# Patient Record
Sex: Female | Born: 1980 | Hispanic: Yes | Marital: Married | Smoking: Never smoker
Health system: Southern US, Community
[De-identification: ages and names within clinical notes are randomized; demographics above are authoritative.]

## PROBLEM LIST (undated history)

## (undated) ENCOUNTER — Inpatient Hospital Stay (HOSPITAL_COMMUNITY): Payer: Self-pay

## (undated) DIAGNOSIS — N926 Irregular menstruation, unspecified: Secondary | ICD-10-CM

## (undated) DIAGNOSIS — D649 Anemia, unspecified: Secondary | ICD-10-CM

## (undated) DIAGNOSIS — G510 Bell's palsy: Secondary | ICD-10-CM

## (undated) HISTORY — DX: Bell's palsy: G51.0

## (undated) HISTORY — DX: Irregular menstruation, unspecified: N92.6

## (undated) HISTORY — DX: Anemia, unspecified: D64.9

## (undated) HISTORY — PX: TOOTH EXTRACTION: SUR596

---

## 2004-08-29 ENCOUNTER — Inpatient Hospital Stay (HOSPITAL_COMMUNITY): Admission: AD | Admit: 2004-08-29 | Discharge: 2004-08-29 | Payer: Self-pay | Admitting: *Deleted

## 2004-09-17 ENCOUNTER — Ambulatory Visit: Payer: Self-pay | Admitting: Obstetrics & Gynecology

## 2005-04-21 ENCOUNTER — Ambulatory Visit: Payer: Self-pay | Admitting: Nurse Practitioner

## 2005-04-22 ENCOUNTER — Ambulatory Visit: Payer: Self-pay | Admitting: Nurse Practitioner

## 2005-04-23 ENCOUNTER — Encounter (INDEPENDENT_AMBULATORY_CARE_PROVIDER_SITE_OTHER): Payer: Self-pay | Admitting: Nurse Practitioner

## 2005-04-23 LAB — CONVERTED CEMR LAB
TSH: NORMAL microintl units/mL
WBC, blood: NORMAL 10*3/uL

## 2005-04-28 ENCOUNTER — Ambulatory Visit: Payer: Self-pay | Admitting: Nurse Practitioner

## 2005-05-01 ENCOUNTER — Encounter: Admission: RE | Admit: 2005-05-01 | Discharge: 2005-05-01 | Payer: Self-pay | Admitting: Family Medicine

## 2005-06-10 ENCOUNTER — Ambulatory Visit: Payer: Self-pay | Admitting: Nurse Practitioner

## 2005-06-18 ENCOUNTER — Ambulatory Visit: Payer: Self-pay | Admitting: *Deleted

## 2005-06-24 ENCOUNTER — Ambulatory Visit: Payer: Self-pay | Admitting: Nurse Practitioner

## 2005-07-14 ENCOUNTER — Ambulatory Visit: Payer: Self-pay | Admitting: Nurse Practitioner

## 2005-09-12 ENCOUNTER — Ambulatory Visit: Payer: Self-pay | Admitting: Nurse Practitioner

## 2005-09-16 ENCOUNTER — Ambulatory Visit (HOSPITAL_COMMUNITY): Admission: RE | Admit: 2005-09-16 | Discharge: 2005-09-16 | Payer: Self-pay | Admitting: Internal Medicine

## 2005-09-25 ENCOUNTER — Ambulatory Visit: Payer: Self-pay | Admitting: Nurse Practitioner

## 2005-11-04 ENCOUNTER — Ambulatory Visit: Payer: Self-pay | Admitting: Internal Medicine

## 2005-11-27 ENCOUNTER — Ambulatory Visit: Payer: Self-pay | Admitting: Nurse Practitioner

## 2005-12-31 ENCOUNTER — Ambulatory Visit: Payer: Self-pay | Admitting: Nurse Practitioner

## 2006-02-24 ENCOUNTER — Ambulatory Visit: Payer: Self-pay | Admitting: Family Medicine

## 2006-03-10 ENCOUNTER — Ambulatory Visit: Payer: Self-pay | Admitting: Family Medicine

## 2006-03-20 ENCOUNTER — Inpatient Hospital Stay (HOSPITAL_COMMUNITY): Admission: AD | Admit: 2006-03-20 | Discharge: 2006-03-20 | Payer: Self-pay | Admitting: Gynecology

## 2006-03-26 ENCOUNTER — Ambulatory Visit: Payer: Self-pay | Admitting: Nurse Practitioner

## 2006-04-26 ENCOUNTER — Inpatient Hospital Stay (HOSPITAL_COMMUNITY): Admission: AD | Admit: 2006-04-26 | Discharge: 2006-04-26 | Payer: Self-pay | Admitting: Obstetrics

## 2006-05-21 ENCOUNTER — Ambulatory Visit: Payer: Self-pay | Admitting: Nurse Practitioner

## 2006-09-01 ENCOUNTER — Ambulatory Visit: Payer: Self-pay | Admitting: Nurse Practitioner

## 2006-09-01 LAB — CONVERTED CEMR LAB: Urinalysis: NORMAL

## 2006-10-19 ENCOUNTER — Ambulatory Visit: Payer: Self-pay | Admitting: Internal Medicine

## 2006-12-15 ENCOUNTER — Encounter (INDEPENDENT_AMBULATORY_CARE_PROVIDER_SITE_OTHER): Payer: Self-pay | Admitting: Nurse Practitioner

## 2006-12-15 DIAGNOSIS — E781 Pure hyperglyceridemia: Secondary | ICD-10-CM

## 2007-01-27 ENCOUNTER — Encounter (INDEPENDENT_AMBULATORY_CARE_PROVIDER_SITE_OTHER): Payer: Self-pay | Admitting: *Deleted

## 2007-06-25 ENCOUNTER — Inpatient Hospital Stay (HOSPITAL_COMMUNITY): Admission: AD | Admit: 2007-06-25 | Discharge: 2007-06-25 | Payer: Self-pay | Admitting: Obstetrics

## 2007-06-26 ENCOUNTER — Inpatient Hospital Stay (HOSPITAL_COMMUNITY): Admission: AD | Admit: 2007-06-26 | Discharge: 2007-06-29 | Payer: Self-pay | Admitting: Obstetrics

## 2007-10-21 ENCOUNTER — Ambulatory Visit: Payer: Self-pay | Admitting: Internal Medicine

## 2007-12-13 ENCOUNTER — Ambulatory Visit: Payer: Self-pay | Admitting: Internal Medicine

## 2008-02-03 ENCOUNTER — Encounter: Payer: Self-pay | Admitting: Family Medicine

## 2008-02-03 ENCOUNTER — Ambulatory Visit: Payer: Self-pay | Admitting: Family Medicine

## 2008-02-03 LAB — CONVERTED CEMR LAB: Chlamydia, DNA Probe: NEGATIVE

## 2008-06-28 ENCOUNTER — Ambulatory Visit: Payer: Self-pay | Admitting: Internal Medicine

## 2008-08-02 ENCOUNTER — Encounter: Admission: RE | Admit: 2008-08-02 | Discharge: 2008-08-21 | Payer: Self-pay | Admitting: Family Medicine

## 2008-09-08 ENCOUNTER — Ambulatory Visit: Payer: Self-pay | Admitting: Internal Medicine

## 2008-11-10 ENCOUNTER — Ambulatory Visit: Payer: Self-pay | Admitting: Internal Medicine

## 2009-01-01 ENCOUNTER — Ambulatory Visit: Payer: Self-pay | Admitting: Internal Medicine

## 2009-08-10 ENCOUNTER — Ambulatory Visit: Payer: Self-pay | Admitting: Family

## 2009-08-10 ENCOUNTER — Inpatient Hospital Stay (HOSPITAL_COMMUNITY): Admission: AD | Admit: 2009-08-10 | Discharge: 2009-08-13 | Payer: Self-pay | Admitting: Obstetrics

## 2009-08-22 ENCOUNTER — Ambulatory Visit: Admission: RE | Admit: 2009-08-22 | Discharge: 2009-08-22 | Payer: Self-pay | Admitting: Obstetrics

## 2009-10-05 ENCOUNTER — Emergency Department (HOSPITAL_COMMUNITY): Admission: EM | Admit: 2009-10-05 | Discharge: 2009-10-05 | Payer: Self-pay | Admitting: Emergency Medicine

## 2010-07-14 ENCOUNTER — Emergency Department (HOSPITAL_COMMUNITY)
Admission: EM | Admit: 2010-07-14 | Discharge: 2010-07-14 | Disposition: A | Discharge status: Home / Self Care | Payer: Self-pay | Attending: Emergency Medicine | Admitting: Emergency Medicine

## 2010-07-14 DIAGNOSIS — R3915 Urgency of urination: Secondary | ICD-10-CM | POA: Insufficient documentation

## 2010-07-14 DIAGNOSIS — J40 Bronchitis, not specified as acute or chronic: Secondary | ICD-10-CM | POA: Insufficient documentation

## 2010-07-14 DIAGNOSIS — R059 Cough, unspecified: Secondary | ICD-10-CM | POA: Insufficient documentation

## 2010-07-14 DIAGNOSIS — R0982 Postnasal drip: Secondary | ICD-10-CM | POA: Insufficient documentation

## 2010-07-14 DIAGNOSIS — R000 Tachycardia, unspecified: Secondary | ICD-10-CM | POA: Insufficient documentation

## 2010-07-14 DIAGNOSIS — R509 Fever, unspecified: Secondary | ICD-10-CM | POA: Insufficient documentation

## 2010-07-14 DIAGNOSIS — R51 Headache: Secondary | ICD-10-CM | POA: Insufficient documentation

## 2010-07-14 DIAGNOSIS — R5383 Other fatigue: Secondary | ICD-10-CM | POA: Insufficient documentation

## 2010-07-14 DIAGNOSIS — J029 Acute pharyngitis, unspecified: Secondary | ICD-10-CM | POA: Insufficient documentation

## 2010-07-14 DIAGNOSIS — R109 Unspecified abdominal pain: Secondary | ICD-10-CM | POA: Insufficient documentation

## 2010-07-14 DIAGNOSIS — R05 Cough: Secondary | ICD-10-CM | POA: Insufficient documentation

## 2010-07-14 DIAGNOSIS — J3489 Other specified disorders of nose and nasal sinuses: Secondary | ICD-10-CM | POA: Insufficient documentation

## 2010-07-14 DIAGNOSIS — R5381 Other malaise: Secondary | ICD-10-CM | POA: Insufficient documentation

## 2010-07-14 DIAGNOSIS — R300 Dysuria: Secondary | ICD-10-CM | POA: Insufficient documentation

## 2010-07-14 DIAGNOSIS — R197 Diarrhea, unspecified: Secondary | ICD-10-CM | POA: Insufficient documentation

## 2010-07-14 LAB — URINALYSIS, ROUTINE W REFLEX MICROSCOPIC
Bilirubin Urine: NEGATIVE
Hgb urine dipstick: NEGATIVE
Protein, ur: NEGATIVE mg/dL
Specific Gravity, Urine: 1.014 (ref 1.005–1.030)
Urobilinogen, UA: 0.2 mg/dL (ref 0.0–1.0)

## 2010-07-14 LAB — URINE MICROSCOPIC-ADD ON

## 2010-07-29 LAB — POCT I-STAT, CHEM 8
HCT: 35 % — ABNORMAL LOW (ref 36.0–46.0)
Hemoglobin: 11.9 g/dL — ABNORMAL LOW (ref 12.0–15.0)
Potassium: 3.4 mEq/L — ABNORMAL LOW (ref 3.5–5.1)
Sodium: 141 mEq/L (ref 135–145)

## 2010-07-29 LAB — DIFFERENTIAL
Basophils Absolute: 0 10*3/uL (ref 0.0–0.1)
Basophils Relative: 0 % (ref 0–1)
Monocytes Relative: 7 % (ref 3–12)
Neutro Abs: 5.6 10*3/uL (ref 1.7–7.7)
Neutrophils Relative %: 62 % (ref 43–77)

## 2010-07-29 LAB — URINALYSIS, ROUTINE W REFLEX MICROSCOPIC
Bilirubin Urine: NEGATIVE
Nitrite: NEGATIVE
Specific Gravity, Urine: 1.012 (ref 1.005–1.030)
pH: 6 (ref 5.0–8.0)

## 2010-07-29 LAB — CBC
MCHC: 34.3 g/dL (ref 30.0–36.0)
Platelets: 281 10*3/uL (ref 150–400)
RBC: 4.15 MIL/uL (ref 3.87–5.11)
RDW: 16.5 % — ABNORMAL HIGH (ref 11.5–15.5)

## 2010-07-29 LAB — URINE MICROSCOPIC-ADD ON

## 2010-07-29 LAB — POCT PREGNANCY, URINE: Preg Test, Ur: NEGATIVE

## 2010-07-31 LAB — CBC
HCT: 28.3 % — ABNORMAL LOW (ref 36.0–46.0)
Hemoglobin: 11 g/dL — ABNORMAL LOW (ref 12.0–15.0)
MCV: 82.9 fL (ref 78.0–100.0)
RBC: 3.42 MIL/uL — ABNORMAL LOW (ref 3.87–5.11)
RBC: 4.02 MIL/uL (ref 3.87–5.11)
WBC: 12.1 10*3/uL — ABNORMAL HIGH (ref 4.0–10.5)
WBC: 12.5 10*3/uL — ABNORMAL HIGH (ref 4.0–10.5)

## 2010-07-31 LAB — RPR: RPR Ser Ql: NONREACTIVE

## 2010-08-05 ENCOUNTER — Encounter (INDEPENDENT_AMBULATORY_CARE_PROVIDER_SITE_OTHER): Payer: Self-pay | Admitting: *Deleted

## 2010-08-05 LAB — CONVERTED CEMR LAB
AST: 59 units/L — ABNORMAL HIGH (ref 0–37)
BUN: 9 mg/dL (ref 6–23)
Calcium: 8.6 mg/dL (ref 8.4–10.5)
Chloride: 109 meq/L (ref 96–112)
Creatinine, Ser: 0.46 mg/dL (ref 0.40–1.20)
Total Bilirubin: 0.4 mg/dL (ref 0.3–1.2)

## 2010-08-08 ENCOUNTER — Encounter: Payer: Self-pay | Admitting: Internal Medicine

## 2010-08-10 ENCOUNTER — Emergency Department (HOSPITAL_COMMUNITY)
Admission: EM | Admit: 2010-08-10 | Discharge: 2010-08-11 | Disposition: A | Discharge status: Home / Self Care | Payer: Self-pay | Attending: Emergency Medicine | Admitting: Emergency Medicine

## 2010-08-10 DIAGNOSIS — R109 Unspecified abdominal pain: Secondary | ICD-10-CM | POA: Insufficient documentation

## 2010-08-10 DIAGNOSIS — R110 Nausea: Secondary | ICD-10-CM | POA: Insufficient documentation

## 2010-08-10 DIAGNOSIS — E669 Obesity, unspecified: Secondary | ICD-10-CM | POA: Insufficient documentation

## 2010-08-10 DIAGNOSIS — R197 Diarrhea, unspecified: Secondary | ICD-10-CM | POA: Insufficient documentation

## 2010-08-10 LAB — URINALYSIS, ROUTINE W REFLEX MICROSCOPIC
Bilirubin Urine: NEGATIVE
Hgb urine dipstick: NEGATIVE
Nitrite: NEGATIVE
Specific Gravity, Urine: 1.025 (ref 1.005–1.030)
pH: 6.5 (ref 5.0–8.0)

## 2010-08-10 LAB — URINE MICROSCOPIC-ADD ON

## 2010-08-10 LAB — DIFFERENTIAL
Eosinophils Absolute: 0.4 10*3/uL (ref 0.0–0.7)
Eosinophils Relative: 4 % (ref 0–5)
Lymphocytes Relative: 30 % (ref 12–46)
Lymphs Abs: 3.4 10*3/uL (ref 0.7–4.0)
Monocytes Absolute: 0.7 10*3/uL (ref 0.1–1.0)
Monocytes Relative: 6 % (ref 3–12)

## 2010-08-11 LAB — CBC
HCT: 36.8 % (ref 36.0–46.0)
MCH: 27.8 pg (ref 26.0–34.0)
MCHC: 33.4 g/dL (ref 30.0–36.0)
MCV: 83.3 fL (ref 78.0–100.0)
Platelets: 264 10*3/uL (ref 150–400)
RDW: 13.8 % (ref 11.5–15.5)
WBC: 11.3 10*3/uL — ABNORMAL HIGH (ref 4.0–10.5)

## 2010-08-11 LAB — POCT I-STAT, CHEM 8
Calcium, Ion: 1.09 mmol/L — ABNORMAL LOW (ref 1.12–1.32)
Chloride: 106 mEq/L (ref 96–112)
Creatinine, Ser: 0.5 mg/dL (ref 0.4–1.2)
Glucose, Bld: 94 mg/dL (ref 70–99)
Potassium: 3.5 mEq/L (ref 3.5–5.1)

## 2010-09-27 NOTE — Group Therapy Note (Signed)
NAME:  Kari Ortega, Kari Ortega NO.:  [id number]   MEDICAL RECORD NO.:  [id number]          PATIENT TYPE:  WOC   LOCATION:  [facility]                   FACILITY:  [facility]   PHYSICIAN:  Elsie Lincoln, MD      DATE OF BIRTH:  21-Oct-1980   DATE OF SERVICE:  09/17/2004                                    CLINIC NOTE   REASON FOR VISIT:  The patient is a 30 year old G0 with LMP April 04, 2004 who presents for follow-up of MAU visit. The patient was there mostly  for a viral syndrome and complaining of breast discharge. They happened to  diagnosis that she had not had a period since April 04, 2004 although she  had received Depo on April 05, 2004. They did do a transvaginal  ultrasound due to some abdominal pain which showed evidence of PCO. The  endometrial stripe was thin, all labs were normal, she was not pregnant.  Today the patient complains of daily nipple discharge that is white, looking  like breast milk. She also complains of various and sundry other complaints  including headache, dizziness, a cough last week, and sharp back pain last  night that radiated to her legs. We will workup for a prolactinoma but all  these other general medical complaints she was instructed to find a general  medicine physician to handle these. The patient states that her menses have  always been irregular, every 1 to 2-and-a-half months since menarche at age  78. However, she has been amenorrheic as stated above since the Depo-Provera  April 05, 2004. She is sexually active with her husband and does not use  contraception and desires pregnancy.   PAST MEDICAL HISTORY:  Denies.   PAST SURGICAL HISTORY:  Age 28 had a mini-laparotomy due to a blood clot  being in her belly. This happened in [place] so we are unsure exactly what  happened.   PAST GYNECOLOGICAL HISTORY:  Has never had a Pap smear and denies any other  problems except as above.   FAMILY HISTORY:  No cancers.   PHYSICAL EXAMINATION:  GENERAL:  Well-nourished, well-developed, no apparent  distress.  VITAL SIGNS:  Temperature 97.9, pulse 70, blood pressure 114/78, weight  128.5.  BREASTS:  Positive discharge on deep palpation, no masses.  ABDOMEN:  Soft, nontender, slightly obese.  HEENT:  Positive hirsutism above upper lip.  PELVIC:  Genitalia:  Tanner V. Vagina:  Pink, no lesions. Cervix:  Nulliparous, closed, nontender. Uterus:  Retroverted, nontender. Adnexa:  Ovaries palpated, nontender.   ASSESSMENT AND PLAN:  A 30 year old female with amenorrhea and  oligomenorrhea.   1.  UPT today.  2.  Fasting 17-hydroxyprogesterone and glucose.  3.  Prolactin and TSH levels.  4.  Pap smear and cultures today.  5.  The patient encouraged to take a multivitamin with 400 mcg of folic acid      daily.  6.  Return to clinic in a month for results.      KL/MEDQ  D:  09/17/2004  T:  09/17/2004  Job:  [id number]

## 2010-10-25 ENCOUNTER — Other Ambulatory Visit: Payer: Self-pay | Admitting: Family Medicine

## 2011-01-31 LAB — CBC
HCT: 24.1 — ABNORMAL LOW
HCT: 32.9 — ABNORMAL LOW
Hemoglobin: 11.4 — ABNORMAL LOW
MCHC: 34.4
MCHC: 34.6
MCV: 83.6
Platelets: 303
RBC: 2.89 — ABNORMAL LOW
RBC: 3.98
WBC: 15.3 — ABNORMAL HIGH

## 2016-07-01 ENCOUNTER — Inpatient Hospital Stay (HOSPITAL_COMMUNITY)
Admission: AD | Admit: 2016-07-01 | Discharge: 2016-07-01 | Disposition: A | Discharge status: Home / Self Care | Payer: Self-pay | Source: Ambulatory Visit | Attending: Family Medicine | Admitting: Family Medicine

## 2016-07-01 ENCOUNTER — Encounter (HOSPITAL_COMMUNITY): Payer: Self-pay

## 2016-07-01 ENCOUNTER — Inpatient Hospital Stay (HOSPITAL_COMMUNITY): Payer: Self-pay

## 2016-07-01 DIAGNOSIS — O26851 Spotting complicating pregnancy, first trimester: Secondary | ICD-10-CM

## 2016-07-01 DIAGNOSIS — O209 Hemorrhage in early pregnancy, unspecified: Secondary | ICD-10-CM | POA: Insufficient documentation

## 2016-07-01 DIAGNOSIS — R1032 Left lower quadrant pain: Secondary | ICD-10-CM

## 2016-07-01 DIAGNOSIS — O3680X0 Pregnancy with inconclusive fetal viability, not applicable or unspecified: Secondary | ICD-10-CM

## 2016-07-01 DIAGNOSIS — Z3A10 10 weeks gestation of pregnancy: Secondary | ICD-10-CM | POA: Insufficient documentation

## 2016-07-01 LAB — CBC WITH DIFFERENTIAL/PLATELET
Basophils Absolute: 0 10*3/uL (ref 0.0–0.1)
Basophils Relative: 0 %
EOS PCT: 2 %
Eosinophils Absolute: 0.2 10*3/uL (ref 0.0–0.7)
HCT: 35.6 % — ABNORMAL LOW (ref 36.0–46.0)
Hemoglobin: 12.1 g/dL (ref 12.0–15.0)
LYMPHS ABS: 2.7 10*3/uL (ref 0.7–4.0)
LYMPHS PCT: 25 %
MCH: 27.9 pg (ref 26.0–34.0)
MCHC: 34 g/dL (ref 30.0–36.0)
MCV: 82.2 fL (ref 78.0–100.0)
MONO ABS: 0.5 10*3/uL (ref 0.1–1.0)
MONOS PCT: 4 %
Neutro Abs: 7.6 10*3/uL (ref 1.7–7.7)
Neutrophils Relative %: 69 %
PLATELETS: 290 10*3/uL (ref 150–400)
RBC: 4.33 MIL/uL (ref 3.87–5.11)
RDW: 14.5 % (ref 11.5–15.5)
WBC: 11 10*3/uL — ABNORMAL HIGH (ref 4.0–10.5)

## 2016-07-01 LAB — WET PREP, GENITAL
Clue Cells Wet Prep HPF POC: NONE SEEN
SPERM: NONE SEEN
TRICH WET PREP: NONE SEEN
YEAST WET PREP: NONE SEEN

## 2016-07-01 LAB — ABO/RH: ABO/RH(D): O POS

## 2016-07-01 LAB — HCG, QUANTITATIVE, PREGNANCY: hCG, Beta Chain, Quant, S: 15479 m[IU]/mL — ABNORMAL HIGH (ref <5)

## 2016-07-01 LAB — POCT PREGNANCY, URINE: PREG TEST UR: POSITIVE — AB

## 2016-07-01 NOTE — MAU Note (Signed)
Pt presents to MAU with vaginal bleeding, lower abdominal and lower back pain that started Sunday. PT notices bleeding with wiping and "very small clots." Denies any urinary symptoms.

## 2016-07-01 NOTE — MAU Provider Note (Signed)
History     CSN: [id number]  Arrival date and time: 07/01/16 [id number]   First Provider Initiated Contact with Patient 07/01/16 [phone]      Chief Complaint  Patient presents with  . Vaginal Bleeding   HPI Ms. Kari Ortega is a 36 y.o. G4P0 at [redacted]w[redacted]d who presents to MAU today with complaint of vaginal bleeding since Sunday. The patient states only noted with wiping, she has not needed to wear a pad. She also has LLQ abdominal pain and low back pain rated at 6/10 now. She has tried Tylenol for pain. She denies vaginal discharge, N/V/D, constipation or UTI symptoms.   OB History    Gravida Para Term Preterm AB Living   4         2   SAB TAB Ectopic Multiple Live Births                  Past Medical History:  Diagnosis Date  . Medical history non-contributory     Past Surgical History:  Procedure Laterality Date  . NO PAST SURGERIES      No family history on file.  Social History  Substance Use Topics  . Smoking status: Never Smoker  . Smokeless tobacco: Never Used  . Alcohol use No    Allergies: Allergies not on file  No prescriptions prior to admission.    Review of Systems  Constitutional: Negative for fever.  Gastrointestinal: Positive for abdominal pain. Negative for constipation, diarrhea, nausea and vomiting.  Genitourinary: Positive for vaginal bleeding. Negative for dysuria, frequency, urgency and vaginal discharge.   Physical Exam   Blood pressure 103/60, pulse 71, temperature 98.7 F (37.1 C), resp. rate 18, last menstrual period 04/22/2016.  Physical Exam  Nursing note and vitals reviewed. Constitutional: She is oriented to person, place, and time. She appears well-developed and well-nourished. No distress.  HENT:  Head: Normocephalic and atraumatic.  Cardiovascular: Normal rate.   Respiratory: Effort normal.  GI: Soft. She exhibits no distension and no mass. There is tenderness (mild tenderness to palpation of the LLQ). There is no  rebound and no guarding.  Genitourinary: Uterus is enlarged (slightly). Uterus is not tender. Cervix exhibits no motion tenderness, no discharge and no friability. Right adnexum displays no mass and no tenderness. Left adnexum displays no mass and no tenderness. There is bleeding (scant) in the vagina. No vaginal discharge found.  Neurological: She is alert and oriented to person, place, and time.  Skin: Skin is warm and dry. No erythema.  Psychiatric: She has a normal mood and affect.    Results for orders placed or performed during the hospital encounter of 07/01/16 (from the past 24 hour(s))  Pregnancy, urine POC     Status: Abnormal   Collection Time: 07/01/16  9:13 AM  Result Value Ref Range   Preg Test, Ur POSITIVE (A) NEGATIVE  Wet prep, genital     Status: Abnormal   Collection Time: 07/01/16  9:40 AM  Result Value Ref Range   Yeast Wet Prep HPF POC NONE SEEN NONE SEEN   Trich, Wet Prep NONE SEEN NONE SEEN   Clue Cells Wet Prep HPF POC NONE SEEN NONE SEEN   WBC, Wet Prep HPF POC FEW (A) NONE SEEN   Sperm NONE SEEN   CBC with Differential/Platelet     Status: Abnormal   Collection Time: 07/01/16  9:48 AM  Result Value Ref Range   WBC 11.0 (H) 4.0 - 10.5 K/uL   RBC 4.33  3.87 - 5.11 MIL/uL   Hemoglobin 12.1 12.0 - 15.0 g/dL   HCT 35.6 (L) 36.0 - 46.0 %   MCV 82.2 78.0 - 100.0 fL   MCH 27.9 26.0 - 34.0 pg   MCHC 34.0 30.0 - 36.0 g/dL   RDW 14.5 11.5 - 15.5 %   Platelets 290 150 - 400 K/uL   Neutrophils Relative % 69 %   Neutro Abs 7.6 1.7 - 7.7 K/uL   Lymphocytes Relative 25 %   Lymphs Abs 2.7 0.7 - 4.0 K/uL   Monocytes Relative 4 %   Monocytes Absolute 0.5 0.1 - 1.0 K/uL   Eosinophils Relative 2 %   Eosinophils Absolute 0.2 0.0 - 0.7 K/uL   Basophils Relative 0 %   Basophils Absolute 0.0 0.0 - 0.1 K/uL  ABO/Rh     Status: None   Collection Time: 07/01/16  9:48 AM  Result Value Ref Range   ABO/RH(D) O POS   hCG, quantitative, pregnancy     Status: Abnormal    Collection Time: 07/01/16  9:48 AM  Result Value Ref Range   hCG, Beta Chain, Quant, S 15,479 (H) <5 mIU/mL   US Ob Comp Less 14 Wks  Result Date: 07/01/2016 CLINICAL DATA:  Vaginal bleeding.  First trimester pregnancy. EXAM: OBSTETRIC <14 WK [place] AND TRANSVAGINAL OB US TECHNIQUE: Both transabdominal and transvaginal ultrasound examinations were performed for complete evaluation of the gestation as well as the maternal uterus, adnexal regions, and pelvic cul-de-sac. Transvaginal technique was performed to assess early pregnancy. COMPARISON:  12/16/7 FINDINGS: Intrauterine gestational sac: Single Yolk sac:  Not Visualized. Embryo:  Not Visualized. Cardiac Activity: Not Visualized. MSD: 17.9  mm   6 w   4  d Subchorionic hemorrhage:  Small measuring 2.2 x 0.8 x 2.2 cm Maternal uterus/adnexae: Right ovary: Normal Left ovary: Normal Other :None Free fluid:  None IMPRESSION: 1. Probable early intrauterine gestational sac, but no yolk sac, fetal pole, or cardiac activity yet visualized. Recommend follow-up quantitative B-HCG levels and follow-up US in 14 days to assess viability. This recommendation follows SRU consensus guidelines: Diagnostic Criteria for Nonviable Pregnancy Early in the First Trimester. [place] Med [id number]. 2. Small subchorionic hemorrhage. Electronically Signed   By: Kerby Moors M.D.   On: 07/01/2016 11:16   US Ob Transvaginal  Result Date: 07/01/2016 CLINICAL DATA:  Vaginal bleeding.  First trimester pregnancy. EXAM: OBSTETRIC <14 WK [place] AND TRANSVAGINAL OB US TECHNIQUE: Both transabdominal and transvaginal ultrasound examinations were performed for complete evaluation of the gestation as well as the maternal uterus, adnexal regions, and pelvic cul-de-sac. Transvaginal technique was performed to assess early pregnancy. COMPARISON:  12/16/7 FINDINGS: Intrauterine gestational sac: Single Yolk sac:  Not Visualized. Embryo:  Not Visualized. Cardiac Activity: Not Visualized. MSD: 17.9   mm   6 w   4  d Subchorionic hemorrhage:  Small measuring 2.2 x 0.8 x 2.2 cm Maternal uterus/adnexae: Right ovary: Normal Left ovary: Normal Other :None Free fluid:  None IMPRESSION: 1. Probable early intrauterine gestational sac, but no yolk sac, fetal pole, or cardiac activity yet visualized. Recommend follow-up quantitative B-HCG levels and follow-up US in 14 days to assess viability. This recommendation follows SRU consensus guidelines: Diagnostic Criteria for Nonviable Pregnancy Early in the First Trimester. [place] Med [id number]. 2. Small subchorionic hemorrhage. Electronically Signed   By: Kerby Moors M.D.   On: 07/01/2016 11:16    MAU Course  Procedures None  MDM Unable to obtain  FHTs with doppler +UPT UA, wet prep, GC/chlamydia, CBC, ABO/Rh, quant hCG, HIV and [place] today to rule out ectopic pregnancy Based on [place] and lab results, this is likely a blighted ovum. Patient given strict precautions, but will follow-up Thursday for 48 repeat hCG.  Assessment and Plan  A: Pregnancy of unknown location Vaginal bleeding in pregnancy  P: Discharge home Bleeding/ectopic precautions discussed Patient advised to follow-up with [facility] at 11:00 am on Thursday for repeat labs Patient may return to MAU as needed or if her condition were to change or worsen  Luvenia Redden, PA-C  07/01/2016, 11:39 AM

## 2016-07-01 NOTE — Discharge Instructions (Signed)
Hemorragia vaginal durante el embarazo (primer trimestre) (Vaginal Bleeding During Pregnancy, First Trimester) Durante los primeros meses de Jansen, es comn tener una pequea hemorragia vaginal (manchas). A veces, la hemorragia es normal y no representa un problema, pero en algunas ocasiones es un sntoma de algo grave. Asegrese de decirle a su mdico de inmediato si tiene algn tipo de hemorragia vaginal. CUIDADOS EN EL HOGAR  Controle su afeccin para ver si hay cambios.  Siga las indicaciones de su mdico con respecto al [place] de actividad que [place].  Si debe hacer reposo en cama:  Es posible que deba quedarse en cama y levantarse nicamente para ir al bao.  Quizs le permitan hacer [employer].  Si es necesario, planifique que alguien la ayude.  Alla [place]:  La cantidad de toallas higinicas que [place] cada da.  La frecuencia con la que se cambia las toallas higinicas.  Indique que tan empapados (saturados) estn.  No use tampones.  No se haga duchas vaginales.  No tenga relaciones sexuales ni orgasmos hasta que el mdico la autorice.  Si elimina tejido por la vagina, gurdelo para mostrrselo al [employer].  Tome los medicamentos solamente como se lo haya indicado el mdico.  No tome aspirina, ya que puede causar hemorragias.  Concurra a todas las visitas de control como se lo haya indicado el mdico. SOLICITE AYUDA SI:  Tiene una hemorragia vaginal.  Tiene clicos.  Tiene dolores de [place].  Tiene fiebre que no desaparece despus de Geophysical data processor. SOLICITE AYUDA DE INMEDIATO SI:  Siente clicos muy intensos en la espalda o en el vientre (abdomen).  Elimina cogulos grandes o tejido por la vagina.  Tiene ms hemorragia.  Se siente dbil o que va a desvanecerse.  Pierde el conocimiento (se desmaya).  Tiene escalofros.  Tiene una prdida importante o sale lquido a borbotones por la vagina.  Se desmaya mientras defeca. ASEGRESE DE  QUE:  Comprende estas instrucciones.  Controlar su afeccin.  Recibir ayuda de inmediato si no mejora o si empeora. Esta informacin no tiene Marine scientist el consejo del mdico. Asegrese de hacerle al mdico cualquier pregunta que tenga. Document Released: 09/12/2013 Document Revised: 09/12/2013 Document Reviewed: 01/03/2013 Elsevier Interactive Patient Education  2017 [employer].

## 2016-07-01 NOTE — MAU Note (Signed)
Urine sent to lab 

## 2016-07-02 ENCOUNTER — Encounter (HOSPITAL_COMMUNITY): Payer: Self-pay | Admitting: *Deleted

## 2016-07-02 ENCOUNTER — Inpatient Hospital Stay (HOSPITAL_COMMUNITY)
Admission: AD | Admit: 2016-07-02 | Discharge: 2016-07-02 | Disposition: A | Discharge status: Home / Self Care | Payer: Self-pay | Source: Ambulatory Visit | Attending: Family Medicine | Admitting: Family Medicine

## 2016-07-02 DIAGNOSIS — Z3A10 10 weeks gestation of pregnancy: Secondary | ICD-10-CM | POA: Insufficient documentation

## 2016-07-02 DIAGNOSIS — O200 Threatened abortion: Secondary | ICD-10-CM | POA: Insufficient documentation

## 2016-07-02 LAB — URINALYSIS, ROUTINE W REFLEX MICROSCOPIC
Bacteria, UA: NONE SEEN
Bilirubin Urine: NEGATIVE
Glucose, UA: NEGATIVE mg/dL
KETONES UR: NEGATIVE mg/dL
NITRITE: NEGATIVE
PROTEIN: 100 mg/dL — AB
Specific Gravity, Urine: 1.024 (ref 1.005–1.030)
pH: 5 (ref 5.0–8.0)

## 2016-07-02 LAB — CBC
HCT: 35.4 % — ABNORMAL LOW (ref 36.0–46.0)
HEMOGLOBIN: 12 g/dL (ref 12.0–15.0)
MCH: 27.8 pg (ref 26.0–34.0)
MCHC: 33.9 g/dL (ref 30.0–36.0)
MCV: 82.1 fL (ref 78.0–100.0)
Platelets: 278 10*3/uL (ref 150–400)
RBC: 4.31 MIL/uL (ref 3.87–5.11)
RDW: 14.4 % (ref 11.5–15.5)
WBC: 12.7 10*3/uL — AB (ref 4.0–10.5)

## 2016-07-02 LAB — GC/CHLAMYDIA PROBE AMP (~~LOC~~) NOT AT ARMC
Chlamydia: NEGATIVE
Neisseria Gonorrhea: NEGATIVE

## 2016-07-02 LAB — HCG, QUANTITATIVE, PREGNANCY: hCG, Beta Chain, Quant, S: 13878 m[IU]/mL — ABNORMAL HIGH (ref <5)

## 2016-07-02 LAB — HIV ANTIBODY (ROUTINE TESTING W REFLEX): HIV SCREEN 4TH GENERATION: NONREACTIVE

## 2016-07-02 MED ORDER — OXYCODONE-ACETAMINOPHEN 5-325 MG PO TABS: 1.0000 | ORAL_TABLET | Freq: Four times a day (QID) | ORAL | 0 refills | Status: DC | PRN

## 2016-07-02 MED ORDER — OXYCODONE-ACETAMINOPHEN 5-325 MG PO TABS
2.0000 | ORAL_TABLET | Freq: Once | ORAL | Status: AC
  Administered 2016-07-02: 2 via ORAL
  Filled 2016-07-02: qty 2

## 2016-07-02 NOTE — MAU Provider Note (Signed)
History     CSN: [id number]  Arrival date and time: 07/02/16 0130   First Provider Initiated Contact with Patient 07/02/16 [phone]        Chief Complaint  Patient presents with  . Abdominal Pain  . Vaginal Bleeding   HPI Kari Ortega is a 36 y.o. [id number] at [redacted]w[redacted]d who presents with vaginal bleeding & abdominal pain. Was seen in MAU yesterday for vaginal bleeding. Ultrasound shows enlarge gestational sac without yolk sac; was told to f/u at [facility] Thursday morning for repeat BHCG.  Abdominal pain started at 9 pm. Describes lower abdominal cramping that she rates 9/10. Has taken tylenol without relief. Vaginal bleeding is dark red & notices when she wipes. Has not saturated pads. Passed a few small clots.    Spanish phone interpreter used.    OB History    Gravida Para Term Preterm AB Living   4 2 2   1 2    SAB TAB Ectopic Multiple Live Births   1       2      Past Medical History:  Diagnosis Date  . Medical history non-contributory     Past Surgical History:  Procedure Laterality Date  . NO PAST SURGERIES      History reviewed. No pertinent family history.  Social History  Substance Use Topics  . Smoking status: Never Smoker  . Smokeless tobacco: Never Used  . Alcohol use No    Allergies: No Known Allergies  No prescriptions prior to admission.    Review of Systems  Constitutional: Negative.   Gastrointestinal: Positive for abdominal pain. Negative for constipation, diarrhea, nausea and vomiting.  Genitourinary: Positive for vaginal bleeding.   Physical Exam   Blood pressure 106/61, pulse 79, temperature 98.7 F (37.1 C), temperature source Oral, resp. rate 18, height 4\' 10"  (1.473 m), weight 151 lb (68.5 kg), last menstrual period 04/22/2016.  Physical Exam  Nursing note and vitals reviewed. Constitutional: She is oriented to person, place, and time. She appears well-developed and well-nourished. No distress.  HENT:  Head: Normocephalic  and atraumatic.  Eyes: Conjunctivae are normal. Right eye exhibits no discharge. Left eye exhibits no discharge. No scleral icterus.  Neck: Normal range of motion.  Cardiovascular: Normal rate, regular rhythm and normal heart sounds.   No murmur heard. Respiratory: Effort normal and breath sounds normal. No respiratory distress. She has no wheezes.  GI: Soft. Bowel sounds are normal. She exhibits no distension. There is no tenderness. There is no rebound and no guarding.  Genitourinary: There is bleeding (small amount of dark red blood & 1 small clot; Cervic dilated 1 cm) in the vagina.  Neurological: She is alert and oriented to person, place, and time.  Skin: Skin is warm and dry. She is not diaphoretic.  Psychiatric: She has a normal mood and affect. Her behavior is normal. Judgment and thought content normal.    MAU Course  Procedures Results for orders placed or performed during the hospital encounter of 07/02/16 (from the past 24 hour(s))  Urinalysis, Routine w reflex microscopic     Status: Abnormal   Collection Time: 07/02/16  1:47 AM  Result Value Ref Range   Color, Urine AMBER (A) YELLOW   APPearance CLOUDY (A) CLEAR   Specific Gravity, Urine 1.024 1.005 - 1.030   pH 5.0 5.0 - 8.0   Glucose, UA NEGATIVE NEGATIVE mg/dL   Hgb urine dipstick LARGE (A) NEGATIVE   Bilirubin Urine NEGATIVE NEGATIVE   Ketones, ur  NEGATIVE NEGATIVE mg/dL   Protein, ur 100 (A) NEGATIVE mg/dL   Nitrite NEGATIVE NEGATIVE   Leukocytes, UA TRACE (A) NEGATIVE   RBC / HPF TOO NUMEROUS TO COUNT 0 - 5 RBC/hpf   WBC, UA TOO NUMEROUS TO COUNT 0 - 5 WBC/hpf   Bacteria, UA NONE SEEN NONE SEEN   Squamous Epithelial / LPF 0-5 (A) NONE SEEN   Non Squamous Epithelial 0-5 (A) NONE SEEN  CBC     Status: Abnormal   Collection Time: 07/02/16  2:14 AM  Result Value Ref Range   WBC 12.7 (H) 4.0 - 10.5 K/uL   RBC 4.31 3.87 - 5.11 MIL/uL   Hemoglobin 12.0 12.0 - 15.0 g/dL   HCT 35.4 (L) 36.0 - 46.0 %   MCV 82.1  78.0 - 100.0 fL   MCH 27.8 26.0 - 34.0 pg   MCHC 33.9 30.0 - 36.0 g/dL   RDW 14.4 11.5 - 15.5 %   Platelets 278 150 - 400 K/uL  hCG, quantitative, pregnancy     Status: Abnormal   Collection Time: 07/02/16  2:14 AM  Result Value Ref Range   hCG, Beta Chain, Quant, S 13,878 (H) <5 mIU/mL    MDM O positive CBC -- hemoglobin stable BHCG dropped from yesterday [phone] Discussed results with patient via Spanish video interpreter. This is likely a miscarriage d/t dropping BHCG & dilated cervix. Patient appropriately upset. Declines chaplain services at this time Pain improved after percocet 2 tabs  Assessment and Plan  A: 1. Threatened miscarriage    P: Discharge home Pelvic rest Rx percocet Discussed reasons to return to MAU Go to [facility] Friday morning for [place] 07/02/2016, 2:07 AM

## 2016-07-02 NOTE — MAU Note (Addendum)
PT  SAYS SHE   HAS  ABD  PAIN THAT  STARTED  9PM-    WITHH  VAG  BLEEDING   WHEN  SHE  WIPES  - STARTED ON Sunday  .      WAS HERE YESTERDAY  FOR  ABD   PAIN.         TOOK  1 TAB REG   TYLENOL  AT 3 PM

## 2016-07-02 NOTE — Discharge Instructions (Signed)
Amenaza de aborto °(Threatened Miscarriage) °La amenaza de aborto se produce cuando hay hemorragia vaginal durante las primeras 20 semanas de embarazo, pero el embarazo no se interrumpe. Si durante este período usted tiene hemorragia vaginal, el médico le hará pruebas para asegurarse de que el embarazo continúe. Si las pruebas muestran que usted continúa embarazada y que el "bebé" en desarrollo (feto) dentro del útero sigue creciendo, se considera que tuvo una amenaza de aborto. °La amenaza de aborto no implica que el embarazo vaya a terminar, pero sí aumenta el riesgo de perder el embarazo (aborto completo). °CAUSAS °Por lo general, no se conoce la causa de la amenaza de aborto. Si el resultado final es el aborto completo, la causa más frecuente es la cantidad anormal de cromosomas del feto. Los cromosomas son las estructuras internas de las células que contienen todo el material genético. °Algunas de las causas de hemorragia vaginal que no ocasionan un aborto incluyen: °· Las relaciones sexuales. °· Las infecciones. °· Los cambios hormonales normales durante el embarazo. °· La hemorragia que se produce cuando el óvulo se implanta en el útero. °FACTORES DE RIESGO °Los factores de riesgo de hemorragia al principio del embarazo incluyen: °· Obesidad. °· Fumar. °· El consumo de cantidades excesivas de alcohol o cafeína. °· El consumo de drogas. °SIGNOS Y SÍNTOMAS °· Hemorragia vaginal leve. °· Dolor o cólicos abdominales leves. °DIAGNÓSTICO °Si tiene hemorragia con o sin dolor abdominal antes de las 20 semanas de embarazo, el médico le hará pruebas para determinar si el embarazo continúa. Una prueba importante incluye el uso de ondas sonoras y de una computadora (ecografía) para crear imágenes del interior del útero. Otras pruebas incluyen el examen interno de la vagina y el útero (examen pélvico), y el control de la frecuencia cardíaca del feto. °Es posible que le diagnostiquen una amenaza de aborto en los siguientes  casos: °· La ecografía muestra que el embarazo continúa. °· La frecuencia cardíaca del feto es alta. °· El examen pélvico muestra que la apertura entre el útero y la vagina (cuello del útero) está cerrada. °· Su frecuencia cardíaca y su presión arterial están estables. °· Los análisis de sangre confirman que el embarazo continúa. °TRATAMIENTO °No se ha demostrado que ningún tratamiento evite que una amenaza de aborto se convierta en un aborto completo. Sin embargo, los cuidados adecuados en el hogar son importantes. °INSTRUCCIONES PARA EL CUIDADO EN EL HOGAR °· Asegúrese de asistir a todas las citas de cuidados prenatales. Esto es muy importante. °· Descanse lo suficiente. °· No tenga relaciones sexuales ni use tampones si tiene hemorragia vaginal. °· No se haga duchas vaginales. °· No fume ni consuma drogas. °· No beba alcohol. °· Evite la cafeína. °SOLICITE ATENCIÓN MÉDICA SI: °· Tiene una ligera hemorragia o manchado vaginal durante el embarazo. °· Tiene dolor o cólicos en el abdomen. °· Tiene fiebre. °SOLICITE ATENCIÓN MÉDICA DE INMEDIATO SI: °· Tiene una hemorragia vaginal abundante. °· Elimina coágulos de sangre por la vagina. °· Siente dolor en la parte baja de la espalda o cólicos abdominales intensos. °· Tiene fiebre, escalofríos y dolor abdominal intenso. °ASEGÚRESE DE QUE: °· Comprende estas instrucciones. °· Controlará su afección. °· Recibirá ayuda de inmediato si no mejora o si empeora. °Esta información no tiene como fin reemplazar el consejo del médico. Asegúrese de hacerle al médico cualquier pregunta que tenga. °Document Released: 02/05/2005 Document Revised: 05/03/2013 Document Reviewed: 02/22/2013 °Elsevier Interactive Patient Education © 2017 Elsevier Inc. ° °

## 2016-07-04 ENCOUNTER — Ambulatory Visit: Payer: Self-pay

## 2016-07-04 ENCOUNTER — Telehealth: Payer: Self-pay

## 2016-07-04 DIAGNOSIS — O3680X0 Pregnancy with inconclusive fetal viability, not applicable or unspecified: Secondary | ICD-10-CM

## 2016-07-04 LAB — HCG, QUANTITATIVE, PREGNANCY: hCG, Beta Chain, Quant, S: 2686 m[IU]/mL — ABNORMAL HIGH (ref <5)

## 2016-07-04 NOTE — Telephone Encounter (Signed)
Per Dr.Arnold patient needs a follow up appointment for Monday 07/07/2016 to discuss results. I have called patient and made her aware that our front office will be calling her regarding scheduling an appointment.

## 2016-07-04 NOTE — Progress Notes (Signed)
Patient presented to office today for a STAT BHCG. Patient reports some bleeding at this time. Patient was advised to wait for her results so that we can determine her next plan of care. Per Dr.Arnold patient needs a follow up in our office next week to discuss the next step. I have advised patient we will be calling her back for appointment on 07/07/2016. Patient voice understanding.

## 2016-07-07 ENCOUNTER — Telehealth: Payer: Self-pay

## 2016-07-07 ENCOUNTER — Ambulatory Visit (INDEPENDENT_AMBULATORY_CARE_PROVIDER_SITE_OTHER): Payer: Self-pay | Admitting: Family Medicine

## 2016-07-07 ENCOUNTER — Encounter: Payer: Self-pay | Admitting: Family Medicine

## 2016-07-07 VITALS — BP 124/80 | HR 66 | Ht <= 58 in | Wt 151.5 lb

## 2016-07-07 DIAGNOSIS — O039 Complete or unspecified spontaneous abortion without complication: Secondary | ICD-10-CM

## 2016-07-07 NOTE — Progress Notes (Signed)
   Subjective:    Patient ID: Kari Ortega, female    DOB: 1980-06-05, 36 y.o.   MRN: [id number]  HPI Patient seen for follow up of HCG. Her quants have decreased from 15000 to 2600. She reports passing clots and tissue a few days ago and yesterday. Spotting now. Still has a little bit of cramping. No fevers, chills, nausea, vomiting.   Review of Systems     Objective:   Physical Exam  Constitutional: She is oriented to person, place, and time. She appears well-developed and well-nourished.  Cardiovascular: Normal rate and regular rhythm.   Pulmonary/Chest: Effort normal and breath sounds normal. No respiratory distress. She has no wheezes. She has no rales.  Abdominal: Soft. There is tenderness (lower left quantrant tenderness). There is no rebound and no guarding.  Neurological: She is oriented to person, place, and time.  Psychiatric: She has a normal mood and affect. Her behavior is normal. Judgment and thought content normal.      Assessment & Plan:  1. Spontaneous miscarriage Will repeat quant. If sufficiently decreased, can discontinue.  - B-HCG Quant

## 2016-07-07 NOTE — Addendum Note (Signed)
Addended by: Christiana Pellant A on: 07/07/2016 02:38 PM   Modules accepted: Orders

## 2016-07-08 LAB — BETA HCG QUANT (REF LAB): HCG QUANT: 331 m[IU]/mL

## 2016-07-08 NOTE — Telephone Encounter (Signed)
Spoke with patient using spanish interpreter. She has ben notified of test results.

## 2016-07-08 NOTE — Telephone Encounter (Signed)
-----   Message from Truett Mainland, DO sent at 07/08/2016  9:31 AM EST ----- Sufficient drop in HCG. Follow up needed in 2 months if she doesn't get period.

## 2016-07-15 ENCOUNTER — Inpatient Hospital Stay (HOSPITAL_COMMUNITY)
Admission: AD | Admit: 2016-07-15 | Discharge: 2016-07-15 | Disposition: A | Discharge status: Home / Self Care | Payer: Self-pay | Source: Ambulatory Visit | Attending: Obstetrics & Gynecology | Admitting: Obstetrics & Gynecology

## 2016-07-15 ENCOUNTER — Encounter (HOSPITAL_COMMUNITY): Payer: Self-pay | Admitting: Advanced Practice Midwife

## 2016-07-15 ENCOUNTER — Inpatient Hospital Stay (HOSPITAL_COMMUNITY): Payer: Self-pay

## 2016-07-15 DIAGNOSIS — R109 Unspecified abdominal pain: Secondary | ICD-10-CM

## 2016-07-15 DIAGNOSIS — O039 Complete or unspecified spontaneous abortion without complication: Secondary | ICD-10-CM | POA: Insufficient documentation

## 2016-07-15 DIAGNOSIS — G44209 Tension-type headache, unspecified, not intractable: Secondary | ICD-10-CM | POA: Insufficient documentation

## 2016-07-15 LAB — URINALYSIS, ROUTINE W REFLEX MICROSCOPIC
Bilirubin Urine: NEGATIVE
Glucose, UA: NEGATIVE mg/dL
Hgb urine dipstick: NEGATIVE
Ketones, ur: NEGATIVE mg/dL
LEUKOCYTES UA: NEGATIVE
NITRITE: NEGATIVE
PH: 6 (ref 5.0–8.0)
PROTEIN: NEGATIVE mg/dL
Specific Gravity, Urine: 1.001 — ABNORMAL LOW (ref 1.005–1.030)

## 2016-07-15 LAB — CBC
HCT: 34.3 % — ABNORMAL LOW (ref 36.0–46.0)
HEMOGLOBIN: 11.4 g/dL — AB (ref 12.0–15.0)
MCH: 28 pg (ref 26.0–34.0)
MCHC: 33.2 g/dL (ref 30.0–36.0)
MCV: 84.3 fL (ref 78.0–100.0)
Platelets: 248 10*3/uL (ref 150–400)
RBC: 4.07 MIL/uL (ref 3.87–5.11)
RDW: 14 % (ref 11.5–15.5)
WBC: 11.9 10*3/uL — AB (ref 4.0–10.5)

## 2016-07-15 LAB — HCG, QUANTITATIVE, PREGNANCY: HCG, BETA CHAIN, QUANT, S: 12 m[IU]/mL — AB (ref <5)

## 2016-07-15 LAB — WET PREP, GENITAL
Clue Cells Wet Prep HPF POC: NONE SEEN
SPERM: NONE SEEN
TRICH WET PREP: NONE SEEN
Yeast Wet Prep HPF POC: NONE SEEN

## 2016-07-15 MED ORDER — IBUPROFEN 600 MG PO TABS: 600.0000 mg | ORAL_TABLET | Freq: Four times a day (QID) | ORAL | 1 refills | Status: DC | PRN

## 2016-07-15 MED ORDER — IBUPROFEN 600 MG PO TABS
600.0000 mg | ORAL_TABLET | Freq: Once | ORAL | Status: AC
  Administered 2016-07-15: 600 mg via ORAL
  Filled 2016-07-15: qty 1

## 2016-07-15 NOTE — MAU Note (Signed)
Pt presents complaining of lower abdominal pain and headaches for the last 2 weeks since her miscarriage. States she did her follow up like she was instructed but is still feeling bad. Took tylenol and it helps a little. Denies vaginal bleeding or discharge.

## 2016-07-15 NOTE — Discharge Instructions (Signed)
Cefalea tensional  (Tension Headache)  Una cefalea tensional es una sensación de dolor o presión que suele manifestarse en la frente y los lados de la cabeza. Este es el tipo más común de dolor de cabeza. El dolor puede ser sordo o puede sentirse que comprime (constrictivo). Generalmente, no se asocia con náuseas o vómitos y no empeora con la actividad física. Las cefaleas tensionales pueden durar de 30 minutos a varios días.  CAUSAS  Se desconoce la causa exacta de esta afección. Suelen comenzar después de una situación de estrés, ansiedad o por depresión. Otros factores desencadenantes pueden ser los siguientes:  · Alcohol.  · Demasiada cafeína o abstinencia de cafeína.  · Infecciones respiratorias, como resfriados, gripes o sinusitis.  · Problemas dentales o apretar los dientes.  · Fatiga.  · Mantener la cabeza y el cuello en la misma posición durante un período prolongado, por ejemplo, al usar la computadora.  · Fumar.  SÍNTOMAS  Los síntomas de esta afección incluyen lo siguiente:  · Sensación de presión alrededor de la cabeza.  · Dolor "sordo" en la cabeza.  · Dolor que siente sobre la frente y los lados de la cabeza.  · Dolor a la palpación en los músculos de la cabeza, del cuello y de los hombros.  DIAGNÓSTICO  Esta afección se puede diagnosticar en función de los síntomas y de un examen físico. Pueden hacerle estudios, como una tomografía computarizada o una resonancia magnética de la cabeza. Estos estudios se indican si los síntomas son graves o fuera de lo común.  TRATAMIENTO  Esta afección puede tratarse con cambios en el estilo de vida y medicamentos que lo ayuden a aliviar los síntomas.  INSTRUCCIONES PARA EL CUIDADO EN EL HOGAR  Control del dolor  · Tome los medicamentos de venta libre y los recetados solamente como se lo haya indicado el médico.  · Cuando sienta dolor de cabeza acuéstese en un cuarto oscuro y tranquilo.  · Si se lo indican, aplique hielo sobre la cabeza y la zona del cuello:   ? Ponga el hielo en una bolsa plástica.  ? Coloque una toalla entre la piel y la bolsa de hielo.  ? Coloque el hielo durante 20 minutos, 2 a 3 veces por día.  · Utilice una almohadilla térmica o tome una ducha con agua caliente para aplicar calor en la cabeza y la zona del cuello como se lo haya indicado el médico.  Comida y bebida  · Mantenga un horario para las comidas.  · Limite el consumo de bebidas alcohólicas.  · Disminuya el consumo de cafeína o deje de consumir cafeína.  Instrucciones generales  · Concurra a todas las visitas de control como se lo haya indicado el médico. Esto es importante.  · Lleve un diario de los dolores de cabeza para averiguar qué factores pueden desencadenarlos. Por ejemplo, escriba los siguientes datos:  ? Lo que usted come y bebe.  ? Cuánto tiempo duerme.  ? Algún cambio en su dieta o en los medicamentos.  · Pruebe algunas técnicas de relajación, como los masajes.  · Limite el estrés.  · Siéntese derecho y evite tensionar los músculos.  · No consuma productos que contengan tabaco, incluidos cigarrillos, tabaco de mascar o cigarrillos electrónicos. Si necesita ayuda para dejar de fumar, consulte al médico.  · Haga actividad física habitualmente como se lo haya indicado el médico.  · Duerma entre 7 y 9 horas o la cantidad de horas que le haya recomendado el médico.  SOLICITE ATENCIÓN   MÉDICA SI:  · Los medicamentos no logran aliviar los síntomas.  · Tiene un dolor de cabeza que es diferente del dolor de cabeza habitual.  · Tiene náuseas o vómitos.  · Tiene fiebre.  SOLICITE ATENCIÓN MÉDICA DE INMEDIATO SI:  · El dolor se hace cada vez más intenso.  · Ha vomitado repetidas veces.  · Presenta rigidez en el cuello.  · Sufre pérdida de la visión.  · Tiene problemas para hablar.  · Siente dolor en el ojo o en el oído.  · Presenta debilidad muscular o pérdida del control muscular.  · Pierde el equilibrio o tiene problemas para caminar.  · Sufre mareos o se desmaya.  · Se siente confundido.   Esta información no tiene como fin reemplazar el consejo del médico. Asegúrese de hacerle al médico cualquier pregunta que tenga.  Document Released: 02/05/2005 Document Revised: 01/17/2015  Elsevier Interactive Patient Education © 2017 [employer] Inc[employer]

## 2016-07-16 LAB — GC/CHLAMYDIA PROBE AMP (~~LOC~~) NOT AT ARMC
CHLAMYDIA, DNA PROBE: NEGATIVE
Neisseria Gonorrhea: NEGATIVE

## 2016-07-16 NOTE — MAU Provider Note (Signed)
Chief Complaint: No chief complaint on file.   First Provider Initiated Contact with Patient 07/15/16 2018     SUBJECTIVE HPI: Kari Ortega is  36 y.o. [id number] female Dx'd w/ SAB 2 weeks ago who is here for continued low-mid abd pain since SAB and HA x 5 days. Reports significant emotional distress and grief since the diagnosis of SAB. She is particularly upset about having to explain to her two young children that she experienced a miscarriage and she and her children cannot understand why God did this to her.  Results for Kari, BRAATEN Ortega (MRN [id number]) as of 07/16/2016 01:05  Ref. Range 07/01/2016 09:48 07/02/2016 02:14 07/04/2016 09:50  HCG, Beta Chain, Quant, S Latest Ref Range: <5 mIU/mL 15,479 (H) 13,878 (H) 2,686 (H)    Abd pain Location: Low-mid abd Quality: Pressure Severity: Moderate Duration: 2+weeks Context: Post-SAB Timing: intermittent Modifying factors: No improvement w/ Tylenol. No relationship to movement, eating, voiding or defecation. Associated signs and symptoms: Negative for fever, chills, vaginal bleeding, vaginal discharge, nausea, vomiting, diarrhea, constipation or urinary complaints.  Headache Location: Bilat  Quality: Pressure Severity: 8/10 on pain scale Duration: 5 days Context: Post-SAB. Timing: Constant Modifying factors: No improvement with Tylenol 500 mg every 8 hours. Associated signs and symptoms: Negative for fever, chills, vision changes, difficulties with speech or gait, sinus congestion.   Past Medical History:  Diagnosis Date  . Medical history non-contributory    OB History  Gravida Para Term Preterm AB Living  4 2 2   1 2   SAB TAB Ectopic Multiple Live Births  1       2    # Outcome Date GA Lbr Len/2nd Weight Sex Delivery Anes PTL Lv  4 Gravida           3 Term 2009    M Vag-Spont   LIV  2 SAB 2008          1 Term      Vag-Spont        Past Surgical History:  Procedure Laterality Date  . NO PAST  SURGERIES     Social History   Social History  . Marital status: Married    Spouse name: N/A  . Number of children: N/A  . Years of education: N/A   Occupational History  . Not on file.   Social History Main Topics  . Smoking status: Never Smoker  . Smokeless tobacco: Never Used  . Alcohol use No  . Drug use: No  . Sexual activity: Not on file   Other Topics Concern  . Not on file   Social History Narrative  . No narrative on file   History reviewed. No pertinent family history. No current facility-administered medications on file prior to encounter.    Current Outpatient Prescriptions on File Prior to Encounter  Medication Sig Dispense Refill  . oxyCODONE-acetaminophen (PERCOCET/ROXICET) 5-325 MG tablet Take 1-2 tablets by mouth every 6 (six) hours as needed. 15 tablet 0   No Known Allergies  I have reviewed patient's Past Medical Hx, Surgical Hx, Family Hx, Social Hx, medications and allergies.   Review of Systems  Constitutional: Positive for fatigue. Negative for appetite change, chills and fever.  HENT: Negative for congestion, sinus pain and sinus pressure.   Eyes: Negative for visual disturbance.  Gastrointestinal: Positive for abdominal distention and abdominal pain. Negative for blood in stool, constipation, diarrhea, nausea and vomiting.  Genitourinary: Negative for dysuria, flank pain, frequency, hematuria, pelvic pain, urgency, vaginal  bleeding, vaginal discharge and vaginal pain.  Musculoskeletal: Negative for back pain, gait problem, neck pain and neck stiffness.  Neurological: Positive for headaches. Negative for dizziness, syncope, speech difficulty and weakness.  Psychiatric/Behavioral: Positive for dysphoric mood. Negative for confusion.    OBJECTIVE Patient Vitals for the past 24 hrs:  BP Temp Pulse Resp Height Weight  07/15/16 2212 106/70 - 70 18 - -  07/15/16 1946 122/68 98 F (36.7 C) 90 18 4\' 8"  (1.422 m) 152 lb (68.9 kg)   Constitutional:  Well-developed, well-nourished female in no acute distress.  Cardiovascular: normal rate Respiratory: normal rate and effort.  Head: Atraumatic, sinuses nontender, no congestion, grossly normal vision. GI: Abd soft, Mild tenderness across entire low abdomen from suprapubic area up to umbilicus. Negative for guarding, rebound tenderness or palpable mass. Pos BS x 4 Neurologic: Alert and oriented x 4. Normal speech and gait. GU: Neg CVAT.  SPECULUM EXAM: NEFG, physiologic discharge, no blood noted, cervix with normal ectropion.  BIMANUAL: cervix closed; uterus normal size, mild left adnexal tenderness. No mass. No right adnexal tenderness or mass. No CMT.  LAB RESULTS Results for orders placed or performed during the hospital encounter of 07/15/16 (from the past 24 hour(s))  Urinalysis, Routine w reflex microscopic     Status: Abnormal   Collection Time: 07/15/16  7:43 PM  Result Value Ref Range   Color, Urine COLORLESS (A) YELLOW   APPearance CLEAR CLEAR   Specific Gravity, Urine 1.001 (L) 1.005 - 1.030   pH 6.0 5.0 - 8.0   Glucose, UA NEGATIVE NEGATIVE mg/dL   Hgb urine dipstick NEGATIVE NEGATIVE   Bilirubin Urine NEGATIVE NEGATIVE   Ketones, ur NEGATIVE NEGATIVE mg/dL   Protein, ur NEGATIVE NEGATIVE mg/dL   Nitrite NEGATIVE NEGATIVE   Leukocytes, UA NEGATIVE NEGATIVE  CBC     Status: Abnormal   Collection Time: 07/15/16  7:56 PM  Result Value Ref Range   WBC 11.9 (H) 4.0 - 10.5 K/uL   RBC 4.07 3.87 - 5.11 MIL/uL   Hemoglobin 11.4 (L) 12.0 - 15.0 g/dL   HCT 34.3 (L) 36.0 - 46.0 %   MCV 84.3 78.0 - 100.0 fL   MCH 28.0 26.0 - 34.0 pg   MCHC 33.2 30.0 - 36.0 g/dL   RDW 14.0 11.5 - 15.5 %   Platelets 248 150 - 400 K/uL  hCG, quantitative, pregnancy     Status: Abnormal   Collection Time: 07/15/16  7:56 PM  Result Value Ref Range   hCG, Beta Chain, Quant, S 12 (H) <5 mIU/mL  Wet prep, genital     Status: Abnormal   Collection Time: 07/15/16  8:25 PM  Result Value Ref Range    Yeast Wet Prep HPF POC NONE SEEN NONE SEEN   Trich, Wet Prep NONE SEEN NONE SEEN   Clue Cells Wet Prep HPF POC NONE SEEN NONE SEEN   WBC, Wet Prep HPF POC MANY (A) NONE SEEN   Sperm NONE SEEN     IMAGING US Ob Comp Less 14 Wks  Result Date: 07/01/2016 CLINICAL DATA:  Vaginal bleeding.  First trimester pregnancy. EXAM: OBSTETRIC <14 WK [place] AND TRANSVAGINAL OB US TECHNIQUE: Both transabdominal and transvaginal ultrasound examinations were performed for complete evaluation of the gestation as well as the maternal uterus, adnexal regions, and pelvic cul-de-sac. Transvaginal technique was performed to assess early pregnancy. COMPARISON:  12/16/7 FINDINGS: Intrauterine gestational sac: Single Yolk sac:  Not Visualized. Embryo:  Not Visualized. Cardiac Activity: Not Visualized. MSD:  17.9  mm   6 w   4  d Subchorionic hemorrhage:  Small measuring 2.2 x 0.8 x 2.2 cm Maternal uterus/adnexae: Right ovary: Normal Left ovary: Normal Other :None Free fluid:  None IMPRESSION: 1. Probable early intrauterine gestational sac, but no yolk sac, fetal pole, or cardiac activity yet visualized. Recommend follow-up quantitative B-HCG levels and follow-up US in 14 days to assess viability. This recommendation follows SRU consensus guidelines: Diagnostic Criteria for Nonviable Pregnancy Early in the First Trimester. [place] Med 2013; 409:8119-14. 2. Small subchorionic hemorrhage. Electronically Signed   By: Kerby Moors M.D.   On: 07/01/2016 11:16   US Ob Transvaginal  Result Date: 07/15/2016 CLINICAL DATA:  Initial evaluation for acute pelvic pain, recent miscarriage. Beta HCG = 12 EXAM: OBSTETRIC <14 WK ULTRASOUND TECHNIQUE: Transvaginal ultrasound was performed for evaluation of the gestation as well as the maternal uterus and adnexal regions. COMPARISON:  Comparison made with previous ultrasound from 07/01/2016. FINDINGS: Intrauterine gestational sac:  None visualized. Yolk sac:  N/A Embryo:  N/A Cardiac Activity: N/A  Heart Rate: N/A bpm MSD:   mm    w     d CRL:     mm    w  d                  [place] EDC: Subchorionic hemorrhage:  None visualized. Maternal uterus/adnexae: Normal sonographic appearance of the ovaries. No free fluid identified. No acute abnormality identified within the pelvis or either adnexa IMPRESSION: 1. No intrauterine gestational sac identified. No other complication identified from recent miscarriage/failed pregnancy. 2. Normal sonographic appearance of the ovaries. No other acute abnormality within the pelvis. Electronically Signed   By: Jeannine Boga M.D.   On: 07/15/2016 21:53   Discussed history, exam, labs with Dr. Harolyn Rutherford. Will order ultrasound to evaluate for possible avascular retained products of conception or adnexal pathology.  MAU COURSE Orders Placed This Encounter  Procedures  . Wet prep, genital  . US OB Transvaginal  . Urinalysis, Routine w reflex microscopic  . CBC  . hCG, quantitative, pregnancy  . Discharge patient   Meds ordered this encounter  Medications  . ibuprofen (ADVIL,MOTRIN) tablet 600 mg  . ibuprofen (ADVIL,MOTRIN) 600 MG tablet    Sig: Take 1 tablet (600 mg total) by mouth every 6 (six) hours as needed for headache or moderate pain.    Dispense:  30 tablet    Refill:  1    Order Specific Question:   Supervising Provider    Answer:   Verita Schneiders A [[id number]]   Headache and abdominal pain have decreased to 3/10 on pain scale.  MDM - Headache consistent with tension headache. Patient reports significant distress and crying since SAB. Significant improvement with ibuprofen. No evidence of emergent headache. Also recommend warm compresses or warm baths and neck stretches. Follow with primary care when necessary. - Generalized low abdominal pain without evidence of retained products of conception or septic abortion. Patient was very reassured to find that there is nothing left in her uterus and that her hCG level is almost back to normal. Suspect  somatization from the grief of miscarriage.  ASSESSMENT 1. Abdominal pain in female   2. SAB (spontaneous abortion)   3. Spontaneous abortion without complication   4. Acute non intractable tension-type headache     PLAN Discharge home in stable condition. Headache red flags reviewed. Reassured patient at miscarriage is complete and without complication. Follow-up Information    primary  care provider Follow up.   Why:  as needed for headaches       Ob/Gyn Follow up.   Why:  for routine care       [facility] Follow up.   Specialty:  Emergency Medicine Why:  for emergencies Contact information: [address] [id number] [place] [phone]         Allergies as of 07/15/2016   No Known Allergies     Medication List    TAKE these medications   ibuprofen 600 MG tablet Commonly known as:  ADVIL,MOTRIN Take 1 tablet (600 mg total) by mouth every 6 (six) hours as needed for headache or moderate pain.   multivitamin-prenatal 27-0.8 MG Tabs tablet Take 1 tablet by mouth daily at 12 noon.   oxyCODONE-acetaminophen 5-325 MG tablet Commonly known as:  PERCOCET/ROXICET Take 1-2 tablets by mouth every 6 (six) hours as needed.        [place], CNM 07/16/2016  12:42 AM

## 2016-09-02 ENCOUNTER — Ambulatory Visit (INDEPENDENT_AMBULATORY_CARE_PROVIDER_SITE_OTHER): Payer: Self-pay | Admitting: Internal Medicine

## 2016-09-02 ENCOUNTER — Encounter: Payer: Self-pay | Admitting: Internal Medicine

## 2016-09-02 VITALS — BP 122/80 | HR 78 | Resp 12 | Ht <= 58 in | Wt 155.0 lb

## 2016-09-02 DIAGNOSIS — Z3200 Encounter for pregnancy test, result unknown: Secondary | ICD-10-CM

## 2016-09-02 DIAGNOSIS — N926 Irregular menstruation, unspecified: Secondary | ICD-10-CM

## 2016-09-02 DIAGNOSIS — D649 Anemia, unspecified: Secondary | ICD-10-CM | POA: Insufficient documentation

## 2016-09-02 DIAGNOSIS — J301 Allergic rhinitis due to pollen: Secondary | ICD-10-CM

## 2016-09-02 MED ORDER — MOMETASONE FUROATE 50 MCG/ACT NA SUSP: NASAL | 12 refills | Status: DC

## 2016-09-02 MED ORDER — OLOPATADINE HCL 0.2 % OP SOLN
OPHTHALMIC | 11 refills | Status: DC
Start: 2016-09-02 — End: 2017-08-24

## 2016-09-02 MED ORDER — FEXOFENADINE HCL 180 MG PO TABS: 180.0000 mg | ORAL_TABLET | Freq: Every day | ORAL | Status: DC

## 2016-09-02 NOTE — Progress Notes (Signed)
   Subjective:    Patient ID: Kari Ortega, female    DOB: May 14, 1980, 36 y.o.   MRN: [id number]    HPI   Here to establish [facility] interpreting.  1.  Has bumps on back of tongue and throat hurts.  Has itchy nose and left eye is irritated.  Has had symptoms for 5 days.  Has never had these symptoms before. Sneezing, eyes itch and red.   Tried Zyrtec, but gives her a "sour stomach"  Though helped a bit with symptoms.  Only took 3 days.   Has not tried any other medications. Moved to [facility] 04/2016. Lived in [place] from 2005-2012.  2.  Thinks she might be pregnant.  Husband sometimes uses condoms and sometimes coitus interruptus, but have not done this at times recently with relations.   Urine HCG today is negative.   Patient is a difficult historian, but sounds like she would like to have one more child in the future, just not now. Gives a history she miscarried early in a pregnancy in January 2018. States she wants a medicine to prepare her for a pregnancy.  Reports in the past, she went to Dr. Felicita Gage, OB/GYN who gave her a medicine to prepare her for pregnancy.   Describes getting hormonal treatment to get her periods on track.  Denies taking Metformin or being told she had PCOS.    Current Meds  Medication Sig  . ibuprofen (ADVIL,MOTRIN) 600 MG tablet Take 1 tablet (600 mg total) by mouth every 6 (six) hours as needed for headache or moderate pain.  . Multiple Vitamin (MULTIVITAMIN) tablet Take 1 tablet by mouth daily.    No Known Allergies   Past Medical History:  Diagnosis Date  . Anemia   . Irregular periods     History reviewed. No pertinent surgical history.   Family History  Problem Relation Age of Onset  . Diabetes Mother     Social History   Social History  . Marital status: Married    Spouse name: N/A  . Number of children: N/A  . Years of education: N/A   Occupational History  . Not on file.   Social History Main Topics    . Smoking status: Never Smoker  . Smokeless tobacco: Never Used  . Alcohol use No  . Drug use: No  . Sexual activity: Yes    Birth control/ protection: Coitus interruptus, Condom   Other Topics Concern  . Not on file   Social History Narrative  . No narrative on file       Review of Systems     Objective:   Physical Exam NAD HEENT: PERRL, EOMI, conjunctivae injected mildly bilaterally.  Posterior pharynx with cobbling.  TMs pearly gray.  Nasal mucosa boggy with clear discharge. Neck:  Supple, No adenopathy Chest:  CTA CV:  RRR with normal S1 and S2, No S3, S4 or murmur.  Radial pulses normal and equal Abd:  S, NT, No HSM or mass, + BS        Assessment & Plan:  1.  Seasonal Allergies:  Fexofenadine 180 mg daily.  To obtain Nasonex and Pataday through [facility].    2.  Irregular periods and concern for pregnancy:  Urine HCG negative today.  Encouraged to go to [facility] if looking for fertility treatment--not clear what she is wanting.  Send for Dr. Ulice Brilliant records to get an idea what her treatment was.

## 2016-09-23 ENCOUNTER — Encounter (INDEPENDENT_AMBULATORY_CARE_PROVIDER_SITE_OTHER): Payer: Self-pay | Admitting: Physician Assistant

## 2016-09-23 ENCOUNTER — Ambulatory Visit (INDEPENDENT_AMBULATORY_CARE_PROVIDER_SITE_OTHER): Payer: Self-pay | Admitting: Physician Assistant

## 2016-09-23 VITALS — BP 108/74 | HR 76 | Temp 98.2°F | Ht <= 58 in | Wt 153.8 lb

## 2016-09-23 DIAGNOSIS — G44209 Tension-type headache, unspecified, not intractable: Secondary | ICD-10-CM

## 2016-09-23 DIAGNOSIS — J301 Allergic rhinitis due to pollen: Secondary | ICD-10-CM

## 2016-09-23 DIAGNOSIS — N979 Female infertility, unspecified: Secondary | ICD-10-CM

## 2016-09-23 MED ORDER — CYCLOBENZAPRINE HCL 10 MG PO TABS: 10.0000 mg | ORAL_TABLET | Freq: Three times a day (TID) | ORAL | 0 refills | Status: DC | PRN

## 2016-09-23 MED ORDER — NAPROXEN 500 MG PO TABS: 500.0000 mg | ORAL_TABLET | Freq: Two times a day (BID) | ORAL | 0 refills | Status: DC

## 2016-09-23 MED ORDER — CETIRIZINE HCL 10 MG PO TABS: 10.0000 mg | ORAL_TABLET | Freq: Every day | ORAL | 2 refills | Status: DC

## 2016-09-23 NOTE — Patient Instructions (Addendum)
Please bring your medical records from your previous infertility doctor.   Infertilidad (Infertility) La infertilidad es no poder Retail buyer (concebir) despus de un ao de Theatre manager relaciones sexuales regularmente sin usar ningn mtodo de control de la natalidad. La infertilidad tambin puede significar que una mujer no puede llevar el embarazo a trmino. Tanto hombres como mujeres pueden tener problemas de fertilidad. CULES SON LAS CAUSAS DE LA INFERTILIDAD? Cules son las causas de la infertilidad en las mujeres? Hay muchas causas posibles de infertilidad en las mujeres. En algunas mujeres, no hay ninguna causa aparente de infertilidad (infertilidad idioptica). La infertilidad tambin puede estar vinculada a ms de [place]. Los problemas de infertilidad en las mujeres pueden deberse a problemas en el ciclo menstrual o los rganos reproductivos, ciertas afecciones mdicas y factores relacionados con la edad y el estilo de vida.  Los problemas en el ciclo menstrual pueden interferir con los ovarios que producen los vulos (ovulacin). Esto puede causar dificultad para quedar embarazada e incluye tener un ciclo menstrual muy largo, muy corto o irregular.  Los problemas relacionados con los rganos reproductivos incluyen lo siguiente:  Un cuello de tero anormalmente angosto o que no permanece cerrado durante el [place].  Una obstruccin en las trompas de [place].  Un tero de forma anormal.  Fibromas uterinos. Estos son masas de tejido (tumores) que pueden Therapist, art.  Las afecciones mdicas que pueden afectar la fertilidad en las mujeres incluyen lo siguiente:  Sndrome de ovario poliqustico (SOP). Este es un trastorno hormonal que produce pequeos quistes en los ovarios y es la causa ms frecuente de infertilidad en las mujeres.  Endometriosis. Es una enfermedad en la que el tejido que recubre el tero (endometrio) crece fuera de su ubicacin  normal.  Insuficiencia ovrica primaria. Esto es cuando los ovarios dejan de producir vulos y hormonas antes de los 19aos de [place].  Enfermedades de transmisin sexual (ETS), como la clamidia o la gonorrea. Estas infecciones pueden provocar fibrosis en las trompas de [place], lo que disminuye la probabilidad de que los vulos lleguen al tero.  Trastornos autoinmunitarios. Estas son afecciones en las que el sistema inmunitario ataca las clulas normales y saludables.  Desequilibrios hormonales.  Otros factores incluyen los siguientes:  La edad. La fertilidad en las mujeres declina con la edad, especialmente despus de los 35aos.  Tener bajo peso o sobrepeso.  Beber alcohol en exceso.  Consumir drogas.  Hacer ejercicio en exceso.  La exposicin a toxinas ambientales, como la radiacin, los plaguicidas y ciertos qumicos. Cules son las causas de la infertilidad en los hombres? Hay muchas causas de infertilidad en los hombres. La infertilidad puede estar vinculada a ms de [employer]. Los problemas de infertilidad en los hombres pueden deberse a problemas con los espermatozoides o los rganos reproductivos, ciertas afecciones mdicas y factores relacionados con la edad y el estilo de vida. Algunos hombres tienen infertilidad idioptica.  Problemas con los espermatozoides. La infertilidad puede ser consecuencia de un problema para producir lo siguiente:  Suficientes espermatozoides (bajo recuento de espermatozoides).  Suficientes espermatozoides con forma normal (morfologa de los espermatozoides).  Espermatozoides que puedan llegar al vulo (motilidad deficiente).  Las causas de la infertilidad tambin incluyen las siguientes:  Un problema con las hormonas.  Venas agrandadas (varicocele), quistes (espermatocele) o tumores en los testculos.  Disfuncin sexual.  Ardelia Mems lesin en los testculos.  Un defecto de nacimiento, como no tener los conductos que transportan los  espermatozoides (conductos deferentes).  Algunas de  las afecciones mdicas que pueden afectar la fertilidad en los hombres son las siguientes:  Diabetes.  Tratamiento Social worker, como la radioterapia o la quimioterapia.  El sndrome de Klinefelter. Este es un trastorno gentico hereditario.  Problemas de tiroides, como una tiroides hipoactiva o hiperactiva.  Fibrosis qustica.  Enfermedades de transmisin sexual.  Otros factores incluyen los siguientes:  La edad. La fertilidad en los hombres declina con la edad.  Beber alcohol en exceso.  Consumir drogas.  La exposicin a toxinas ambientales, como la radiacin, los plaguicidas y el plomo. CULES SON LOS SNTOMAS DE LA INFERTILIDAD? El nico signo de infertilidad es no poder Retail buyer despus de un ao de Theatre manager relaciones sexuales regularmente sin usar ningn mtodo de control de la natalidad. CMO SE DIAGNOSTICA LA INFERTILIDAD? Para recibir un diagnstico de infertilidad, ambos integrantes de la pareja se harn a un examen fsico. Tambin se analizar en detalle la historia clnica y sexual de ambos. Si no hay una razn evidente de infertilidad, se harn estudios adicionales. Qu estudios se harn las mujeres? Las mujeres primero pueden hacerse estudios para controlar si ovulan todos los meses. Estos estudios pueden incluir lo siguiente:  Anlisis de sangre para [employer] niveles hormonales.  Una ecografa de los ovarios. Este estudio detecta posibles problemas en los ovarios.  Toma de [place] de tejido que recubre el tero para examinarla en el microscopio (biopsia de endometrio). Las mujeres que ovulan pueden realizarse estudios adicionales. Estos pueden incluir los siguientes:  Histerosalpingografa.  Es una radiografa de las trompas de [place] y el tero despus de Tour manager un tipo especfico de sustancia de [place].  Esta prueba puede mostrar la forma del tero y si las trompas  de [place] estn abiertas.  Laparoscopia.  En [employer], se [place] un tubo que emite luz (laparoscopio) para Education officer, environmental en las trompas de [place] y otros rganos femeninos.  Ecografa transvaginal.  Este es un estudio de diagnstico por imgenes que determina si hay anomalas en el tero y los ovarios.  El mdico puede utilizar este estudio para contar la cantidad de [employer] ovarios.  Histeroscopa.  En [employer], se [place] un tubo que emite luz para examinar el cuello y el interior del tero.  Se realiza para Airline pilot en el interior del tero. Qu estudios se harn los hombres? Los estudios para Office manager infertilidad en los hombres incluyen los siguientes:  Anlisis de semen para controlar el recuento, la morfologa y la motilidad de los espermatozoides.  Anlisis de sangre para [employer].  Toma de [place] de tejido del interior de un testculo (biopsia). La muestra se examina en el microscopio.  Anlisis de sangre para Airline pilot genticas (pruebas genticas). CUL ES EL TRATAMIENTO PARA LA INFERTILIDAD EN LAS MUJERES? El tratamiento depende de la causa de la infertilidad. En la [employer], la infertilidad en las mujeres se trata con medicamentos o [place].  Las mujeres pueden tomar medicamentos para:  Biomedical scientist de ovulacin.  Tratar otras enfermedades, como el [place].  Se puede realizar Clementeen Hoof para lo siguiente:  Reparar daos en los ovarios, las trompas de [place], el cuello del tero o Nurse, learning disability.  Extraer tumores del tero.  Extraer tejido cicatricial del tero, la pelvis u otros rganos femeninos. CUL ES EL TRATAMIENTO PARA LA INFERTILIDAD EN LOS [place]? El tratamiento depende de la causa de la infertilidad. En la [employer], la infertilidad [employer]  hombres se trata con medicamentos o [place].  Los hombres pueden tomar medicamentos para:  Diplomatic Services operational officer.  Tratar otras enfermedades.  Tratar la disfuncin sexual.  Se puede realizar Clementeen Hoof para lo siguiente:  Eliminar obstrucciones en el tracto reproductivo.  Corregir otros problemas estructurales del tracto reproductivo. QU SON LAS TCNICAS DE REPRODUCCIN ASISTIDA? Las tcnicas de reproduccin asistida (TRA) se refieren a todos los tratamientos y procedimientos que unen vulos y espermatozoides fuera del cuerpo para ayudar a una pareja a Counselling psychologist. Las TRA se suelen Oceanographer con medicamentos para la fertilidad que estimulan la ovulacin. En algunos casos, las TRA se llevan a cabo con vulos extrados del cuerpo de otra mujer (vulos de donante) o con vulos previamente fertilizados y congelados (embriones). Hay diferentes tipos de TRA. Estos incluyen los siguientes:  Inseminacin intrauterina (IIU).  En este procedimiento, los espermatozoides se colocan directamente en el tero de la mujer con un tubo largo y delgado.  Esta tcnica puede ser la ms efectiva para la infertilidad causada por problemas con los espermatozoides, incluidos el bajo recuento y la baja motilidad.  Se puede utilizar en combinacin con medicamentos para la fertilidad.  Fertilizacin in vitro (FIV).  Por lo general, se [place] esta tcnica cuando las trompas de [place] de la mujer estn obstruidas o si el hombre tiene un bajo recuento de espermatozoides.  Los medicamentos para la infertilidad estimulan los ovarios para que produzcan varios vulos. Cuando estn maduros, estos vulos se extraen del cuerpo y se unen a los espermatozoides para ser fertilizados.  Luego los vulos fertilizados se colocan en el tero de la [place]. Esta informacin no tiene Marine scientist el consejo del mdico. Asegrese de hacerle al mdico cualquier pregunta que tenga. Document Released: 05/18/2007 Document Revised: 05/19/2014 Document Reviewed: 01/11/2014 Elsevier Interactive Patient Education  2017  [employer].

## 2016-09-23 NOTE — Progress Notes (Signed)
Subjective:  Patient ID: Kari Ortega, female    DOB: Sep 16, 1980  Age: 36 y.o. MRN: [id number]  CC: headache  HPI Kari Ortega is a 36 y.o. female with a PMH of infertility presents with complaint of headache. Onset x1 week but is currently resolved. Usually has a frontal headache with a frequency of approximately three per year. There are no other associated symptoms. Headaches attributed to the birth of her son 7 years ago.     Also wants to discuss infertility. She was under the care of an infertility doctor and given treatment which resulted in her pregnancy. She would like to be prescribed the same medication. Does not know her diagnosis or name of medication. Does not bring medical records for review. Has a history of irregular menses and ectopic pregnancy. Still has irregularity and unable to conceive another child. Denies any other symptoms.      Outpatient Medications Prior to Visit  Medication Sig Dispense Refill  . fexofenadine (ALLEGRA) 180 MG tablet Take 1 tablet (180 mg total) by mouth daily.    Marland Kitchen ibuprofen (ADVIL,MOTRIN) 600 MG tablet Take 1 tablet (600 mg total) by mouth every 6 (six) hours as needed for headache or moderate pain. 30 tablet 1  . mometasone (NASONEX) 50 MCG/ACT nasal spray 2 sprays each nostril once daily 17 g 12  . Multiple Vitamin (MULTIVITAMIN) tablet Take 1 tablet by mouth daily.    . Olopatadine HCl (PATADAY) 0.2 % SOLN 1 drop both eyes once daily 2.5 mL 11   No facility-administered medications prior to visit.      ROS Review of Systems  Constitutional: Negative for chills, fever and malaise/fatigue.  Eyes: Negative for blurred vision.  Respiratory: Negative for shortness of breath.   Cardiovascular: Negative for chest pain and palpitations.  Gastrointestinal: Negative for abdominal pain and nausea.  Genitourinary: Negative for dysuria and hematuria.  Musculoskeletal: Negative for joint pain and myalgias.  Skin:  Negative for rash.  Neurological: Negative for tingling and headaches.       Left sided facial droop, chronic  Endo/Heme/Allergies:       Infertility  Psychiatric/Behavioral: Negative for depression. The patient is not nervous/anxious.     Objective:  BP 108/74 (BP Location: Left Arm, Patient Position: Sitting, Cuff Size: Normal)   Pulse 76   Temp 98.2 F (36.8 C) (Oral)   Ht 4\' 9"  (1.448 m)   Wt 153 lb 12.8 oz (69.8 kg)   LMP 09/06/2016 (Exact Date)   SpO2 96%   Breastfeeding? No   BMI 33.28 kg/m   BP/Weight 09/23/2016 2/35/3614 08/13/1538  Systolic BP 086 761 950  Diastolic BP 74 80 70  Wt. (Lbs) 153.8 155 152  BMI 33.28 33.25 34.08      Physical Exam  Constitutional: She is oriented to person, place, and time.  Well developed, obese, NAD, polite  HENT:  Head: Normocephalic and atraumatic.  No sinus tenderness to palpation. Turbinates mildly hypertrophic. Mild postnasal drip.  Eyes: No scleral icterus.  Cardiovascular: Normal rate, regular rhythm and normal heart sounds.   Pulmonary/Chest: Effort normal and breath sounds normal.  Abdominal: Soft. Bowel sounds are normal.  Musculoskeletal: She exhibits no edema.  Lymphadenopathy:    She has no cervical adenopathy.  Neurological: She is alert and oriented to person, place, and time.  Left sided facial drooping.  Skin: Skin is warm and dry. No rash noted. No erythema. No pallor.  Psychiatric: She has a normal mood and affect. Her behavior  is normal. Thought content normal.  Vitals reviewed.    Assessment & Plan:   1. Tension-type headache, not intractable, unspecified chronicity pattern - Begin cyclobenzaprine (FLEXERIL) 10 MG tablet; Take 1 tablet (10 mg total) by mouth 3 (three) times daily as needed for muscle spasms.  Dispense: 30 tablet; Refill: 0. Use qhs prn. Advised up to three per day if severe muscular tension.  - Begin naproxen (NAPROSYN) 500 MG tablet; Take 1 tablet (500 mg total) by mouth 2 (two) times  daily with a meal.  Dispense: 30 tablet; Refill: 0 - CBC with Differential - Comprehensive metabolic panel  2. Infertility, female - Asked patient to bring her medical records. Alternatively, I offered to start lab testing and imaging for her. She will try to bring records.  3. Seasonal allergic rhinitis due to pollen - Begin cetirizine (ZYRTEC) 10 MG tablet; Take 1 tablet (10 mg total) by mouth daily.  Dispense: 30 tablet; Refill: 2   Meds ordered this encounter  Medications  . cyclobenzaprine (FLEXERIL) 10 MG tablet    Sig: Take 1 tablet (10 mg total) by mouth 3 (three) times daily as needed for muscle spasms.    Dispense:  30 tablet    Refill:  0    Order Specific Question:   Supervising Provider    Answer:   Tresa Garter [id number]  . naproxen (NAPROSYN) 500 MG tablet    Sig: Take 1 tablet (500 mg total) by mouth 2 (two) times daily with a meal.    Dispense:  30 tablet    Refill:  0    Order Specific Question:   Supervising Provider    Answer:   Tresa Garter [id number]  . cetirizine (ZYRTEC) 10 MG tablet    Sig: Take 1 tablet (10 mg total) by mouth daily.    Dispense:  30 tablet    Refill:  2    Order Specific Question:   Supervising Provider    Answer:   Tresa Garter [id number]    Follow-up: Return in about 4 weeks (around 10/21/2016).   Clent Demark PA

## 2016-09-24 LAB — CBC WITH DIFFERENTIAL/PLATELET
BASOS: 0 %
Basophils Absolute: 0 10*3/uL (ref 0.0–0.2)
EOS (ABSOLUTE): 0.7 10*3/uL — ABNORMAL HIGH (ref 0.0–0.4)
EOS: 7 %
HEMATOCRIT: 36.1 % (ref 34.0–46.6)
Hemoglobin: 11.9 g/dL (ref 11.1–15.9)
Immature Grans (Abs): 0 10*3/uL (ref 0.0–0.1)
Immature Granulocytes: 0 %
LYMPHS ABS: 2.7 10*3/uL (ref 0.7–3.1)
Lymphs: 30 %
MCH: 27.6 pg (ref 26.6–33.0)
MCHC: 33 g/dL (ref 31.5–35.7)
MCV: 84 fL (ref 79–97)
MONOS ABS: 0.6 10*3/uL (ref 0.1–0.9)
Monocytes: 7 %
Neutrophils Absolute: 4.9 10*3/uL (ref 1.4–7.0)
Neutrophils: 56 %
Platelets: 305 10*3/uL (ref 150–379)
RBC: 4.31 x10E6/uL (ref 3.77–5.28)
RDW: 14.4 % (ref 12.3–15.4)
WBC: 8.9 10*3/uL (ref 3.4–10.8)

## 2016-09-24 LAB — COMPREHENSIVE METABOLIC PANEL
A/G RATIO: 1.2 (ref 1.2–2.2)
ALK PHOS: 92 IU/L (ref 39–117)
ALT: 31 IU/L (ref 0–32)
AST: 29 IU/L (ref 0–40)
Albumin: 4.1 g/dL (ref 3.5–5.5)
BUN / CREAT RATIO: 16 (ref 9–23)
BUN: 9 mg/dL (ref 6–20)
Bilirubin Total: 0.3 mg/dL (ref 0.0–1.2)
CO2: 22 mmol/L (ref 18–29)
Calcium: 9.2 mg/dL (ref 8.7–10.2)
Chloride: 99 mmol/L (ref 96–106)
Creatinine, Ser: 0.56 mg/dL — ABNORMAL LOW (ref 0.57–1.00)
GFR calc Af Amer: 140 mL/min/{1.73_m2} (ref >59)
GFR, EST NON AFRICAN AMERICAN: 121 mL/min/{1.73_m2} (ref >59)
GLOBULIN, TOTAL: 3.5 g/dL (ref 1.5–4.5)
Glucose: 92 mg/dL (ref 65–99)
POTASSIUM: 4.1 mmol/L (ref 3.5–5.2)
SODIUM: 138 mmol/L (ref 134–144)
Total Protein: 7.6 g/dL (ref 6.0–8.5)

## 2016-09-29 ENCOUNTER — Encounter (INDEPENDENT_AMBULATORY_CARE_PROVIDER_SITE_OTHER): Payer: Self-pay | Admitting: Physician Assistant

## 2016-09-29 ENCOUNTER — Other Ambulatory Visit (HOSPITAL_COMMUNITY)
Admission: RE | Admit: 2016-09-29 | Discharge: 2016-09-29 | Disposition: A | Discharge status: Home / Self Care | Payer: Self-pay | Source: Ambulatory Visit | Attending: Physician Assistant | Admitting: Physician Assistant

## 2016-09-29 ENCOUNTER — Ambulatory Visit (INDEPENDENT_AMBULATORY_CARE_PROVIDER_SITE_OTHER): Payer: Self-pay | Admitting: Physician Assistant

## 2016-09-29 VITALS — BP 112/79 | HR 66 | Temp 98.1°F | Resp 16 | Ht 58.66 in | Wt 156.0 lb

## 2016-09-29 DIAGNOSIS — R102 Pelvic and perineal pain: Secondary | ICD-10-CM

## 2016-09-29 DIAGNOSIS — Z124 Encounter for screening for malignant neoplasm of cervix: Secondary | ICD-10-CM

## 2016-09-29 DIAGNOSIS — Z23 Encounter for immunization: Secondary | ICD-10-CM

## 2016-09-29 LAB — POCT URINE PREGNANCY: Preg Test, Ur: NEGATIVE

## 2016-09-29 LAB — POCT URINALYSIS DIPSTICK
BILIRUBIN UA: NEGATIVE
Blood, UA: NEGATIVE
GLUCOSE UA: NEGATIVE
Ketones, UA: NEGATIVE
Nitrite, UA: NEGATIVE
Protein, UA: NEGATIVE
Spec Grav, UA: 1.015 (ref 1.010–1.025)
Urobilinogen, UA: 0.2 E.U./dL
pH, UA: 5.5 (ref 5.0–8.0)

## 2016-09-29 NOTE — Patient Instructions (Signed)
Fibromas uterinos  (Uterine Fibroids)  Los fibromas uterinos son masas (tumores) de tejido que pueden desarrollarse en el vientre (útero). También se los conoce como liomiomas. Este tipo de tumor no es canceroso (benigno) y no se disemina a otras partes del cuerpo fuera de la zona pélvica, la cual se encuentra entre los huesos de la cadera. En ocasiones, los fibromas pueden crecer en las trompas de [place], en el cuello del útero o en las estructuras de soporte (ligamentos) que rodean el útero.  Una mujer puede tener uno o más fibromas. Los fibromas pueden tener diferente tamaño y peso, y crecer en distintas partes del útero. Algunos pueden crecer hasta volverse bastante grandes. La mayoría no requiere tratamiento médico.  CAUSAS  Un fibroma puede desarrollarse cuando una única célula uterina continúa creciendo (se multiplica). La mayoría de las células del cuerpo humano tienen un mecanismo de control que impide que se multipliquen sin control.  SIGNOS Y SÍNTOMAS  Entre los síntomas se pueden incluir los siguientes:  · Hemorragias intensas durante la menstruación.  · Pérdidas de sangre o hemorragias entre los períodos.  · Dolor y opresión en la pelvis.  · Problemas de la vejiga, como necesidad de orinar con más frecuencia (polaquiuria) o necesidad imperiosa de orinar.  · Incapacidad para reproducir (infertilidad).  · Abortos espontáneos.  DIAGNÓSTICO  Los fibromas uterinos se diagnostican con un examen físico. El médico puede palpar los tumores grumosos durante un examen pélvico. Pueden realizarse ecografías y una resonancia magnética para determinar el tamaño y la ubicación de los fibromas, así como la cantidad.  TRATAMIENTO  El tratamiento puede incluir lo siguiente:  · Observación cautelosa. Esto requiere que el médico controle el fibroma para saber si crece o se achica. Siga las recomendaciones del médico respecto de la frecuencia con la que debe realizarse los controles.  · Medicamentos hormonales. Pueden  tomarse por vía oral o administrarse a través de un dispositivo intrauterino (DIU).  · Cirugía.  ? Extirpación de los fibromas (miomectomía) o del útero (histerectomía).  ? Suprimir la irrigación sanguínea a los fibromas (embolización de la arteria uterina).  Si los fibromas le traen problemas de fertilidad y tiene deseos de quedar embarazada, el médico puede recomendar su extirpación.  INSTRUCCIONES PARA EL CUIDADO EN EL HOGAR  · Concurra a todas las visitas de control como se lo haya indicado el médico. Esto es importante.  · Tome los medicamentos de venta libre y los recetados solamente como se lo haya indicado el médico.  ? Si le recetaron un tratamiento hormonal, tome los medicamentos hormonales exactamente como se lo indicaron.  · Consulte al médico sobre tomar comprimidos de hierro y aumentar la cantidad de verduras de hoja color verde oscuro en la dieta. Estas medidas pueden ayudar a incrementar los niveles de hierro en la sangre, que pueden verse afectados por las hemorragias menstruales intensas.  · Preste mucha atención a la menstruación e informe al médico si hay algún cambio, por ejemplo:  ? Aumento del flujo de sangre que le exige el uso de más compresas o tampones que los que utiliza normalmente cada mes.  ? Un cambio en la cantidad de días que le dura la menstruación cada mes.  ? Un cambio en los síntomas asociados con la menstruación, como cólicos abdominales o dolor de espalda.    SOLICITE ATENCIÓN MÉDICA SI:  · Tiene dolor pélvico, dolor de espalda o cólicos abdominales que los medicamentos no pueden controlar.  · Observa un aumento del sangrado entre y   durante las menstruaciones.  · Empapa los tampones o las compresas en el término de media hora o menos tiempo.  · Se siente mareada, muy cansada o débil.    SOLICITE ATENCIÓN MÉDICA DE INMEDIATO SI:  · Se desmaya.  · El dolor pélvico aumenta repentinamente.    Esta información no tiene como fin reemplazar el consejo del médico. Asegúrese de hacerle  al médico cualquier pregunta que tenga.  Document Released: 04/28/2005 Document Revised: 08/20/2015 Document Reviewed: 10/25/2013  Elsevier Interactive Patient Education © 2017 Elsevier Inc.

## 2016-09-29 NOTE — Progress Notes (Signed)
Subjective:  Patient ID: Kari Ortega, female    DOB: 1981-01-11  Age: 36 y.o. MRN: [id number]  CC: No chief complaint on file.   HPI Kari Ortega is a 36 y.o. female with a PMH of anemia and metrorrhagia presents with left sided adnexal and abdominal pain. Waxing and waning LLQ abdominal pain since approximately 10 days ago.  Pain currently 3/10 and greater in the night. Worse when wearing tight clothes and when laying supine. Notes unassociated urinary frequency. Denies nausea, vomiting, fever, chills, constipation, diarrhea, BRBPR, dysuria, urinary urgency, rash, CP, SOB, HA.  Has noted irregular menstrual spotting.    Outpatient Medications Prior to Visit  Medication Sig Dispense Refill  . cetirizine (ZYRTEC) 10 MG tablet Take 1 tablet (10 mg total) by mouth daily. 30 tablet 2  . cyclobenzaprine (FLEXERIL) 10 MG tablet Take 1 tablet (10 mg total) by mouth 3 (three) times daily as needed for muscle spasms. 30 tablet 0  . fexofenadine (ALLEGRA) 180 MG tablet Take 1 tablet (180 mg total) by mouth daily.    Marland Kitchen ibuprofen (ADVIL,MOTRIN) 600 MG tablet Take 1 tablet (600 mg total) by mouth every 6 (six) hours as needed for headache or moderate pain. 30 tablet 1  . mometasone (NASONEX) 50 MCG/ACT nasal spray 2 sprays each nostril once daily 17 g 12  . Multiple Vitamin (MULTIVITAMIN) tablet Take 1 tablet by mouth daily.    . naproxen (NAPROSYN) 500 MG tablet Take 1 tablet (500 mg total) by mouth 2 (two) times daily with a meal. 30 tablet 0  . Olopatadine HCl (PATADAY) 0.2 % SOLN 1 drop both eyes once daily 2.5 mL 11   No facility-administered medications prior to visit.      ROS Review of Systems  Constitutional: Negative for chills, fever and malaise/fatigue.  Eyes: Negative for blurred vision.  Respiratory: Negative for shortness of breath.   Cardiovascular: Negative for chest pain and palpitations.  Gastrointestinal: Positive for abdominal pain (LLQ).  Negative for nausea.  Genitourinary: Positive for frequency. Negative for dysuria and hematuria.  Musculoskeletal: Negative for joint pain and myalgias.  Skin: Negative for rash.  Neurological: Negative for tingling and headaches.  Psychiatric/Behavioral: Negative for depression. The patient is not nervous/anxious.     Objective:  BP 112/79 (BP Location: Left Arm, Patient Position: Sitting, Cuff Size: Normal)   Pulse 66   Temp 98.1 F (36.7 C) (Oral)   Resp 16   Ht 4' 10.66" (1.49 m)   Wt 156 lb (70.8 kg)   LMP 09/06/2016 (Exact Date)   SpO2 99%   BMI 31.87 kg/m   BP/Weight 09/29/2016 09/23/2016 2/37/6283  Systolic BP 151 761 607  Diastolic BP 79 74 80  Wt. (Lbs) 156 153.8 155  BMI 31.87 33.28 33.25      Physical Exam  Constitutional: She is oriented to person, place, and time.  Well developed, obese, NAD, polite  HENT:  Head: Normocephalic and atraumatic.  Eyes: No scleral icterus.  Neck: Normal range of motion. Neck supple. No thyromegaly present.  Cardiovascular: Normal rate, regular rhythm and normal heart sounds.   Pulmonary/Chest: Effort normal and breath sounds normal.  Abdominal: Soft. Bowel sounds are normal. There is tenderness (mild LLQ TTP).  Genitourinary:  Genitourinary Comments: Vagina normal, cervix normal, no cervical motion tenderness, mild left sided adnexal TTP, normal right sided adnexa. Left sided uterine TTP with mild enlargement to left side.   Musculoskeletal: She exhibits no edema.  Neurological: She is alert and oriented to  person, place, and time. Coordination normal.  Left sided facial paralysis  Skin: Skin is warm and dry. No rash noted. No erythema. No pallor.  Psychiatric: She has a normal mood and affect. Her behavior is normal. Thought content normal.  Vitals reviewed.    Assessment & Plan:   1. Suprapubic pain - Urinalysis Dipstick negative in clinic today - POCT urine pregnancy negative in clinic today - US Transvaginal Non-OB;  Future - US Pelvis Complete; Future  2. Screening for cervical cancer - Cytology - PAP [place]  3. Need for Tdap vaccination - Tdap vaccine greater than or equal to 7yo IM     Follow-up: Return in about 2 weeks (around 10/13/2016) for f/u suprapubic pain.   Clent Demark PA

## 2016-09-30 LAB — CYTOLOGY - PAP
Bacterial vaginitis: NEGATIVE
CHLAMYDIA, DNA PROBE: NEGATIVE
Candida vaginitis: NEGATIVE
DIAGNOSIS: NEGATIVE
Neisseria Gonorrhea: NEGATIVE
TRICH (WINDOWPATH): NEGATIVE

## 2016-10-03 LAB — CERVICOVAGINAL ANCILLARY ONLY: Herpes: NEGATIVE

## 2016-10-08 ENCOUNTER — Ambulatory Visit (INDEPENDENT_AMBULATORY_CARE_PROVIDER_SITE_OTHER): Payer: Self-pay | Admitting: Physician Assistant

## 2016-10-09 ENCOUNTER — Ambulatory Visit (HOSPITAL_COMMUNITY)
Admission: RE | Admit: 2016-10-09 | Discharge: 2016-10-09 | Disposition: A | Discharge status: Home / Self Care | Payer: Self-pay | Source: Ambulatory Visit | Attending: Physician Assistant | Admitting: Physician Assistant

## 2016-10-09 DIAGNOSIS — R102 Pelvic and perineal pain: Secondary | ICD-10-CM | POA: Insufficient documentation

## 2016-10-09 DIAGNOSIS — N83291 Other ovarian cyst, right side: Secondary | ICD-10-CM | POA: Insufficient documentation

## 2016-10-14 ENCOUNTER — Encounter (INDEPENDENT_AMBULATORY_CARE_PROVIDER_SITE_OTHER): Payer: Self-pay | Admitting: Physician Assistant

## 2016-10-14 ENCOUNTER — Ambulatory Visit (INDEPENDENT_AMBULATORY_CARE_PROVIDER_SITE_OTHER): Payer: Self-pay | Admitting: Physician Assistant

## 2016-10-14 VITALS — BP 115/79 | HR 75 | Temp 98.2°F | Wt 159.8 lb

## 2016-10-14 DIAGNOSIS — N644 Mastodynia: Secondary | ICD-10-CM

## 2016-10-14 DIAGNOSIS — R140 Abdominal distension (gaseous): Secondary | ICD-10-CM

## 2016-10-14 LAB — POCT URINE PREGNANCY: Preg Test, Ur: NEGATIVE

## 2016-10-14 MED ORDER — MELOXICAM 15 MG PO TABS: 15.0000 mg | ORAL_TABLET | Freq: Every day | ORAL | 0 refills | Status: DC

## 2016-10-14 NOTE — Progress Notes (Signed)
Subjective:  Patient ID: Kari Ortega, female    DOB: 1980-08-28  Age: 36 y.o. MRN: [id number]  CC: bilateral breast pain,   HPI Spenser Madahi Limon is a 36 y.o. female with a PMH of irregular periods and anemia presents with bilateral breast pain, mild pelvic pain, and one episode of nausea 4 days ago. She recently had an [place] which showed a normal uterus, normal left ovary, and a right ovarian simple cyst. Has no right sided pelvic pain.  Pt recently went to a masseuse and was told that her left sided pain was attributed to a "fallen uterus". The fallen uterus was put back in place and patient states her pain resolved. No other symptoms endorsed.       Outpatient Medications Prior to Visit  Medication Sig Dispense Refill  . cetirizine (ZYRTEC) 10 MG tablet Take 1 tablet (10 mg total) by mouth daily. 30 tablet 2  . cyclobenzaprine (FLEXERIL) 10 MG tablet Take 1 tablet (10 mg total) by mouth 3 (three) times daily as needed for muscle spasms. 30 tablet 0  . fexofenadine (ALLEGRA) 180 MG tablet Take 1 tablet (180 mg total) by mouth daily.    Marland Kitchen ibuprofen (ADVIL,MOTRIN) 600 MG tablet Take 1 tablet (600 mg total) by mouth every 6 (six) hours as needed for headache or moderate pain. 30 tablet 1  . mometasone (NASONEX) 50 MCG/ACT nasal spray 2 sprays each nostril once daily 17 g 12  . Multiple Vitamin (MULTIVITAMIN) tablet Take 1 tablet by mouth daily.    . Olopatadine HCl (PATADAY) 0.2 % SOLN 1 drop both eyes once daily 2.5 mL 11  . naproxen (NAPROSYN) 500 MG tablet Take 1 tablet (500 mg total) by mouth 2 (two) times daily with a meal. 30 tablet 0   No facility-administered medications prior to visit.      ROS Review of Systems  Constitutional: Negative for chills, fever and malaise/fatigue.  Eyes: Negative for blurred vision.  Respiratory: Negative for shortness of breath.   Cardiovascular: Negative for chest pain and palpitations.  Gastrointestinal: Negative for  abdominal pain and nausea.  Genitourinary: Negative for dysuria and hematuria.  Musculoskeletal: Negative for joint pain and myalgias.  Skin: Negative for rash.  Neurological: Negative for tingling and headaches.  Psychiatric/Behavioral: Negative for depression. The patient is not nervous/anxious.     Objective:  BP 115/79 (BP Location: Left Arm, Patient Position: Sitting, Cuff Size: Normal)   Pulse 75   Temp 98.2 F (36.8 C) (Oral)   Wt 159 lb 12.8 oz (72.5 kg)   LMP 10/02/2016 (Exact Date)   SpO2 98%   BMI 32.65 kg/m   BP/Weight 10/14/2016 09/29/2016 2/29/7989  Systolic BP 211 941 740  Diastolic BP 79 79 74  Wt. (Lbs) 159.8 156 153.8  BMI 32.65 31.87 33.28      Physical Exam  Constitutional: She is oriented to person, place, and time.  Well developed, obese, NAD, polite  HENT:  Head: Normocephalic and atraumatic.  Eyes: No scleral icterus.  Neck: Normal range of motion. Neck supple. No thyromegaly present.  Cardiovascular: Normal rate, regular rhythm and normal heart sounds.   Pulmonary/Chest: Effort normal and breath sounds normal.  Abdominal: Soft. Bowel sounds are normal. There is no tenderness.  Musculoskeletal: She exhibits no edema.  Neurological: She is alert and oriented to person, place, and time.  Skin: Skin is warm and dry. No rash noted. No erythema. No pallor.  Breasts without erythema, edema, discharge, or skin texture changes.  Psychiatric: She has a normal mood and affect. Her behavior is normal. Thought content normal.  Vitals reviewed.    Assessment & Plan:   1. Breast tenderness in female - POCT urine pregnancy negative. - Likely premenstrual - meloxicam (MOBIC) 15 MG tablet; Take 1 tablet (15 mg total) by mouth daily.  Dispense: 30 tablet; Refill: 0  2. Abdominal bloating - Advised to observe bowel habits and diet to look for lactose intolerance, gluten sensitivity, constipation, diarrhea, blood in stool, fecal urgency, fever, or any other  symptoms. Advised to reduce carbonated beverages and to limit talking while eating.    Meds ordered this encounter  Medications  . meloxicam (MOBIC) 15 MG tablet    Sig: Take 1 tablet (15 mg total) by mouth daily.    Dispense:  30 tablet    Refill:  0    Order Specific Question:   Supervising Provider    Answer:   Tresa Garter [id number]    Follow-up: Return if symptoms worsen or fail to improve.   Clent Demark PA

## 2016-10-14 NOTE — Patient Instructions (Signed)
Sndrome premenstrual (Premenstrual Syndrome) El sndrome premenstrual es un conjunto de sntomas fsicos, emocionales y del comportamiento que afectan a las mujeres en edad frtil. Comienza 1 o 2semanas antes del inicio de un perodo menstrual y [place] despus de que este [place]. Suele reaparecer con un patrn predecible. Puede ser leve o grave. Cuando es grave, se lo conoce como trastorno disfrico premenstrual. El sndrome premenstrual puede interferir de Education officer, environmental en las actividades diarias normales. CAUSAS La causa exacta del sndrome premenstrual es desconocida, pero parece estar relacionado con cambios hormonales que ocurren antes de Hydrographic surveyor. SNTOMAS Los sntomas de este trastorno suelen [employer]. Desaparecen por completo despus del inicio del ciclo menstrual. Los sntomas fsicos incluyen los siguientes:  Meteorismo.  Dolor en las [place].  Dolores de [place].  Fatiga extrema.  Dolor de espalda.  [place] y los pies.  Aumento de [place].  Acaloramiento. Los sntomas emocionales y del comportamiento incluyen los siguientes:  Cambios en el estado de nimo.  Depresin.  Ataques de ira.  Irritabilidad.  Ansiedad.  Ataques de llanto.  Antojos de comida o cambios del apetito.  Cambios en el deseo sexual.  Confusin.  Agresin.  Aislamiento social.  Falta de concentracin. DIAGNSTICO Este trastorno se diagnostica si los sntomas del sndrome premenstrual:  Estn presentes los 5das previos al inicio de la [place].  Finalizar 4 das despus de que comienza el perodo.  Ocurren por lo menos 31meses seguidos.  Interferir con algunas de sus actividades normales. Se deben descartar otras afecciones que pueden causar algunos de estos sntomas, antes de diagnosticar el sndrome premenstrual. TRATAMIENTO El tratamiento de este trastorno puede incluir lo siguiente:  Catering manager un estilo de vida  saludable. Esto incluye comer una dieta equilibrada y hacer actividad fsica habitualmente.  Tomar medicamentos. Los medicamentos pueden ayudar a [employer], [employer] clicos, las [place], los dolores, los dolores de [place] y la sensibilidad en las [place]. En funcin de la gravedad del trastorno, el mdico puede recomendarle lo siguiente: ? Analgsicos de [employer]. ? Medicamentos recetados para el trastorno disfrico premenstrual. INSTRUCCIONES PARA EL CUIDADO EN EL HOGAR Comida y bebida  Mantenga una dieta bien balanceada.  Evite la cafena y el alcohol.  Limite la cantidad de sal y de alimentos salados que consume. Esto ayudar a Audiological scientist.  Beba suficiente lquido para Consulting civil engineer orina clara o de color amarillo plido.  Tome una multivitamina, si se lo indic el mdico. Estilo de vida  No consuma ningn producto que contenga tabaco, lo que incluye cigarrillos, tabaco de Higher education careers adviser y Psychologist, sport and exercise. Si necesita ayuda para dejar de fumar, consulte al mdico.  Haga actividad fsica habitualmente como se lo haya sugerido el mdico.  Duerma lo suficiente.  Utilice tcnicas de relajacin.  Limite el estrs. Otras indicaciones  Durante 2 o 3 meses escriba sus sntomas, su gravedad y cunto duran. Esto ayudar al mdico a elegir el tratamiento ms adecuado para usted.  Tome los medicamentos de venta libre y los recetados solamente como se lo haya indicado el mdico.  Si toma anticonceptivos, tmelos como se lo haya indicado el mdico. Esta informacin no tiene Marine scientist el consejo del mdico. Asegrese de hacerle al mdico cualquier pregunta que tenga. Document Released: 02/05/2005 Document Revised: 01/21/2012 Document Reviewed: 01/26/2015 Elsevier Interactive Patient Education  [employer].

## 2016-10-20 ENCOUNTER — Encounter (INDEPENDENT_AMBULATORY_CARE_PROVIDER_SITE_OTHER): Payer: Self-pay | Admitting: Physician Assistant

## 2016-10-20 ENCOUNTER — Ambulatory Visit (INDEPENDENT_AMBULATORY_CARE_PROVIDER_SITE_OTHER): Payer: Self-pay | Admitting: Physician Assistant

## 2016-10-20 VITALS — BP 131/92 | HR 78 | Temp 98.6°F | Wt 159.6 lb

## 2016-10-20 DIAGNOSIS — N912 Amenorrhea, unspecified: Secondary | ICD-10-CM

## 2016-10-20 DIAGNOSIS — N979 Female infertility, unspecified: Secondary | ICD-10-CM

## 2016-10-20 LAB — POCT URINE PREGNANCY: PREG TEST UR: NEGATIVE

## 2016-10-20 MED ORDER — CLOMIPHENE CITRATE 50 MG PO TABS: 50.0000 mg | ORAL_TABLET | Freq: Every day | ORAL | 0 refills | Status: DC

## 2016-10-20 NOTE — Progress Notes (Signed)
  Subjective:  Patient ID: Kari Ortega, female    DOB: January 03, 1981  Age: 36 y.o. MRN: [id number]  CC: infertility  HPI Kari Ortega is a 36 y.o. female with a PMH of infertility presents to start Clomiphene. Has two children and would like another. She had taken Clomiphene twice before and resulted in two pregnancies. Has always had trouble with irregular menstrual cycles since menarche. Her LMP was September 02, 2016. Had an episode of mild left sided pelvic pain when lifting a heavy object a few days ago. Does not endorse an other symptoms or complaints.    Outpatient Medications Prior to Visit  Medication Sig Dispense Refill  . cetirizine (ZYRTEC) 10 MG tablet Take 1 tablet (10 mg total) by mouth daily. 30 tablet 2  . cyclobenzaprine (FLEXERIL) 10 MG tablet Take 1 tablet (10 mg total) by mouth 3 (three) times daily as needed for muscle spasms. 30 tablet 0  . fexofenadine (ALLEGRA) 180 MG tablet Take 1 tablet (180 mg total) by mouth daily.    Marland Kitchen ibuprofen (ADVIL,MOTRIN) 600 MG tablet Take 1 tablet (600 mg total) by mouth every 6 (six) hours as needed for headache or moderate pain. 30 tablet 1  . meloxicam (MOBIC) 15 MG tablet Take 1 tablet (15 mg total) by mouth daily. 30 tablet 0  . mometasone (NASONEX) 50 MCG/ACT nasal spray 2 sprays each nostril once daily 17 g 12  . Multiple Vitamin (MULTIVITAMIN) tablet Take 1 tablet by mouth daily.    . Olopatadine HCl (PATADAY) 0.2 % SOLN 1 drop both eyes once daily 2.5 mL 11   No facility-administered medications prior to visit.      ROS Review of Systems  Constitutional: Negative for chills, fever and malaise/fatigue.  Eyes: Negative for blurred vision.  Respiratory: Negative for shortness of breath.   Cardiovascular: Negative for chest pain and palpitations.  Gastrointestinal: Negative for abdominal pain and nausea.  Genitourinary: Negative for dysuria and hematuria.  Musculoskeletal: Negative for joint pain and  myalgias.  Skin: Negative for rash.  Neurological: Negative for tingling and headaches.  Psychiatric/Behavioral: Negative for depression. The patient is not nervous/anxious.     Objective:  BP (!) 131/92 (BP Location: Right Arm, Patient Position: Sitting, Cuff Size: Normal)   Pulse 78   Temp 98.6 F (37 C) (Oral)   Wt 159 lb 9.6 oz (72.4 kg)   LMP 10/02/2016 (Exact Date)   SpO2 98%   BMI 32.61 kg/m   BP/Weight 10/20/2016 10/14/2016 10/16/3014  Systolic BP 010 932 355  Diastolic BP 92 79 79  Wt. (Lbs) 159.6 159.8 156  BMI 32.61 32.65 31.87      Physical Exam  Constitutional: She is oriented to person, place, and time.  Well developed, obese, NAD, polite  HENT:  Head: Normocephalic and atraumatic.  Eyes: No scleral icterus.  Cardiovascular: Normal rate, regular rhythm and normal heart sounds.   Pulmonary/Chest: Effort normal and breath sounds normal.  Neurological: She is alert and oriented to person, place, and time.  Psychiatric: She has a normal mood and affect. Her behavior is normal. Thought content normal.  Vitals reviewed.    Assessment & Plan:   1. Amenorrhea - POCT urine pregnancy negative in clinic today  2. Infertility, female - Begin clomiphene 50mg  x5 days. Begin 5 days after menstrual period. Copulate 5-10 days after the fifth tablet of Clomiphene.    Follow-up: 30 days  Clent Demark PA

## 2016-10-20 NOTE — Patient Instructions (Signed)
Clomiphene tablets Qu es este medicamento? El CLOMIFENO es un medicamento para la fertilidad que se [place] para aumentar la posibilidad de [place]. [place] para estimular Nurse, children's (producir un huevo maduro) adecuada durante el ciclo de la [place]. Este medicamento puede ser utilizado para otros usos; si tiene alguna pregunta consulte con su proveedor de atencin mdica o con su farmacutico. MARCAS COMUNES: Clomid, Serophene Qu le debo informar a mi profesional de la salud antes de tomar este medicamento? Necesita saber si usted presenta alguno de los siguientes problemas o situaciones: -enfermedad de la glndula suprarrenal -enfermedad vascular, trastorno de coagulacin sangunea -quiste en los ovarios -endometriosis -enfermedad heptica -carcinoma de ovario -enfermedad de la glndula pituitaria -sangrado vaginal que no ha sido evaluado -una reaccin alrgica o inusual al clomifeno, a otros medicamentos, alimentos, colorantes o conservantes -si est embarazada (no debe usar si est embarazada) -si est amamantando a un beb Cmo debo utilizar este medicamento? Tome este medicamento por va oral con un vaso de agua. Siga las instrucciones de la etiqueta del [place]. Tomar exactamente segn se indica y durante el nmero exacto de das para los que fue recetado. Tome sus dosis a intervalos regulares. La [employer] de las mujeres toman este medicamento durante un perodo de 5 [place], Armed forces training and education officer la duracin del tratamiento puede ajustarse en algunos casos. Su mdico le Conservation officer, nature en que debe empezar a tomar este medicamento y Ship broker las indicaciones para Tour manager. No tome su medicamento con una frecuencia mayor que la indicada. Hable con su pediatra para informarse acerca del uso de este medicamento en nios. Puede requerir atencin especial. Sobredosis: Pngase en contacto inmediatamente con un centro toxicolgico o una sala de urgencia si usted cree que haya tomado  demasiado medicamento. ATENCIN: [employer] es solo para usted. No comparta este medicamento con nadie. Qu sucede si me olvido de una dosis? Si olvida una dosis, tmela lo antes posible. Si es casi la hora de la prxima dosis, tome slo esa dosis. No tome dosis adicionales o dobles. Qu puede interactuar con este medicamento? -suplementos a base de hierbas o dietticos como cohosh azul, cohosh negro, vitex o DHEA -prasterona Puede ser que esta lista no menciona todas las posibles interacciones. Informe a su profesional de [employer]	[place] de [employer] productos a base de hierbas, medicamentos de [place] o suplementos nutritivos que est tomando. Si usted fuma, consume bebidas alcohlicas o si utiliza drogas ilegales, indqueselo tambin a su profesional de [employer]	[place]. Algunas sustancias pueden interactuar con su medicamento. A qu debo estar atento al usar Coca-Cola? Asegrese que usted sepa cmo y cundo usar Coca-Cola. Debe saber cundo est ovulando y cundo debe Boeing sexuales a fin de incrementar las posibilidades de quedar [place]. Visite a su mdico o a su profesional de la salud para chequear su evolucin peridicamente. Es posible que deba controlar sus niveles de hormonas en la sangre o que le indique alguna prueba de orina domiciliaria para Aeronautical engineer ovulacin. Trate de no faltar a las citas. En comparacin con otros tratamientos para la fertilidad, este medicamento no aumenta mucho la posibilidad de Best boy un Geneticist, molecular. Aproximadamente 5 de cada 100 mujeres que toman este medicamento tienen la posibilidad de quedar embarazadas con [employer]. Si piensa que est embarazada deje de tomar este medicamento de inmediato y comunquese con su mdico o con su profesional de Technical sales engineer. Este medicamento no es para tratamientos a Barrister's clerk. La mayora de las mujeres que se benefician  del uso de este medicamento obtienen resultados dentro de los tres primeros  ciclos (meses). Su mdico o su profesional de Theatre manager a Multimedia programmer. Este medicamento generalmente se utiliza durante no ms de 6 ciclos de [place]. Puede experimentar mareos o somnolencia. No conduzca ni utilice maquinaria ni haga nada que Associate Professor en estado de alerta hasta que sepa cmo le afecta este medicamento. No se siente ni se ponga de pie con rapidez. Esto reduce el riesgo de mareos o [employer]. El consumir bebidas alcohlicas o el fumar tabaco puede disminuir las posibilidades de [place]. Limitar o dejar de consumir alcohol o el uso de tabaco durante su tratamiento para la fertilidad. Qu efectos secundarios puedo tener al [employer] este medicamento? Efectos secundarios que debe informar a su mdico o a Barrister's clerk de la salud tan pronto como sea posible: -Chief of Staff como erupcin cutnea, picazn o urticarias, hinchazn de la cara, labios o lengua -problemas respiratorios -cambios en la visin -retencin de lquidos -nuseas, vmito -hinchazn o dolor plvico -dolor abdominal severo -aumento de peso repentino Efectos secundarios que, por lo general, no requieren atencin mdica (debe informarlos a su mdico o a su profesional de la salud si persisten o si son molestos): -molestia en las mamas -sofocos -molestia plvica leve -nuseas leves Puede ser que esta lista no menciona todos los posibles efectos secundarios. Comunquese a su mdico por asesoramiento mdico [employer] Inc. Usted puede informar los efectos secundarios a la FDA por telfono al 1-800-FDA-1088. Dnde debo guardar mi medicina? Mantngala fuera del alcance de los nios. Gurdela a [employer], entre 15 y 60 grados C (39 y 27 grados F). Protjala del calor, la luz y la humedad. Deseche todo el medicamento que no haya utilizado, despus de la fecha de vencimiento. ATENCIN: Este folleto es un resumen. Puede ser que no cubra toda la posible  informacin. Si usted tiene preguntas acerca de esta medicina, consulte con su mdico, su farmacutico o su profesional de Technical sales engineer.  2018 Elsevier/Gold Standard (2014-06-20 00:00:00)

## 2016-10-21 ENCOUNTER — Ambulatory Visit (INDEPENDENT_AMBULATORY_CARE_PROVIDER_SITE_OTHER): Payer: Self-pay | Admitting: Physician Assistant

## 2016-10-27 ENCOUNTER — Telehealth (INDEPENDENT_AMBULATORY_CARE_PROVIDER_SITE_OTHER): Payer: Self-pay | Admitting: Physician Assistant

## 2016-10-27 NOTE — Telephone Encounter (Signed)
PCP will discuss at scheduled appointment tomorrow. Nat Christen, CMA

## 2016-10-27 NOTE — Telephone Encounter (Addendum)
Patient has concerns with medication schedule appt for 10/28/2016 stated starting taking medication  and her period started and would like for Domenica Fail PA to call her back.  Please follow up with patient.

## 2016-10-28 ENCOUNTER — Ambulatory Visit (INDEPENDENT_AMBULATORY_CARE_PROVIDER_SITE_OTHER): Payer: Self-pay | Admitting: Physician Assistant

## 2016-10-28 ENCOUNTER — Encounter (INDEPENDENT_AMBULATORY_CARE_PROVIDER_SITE_OTHER): Payer: Self-pay | Admitting: Physician Assistant

## 2016-10-28 DIAGNOSIS — N979 Female infertility, unspecified: Secondary | ICD-10-CM

## 2016-10-28 MED ORDER — CLOMIPHENE CITRATE 50 MG PO TABS: 100.0000 mg | ORAL_TABLET | Freq: Every day | ORAL | 0 refills | Status: DC

## 2016-10-28 NOTE — Progress Notes (Signed)
Subjective:  Patient ID: Kari Ortega, female    DOB: [birth year]  Age: 36 y.o. MRN: [id number]  CC: concern with clomiphene  HPI Kari Ortega is a 36 y.o. female with a PMH of metrorrhagia presents with concern about clomiphene use. Says that she took clomiphene for 4 days and her menstrual period began. She is concerned of any possible long term effect. Does not feel any different. Currently menstruating. Does not endorse any other symptoms.        Outpatient Medications Prior to Visit  Medication Sig Dispense Refill  . cetirizine (ZYRTEC) 10 MG tablet Take 1 tablet (10 mg total) by mouth daily. 30 tablet 2  . cyclobenzaprine (FLEXERIL) 10 MG tablet Take 1 tablet (10 mg total) by mouth 3 (three) times daily as needed for muscle spasms. 30 tablet 0  . fexofenadine (ALLEGRA) 180 MG tablet Take 1 tablet (180 mg total) by mouth daily.    Marland Kitchen ibuprofen (ADVIL,MOTRIN) 600 MG tablet Take 1 tablet (600 mg total) by mouth every 6 (six) hours as needed for headache or moderate pain. 30 tablet 1  . meloxicam (MOBIC) 15 MG tablet Take 1 tablet (15 mg total) by mouth daily. 30 tablet 0  . mometasone (NASONEX) 50 MCG/ACT nasal spray 2 sprays each nostril once daily 17 g 12  . Multiple Vitamin (MULTIVITAMIN) tablet Take 1 tablet by mouth daily.    . Olopatadine HCl (PATADAY) 0.2 % SOLN 1 drop both eyes once daily 2.5 mL 11  . clomiPHENE (CLOMID) 50 MG tablet Take 1 tablet (50 mg total) by mouth daily. 5 tablet 0   No facility-administered medications prior to visit.      ROS Review of Systems  Constitutional: Negative for chills, fever and malaise/fatigue.  Eyes: Negative for blurred vision.  Respiratory: Negative for shortness of breath.   Cardiovascular: Negative for chest pain and palpitations.  Gastrointestinal: Negative for abdominal pain and nausea.  Genitourinary: Negative for dysuria and hematuria.  Musculoskeletal: Negative for joint pain and myalgias.   Skin: Negative for rash.  Neurological: Negative for tingling and headaches.  Psychiatric/Behavioral: Negative for depression. The patient is not nervous/anxious.     Objective:  BP 105/72 (BP Location: Left Arm, Patient Position: Sitting, Cuff Size: Normal)   Pulse 75   Temp 98.4 F (36.9 C) (Oral)   Wt 159 lb (72.1 kg)   LMP 10/02/2016 (Exact Date)   SpO2 99%   BMI 32.49 kg/m   BP/Weight 10/28/2016 8/67/6720 01/14/7095  Systolic BP 283 662 947  Diastolic BP 72 92 79  Wt. (Lbs) 159 159.6 159.8  BMI 32.49 32.61 32.65      Physical Exam  Constitutional: She is oriented to person, place, and time.  Well developed, overweight, NAD, polite  HENT:  Head: Normocephalic and atraumatic.  Eyes: No scleral icterus.  Musculoskeletal: She exhibits no edema.  Neurological: She is alert and oriented to person, place, and time.  Skin: Skin is warm and dry. No rash noted. No erythema. No pallor.  Psychiatric: She has a normal mood and affect. Her behavior is normal. Thought content normal.  Vitals reviewed.    Assessment & Plan:   1. Infertility, female - Begin clomiPHENE (CLOMID) 50 MG tablet; Take 2 tablets (100 mg total) by mouth daily.  Dispense: 10 tablet; Refill: 0. Advised patient to begin next month if needed. She is to wait 5 days after her last day of menstrual bleeding before she takes clomiphene. She is to take clomiphene  5 consecutive days. Then she waits 5-10 days to have sex.  - I have advised that she begin taking her prenatal vitamins. Patient states she has a bottle of prenatal vitamins at home and will begin taking them.  Meds ordered this encounter  Medications  . DISCONTD: clomiPHENE (CLOMID) 50 MG tablet    Sig: Take 2 tablets (100 mg total) by mouth daily.    Dispense:  10 tablet    Refill:  0    Order Specific Question:   Supervising Provider    Answer:   Tresa Garter [id number]  . clomiPHENE (CLOMID) 50 MG tablet    Sig: Take 2 tablets (100 mg  total) by mouth daily.    Dispense:  10 tablet    Refill:  0    Order Specific Question:   Supervising Provider    Answer:   Tresa Garter [[id number]]    Follow-up: as needed  Clent Demark PA

## 2016-12-22 ENCOUNTER — Ambulatory Visit (INDEPENDENT_AMBULATORY_CARE_PROVIDER_SITE_OTHER): Payer: Self-pay | Admitting: Physician Assistant

## 2016-12-22 ENCOUNTER — Encounter (INDEPENDENT_AMBULATORY_CARE_PROVIDER_SITE_OTHER): Payer: Self-pay | Admitting: Physician Assistant

## 2016-12-22 VITALS — BP 121/84 | HR 79 | Temp 98.2°F | Wt 162.6 lb

## 2016-12-22 DIAGNOSIS — G44209 Tension-type headache, unspecified, not intractable: Secondary | ICD-10-CM

## 2016-12-22 DIAGNOSIS — N979 Female infertility, unspecified: Secondary | ICD-10-CM

## 2016-12-22 LAB — POCT URINE PREGNANCY: Preg Test, Ur: NEGATIVE

## 2016-12-22 MED ORDER — CLOMIPHENE CITRATE 50 MG PO TABS: 100.0000 mg | ORAL_TABLET | Freq: Every day | ORAL | 0 refills | Status: DC

## 2016-12-22 MED ORDER — MELOXICAM 15 MG PO TABS: 15.0000 mg | ORAL_TABLET | Freq: Every day | ORAL | 0 refills | Status: DC

## 2016-12-22 MED ORDER — CYCLOBENZAPRINE HCL 10 MG PO TABS: 10.0000 mg | ORAL_TABLET | Freq: Every day | ORAL | 0 refills | Status: DC

## 2016-12-22 NOTE — Progress Notes (Signed)
Subjective:  Patient ID: Kari Ortega, female    DOB: 1981-04-18  Age: 36 y.o. MRN: [id number]  CC: headache  HPI Kari Ortega is a 36 y.o. female with a PMH of anemia, irregular periods, and headache presents with complaint of headache. She was seen here on 09/23/16 and diagnosed with tension type headache. Prescribed cyclobenzaprine and naproxen. CBC at the time revealed mildly elevated eos. CMP normal. Patient states cyclobenzaprine and naproxen were effective at relieving the pain but has run out. He headache is frontal exclusively. Did have an episode of associated right upper trapezius pain. Happens mostly when she wakes up and occasionally throughout the day. No headache currently.  Has been taking OTC Tylenol with relief of the headache. Denies any other associated symptoms. Thinks headaches may be attributed to pregnancy as she is trying to conceive. Has been prescribed Clomiphene to help with ovulation. Does not endorse any other complaints or symptoms.   Outpatient Medications Prior to Visit  Medication Sig Dispense Refill  . cetirizine (ZYRTEC) 10 MG tablet Take 1 tablet (10 mg total) by mouth daily. 30 tablet 2  . clomiPHENE (CLOMID) 50 MG tablet Take 2 tablets (100 mg total) by mouth daily. 10 tablet 0  . cyclobenzaprine (FLEXERIL) 10 MG tablet Take 1 tablet (10 mg total) by mouth 3 (three) times daily as needed for muscle spasms. 30 tablet 0  . fexofenadine (ALLEGRA) 180 MG tablet Take 1 tablet (180 mg total) by mouth daily.    Marland Kitchen ibuprofen (ADVIL,MOTRIN) 600 MG tablet Take 1 tablet (600 mg total) by mouth every 6 (six) hours as needed for headache or moderate pain. 30 tablet 1  . meloxicam (MOBIC) 15 MG tablet Take 1 tablet (15 mg total) by mouth daily. 30 tablet 0  . mometasone (NASONEX) 50 MCG/ACT nasal spray 2 sprays each nostril once daily 17 g 12  . Multiple Vitamin (MULTIVITAMIN) tablet Take 1 tablet by mouth daily.    . Olopatadine HCl  (PATADAY) 0.2 % SOLN 1 drop both eyes once daily 2.5 mL 11   No facility-administered medications prior to visit.      ROS Review of Systems  Constitutional: Negative for chills, fever and malaise/fatigue.  Eyes: Negative for blurred vision.  Respiratory: Negative for shortness of breath.   Cardiovascular: Negative for chest pain and palpitations.  Gastrointestinal: Negative for abdominal pain and nausea.  Genitourinary: Negative for dysuria and hematuria.  Musculoskeletal: Negative for joint pain and myalgias.  Skin: Negative for rash.  Neurological: Positive for headaches. Negative for tingling.  Psychiatric/Behavioral: Negative for depression. The patient is not nervous/anxious.     Objective:  BP 121/84 (BP Location: Left Arm, Patient Position: Sitting, Cuff Size: Normal)   Pulse 79   Temp 98.2 F (36.8 C) (Oral)   Wt 162 lb 9.6 oz (73.8 kg)   LMP 11/26/2016 (Exact Date)   SpO2 97%   BMI 33.22 kg/m   BP/Weight 12/22/2016 10/28/2016 08/23/2438  Systolic BP 102 725 366  Diastolic BP 84 72 92  Wt. (Lbs) 162.6 159 159.6  BMI 33.22 32.49 32.61      Physical Exam  Constitutional: She is oriented to person, place, and time.  Well developed, obese, NAD, polite  HENT:  Head: Normocephalic and atraumatic.  Eyes: No scleral icterus.  Neck: Normal range of motion. Neck supple. No thyromegaly present.  Cardiovascular: Normal rate, regular rhythm and normal heart sounds.   Pulmonary/Chest: Effort normal and breath sounds normal.  Musculoskeletal: She exhibits no  edema.  Neurological: She is alert and oriented to person, place, and time.  Skin: Skin is warm and dry. No rash noted. No erythema. No pallor.  Psychiatric: She has a normal mood and affect. Her behavior is normal. Thought content normal.  Vitals reviewed.    Assessment & Plan:    1. Tension-type headache, not intractable, unspecified chronicity pattern - meloxicam (MOBIC) 15 MG tablet; Take 1 tablet (15 mg  total) by mouth daily.  Dispense: 30 tablet; Refill: 0 - cyclobenzaprine (FLEXERIL) 10 MG tablet; Take 1 tablet (10 mg total) by mouth at bedtime.  Dispense: 10 tablet; Refill: 0  2. Infertility, female - Ambulatory referral to Infertility - POCT urine pregnancy - clomiPHENE (CLOMID) 50 MG tablet; Take 2 tablets (100 mg total) by mouth daily.  Dispense: 10 tablet; Refill: 0. Third and last prescription. Pt believes her menstrual cycle may be becoming more regular but is unsure. Regardless, pt will be sent to infertility for evaluation.    Meds ordered this encounter  Medications  . DISCONTD: cyclobenzaprine (FLEXERIL) 10 MG tablet    Sig: Take 1 tablet (10 mg total) by mouth at bedtime.    Dispense:  10 tablet    Refill:  0    Order Specific Question:   Supervising Provider    Answer:   Tresa Garter [id number]  . DISCONTD: meloxicam (MOBIC) 15 MG tablet    Sig: Take 1 tablet (15 mg total) by mouth daily.    Dispense:  30 tablet    Refill:  0    Order Specific Question:   Supervising Provider    Answer:   Tresa Garter [id number]  . DISCONTD: clomiPHENE (CLOMID) 50 MG tablet    Sig: Take 2 tablets (100 mg total) by mouth daily.    Dispense:  10 tablet    Refill:  0    Order Specific Question:   Supervising Provider    Answer:   Tresa Garter [id number]  . meloxicam (MOBIC) 15 MG tablet    Sig: Take 1 tablet (15 mg total) by mouth daily.    Dispense:  30 tablet    Refill:  0    Order Specific Question:   Supervising Provider    Answer:   Tresa Garter [id number]  . clomiPHENE (CLOMID) 50 MG tablet    Sig: Take 2 tablets (100 mg total) by mouth daily.    Dispense:  10 tablet    Refill:  0    Order Specific Question:   Supervising Provider    Answer:   Tresa Garter [id number]  . cyclobenzaprine (FLEXERIL) 10 MG tablet    Sig: Take 1 tablet (10 mg total) by mouth at bedtime.    Dispense:  10 tablet    Refill:  0    Order Specific Question:    Supervising Provider    Answer:   Tresa Garter [id number]    Follow-up: Return if symptoms worsen or fail to improve.   Clent Demark PA

## 2016-12-22 NOTE — Patient Instructions (Signed)
Clomiphene tablets Qu es este medicamento? El CLOMIFENO es un medicamento para la fertilidad que se [place] para aumentar la posibilidad de [place]. [place] para estimular Nurse, children's (producir un huevo maduro) adecuada durante el ciclo de la [place]. Este medicamento puede ser utilizado para otros usos; si tiene alguna pregunta consulte con su proveedor de atencin mdica o con su farmacutico. MARCAS COMUNES: Clomid, Serophene Qu le debo informar a mi profesional de la salud antes de tomar este medicamento? Necesita saber si usted presenta alguno de los siguientes problemas o situaciones: -enfermedad de la glndula suprarrenal -enfermedad vascular, trastorno de coagulacin sangunea -quiste en los ovarios -endometriosis -enfermedad heptica -carcinoma de ovario -enfermedad de la glndula pituitaria -sangrado vaginal que no ha sido evaluado -una reaccin alrgica o inusual al clomifeno, a otros medicamentos, alimentos, colorantes o conservantes -si est embarazada (no debe usar si est embarazada) -si est amamantando a un beb Cmo debo utilizar este medicamento? Tome este medicamento por va oral con un vaso de agua. Siga las instrucciones de la etiqueta del Farmer. Tomar exactamente segn se indica y durante el nmero exacto de das para los que fue recetado. Tome sus dosis a intervalos regulares. La [employer] de las mujeres toman este medicamento durante un perodo de 5 [place], Armed forces training and education officer la duracin del tratamiento puede ajustarse en algunos casos. Su mdico le Conservation officer, nature en que debe empezar a tomar este medicamento y Ship broker las indicaciones para Tour manager. No tome su medicamento con una frecuencia mayor que la indicada. Hable con su pediatra para informarse acerca del uso de este medicamento en nios. Puede requerir atencin especial. Sobredosis: Pngase en contacto inmediatamente con un centro toxicolgico o una sala de urgencia si usted cree que haya tomado  demasiado medicamento. ATENCIN: [employer] es solo para usted. No comparta este medicamento con nadie. Qu sucede si me olvido de una dosis? Si olvida una dosis, tmela lo antes posible. Si es casi la hora de la prxima dosis, tome slo esa dosis. No tome dosis adicionales o dobles. Qu puede interactuar con este medicamento? -suplementos a base de hierbas o dietticos como cohosh azul, cohosh negro, vitex o DHEA -prasterona Puede ser que esta lista no menciona todas las posibles interacciones. Informe a su profesional de [employer]	[place] de [employer] productos a base de hierbas, medicamentos de [place] o suplementos nutritivos que est tomando. Si usted fuma, consume bebidas alcohlicas o si utiliza drogas ilegales, indqueselo tambin a su profesional de KB Home	[place]. Algunas sustancias pueden interactuar con su medicamento. A qu debo estar atento al usar Coca-Cola? Asegrese que usted sepa cmo y cundo usar Coca-Cola. Debe saber cundo est ovulando y cundo debe Boeing sexuales a fin de incrementar las posibilidades de quedar [place]. Visite a su mdico o a su profesional de la salud para chequear su evolucin peridicamente. Es posible que deba controlar sus niveles de hormonas en la sangre o que le indique alguna prueba de orina domiciliaria para Aeronautical engineer ovulacin. Trate de no faltar a las citas. En comparacin con otros tratamientos para la fertilidad, este medicamento no aumenta mucho la posibilidad de Best boy un Geneticist, molecular. Aproximadamente 5 de cada 100 mujeres que toman este medicamento tienen la posibilidad de quedar embarazadas con [employer]. Si piensa que est embarazada deje de tomar este medicamento de inmediato y comunquese con su mdico o con su profesional de Technical sales engineer. Este medicamento no es para tratamientos a Barrister's clerk. La mayora de las mujeres que se benefician  del uso de este medicamento obtienen resultados dentro de los tres primeros  ciclos (meses). Su mdico o su profesional de Theatre manager a Multimedia programmer. Este medicamento generalmente se utiliza durante no ms de 6 ciclos de [place]. Puede experimentar mareos o somnolencia. No conduzca ni utilice maquinaria ni haga nada que Associate Professor en estado de alerta hasta que sepa cmo le afecta este medicamento. No se siente ni se ponga de pie con rapidez. Esto reduce el riesgo de mareos o [employer]. El consumir bebidas alcohlicas o el fumar tabaco puede disminuir las posibilidades de [place]. Limitar o dejar de consumir alcohol o el uso de tabaco durante su tratamiento para la fertilidad. Qu efectos secundarios puedo tener al [employer] Corporation este medicamento? Efectos secundarios que debe informar a su mdico o a Barrister's clerk de la salud tan pronto como sea posible: -Chief of Staff como erupcin cutnea, picazn o urticarias, hinchazn de la cara, labios o lengua -problemas respiratorios -cambios en la visin -retencin de lquidos -nuseas, vmito -hinchazn o dolor plvico -dolor abdominal severo -aumento de peso repentino Efectos secundarios que, por lo general, no requieren atencin mdica (debe informarlos a su mdico o a su profesional de la salud si persisten o si son molestos): -molestia en las mamas -sofocos -molestia plvica leve -nuseas leves Puede ser que esta lista no menciona todos los posibles efectos secundarios. Comunquese a su mdico por asesoramiento mdico Humana Inc. Usted puede informar los efectos secundarios a la FDA por telfono al 1-800-FDA-1088. Dnde debo guardar mi medicina? Mantngala fuera del alcance de los nios. Gurdela a [employer], entre 15 y 67 grados C (65 y 65 grados F). Protjala del calor, la luz y la humedad. Deseche todo el medicamento que no haya utilizado, despus de la fecha de vencimiento. ATENCIN: Este folleto es un resumen. Puede ser que no cubra toda la posible  informacin. Si usted tiene preguntas acerca de esta medicina, consulte con su mdico, su farmacutico o su profesional de Technical sales engineer.  2018 Elsevier/Gold Standard (2014-06-20 00:00:00)

## 2017-02-13 ENCOUNTER — Ambulatory Visit: Payer: Self-pay | Attending: Physician Assistant

## 2017-03-04 ENCOUNTER — Telehealth (INDEPENDENT_AMBULATORY_CARE_PROVIDER_SITE_OTHER): Payer: Self-pay | Admitting: Physician Assistant

## 2017-03-04 NOTE — Telephone Encounter (Signed)
I have explained her treatment to her many times. I would advise that she go to the infertility specialist and she should take her medical records with her. She will likely need laboratory analysis also of all her hormones but of course that is an extra fee.

## 2017-03-04 NOTE — Telephone Encounter (Signed)
Patient's is comcern because the previous doctor prescribe clomid and Provera and PA [place] [facility]iler only prescribe Clomid and she thinks that is why she can't get pregnant because she is no taking both and she is trying to go to an infertility clinic the cost is $250 consultation and she wants to know if is another option before she goes to the specialist .  Thank You  Please, call her

## 2017-03-04 NOTE — Telephone Encounter (Signed)
FWD to PCP. Kari Ortega S Jary Louvier, CMA  

## 2017-03-05 ENCOUNTER — Encounter: Payer: Self-pay | Admitting: Pediatric Intensive Care

## 2017-03-06 NOTE — Telephone Encounter (Signed)
Patient is aware of that

## 2017-03-20 NOTE — Congregational Nurse Program (Signed)
Congregational Nurse Program Note  Date of Encounter: 03/05/2017  Past Medical History: Past Medical History:  Diagnosis Date  . Anemia   . Irregular periods     Encounter Details: CNP Questionnaire - 03/05/17 1545      Questionnaire   Patient Status  Immigrant    Race  Hispanic or [place]  Not Applicable    Uninsured  Uninsured (NEW 1x/quarter)    Housing/Utilities  Yes, have permanent housing    Transportation  No transportation needs    Interpersonal Safety  Yes, feel physically and emotionally safe where you currently live    Medication  No medication insecurities    Medical Provider  Yes    Referrals  [place]    ED Visit Averted  Not Applicable    Life-Saving Intervention Made  Not Applicable      Via interpreter Esmond Plants- Client is a patient at [facility]. She states that she has been seen at clinic and given clomid for fertility treatment. She states that she was able to conceive previously but after provera was added to clomid. She has been referred to a fertility clinic by her PCP. The client is concerned about the cost of the clinic referral. CN advised that fertility treatment can be very expensive and that the burden of cost would be on her and her partner. Client will foolow up with PCP.

## 2017-04-30 ENCOUNTER — Ambulatory Visit: Payer: Self-pay | Admitting: Internal Medicine

## 2017-05-12 NOTE — L&D Delivery Note (Addendum)
OB/GYN Faculty Practice Delivery Note  Kari Ortega is a 37 y.o. [id number] s/p SVD at [redacted]w[redacted]d. She was admitted for SOL.   ROM: 1h 4m with clear fluid GBS Status: positive, s/p 2g ampicillin  Maximum Maternal Temperature: 98.66F  Labor Progress: . AROM at 1120  Delivery Date/Time: 1233 Delivery: Called to room and patient was complete and pushing. Head delivered LOA. No nuchal cord present. Shoulder and body delivered in usual fashion. Infant with spontaneous cry, placed on mother's abdomen, dried and stimulated. Cord clamped x 2 after 1-minute delay, and cut by FOB. Cord blood drawn. Placenta delivered spontaneously with gentle cord traction. Fundus firm with massage and Pitocin. Labia, perineum, vagina, and cervix inspected inspected with small hemostatic R labial abrasion noted, no lacerations.   Placenta: intact Complications: None Lacerations: None EBL: 151ml  Infant: vigorous female  APGARs 9 and 9  weight pending   Orlene Plum, MD Family Medicine Resident    Please schedule this patient for Postpartum visit in: 6 weeks with the following provider: Any provider For C/S patients schedule nurse incision check in weeks 2 weeks: no Low risk pregnancy complicated by: nothing Delivery mode:  SVD Anticipated Birth Control:  other/unsure PP Procedures needed: none  Schedule Integrated [place] visit: no   I attest that I was gowned and gloved for the delivery in its entirety and I agree with the above documentation and findings.   Maye Hides CNM

## 2017-05-18 ENCOUNTER — Ambulatory Visit: Payer: Self-pay | Attending: Physician Assistant

## 2017-05-19 ENCOUNTER — Encounter (INDEPENDENT_AMBULATORY_CARE_PROVIDER_SITE_OTHER): Payer: Self-pay | Admitting: Physician Assistant

## 2017-05-19 ENCOUNTER — Ambulatory Visit (INDEPENDENT_AMBULATORY_CARE_PROVIDER_SITE_OTHER): Payer: Self-pay | Admitting: Physician Assistant

## 2017-05-19 ENCOUNTER — Other Ambulatory Visit: Payer: Self-pay

## 2017-05-19 VITALS — BP 116/80 | HR 78 | Temp 97.7°F | Wt 164.8 lb

## 2017-05-19 DIAGNOSIS — Z789 Other specified health status: Secondary | ICD-10-CM

## 2017-05-19 DIAGNOSIS — Z3141 Encounter for fertility testing: Secondary | ICD-10-CM

## 2017-05-19 DIAGNOSIS — K029 Dental caries, unspecified: Secondary | ICD-10-CM

## 2017-05-19 NOTE — Patient Instructions (Signed)
[email or web address]    Caries dentales (Dental Caries) Caries dentales (enfermedad en los dientes) Esta enfermedad puede originar un hueco en los dientes (carie) que puede volverse ms grande y profunda a lo largo del [place]. CUIDADOS EN EL HOGAR  [place] sus dientes y use hilo dental. Hgalo por lo [employer] al da.  Use dentfrico con flor.  Use enjuague bucal si as se lo indica el dentista o el mdico.  Coma menos alimentos con azcar y almidn. Beba menos bebidas azucaradas.  Evite comer con frecuencia bocadillos con azcar y almidn. Evite beber con frecuencia bebidas azucaradas.  Concurra a los controles y limpiezas regulares con Brewing technologist.  Use suplementos con flor, si as se lo indica el dentista o el mdico.  Permita que le coloquen flor en los dientes si as se lo indica el dentista o el mdico.  Esta informacin no tiene Marine scientist el consejo del mdico. Asegrese de hacerle al mdico cualquier pregunta que tenga. Document Released: 02/16/2013 Document Revised: 05/19/2014 Document Reviewed: 04/30/2012 Elsevier Interactive Patient Education  2017 [employer].

## 2017-05-19 NOTE — Progress Notes (Signed)
Subjective:  Patient ID: Kari Ortega, female    DOB: 02-26-1981  Age: 37 y.o. MRN: [id number]  CC: bump on head. Fertility f/u  HPI     Kari Ortega is a 37 y.o. female with a PMH of anemia, irregular periods, and headache presents with complaint of bump on the head that is linear in nature and felt from the right anterior to the right posterior of cranium. Found incidentally while brushing hair. Does not know how long "bump" may have been there. There is no pain, itching, edema, erythema, ecchymosis, neurological symptoms, or history of trauma/fall.    Patient has been to infertility clinic. She was prescribed Letrozole 2.5 mg, Provera 10 mg, and ovidrel 250 mcg injection.     Would like referral to dentist for dental cleaning/maintenance. No pain, bleeding, suppuration, or swelling.   Outpatient Medications Prior to Visit  Medication Sig Dispense Refill  . cetirizine (ZYRTEC) 10 MG tablet Take 1 tablet (10 mg total) by mouth daily. 30 tablet 2  . clomiPHENE (CLOMID) 50 MG tablet Take 2 tablets (100 mg total) by mouth daily. 10 tablet 0  . cyclobenzaprine (FLEXERIL) 10 MG tablet Take 1 tablet (10 mg total) by mouth at bedtime. 10 tablet 0  . fexofenadine (ALLEGRA) 180 MG tablet Take 1 tablet (180 mg total) by mouth daily.    Marland Kitchen ibuprofen (ADVIL,MOTRIN) 600 MG tablet Take 1 tablet (600 mg total) by mouth every 6 (six) hours as needed for headache or moderate pain. 30 tablet 1  . meloxicam (MOBIC) 15 MG tablet Take 1 tablet (15 mg total) by mouth daily. 30 tablet 0  . mometasone (NASONEX) 50 MCG/ACT nasal spray 2 sprays each nostril once daily 17 g 12  . Multiple Vitamin (MULTIVITAMIN) tablet Take 1 tablet by mouth daily.    . Olopatadine HCl (PATADAY) 0.2 % SOLN 1 drop both eyes once daily 2.5 mL 11   No facility-administered medications prior to visit.      ROS Review of Systems  Constitutional: Negative for chills, fever and malaise/fatigue.  Eyes:  Negative for blurred vision.  Respiratory: Negative for shortness of breath.   Cardiovascular: Negative for chest pain and palpitations.  Gastrointestinal: Negative for abdominal pain and nausea.  Genitourinary: Negative for dysuria and hematuria.  Musculoskeletal: Negative for joint pain and myalgias.  Skin: Negative for rash.  Neurological: Negative for tingling and headaches.  Psychiatric/Behavioral: Negative for depression. The patient is not nervous/anxious.     Objective:  BP 116/80 (BP Location: Left Arm, Patient Position: Sitting, Cuff Size: Normal)   Pulse 78   Temp 97.7 F (36.5 C) (Oral)   Wt 164 lb 12.8 oz (74.8 kg)   LMP 05/15/2017 (Exact Date)   SpO2 97%   BMI 33.67 kg/m   BP/Weight 05/19/2017 12/22/2016 9/79/8921  Systolic BP 194 174 081  Diastolic BP 80 84 72  Wt. (Lbs) 164.8 162.6 159  BMI 33.67 33.22 32.49      Physical Exam  Constitutional: She is oriented to person, place, and time.  Well developed, well nourished, NAD, polite  HENT:  Head: Normocephalic and atraumatic.  Small dental caries on tooth number 3. Normal cranium with small ridge felt on the superior aspect of right parietal region  Eyes: No scleral icterus.  Neck: Normal range of motion. Neck supple.  Pulmonary/Chest: Effort normal.  Musculoskeletal: She exhibits no edema.  Lymphadenopathy:    She has no cervical adenopathy.  Neurological: She is alert and oriented to person,  place, and time.  Skin: Skin is warm and dry. No rash noted. No erythema. No pallor.  Psychiatric: She has a normal mood and affect. Her behavior is normal. Thought content normal.  Vitals reviewed.    Assessment & Plan:    1. Dental caries - I have given patient information to website [email or web address]. I have taught her how to navigate the internet so she may get to [email or web address]. In addition, I showed her how to navigate the dentist search on [email or web address].   2. Normal skin appearance - no work up necessary for  scalp and cranium. I have explained there are normal anatomic variations to the cranium from one side to the other.  3. Fertility testing - Patient under the care of infertility specialist. I have advised that she continue her treatment plan.    Follow-up: Return if symptoms worsen or fail to improve.   Clent Demark PA

## 2017-07-07 ENCOUNTER — Ambulatory Visit (INDEPENDENT_AMBULATORY_CARE_PROVIDER_SITE_OTHER): Payer: Self-pay | Admitting: Physician Assistant

## 2017-07-07 ENCOUNTER — Encounter (INDEPENDENT_AMBULATORY_CARE_PROVIDER_SITE_OTHER): Payer: Self-pay | Admitting: Physician Assistant

## 2017-07-07 VITALS — BP 106/74 | HR 88 | Temp 98.6°F | Resp 18 | Ht <= 58 in | Wt 164.0 lb

## 2017-07-07 DIAGNOSIS — R102 Pelvic and perineal pain: Secondary | ICD-10-CM

## 2017-07-07 LAB — POCT URINALYSIS DIPSTICK
Bilirubin, UA: NEGATIVE
Blood, UA: NEGATIVE
Glucose, UA: NEGATIVE
Leukocytes, UA: NEGATIVE
NITRITE UA: NEGATIVE
PROTEIN UA: NEGATIVE
UROBILINOGEN UA: 1 U/dL
pH, UA: 6.5 (ref 5.0–8.0)

## 2017-07-07 LAB — POCT URINE PREGNANCY: Preg Test, Ur: NEGATIVE

## 2017-07-07 MED ORDER — NAPROXEN 500 MG PO TABS: 500.0000 mg | ORAL_TABLET | Freq: Two times a day (BID) | ORAL | 0 refills | Status: DC

## 2017-07-07 NOTE — Progress Notes (Signed)
Subjective:  Patient ID: Kari Ortega, female    DOB: 1980/06/18  Age: 37 y.o. MRN: [id number]  CC:   HPI Kari Ortega a 37 y.o.femalewith a PMH of anemia, irregular periods, and headache presents with a 37 day history of left sided pelvic pain that radiates inferiorly down the left lateral thigh. Feels as though her menstrual cycle will begin. Has been receiving infertility treatments at an outside clinic. Last pelvic US revealed right sided cyst but nothing on the left. Does not endorse any other symptoms or complaints.     Outpatient Medications Prior to Visit  Medication Sig Dispense Refill  . cetirizine (ZYRTEC) 10 MG tablet Take 1 tablet (10 mg total) by mouth daily. 30 tablet 2  . clomiPHENE (CLOMID) 50 MG tablet Take 2 tablets (100 mg total) by mouth daily. 10 tablet 0  . cyclobenzaprine (FLEXERIL) 10 MG tablet Take 1 tablet (10 mg total) by mouth at bedtime. 10 tablet 0  . fexofenadine (ALLEGRA) 180 MG tablet Take 1 tablet (180 mg total) by mouth daily.    Marland Kitchen ibuprofen (ADVIL,MOTRIN) 600 MG tablet Take 1 tablet (600 mg total) by mouth every 6 (six) hours as needed for headache or moderate pain. 30 tablet 1  . meloxicam (MOBIC) 15 MG tablet Take 1 tablet (15 mg total) by mouth daily. 30 tablet 0  . mometasone (NASONEX) 50 MCG/ACT nasal spray 2 sprays each nostril once daily 17 g 12  . Multiple Vitamin (MULTIVITAMIN) tablet Take 1 tablet by mouth daily.    . Olopatadine HCl (PATADAY) 0.2 % SOLN 1 drop both eyes once daily 2.5 mL 11   No facility-administered medications prior to visit.      ROS Review of Systems  Constitutional: Negative for chills, fever and malaise/fatigue.  Eyes: Negative for blurred vision.  Respiratory: Negative for shortness of breath.   Cardiovascular: Negative for chest pain and palpitations.  Gastrointestinal: Negative for abdominal pain and nausea.  Genitourinary: Negative for dysuria and hematuria.   Musculoskeletal: Negative for joint pain and myalgias.       Pelvic pain  Skin: Negative for rash.  Neurological: Negative for tingling and headaches.  Psychiatric/Behavioral: Negative for depression. The patient is not nervous/anxious.     Objective:  BP 106/74 (BP Location: Left Arm, Patient Position: Sitting, Cuff Size: Large)   Pulse 88   Temp 98.6 F (37 C) (Oral)   Resp 18   Ht 4' 9.87" (1.47 m)   Wt 164 lb (74.4 kg)   LMP 06/25/2017   SpO2 98%   BMI 34.43 kg/m   BP/Weight 07/07/2017 05/19/2017 5/80/9983  Systolic BP 382 505 397  Diastolic BP 74 80 84  Wt. (Lbs) 164 164.8 162.6  BMI 34.43 33.67 33.22      Physical Exam  Constitutional: She is oriented to person, place, and time.  Well developed, well nourished, NAD, polite  HENT:  Head: Normocephalic and atraumatic.  Eyes: No scleral icterus.  Neck: Normal range of motion. Neck supple. No thyromegaly present.  Cardiovascular: Normal rate, regular rhythm and normal heart sounds.  Pulmonary/Chest: Effort normal and breath sounds normal.  Abdominal: Soft. Bowel sounds are normal. There is no tenderness.  Genitourinary:  Genitourinary Comments: Left sided adnexal TTP.  Musculoskeletal: She exhibits no edema.  Neurological: She is alert and oriented to person, place, and time.  Skin: Skin is warm and dry. No rash noted. No erythema. No pallor.  Psychiatric: She has a normal mood and affect. Her behavior  is normal. Thought content normal.  Vitals reviewed.    Assessment & Plan:   1. Pelvic pain in female - Urinalysis Dipstick negative in clinic today. - POCT urine pregnancy negative in clinic today. - naproxen (NAPROSYN) 500 MG tablet; Take 1 tablet (500 mg total) by mouth 2 (two) times daily with a meal.  Dispense: 30 tablet; Refill: 0   Meds ordered this encounter  Medications  . naproxen (NAPROSYN) 500 MG tablet    Sig: Take 1 tablet (500 mg total) by mouth 2 (two) times daily with a meal.    Dispense:   30 tablet    Refill:  0    Order Specific Question:   Supervising Provider    Answer:   Tresa Garter [id number]    Follow-up: Return if symptoms worsen or fail to improve.   Clent Demark PA

## 2017-07-07 NOTE — Patient Instructions (Signed)
Dolor plvico en la mujer (Pelvic Pain, Female) El dolor plvico se percibe en la parte baja del abdomen, por debajo del ombligo y entre las caderas. El dolor puede comenzar de forma repentina (agudo), reaparecer (recurrente) o durar mucho tiempo (crnico). Se considera que el dolor plvico que dura ms de seis meses es crnico. El dolor plvico puede tener numerosas causas. A veces, la causa no se conoce. CUIDADOS EN EL HOGAR  Tome los medicamentos de venta libre y los recetados solamente como se lo haya indicado el mdico.  Haga reposo como se lo haya indicado el mdico.  No tenga relaciones sexuales si le causan dolor.  Lleve un registro del dolor plvico. Escriba los siguientes datos: ? Cundo comenz el dolor. ? La ubicacin del dolor. ? Qu cosas parecen aliviar o intensificar el dolor, como los alimentos o la menstruacin. ? Los sntomas que tiene junto con el dolor.  Concurra a todas las visitas de control como se lo haya indicado el mdico. Esto es importante. SOLICITE AYUDA SI:  Los medicamentos no le alivian el dolor.  El dolor reaparece.  Aparecen nuevos sntomas.  Tiene secrecin o sangrado vaginal atpico.  Tiene fiebre o siente escalofros.  Tiene dificultad para defecar (estreimiento).  Observa sangre en la orina o en la materia fecal.  La orina tiene mal olor.  Se siente mareada o dbil. SOLICITE AYUDA DE INMEDIATO SI:  Siente un dolor repentino que es muy intenso.  El dolor contina empeorando.  El dolor es muy intenso y, adems, tiene alguno de los siguientes sntomas: ? Fiebre. ? Ganas de vomitar (nuseas). ? Vmitos. ? Suda mucho.  Se desmaya (pierde el conocimiento). Esta informacin no tiene como fin reemplazar el consejo del mdico. Asegrese de hacerle al mdico cualquier pregunta que tenga. Document Released: 10/28/2011 Document Revised: 05/19/2014 Document Reviewed: 02/16/2015 Elsevier Interactive Patient Education  2018 [employer]  Inc[employer]  

## 2017-07-09 ENCOUNTER — Encounter (HOSPITAL_COMMUNITY): Payer: Self-pay

## 2017-07-09 ENCOUNTER — Other Ambulatory Visit: Payer: Self-pay

## 2017-07-09 ENCOUNTER — Inpatient Hospital Stay (HOSPITAL_COMMUNITY)
Admission: AD | Admit: 2017-07-09 | Discharge: 2017-07-10 | Disposition: A | Discharge status: Home / Self Care | Payer: Self-pay | Source: Ambulatory Visit | Attending: Obstetrics and Gynecology | Admitting: Obstetrics and Gynecology

## 2017-07-09 ENCOUNTER — Inpatient Hospital Stay (HOSPITAL_COMMUNITY): Payer: Self-pay

## 2017-07-09 DIAGNOSIS — R102 Pelvic and perineal pain: Secondary | ICD-10-CM | POA: Insufficient documentation

## 2017-07-09 DIAGNOSIS — Z3A01 Less than 8 weeks gestation of pregnancy: Secondary | ICD-10-CM | POA: Insufficient documentation

## 2017-07-09 DIAGNOSIS — O3680X0 Pregnancy with inconclusive fetal viability, not applicable or unspecified: Secondary | ICD-10-CM

## 2017-07-09 DIAGNOSIS — O26899 Other specified pregnancy related conditions, unspecified trimester: Secondary | ICD-10-CM

## 2017-07-09 DIAGNOSIS — O26891 Other specified pregnancy related conditions, first trimester: Secondary | ICD-10-CM | POA: Insufficient documentation

## 2017-07-09 DIAGNOSIS — Z8759 Personal history of other complications of pregnancy, childbirth and the puerperium: Secondary | ICD-10-CM

## 2017-07-09 LAB — URINALYSIS, ROUTINE W REFLEX MICROSCOPIC
BILIRUBIN URINE: NEGATIVE
GLUCOSE, UA: NEGATIVE mg/dL
HGB URINE DIPSTICK: NEGATIVE
Ketones, ur: NEGATIVE mg/dL
NITRITE: NEGATIVE
PROTEIN: NEGATIVE mg/dL
Specific Gravity, Urine: 1.01 (ref 1.005–1.030)
Trans Epithel, UA: <1
pH: 6 (ref 5.0–8.0)

## 2017-07-09 LAB — WET PREP, GENITAL
CLUE CELLS WET PREP: NONE SEEN
Sperm: NONE SEEN
TRICH WET PREP: NONE SEEN
YEAST WET PREP: NONE SEEN

## 2017-07-09 LAB — CBC
HCT: 34.6 % — ABNORMAL LOW (ref 36.0–46.0)
Hemoglobin: 11.6 g/dL — ABNORMAL LOW (ref 12.0–15.0)
MCH: 27.3 pg (ref 26.0–34.0)
MCHC: 33.5 g/dL (ref 30.0–36.0)
MCV: 81.4 fL (ref 78.0–100.0)
Platelets: 313 10*3/uL (ref 150–400)
RBC: 4.25 MIL/uL (ref 3.87–5.11)
RDW: 14.6 % (ref 11.5–15.5)
WBC: 11.4 10*3/uL — AB (ref 4.0–10.5)

## 2017-07-09 LAB — HCG, QUANTITATIVE, PREGNANCY: HCG, BETA CHAIN, QUANT, S: 95 m[IU]/mL — AB (ref <5)

## 2017-07-09 LAB — POCT PREGNANCY, URINE: PREG TEST UR: POSITIVE — AB

## 2017-07-09 NOTE — MAU Provider Note (Signed)
Chief Complaint: No chief complaint on file.   First Provider Initiated Contact with Patient 07/09/17 2239        SUBJECTIVE HPI: Kari Ortega is a 37 y.o. [id number] at Unknown by LMP who presents to maternity admissions reporting pelvic pain  No bleeding. She denies vaginal bleeding, vaginal itching/burning, urinary symptoms, h/a, dizziness, n/v, or fever/chills.    She reports being followed and treated by Dr Kerin Perna for infertility  Takes Femara and has used Fertility meds for all prior pregnancies.  States tried calling their office but no one answered.   Abdominal Pain  This is a new problem. The current episode started today. The onset quality is gradual. The problem occurs intermittently. The problem has been unchanged. The pain is located in the suprapubic region. The quality of the pain is cramping and aching. The abdominal pain does not radiate. Associated symptoms include dysuria. Pertinent negatives include no constipation, diarrhea, fever, frequency, headaches, myalgias, nausea or vomiting. Nothing aggravates the pain. The pain is relieved by nothing. She has tried nothing for the symptoms.    RN Note: Pt states that she went to the Dr. This week because she has been having pain, the doctor told her that one of her ovaries was swelling. The pt states she thinks she is pregnant. She is also having white discharge    Past Medical History:  Diagnosis Date  . Anemia   . Irregular periods    History reviewed. No pertinent surgical history. Social History   Socioeconomic History  . Marital status: Married    Spouse name: Not on file  . Number of children: Not on file  . Years of education: Not on file  . Highest education level: Not on file  Social Needs  . Financial resource strain: Not on file  . Food insecurity - worry: Not on file  . Food insecurity - inability: Not on file  . Transportation needs - medical: Not on file  . Transportation needs -  non-medical: Not on file  Occupational History  . Not on file  Tobacco Use  . Smoking status: Never Smoker  . Smokeless tobacco: Never Used  Substance and Sexual Activity  . Alcohol use: No  . Drug use: No  . Sexual activity: Yes  Other Topics Concern  . Not on file  Social History Narrative  . Not on file   No current facility-administered medications on file prior to encounter.    Current Outpatient Medications on File Prior to Encounter  Medication Sig Dispense Refill  . cetirizine (ZYRTEC) 10 MG tablet Take 1 tablet (10 mg total) by mouth daily. 30 tablet 2  . clomiPHENE (CLOMID) 50 MG tablet Take 2 tablets (100 mg total) by mouth daily. 10 tablet 0  . cyclobenzaprine (FLEXERIL) 10 MG tablet Take 1 tablet (10 mg total) by mouth at bedtime. 10 tablet 0  . fexofenadine (ALLEGRA) 180 MG tablet Take 1 tablet (180 mg total) by mouth daily.    Marland Kitchen ibuprofen (ADVIL,MOTRIN) 600 MG tablet Take 1 tablet (600 mg total) by mouth every 6 (six) hours as needed for headache or moderate pain. 30 tablet 1  . meloxicam (MOBIC) 15 MG tablet Take 1 tablet (15 mg total) by mouth daily. 30 tablet 0  . mometasone (NASONEX) 50 MCG/ACT nasal spray 2 sprays each nostril once daily 17 g 12  . Multiple Vitamin (MULTIVITAMIN) tablet Take 1 tablet by mouth daily.    . naproxen (NAPROSYN) 500 MG tablet Take 1 tablet (  500 mg total) by mouth 2 (two) times daily with a meal. 30 tablet 0  . Olopatadine HCl (PATADAY) 0.2 % SOLN 1 drop both eyes once daily 2.5 mL 11   No Known Allergies  I have reviewed patient's Past Medical Hx, Surgical Hx, Family Hx, Social Hx, medications and allergies.   ROS:  Review of Systems  Constitutional: Negative for fever.  Gastrointestinal: Positive for abdominal pain. Negative for constipation, diarrhea, nausea and vomiting.  Genitourinary: Positive for dysuria. Negative for frequency.  Musculoskeletal: Negative for myalgias.  Neurological: Negative for headaches.   Review of  Systems  Other systems negative   Physical Exam  Physical Exam Patient Vitals for the past 24 hrs:  BP Temp Temp src Pulse Resp SpO2  07/09/17 2217 115/67 98.1 F (36.7 C) Oral 82 16 100 %   Constitutional: Well-developed, well-nourished female in no acute distress.  Cardiovascular: normal rate Respiratory: normal effort GI: Abd soft, non-tender. Pos BS x 4 MS: Extremities nontender, no edema, normal ROM Neurologic: Alert and oriented x 4.  GU: Neg CVAT.  PELVIC EXAM: Cervix pink, visually closed, without lesion, scant white creamy discharge, vaginal walls and external genitalia normal Bimanual exam: Cervix 0/long/high, firm, anterior, neg CMT, uterus nontender, nonenlarged, adnexa without tenderness, enlargement, or mass   LAB RESULTS Results for orders placed or performed during the hospital encounter of 07/09/17 (from the past 24 hour(s))  Urinalysis, Routine w reflex microscopic     Status: Abnormal   Collection Time: 07/09/17 10:00 PM  Result Value Ref Range   Color, Urine YELLOW YELLOW   APPearance HAZY (A) CLEAR   Specific Gravity, Urine 1.010 1.005 - 1.030   pH 6.0 5.0 - 8.0   Glucose, UA NEGATIVE NEGATIVE mg/dL   Hgb urine dipstick NEGATIVE NEGATIVE   Bilirubin Urine NEGATIVE NEGATIVE   Ketones, ur NEGATIVE NEGATIVE mg/dL   Protein, ur NEGATIVE NEGATIVE mg/dL   Nitrite NEGATIVE NEGATIVE   Leukocytes, UA TRACE (A) NEGATIVE   RBC / HPF 0-5 0 - 5 RBC/hpf   WBC, UA 0-5 0 - 5 WBC/hpf   Bacteria, UA RARE (A) NONE SEEN   Squamous Epithelial / LPF 6-30 (A) NONE SEEN   Trans Epithel, UA <1    Mucus PRESENT   Pregnancy, urine POC     Status: Abnormal   Collection Time: 07/09/17 10:27 PM  Result Value Ref Range   Preg Test, Ur POSITIVE (A) NEGATIVE  hCG, quantitative, pregnancy     Status: Abnormal   Collection Time: 07/09/17 10:37 PM  Result Value Ref Range   hCG, Beta Chain, Quant, S 95 (H) <5 mIU/mL  CBC     Status: Abnormal   Collection Time: 07/09/17 10:37 PM   Result Value Ref Range   WBC 11.4 (H) 4.0 - 10.5 K/uL   RBC 4.25 3.87 - 5.11 MIL/uL   Hemoglobin 11.6 (L) 12.0 - 15.0 g/dL   HCT 34.6 (L) 36.0 - 46.0 %   MCV 81.4 78.0 - 100.0 fL   MCH 27.3 26.0 - 34.0 pg   MCHC 33.5 30.0 - 36.0 g/dL   RDW 14.6 11.5 - 15.5 %   Platelets 313 150 - 400 K/uL  Wet prep, genital     Status: Abnormal   Collection Time: 07/09/17 10:43 PM  Result Value Ref Range   Yeast Wet Prep HPF POC NONE SEEN NONE SEEN   Trich, Wet Prep NONE SEEN NONE SEEN   Clue Cells Wet Prep HPF POC NONE SEEN NONE  SEEN   WBC, Wet Prep HPF POC FEW (A) NONE SEEN   Sperm NONE SEEN        IMAGING US Ob Comp Less 14 Wks  Result Date: 07/09/2017 CLINICAL DATA:  Pelvic pain EXAM: OBSTETRIC <14 WK [place] AND TRANSVAGINAL OB US TECHNIQUE: Both transabdominal and transvaginal ultrasound examinations were performed for complete evaluation of the gestation as well as the maternal uterus, adnexal regions, and pelvic cul-de-sac. Transvaginal technique was performed to assess early pregnancy. COMPARISON:  None. FINDINGS: Intrauterine gestational sac: None Yolk sac:  Not visualized Embryo:  Not visualized Cardiac Activity: Heart Rate:   bpm MSD:   mm    w     d CRL:    mm    w    d                  [place] EDC: Subchorionic hemorrhage:  N/A Maternal uterus/adnexae: No adnexal mass. Trace free fluid in the pelvis. IMPRESSION: No intrauterine pregnancy visualized. Differential considerations would include early intrauterine pregnancy too early to visualize, spontaneous abortion, or occult ectopic pregnancy. Recommend close clinical followup and serial quantitative beta HCGs and ultrasounds. Electronically Signed   By: Rolm Baptise M.D.   On: 07/09/2017 23:34   US Ob Transvaginal  Result Date: 07/09/2017 CLINICAL DATA:  Pelvic pain EXAM: OBSTETRIC <14 WK [place] AND TRANSVAGINAL OB US TECHNIQUE: Both transabdominal and transvaginal ultrasound examinations were performed for complete evaluation of the gestation as well  as the maternal uterus, adnexal regions, and pelvic cul-de-sac. Transvaginal technique was performed to assess early pregnancy. COMPARISON:  None. FINDINGS: Intrauterine gestational sac: None Yolk sac:  Not visualized Embryo:  Not visualized Cardiac Activity: Heart Rate:   bpm MSD:   mm    w     d CRL:    mm    w    d                  [place] EDC: Subchorionic hemorrhage:  N/A Maternal uterus/adnexae: No adnexal mass. Trace free fluid in the pelvis. IMPRESSION: No intrauterine pregnancy visualized. Differential considerations would include early intrauterine pregnancy too early to visualize, spontaneous abortion, or occult ectopic pregnancy. Recommend close clinical followup and serial quantitative beta HCGs and ultrasounds. Electronically Signed   By: Rolm Baptise M.D.   On: 07/09/2017 23:34     MAU Management/MDM: Ordered usual first trimester r/o ectopic labs.   Pelvic exam and cultures done Will check baseline Ultrasound to rule out ectopic.  This bleeding/pain can represent a normal pregnancy with bleeding, spontaneous abortion or even an ectopic which can be life-threatening.  The process as listed above helps to determine which of these is present.  Reviewed results with patient HCG is too low to expect to see pregnancy yet Cannot rule out ectopic yet, however Will need to repeat HCG on Saturday here Discussed importance of going to or calling Fertility office tomorrow to let them know she is pregnant Ectopic precautions  ASSESSMENT 1. Pelvic pain affecting pregnancy   2. Pelvic pain affecting pregnancy   3.    Pregnancy at [redacted]w[redacted]d 4.     Pregnancy of unknown location  PLAN Discharge home Plan to repeat HCG level in 48 hours  Will repeat  Ultrasound in about 7-10 days if HCG levels double appropriately  Ectopic precautions   Pt stable at time of discharge. Encouraged to return here or to other Urgent Care/ED if she develops worsening of symptoms, increase in pain,  fever, or other  concerning symptoms.    Hansel Feinstein CNM, MSN Certified Nurse-Midwife 07/09/2017  10:41 PM

## 2017-07-09 NOTE — MAU Note (Signed)
Pt states that she went to the Dr. This week because she has been having pain, the doctor told her that one of her ovaries was swelling. The pt states she thinks she is pregnant. She is also having white discharge

## 2017-07-10 ENCOUNTER — Encounter (HOSPITAL_COMMUNITY): Payer: Self-pay

## 2017-07-10 DIAGNOSIS — O26891 Other specified pregnancy related conditions, first trimester: Secondary | ICD-10-CM

## 2017-07-10 DIAGNOSIS — R102 Pelvic and perineal pain: Secondary | ICD-10-CM

## 2017-07-10 LAB — HIV ANTIBODY (ROUTINE TESTING W REFLEX): HIV Screen 4th Generation wRfx: NONREACTIVE

## 2017-07-10 LAB — GC/CHLAMYDIA PROBE AMP (~~LOC~~) NOT AT ARMC
CHLAMYDIA, DNA PROBE: NEGATIVE
NEISSERIA GONORRHEA: NEGATIVE

## 2017-07-10 NOTE — Discharge Instructions (Signed)
Primer trimestre de embarazo °(First Trimester of Pregnancy) °El primer trimestre de embarazo se extiende desde la semana 1 hasta el final de la semana 12 (mes 1 al mes 3). Durante este tiempo, el bebé comenzará a desarrollarse dentro suyo. Entre la semana 6 y la 8, se forman los ojos y el rostro, y los latidos del corazón pueden verse en la ecografía. Al final de las 12 semanas, todos los órganos del bebé están formados. La atención prenatal es toda la asistencia médica que usted recibe antes del nacimiento del bebé. Asegúrese de recibir una buena atención prenatal y de seguir todas las indicaciones del médico. °CUIDADOS EN EL HOGAR °Medicamentos: °· Tome los medicamentos solamente como se lo haya indicado el médico. Algunos medicamentos se pueden tomar durante el embarazo y otros no. °· Tome las vitaminas prenatales como se lo haya indicado el médico. °· Tome el medicamento que la ayuda a defecar (laxante suave) según sea necesario, si el médico lo autoriza. °Dieta °· Ingiera alimentos saludables de manera regular. °· El médico le indicará la cantidad de peso que puede aumentar. °· No coma carne cruda ni quesos sin cocinar. °· Si tiene malestar estomacal (náuseas) o vomita: °? Ingiera 4 o 5 comidas pequeñas por día en lugar de 3 abundantes. °? Intente comer algunas galletitas saladas. °? Beba líquidos entre las comidas, en lugar de hacerlo durante estas. °· Si tiene dificultad para defecar (estreñimiento): °? Consuma alimentos con alto contenido de fibra, como verduras y frutas frescos, y cereales integrales. °? Beba suficiente líquido para mantener el pis (orina) claro o de color amarillo pálido. °Actividad y ejercicios °· Haga ejercicios solamente como se lo haya indicado el médico. Deje de hacer ejercicios si tiene cólicos o dolor en la parte baja del vientre (abdomen) o en la cintura. °· Intente no estar de pie durante mucho tiempo. Mueva las piernas con frecuencia si debe estar de pie en un lugar durante  mucho tiempo. °· Evite levantar pesos excesivos. °· Use zapatos con tacones bajos. Mantenga una buena postura al sentarse y pararse. °· Puede tener relaciones sexuales, a menos que el médico le indique lo contrario. °Alivio del dolor o las molestias °· Use un sostén que le brinde buen soporte si le duelen las mamas. °· Dese baños con agua tibia (baños de asiento) para aliviar el dolor o las molestias a causa de las hemorroides. Use crema antihemorroidal si el médico se lo permite. °· Descanse con las piernas elevadas si tiene calambres o dolor de cintura. °· Use medias de descanso si tiene las venas de las piernas hinchadas y abultadas (venas varicosas). Eleve los pies durante 15 minutos, 3 o 4 veces por día. Limite la cantidad de sal en su dieta. °Cuidados prenatales °· Programe las visitas prenatales para la semana 12 de embarazo. °· Escriba sus preguntas. Llévelas cuando concurra a las visitas prenatales. °· Concurra a todas las visitas prenatales como se lo haya indicado el médico. °Seguridad °· Colóquese el cinturón de seguridad cuando conduzca. °· Haga una lista con los números de teléfono en caso de emergencia, en la cual deben incluirse los números de los familiares, los amigos, el hospital y los departamentos de policía y de bomberos. °Consejos generales °· Pídale al médico que la derive a clases prenatales en su localidad. Debe comenzar a tomar las clases antes de entrar en el mes 6 de embarazo. °· Pida ayuda si necesita asesoramiento o asistencia con la alimentación. El médico puede aconsejarla o indicarle dónde recurrir para recibir ayuda. °· No se dé baños de inmersión en agua caliente, baños   turcos ni saunas.  No se haga duchas vaginales ni use tampones o toallas higinicas perfumadas.  No mantenga las piernas cruzadas durante mucho tiempo.  Evite el contacto con las bandejas sanitarias de los gatos y la tierra que estos animales usan.  No fume, no consuma hierbas ni beba alcohol. No tome  frmacos que el mdico no haya autorizado.  No consuma ningn producto que contenga tabaco, lo que incluye cigarrillos, tabaco de Higher education careers adviser o Psychologist, sport and exercise. Si necesita ayuda para dejar de fumar, consulte al [employer]. Puede recibir asesoramiento u otro tipo de apoyo para dejar de fumar.  Visite al dentista. En su casa, lvese los dientes con un cepillo dental suave. Psese el hilo dental con suavidad. SOLICITE AYUDA SI:  Tiene mareos.  Tiene clicos leves o siente presin en la parte baja del vientre.  Siente un dolor persistente en la zona del vientre.  Sigue teniendo [employer], vomita o las heces son lquidas (diarrea).  Observa una secrecin, con mal olor que proviene de la vagina.  Siente dolor al [employer].  Tiene el rostro, las [place], las piernas o los tobillos ms hinchados (inflamados).  SOLICITE AYUDA DE INMEDIATO SI:  Tiene fiebre.  Tiene una prdida de lquido por la vagina.  Tiene sangrado o pequeas prdidas vaginales.  Tiene clicos o dolor muy intensos en el vientre.  Sube o baja de peso rpidamente.  Vomita sangre. Puede ser similar a la borra del caf  Est en contacto con personas que tienen rubola, la quinta enfermedad o varicela.  Siente un dolor de cabeza muy intenso.  Le falta el aire.  Sufre cualquier tipo de traumatismo, por ejemplo, debido a una cada o un accidente automovilstico.  Esta informacin no tiene Marine scientist el consejo del mdico. Asegrese de hacerle al mdico cualquier pregunta que tenga. Document Released: 07/25/2008 Document Revised: 05/19/2014 Document Reviewed: 03/08/2013 Elsevier Interactive Patient Education  2017 [place] abdominal durante el embarazo (Abdominal Pain During Pregnancy) El dolor de vientre (abdominal) es habitual durante el embarazo. Generalmente no se trata de un problema grave. Otras veces puede ser un signo de que algo no anda bien. Siempre comunquese con su mdico si  tiene dolor abdominal. CUIDADOS EN EL HOGAR Controle el dolor para ver si hay cambios. Las indicaciones que siguen pueden ayudarla a sentirse mejor:  Optician, dispensing (relaciones sexuales) ni se coloque nada dentro de la vagina hasta que se sienta mejor.  Haga reposo hasta que el dolor se calme.  Si siente ganas de vomitar (nuseas ) beba lquidos claros. No consuma alimentos slidos hasta que se sienta mejor.  Slo tome los medicamentos que le haya indicado su mdico.  Cumpla con las visitas al mdico segn las indicaciones. SOLICITE AYUDA DE INMEDIATO SI:  Tiene un sangrado, pierde lquido o elimina trozos de tejido por la vagina.  Siente ms dolor o clicos.  Comienza a vomitar.  Siente dolor al orinar u observa sangre en la orina.  Tiene fiebre.  No siente que el beb se mueva mucho.  Se siente muy dbil o cree que va a desmayarse.  Tiene dificultad para respirar con o sin dolor en el vientre.  Siente un dolor de cabeza muy intenso y Social research officer, government en el vientre.  Observa que sale un lquido por la vagina y tiene dolor abdominal.  La materia fecal es lquida (diarrea).  El dolor en el viente no desaparece, o empeora, luego de hacer reposo. ASEGRESE DE QUE:  Comprende estas instrucciones.  Controlar su afeccin.  Recibir ayuda de inmediato si no mejora o si empeora. Esta informacin no tiene Marine scientist el consejo del mdico. Asegrese de hacerle al mdico cualquier pregunta que tenga. Document Released: 01/08/2011 Document Revised: 08/20/2015 Document Reviewed: 11/25/2012 Elsevier Interactive Patient Education  2018 [place] of Pregnancy The first trimester of pregnancy is from week 1 until the end of week 13 (months 1 through 3). During this time, your baby will begin to develop inside you. At 6-8 weeks, the eyes and face are formed, and the heartbeat can be seen on ultrasound. At the end of 12 weeks, all the baby's organs are formed. Prenatal  care is all the medical care you receive before the birth of your baby. Make sure you get good prenatal care and follow all of your doctor's instructions. Follow these instructions at home: Medicines  Take over-the-counter and prescription medicines only as told by your doctor. Some medicines are safe and some medicines are not safe during pregnancy.  Take a prenatal vitamin that contains at least 600 micrograms (mcg) of folic acid.  If you have trouble pooping (constipation), take medicine that will make your stool soft (stool softener) if your doctor approves. Eating and drinking  Eat regular, healthy meals.  Your doctor will tell you the amount of weight gain that is right for you.  Avoid raw meat and uncooked cheese.  If you feel sick to your stomach (nauseous) or throw up (vomit): ? Eat 4 or 5 small meals a day instead of 3 large meals. ? Try eating a few soda crackers. ? Drink liquids between meals instead of during meals.  To prevent constipation: ? Eat foods that are high in fiber, like fresh fruits and vegetables, whole grains, and beans. ? Drink enough fluids to keep your pee (urine) clear or pale yellow. Activity  Exercise only as told by your doctor. Stop exercising if you have cramps or pain in your lower belly (abdomen) or low back.  Do not exercise if it is too hot, too humid, or if you are in a place of great height (high altitude).  Try to avoid standing for long periods of time. Move your legs often if you must stand in one place for a long time.  Avoid heavy lifting.  Wear low-heeled shoes. Sit and stand up straight.  You can have sex unless your doctor tells you not to. Relieving pain and discomfort  Wear a good support bra if your breasts are sore.  Take warm water baths (sitz baths) to soothe pain or discomfort caused by hemorrhoids. Use hemorrhoid cream if your doctor says it is okay.  Rest with your legs raised if you have leg cramps or low back  pain.  If you have puffy, bulging veins (varicose veins) in your legs: ? Wear support hose or compression stockings as told by your doctor. ? Raise (elevate) your feet for 15 minutes, 3-4 times a day. ? Limit salt in your food. Prenatal care  Schedule your prenatal visits by the twelfth week of pregnancy.  Write down your questions. Take them to your prenatal visits.  Keep all your prenatal visits as told by your doctor. This is important. Safety  Wear your seat belt at all times when driving.  Make a list of emergency phone numbers. The list should include numbers for family, friends, the hospital, and police and fire departments. General instructions  Ask your doctor for a referral to a local prenatal class. Begin classes no later  than at the start of month 6 of your pregnancy.  Ask for help if you need counseling or if you need help with nutrition. Your doctor can give you advice or tell you where to go for help.  Do not use hot tubs, steam rooms, or saunas.  Do not douche or use tampons or scented sanitary pads.  Do not cross your legs for long periods of time.  Avoid all herbs and alcohol. Avoid drugs that are not approved by your doctor.  Do not use any tobacco products, including cigarettes, chewing tobacco, and electronic cigarettes. If you need help quitting, ask your doctor. You may get counseling or other support to help you quit.  Avoid cat litter boxes and soil used by cats. These carry germs that can cause birth defects in the baby and can cause a loss of your baby (miscarriage) or stillbirth.  Visit your dentist. At home, brush your teeth with a soft toothbrush. Be gentle when you floss. Contact a doctor if:  You are dizzy.  You have mild cramps or pressure in your lower belly.  You have a nagging pain in your belly area.  You continue to feel sick to your stomach, you throw up, or you have watery poop (diarrhea).  You have a bad smelling fluid coming  from your vagina.  You have pain when you pee (urinate).  You have increased puffiness (swelling) in your face, hands, legs, or ankles. Get help right away if:  You have a fever.  You are leaking fluid from your vagina.  You have spotting or bleeding from your vagina.  You have very bad belly cramping or pain.  You gain or lose weight rapidly.  You throw up blood. It may look like coffee grounds.  You are around people who have [place] measles, fifth disease, or chickenpox.  You have a very bad headache.  You have shortness of breath.  You have any kind of trauma, such as from a fall or a car accident. Summary  The first trimester of pregnancy is from week 1 until the end of week 13 (months 1 through 3).  To take care of yourself and your unborn baby, you will need to eat healthy meals, take medicines only if your doctor tells you to do so, and do activities that are safe for you and your baby.  Keep all follow-up visits as told by your doctor. This is important as your doctor will have to ensure that your baby is healthy and growing well. This information is not intended to replace advice given to you by your health care provider. Make sure you discuss any questions you have with your health care provider. Document Released: 10/15/2007 Document Revised: 05/06/2016 Document Reviewed: 05/06/2016 Elsevier Interactive Patient Education  2017 [employer].

## 2017-07-11 ENCOUNTER — Inpatient Hospital Stay (HOSPITAL_COMMUNITY)
Admission: AD | Admit: 2017-07-11 | Discharge: 2017-07-11 | Disposition: A | Discharge status: Home / Self Care | Payer: Self-pay | Source: Ambulatory Visit | Attending: Obstetrics and Gynecology | Admitting: Obstetrics and Gynecology

## 2017-07-11 DIAGNOSIS — O26891 Other specified pregnancy related conditions, first trimester: Secondary | ICD-10-CM | POA: Insufficient documentation

## 2017-07-11 DIAGNOSIS — O3680X0 Pregnancy with inconclusive fetal viability, not applicable or unspecified: Secondary | ICD-10-CM

## 2017-07-11 DIAGNOSIS — Z3A01 Less than 8 weeks gestation of pregnancy: Secondary | ICD-10-CM | POA: Insufficient documentation

## 2017-07-11 DIAGNOSIS — Z3490 Encounter for supervision of normal pregnancy, unspecified, unspecified trimester: Secondary | ICD-10-CM

## 2017-07-11 LAB — HCG, QUANTITATIVE, PREGNANCY: HCG, BETA CHAIN, QUANT, S: 240 m[IU]/mL — AB (ref <5)

## 2017-07-11 MED ORDER — PROGESTERONE 400 MG VA SUPP: 400.0000 mg | Freq: Every day | VAGINAL | 4 refills | Status: DC

## 2017-07-11 NOTE — MAU Provider Note (Signed)
Ms. Kari Ortega  is a 37 y.o. [id number] at [redacted]w[redacted]d who presents to MAU today for follow-up quant hCG after 48 hours. The patient was seen in MAU on 2/28 and had quant hCG of 95 and US showed preg of unknown anatomical location. She denies/endorses no pain, vaginal bleeding or fever today.   OB History  Gravida Para Term Preterm AB Living  5 2 2   2 2   SAB TAB Ectopic Multiple Live Births  1   1   2     # Outcome Date GA Lbr Len/2nd Weight Sex Delivery Anes PTL Lv  5 Current           4 Term 2009    M Vag-Spont   LIV  3 SAB 2008          2 Term      Vag-Spont     1 Ectopic               Past Medical History:  Diagnosis Date  . Anemia   . Irregular periods     ROS: no VB no pain  BP 111/68 (BP Location: Right Arm)   Pulse 93   Temp 98.9 F (37.2 C) (Oral)   Resp 16   LMP 06/15/2017   SpO2 100%   CONSTITUTIONAL: Well-developed, well-nourished female in no acute distress.  MUSCULOSKELETAL: Normal range of motion.  CARDIOVASCULAR: Regular heart rate RESPIRATORY: Normal effort NEUROLOGICAL: Alert and oriented to person, place, and time.  SKIN: Not diaphoretic. No erythema. No pallor. PSYCH: Normal mood and affect. Normal behavior. Normal judgment and thought content.  Results for orders placed or performed during the hospital encounter of 07/11/17 (from the past 24 hour(s))  hCG, quantitative, pregnancy     Status: Abnormal   Collection Time: 07/11/17  5:36 PM  Result Value Ref Range   hCG, Beta Chain, Quant, S 240 (H) <5 mIU/mL    MDM: lengthy discussion with pt and pt request care in our high risk clinic. States only two successful pregnancies had to be supplemented with progesterone supp during early pregnancy. Will repeat u/s in 2 wks. Will start progest vag supp at hs. Referred to high risk clinic. Ectopic precautions given.  A: Appropriate rise in quant hCG after 48 hours  P: Discharge home First trimester/ectopic precautions discussed Patient will  return for follow-up US in 1 week. Order placed and RN to schedule. Patient will return to [facility] for results following [place].  Patient may return to MAU as needed or if her condition were to change or worsen   Kari Ortega 07/11/2017 6:52 PM

## 2017-07-11 NOTE — MAU Note (Addendum)
Pt here for a repeat quantitative HCG.  Denies bleeding.  Pain is a 2 which is unchanged from yesterday

## 2017-07-12 ENCOUNTER — Other Ambulatory Visit: Payer: Self-pay | Admitting: Advanced Practice Midwife

## 2017-07-12 MED ORDER — PROGESTERONE 400 MG VA SUPP: 400.0000 mg | Freq: Every day | VAGINAL | 4 refills | Status: DC

## 2017-07-12 MED ORDER — PROGESTERONE MICRONIZED 200 MG PO CAPS: 200.0000 mg | ORAL_CAPSULE | Freq: Every day | ORAL | 2 refills | Status: DC

## 2017-08-04 ENCOUNTER — Encounter: Payer: Self-pay | Admitting: Pediatric Intensive Care

## 2017-08-17 NOTE — Congregational Nurse Program (Signed)
Congregational Nurse Program Note  Date of Encounter: 08/04/2017  Past Medical History: Past Medical History:  Diagnosis Date  . Anemia   . Irregular periods     Encounter Details: CNP Questionnaire - 08/04/17 1530      Questionnaire   Patient Status  Immigrant    Race  Hispanic or Latino    Location Patient Served At  [employer]  Not Applicable    Uninsured  Uninsured (NEW 1x/quarter)    Food  No food insecurities    Housing/Utilities  Yes, have permanent housing    Transportation  No transportation needs    Interpersonal Safety  Yes, feel physically and emotionally safe where you currently live    Medication  No medication insecurities    Medical Provider  Yes    Referrals  Not Applicable    ED Visit Averted  Not Applicable    Life-Saving Intervention Made  Not Applicable      Via interpreter Kari Ortega- client states she is pregnant and is looking for resources for dental cleaning. CN recommends asking at [facility] as they may have more resources for pregnant patients.

## 2017-08-24 ENCOUNTER — Ambulatory Visit (INDEPENDENT_AMBULATORY_CARE_PROVIDER_SITE_OTHER): Payer: Self-pay | Admitting: Obstetrics and Gynecology

## 2017-08-24 ENCOUNTER — Encounter: Payer: Self-pay | Admitting: Obstetrics and Gynecology

## 2017-08-24 DIAGNOSIS — Z113 Encounter for screening for infections with a predominantly sexual mode of transmission: Secondary | ICD-10-CM

## 2017-08-24 DIAGNOSIS — O0990 Supervision of high risk pregnancy, unspecified, unspecified trimester: Secondary | ICD-10-CM | POA: Insufficient documentation

## 2017-08-24 DIAGNOSIS — O0991 Supervision of high risk pregnancy, unspecified, first trimester: Secondary | ICD-10-CM

## 2017-08-24 DIAGNOSIS — O09529 Supervision of elderly multigravida, unspecified trimester: Secondary | ICD-10-CM | POA: Insufficient documentation

## 2017-08-24 DIAGNOSIS — Z23 Encounter for immunization: Secondary | ICD-10-CM

## 2017-08-24 DIAGNOSIS — O09521 Supervision of elderly multigravida, first trimester: Secondary | ICD-10-CM

## 2017-08-24 LAB — POCT URINALYSIS DIP (DEVICE)
Bilirubin Urine: NEGATIVE
GLUCOSE, UA: NEGATIVE mg/dL
HGB URINE DIPSTICK: NEGATIVE
Ketones, ur: NEGATIVE mg/dL
NITRITE: NEGATIVE
Protein, ur: NEGATIVE mg/dL
SPECIFIC GRAVITY, URINE: 1.02 (ref 1.005–1.030)
UROBILINOGEN UA: 0.2 mg/dL (ref 0.0–1.0)
pH: 6.5 (ref 5.0–8.0)

## 2017-08-24 NOTE — Patient Instructions (Addendum)
Primer trimestre de embarazo °(First Trimester of Pregnancy) °El primer trimestre de embarazo se extiende desde la semana 1 hasta el final de la semana 12 (mes 1 al mes 3). Durante este tiempo, el bebé comenzará a desarrollarse dentro suyo. Entre la semana 6 y la 8, se forman los ojos y el rostro, y los latidos del corazón pueden verse en la ecografía. Al final de las 12 semanas, todos los órganos del bebé están formados. La atención prenatal es toda la asistencia médica que usted recibe antes del nacimiento del bebé. Asegúrese de recibir una buena atención prenatal y de seguir todas las indicaciones del médico. °CUIDADOS EN EL HOGAR °Medicamentos: °· Tome los medicamentos solamente como se lo haya indicado el médico. Algunos medicamentos se pueden tomar durante el embarazo y otros no. °· Tome las vitaminas prenatales como se lo haya indicado el médico. °· Tome el medicamento que la ayuda a defecar (laxante suave) según sea necesario, si el médico lo autoriza. °Dieta °· Ingiera alimentos saludables de manera regular. °· El médico le indicará la cantidad de peso que puede aumentar. °· No coma carne cruda ni quesos sin cocinar. °· Si tiene malestar estomacal (náuseas) o vomita: °? Ingiera 4 o 5 comidas pequeñas por día en lugar de 3 abundantes. °? Intente comer algunas galletitas saladas. °? Beba líquidos entre las comidas, en lugar de hacerlo durante estas. °· Si tiene dificultad para defecar (estreñimiento): °? Consuma alimentos con alto contenido de fibra, como verduras y frutas frescos, y cereales integrales. °? Beba suficiente líquido para mantener el pis (orina) claro o de color amarillo pálido. °Actividad y ejercicios °· Haga ejercicios solamente como se lo haya indicado el médico. Deje de hacer ejercicios si tiene cólicos o dolor en la parte baja del vientre (abdomen) o en la cintura. °· Intente no estar de pie durante mucho tiempo. Mueva las piernas con frecuencia si debe estar de pie en un lugar durante  mucho tiempo. °· Evite levantar pesos excesivos. °· Use zapatos con tacones bajos. Mantenga una buena postura al sentarse y pararse. °· Puede tener relaciones sexuales, a menos que el médico le indique lo contrario. °Alivio del dolor o las molestias °· Use un sostén que le brinde buen soporte si le duelen las mamas. °· Dese baños con agua tibia (baños de asiento) para aliviar el dolor o las molestias a causa de las hemorroides. Use crema antihemorroidal si el médico se lo permite. °· Descanse con las piernas elevadas si tiene calambres o dolor de cintura. °· Use medias de descanso si tiene las venas de las piernas hinchadas y abultadas (venas varicosas). Eleve los pies durante 15 minutos, 3 o 4 veces por día. Limite la cantidad de sal en su dieta. °Cuidados prenatales °· Programe las visitas prenatales para la semana 12 de embarazo. °· Escriba sus preguntas. Llévelas cuando concurra a las visitas prenatales. °· Concurra a todas las visitas prenatales como se lo haya indicado el médico. °Seguridad °· Colóquese el cinturón de seguridad cuando conduzca. °· Haga una lista con los números de teléfono en caso de emergencia, en la cual deben incluirse los números de los familiares, los amigos, el hospital y los departamentos de policía y de bomberos. °Consejos generales °· Pídale al médico que la derive a clases prenatales en su localidad. Debe comenzar a tomar las clases antes de entrar en el mes 6 de embarazo. °· Pida ayuda si necesita asesoramiento o asistencia con la alimentación. El médico puede aconsejarla o indicarle dónde recurrir para recibir ayuda. °· No se dé baños de inmersión en agua caliente, baños   turcos ni saunas. °· No se haga duchas vaginales ni use tampones o toallas higiénicas perfumadas. °· No mantenga las piernas cruzadas durante mucho tiempo. °· Evite el contacto con las bandejas sanitarias de los gatos y la tierra que estos animales usan. °· No fume, no consuma hierbas ni beba alcohol. No tome  fármacos que el médico no haya autorizado. °· No consuma ningún producto que contenga tabaco, lo que incluye cigarrillos, tabaco de mascar o cigarrillos electrónicos. Si necesita ayuda para dejar de fumar, consulte al médico. Puede recibir asesoramiento u otro tipo de apoyo para dejar de fumar. °· Visite al dentista. En su casa, lávese los dientes con un cepillo dental suave. Pásese el hilo dental con suavidad. °SOLICITE AYUDA SI: °· Tiene mareos. °· Tiene cólicos leves o siente presión en la parte baja del vientre. °· Siente un dolor persistente en la zona del vientre. °· Sigue teniendo malestar estomacal, vomita o las heces son líquidas (diarrea). °· Observa una secreción, con mal olor que proviene de la vagina. °· Siente dolor al orinar. °· Tiene el rostro, las manos, las piernas o los tobillos más hinchados (inflamados). ° °SOLICITE AYUDA DE INMEDIATO SI: °· Tiene fiebre. °· Tiene una pérdida de líquido por la vagina. °· Tiene sangrado o pequeñas pérdidas vaginales. °· Tiene cólicos o dolor muy intensos en el vientre. °· Sube o baja de peso rápidamente. °· Vomita sangre. Puede ser similar a la borra del café °· Está en contacto con personas que tienen rubéola, la quinta enfermedad o varicela. °· Siente un dolor de cabeza muy intenso. °· Le falta el aire. °· Sufre cualquier tipo de traumatismo, por ejemplo, debido a una caída o un accidente automovilístico. ° °Esta información no tiene como fin reemplazar el consejo del médico. Asegúrese de hacerle al médico cualquier pregunta que tenga. °Document Released: 07/25/2008 Document Revised: 05/19/2014 Document Reviewed: 03/08/2013 °Elsevier Interactive Patient Education © 2017 Else[employer] Inc[employer] ° °

## 2017-08-24 NOTE — Progress Notes (Signed)
Subjective:  Kari Ortega is a 37 y.o. [id number] at [redacted]w[redacted]d being seen today for her first OB visit. Pregnancy complicated by need for REI (Dr. Kerin Perna) to Kari Ortega pregnancy. Took progesterone x 1 month. Has been released by REI.  Denies chronic medical problems or medications. Last pap smear 5/18, normal. She is currently monitored for the following issues for this high-risk pregnancy and has HYPERTRIGLYCERIDEMIA; Anemia; Supervision of high risk pregnancy, antepartum; and AMA (advanced maternal age) multigravida 35+ on their problem list.  Patient reports no complaints.  Contractions: Not present. Vag. Bleeding: None.  Movement: Absent. Denies leaking of fluid.   The following portions of the patient's history were reviewed and updated as appropriate: allergies, current medications, past family history, past medical history, past social history, past surgical history and problem list. Problem list updated.  Objective:   Vitals:   08/24/17 1033  BP: 111/66  Pulse: 76  Weight: 167 lb 8 oz (76 kg)    Fetal Status: Fetal Heart Rate (bpm): 168   Movement: Absent     General:  Alert, oriented and cooperative. Patient is in no acute distress.  Skin: Skin is warm and dry. No rash noted.   Cardiovascular: Normal heart rate noted  Respiratory: Normal respiratory effort, no problems with respiration noted  Abdomen: Soft, gravid, appropriate for gestational age. Pain/Pressure: Absent     Pelvic:  Cervical exam performed Dilation: Closed      Extremities: Normal range of motion.  Edema: None  Mental Status: Normal mood and affect. Normal behavior. Normal judgment and thought content.  Breast Sym supple no nipple discharge, adenopathy or masses  Urinalysis:      Assessment and Plan:  Pregnancy: E0P2330 at [redacted]w[redacted]d  1. Supervision of high risk pregnancy, antepartum Prenatal care and labs reviewed with pt Medical records from REI - Culture, OB Urine - Cystic fibrosis gene test -  Genetic Screening - Hemoglobinopathy Evaluation - Obstetric Panel, Including HIV - SMN1 COPY NUMBER ANALYSIS (SMA Carrier Screen) - Flu Vaccine QUAD 36+ mos IM - Cervicovaginal ancillary only  2. Multigravida of advanced maternal age in first trimester AMA reviewed with pt Panorama ordered - TSH - Hemoglobin A1c  Preterm labor symptoms and general obstetric precautions including but not limited to vaginal bleeding, contractions, leaking of fluid and fetal movement were reviewed in detail with the patient. Please refer to After Visit Summary for other counseling recommendations.  Return in about 1 month (around 09/21/2017) for OB visit.   Chancy Milroy, MD

## 2017-08-25 LAB — CERVICOVAGINAL ANCILLARY ONLY
Chlamydia: NEGATIVE
NEISSERIA GONORRHEA: NEGATIVE

## 2017-08-26 LAB — URINE CULTURE, OB REFLEX: Organism ID, Bacteria: NO GROWTH

## 2017-08-26 LAB — CULTURE, OB URINE

## 2017-08-31 ENCOUNTER — Encounter: Payer: Self-pay | Admitting: *Deleted

## 2017-08-31 LAB — CYSTIC FIBROSIS GENE TEST

## 2017-08-31 LAB — OBSTETRIC PANEL, INCLUDING HIV
ANTIBODY SCREEN: NEGATIVE
BASOS: 0 %
Basophils Absolute: 0 10*3/uL (ref 0.0–0.2)
EOS (ABSOLUTE): 0.4 10*3/uL (ref 0.0–0.4)
Eos: 4 %
HEMATOCRIT: 36.6 % (ref 34.0–46.6)
HEMOGLOBIN: 12.2 g/dL (ref 11.1–15.9)
HIV SCREEN 4TH GENERATION: NONREACTIVE
Hepatitis B Surface Ag: NEGATIVE
IMMATURE GRANS (ABS): 0 10*3/uL (ref 0.0–0.1)
Immature Granulocytes: 0 %
LYMPHS ABS: 2.2 10*3/uL (ref 0.7–3.1)
Lymphs: 21 %
MCH: 27.7 pg (ref 26.6–33.0)
MCHC: 33.3 g/dL (ref 31.5–35.7)
MCV: 83 fL (ref 79–97)
MONOS ABS: 0.7 10*3/uL (ref 0.1–0.9)
Monocytes: 7 %
NEUTROS ABS: 7.1 10*3/uL — AB (ref 1.4–7.0)
Neutrophils: 68 %
Platelets: 286 10*3/uL (ref 150–379)
RBC: 4.41 x10E6/uL (ref 3.77–5.28)
RDW: 16.4 % — AB (ref 12.3–15.4)
RPR Ser Ql: NONREACTIVE
Rh Factor: POSITIVE
Rubella Antibodies, IGG: 3.78 index (ref >0.99)
WBC: 10.4 10*3/uL (ref 3.4–10.8)

## 2017-08-31 LAB — HEMOGLOBINOPATHY EVALUATION
Ferritin: 40 ng/mL (ref 15–150)
HGB A: 97.5 % (ref 96.4–98.8)
HGB C: 0 %
HGB F QUANT: 0 % (ref 0.0–2.0)
HGB S: 0 %
HGB SOLUBILITY: NEGATIVE
Hgb A2 Quant: 2.5 % (ref 1.8–3.2)
Hgb Variant: 0 %

## 2017-08-31 LAB — SMN1 COPY NUMBER ANALYSIS (SMA CARRIER SCREENING)

## 2017-08-31 LAB — HEMOGLOBIN A1C
Est. average glucose Bld gHb Est-mCnc: 103 mg/dL
HEMOGLOBIN A1C: 5.2 % (ref 4.8–5.6)

## 2017-08-31 LAB — TSH: TSH: 1.42 u[IU]/mL (ref 0.450–4.500)

## 2017-09-16 ENCOUNTER — Encounter: Payer: Self-pay | Admitting: *Deleted

## 2017-09-21 ENCOUNTER — Ambulatory Visit (INDEPENDENT_AMBULATORY_CARE_PROVIDER_SITE_OTHER): Payer: Self-pay | Admitting: Obstetrics and Gynecology

## 2017-09-21 VITALS — BP 103/62 | HR 84 | Wt 167.8 lb

## 2017-09-21 DIAGNOSIS — O99210 Obesity complicating pregnancy, unspecified trimester: Secondary | ICD-10-CM | POA: Insufficient documentation

## 2017-09-21 DIAGNOSIS — E669 Obesity, unspecified: Secondary | ICD-10-CM | POA: Insufficient documentation

## 2017-09-21 DIAGNOSIS — Z758 Other problems related to medical facilities and other health care: Secondary | ICD-10-CM | POA: Insufficient documentation

## 2017-09-21 DIAGNOSIS — O09522 Supervision of elderly multigravida, second trimester: Secondary | ICD-10-CM

## 2017-09-21 DIAGNOSIS — D649 Anemia, unspecified: Secondary | ICD-10-CM

## 2017-09-21 DIAGNOSIS — O0990 Supervision of high risk pregnancy, unspecified, unspecified trimester: Secondary | ICD-10-CM

## 2017-09-21 DIAGNOSIS — Z789 Other specified health status: Secondary | ICD-10-CM

## 2017-09-21 MED ORDER — ASPIRIN EC 81 MG PO TBEC: 81.0000 mg | DELAYED_RELEASE_TABLET | Freq: Every day | ORAL | 3 refills | Status: DC

## 2017-09-21 NOTE — Progress Notes (Signed)
Spanish interpreter Doroteo Bradford present for visit

## 2017-09-21 NOTE — Progress Notes (Signed)
Prenatal Visit Note Date: 09/21/2017 Clinic: [facility]  Subjective:  Kari Ortega is a 37 y.o. [id number] at [redacted]w[redacted]d being seen today for ongoing prenatal care.  She is currently monitored for the following issues for this high-risk pregnancy and has HYPERTRIGLYCERIDEMIA; Anemia; Supervision of high risk pregnancy, antepartum; AMA (advanced maternal age) multigravida 35+; BMI 30-39.9; Obesity in pregnancy; and Language barrier on their problem list.  Patient reports no complaints.   Contractions: Not present. Vag. Bleeding: None.  Movement: Absent. Denies leaking of fluid.   The following portions of the patient's history were reviewed and updated as appropriate: allergies, current medications, past family history, past medical history, past social history, past surgical history and problem list. Problem list updated.  Objective:   Vitals:   09/21/17 0910  BP: 103/62  Pulse: 84  Weight: 167 lb 12.8 oz (76.1 kg)    Fetal Status: Fetal Heart Rate (bpm): 145   Movement: Absent     General:  Alert, oriented and cooperative. Patient is in no acute distress.  Skin: Skin is warm and dry. No rash noted.   Cardiovascular: Normal heart rate noted  Respiratory: Normal respiratory effort, no problems with respiration noted  Abdomen: Soft, gravid, appropriate for gestational age. Pain/Pressure: Absent     Pelvic:  Cervical exam deferred        Extremities: Normal range of motion.  Edema: None  Mental Status: Normal mood and affect. Normal behavior. Normal judgment and thought content.   Urinalysis:      Assessment and Plan:  Pregnancy: [id number] at [redacted]w[redacted]d  1. Supervision of high risk pregnancy, antepartum Routine care. Ask about afp nv. Anatomy u/s scheduled for 4-5wks. Pt amenable to starting low dose asa today - [place] MFM OB DETAIL +14 WK; Future  2. Multigravida of advanced maternal age in second trimester Low risk panorama reviewed with her. See above. -  [place] MFM OB DETAIL +14 WK; Future  3. BMI 30-39.9 Started at 160. D/w her re: weight gain goals for pregnancy  4. Obesity in pregnancy  5. Anemia, unspecified type Normal nob cbc.  6. Language barrier Interpreter used.   Preterm labor symptoms and general obstetric precautions including but not limited to vaginal bleeding, contractions, leaking of fluid and fetal movement were reviewed in detail with the patient. Please refer to After Visit Summary for other counseling recommendations.  Return in about 5 weeks (around 10/26/2017) for return ob.   Aletha Halim, MD

## 2017-09-24 ENCOUNTER — Encounter: Payer: Self-pay | Admitting: *Deleted

## 2017-10-13 ENCOUNTER — Encounter (HOSPITAL_COMMUNITY): Payer: Self-pay | Admitting: *Deleted

## 2017-10-13 ENCOUNTER — Other Ambulatory Visit: Payer: Self-pay

## 2017-10-13 ENCOUNTER — Inpatient Hospital Stay (HOSPITAL_COMMUNITY)
Admission: AD | Admit: 2017-10-13 | Discharge: 2017-10-13 | Disposition: A | Discharge status: Home / Self Care | Payer: Self-pay | Source: Ambulatory Visit | Attending: Family Medicine | Admitting: Family Medicine

## 2017-10-13 DIAGNOSIS — R109 Unspecified abdominal pain: Secondary | ICD-10-CM

## 2017-10-13 DIAGNOSIS — M549 Dorsalgia, unspecified: Secondary | ICD-10-CM | POA: Insufficient documentation

## 2017-10-13 DIAGNOSIS — O09892 Supervision of other high risk pregnancies, second trimester: Secondary | ICD-10-CM | POA: Insufficient documentation

## 2017-10-13 DIAGNOSIS — Z79899 Other long term (current) drug therapy: Secondary | ICD-10-CM | POA: Insufficient documentation

## 2017-10-13 DIAGNOSIS — Z7982 Long term (current) use of aspirin: Secondary | ICD-10-CM | POA: Insufficient documentation

## 2017-10-13 DIAGNOSIS — O26892 Other specified pregnancy related conditions, second trimester: Secondary | ICD-10-CM | POA: Insufficient documentation

## 2017-10-13 DIAGNOSIS — R1013 Epigastric pain: Secondary | ICD-10-CM | POA: Insufficient documentation

## 2017-10-13 DIAGNOSIS — Z833 Family history of diabetes mellitus: Secondary | ICD-10-CM | POA: Insufficient documentation

## 2017-10-13 DIAGNOSIS — O0990 Supervision of high risk pregnancy, unspecified, unspecified trimester: Secondary | ICD-10-CM

## 2017-10-13 LAB — URINALYSIS, ROUTINE W REFLEX MICROSCOPIC
BILIRUBIN URINE: NEGATIVE
Glucose, UA: NEGATIVE mg/dL
Hgb urine dipstick: NEGATIVE
KETONES UR: NEGATIVE mg/dL
Nitrite: NEGATIVE
PH: 6 (ref 5.0–8.0)
PROTEIN: NEGATIVE mg/dL
Specific Gravity, Urine: 1.012 (ref 1.005–1.030)

## 2017-10-13 LAB — CBC
HCT: 32.2 % — ABNORMAL LOW (ref 36.0–46.0)
Hemoglobin: 10.8 g/dL — ABNORMAL LOW (ref 12.0–15.0)
MCH: 28.3 pg (ref 26.0–34.0)
MCHC: 33.5 g/dL (ref 30.0–36.0)
MCV: 84.5 fL (ref 78.0–100.0)
Platelets: 269 10*3/uL (ref 150–400)
RBC: 3.81 MIL/uL — AB (ref 3.87–5.11)
RDW: 15.2 % (ref 11.5–15.5)
WBC: 12.4 10*3/uL — AB (ref 4.0–10.5)

## 2017-10-13 LAB — COMPREHENSIVE METABOLIC PANEL
ALT: 24 U/L (ref 14–54)
AST: 32 U/L (ref 15–41)
Albumin: 3 g/dL — ABNORMAL LOW (ref 3.5–5.0)
Alkaline Phosphatase: 69 U/L (ref 38–126)
Anion gap: 9 (ref 5–15)
BILIRUBIN TOTAL: 0.3 mg/dL (ref 0.3–1.2)
BUN: 6 mg/dL (ref 6–20)
CHLORIDE: 105 mmol/L (ref 101–111)
CO2: 21 mmol/L — ABNORMAL LOW (ref 22–32)
CREATININE: 0.56 mg/dL (ref 0.44–1.00)
Calcium: 8.3 mg/dL — ABNORMAL LOW (ref 8.9–10.3)
GFR calc Af Amer: >60 mL/min (ref >60)
Glucose, Bld: 92 mg/dL (ref 65–99)
Potassium: 3.8 mmol/L (ref 3.5–5.1)
Sodium: 135 mmol/L (ref 135–145)
TOTAL PROTEIN: 7.1 g/dL (ref 6.5–8.1)

## 2017-10-13 LAB — LIPASE, BLOOD: LIPASE: 23 U/L (ref 11–51)

## 2017-10-13 MED ORDER — FAMOTIDINE 20 MG PO TABS: 20.0000 mg | ORAL_TABLET | Freq: Two times a day (BID) | ORAL | 3 refills | Status: DC

## 2017-10-13 MED ORDER — GI COCKTAIL ~~LOC~~
30.0000 mL | Freq: Once | ORAL | Status: AC
  Administered 2017-10-13: 30 mL via ORAL
  Filled 2017-10-13: qty 30

## 2017-10-13 MED ORDER — ACETAMINOPHEN 500 MG PO TABS
1000.0000 mg | ORAL_TABLET | Freq: Once | ORAL | Status: AC
  Administered 2017-10-13: 1000 mg via ORAL
  Filled 2017-10-13: qty 2

## 2017-10-13 NOTE — MAU Note (Signed)
PT SAYS  WITH INTERPRETER- VIRIA-   HAS ABD PAIN - ALL OVER- STARTED  LAST NIGHT  WHILE IN BED.   [facility]- CLINIC.   NO MEDS FOR PAIN.

## 2017-10-13 NOTE — MAU Provider Note (Addendum)
History     CSN: [id number]  Arrival date and time: 10/13/17 0865   First Provider Initiated Contact with Patient 10/13/17 2049      Chief Complaint  Patient presents with  . Abdominal Pain   [id number] @17 .1 wks here with abd pain and epigastric pain. Pain started last night. Rates 6/10. Pain is bilateral upper and lower quadrants and epigastric region. No N/V/D/C. Denies heartburn or reflux. No fevers. Denies VB or discharge.   OB History    Gravida  5   Para  2   Term  2   Preterm      AB  2   Living  2     SAB  1   TAB      Ectopic  1   Multiple      Live Births  2           Past Medical History:  Diagnosis Date  . Anemia   . Irregular periods     History reviewed. No pertinent surgical history.  Family History  Problem Relation Age of Onset  . Diabetes Mother     Social History   Tobacco Use  . Smoking status: Never Smoker  . Smokeless tobacco: Never Used  Substance Use Topics  . Alcohol use: No  . Drug use: No    Allergies: No Known Allergies  Medications Prior to Admission  Medication Sig Dispense Refill Last Dose  . aspirin EC 81 MG tablet Take 1 tablet (81 mg total) by mouth daily. 60 tablet 3 10/12/2017 at Unknown time  . Prenatal Vit-Fe Fumarate-FA (MULTIVITAMIN-PRENATAL) 27-0.8 MG TABS tablet Take 1 tablet by mouth daily at 12 noon.   10/12/2017 at Unknown time  . fexofenadine (ALLEGRA) 180 MG tablet Take 1 tablet (180 mg total) by mouth daily. (Patient not taking: Reported on 09/21/2017)   Not Taking    Review of Systems  Constitutional: Negative for fever.  Gastrointestinal: Positive for abdominal pain. Negative for constipation, diarrhea, nausea and vomiting.  Genitourinary: Negative for vaginal bleeding and vaginal discharge.   Physical Exam   Blood pressure 105/64, pulse 82, temperature 98.1 F (36.7 C), temperature source Oral, resp. rate 20, height 4' 9.5" (1.461 m), weight 171 lb 4 oz (77.7 kg), last menstrual period  06/15/2017.  Physical Exam  Constitutional: She is oriented to person, place, and time. She appears well-developed and well-nourished. No distress.  HENT:  Head: Normocephalic and atraumatic.  Neck: Normal range of motion.  Cardiovascular: Normal rate, regular rhythm and normal heart sounds.  Respiratory: Effort normal and breath sounds normal. No respiratory distress. She has no wheezes. She has no rales.  GI: Soft. Bowel sounds are normal. She exhibits no distension and no mass. There is no tenderness. There is no rebound and no guarding.  Musculoskeletal: Normal range of motion.  Neurological: She is alert and oriented to person, place, and time.  Skin: Skin is warm and dry.  Psychiatric: She has a normal mood and affect.  FHT 145  Results for orders placed or performed during the hospital encounter of 10/13/17 (from the past 24 hour(s))  Urinalysis, Routine w reflex microscopic     Status: Abnormal   Collection Time: 10/13/17  7:56 PM  Result Value Ref Range   Color, Urine YELLOW YELLOW   APPearance CLEAR CLEAR   Specific Gravity, Urine 1.012 1.005 - 1.030   pH 6.0 5.0 - 8.0   Glucose, UA NEGATIVE NEGATIVE mg/dL   Hgb urine dipstick NEGATIVE  NEGATIVE   Bilirubin Urine NEGATIVE NEGATIVE   Ketones, ur NEGATIVE NEGATIVE mg/dL   Protein, ur NEGATIVE NEGATIVE mg/dL   Nitrite NEGATIVE NEGATIVE   Leukocytes, UA TRACE (A) NEGATIVE   RBC / HPF 0-5 0 - 5 RBC/hpf   WBC, UA 0-5 0 - 5 WBC/hpf   Bacteria, UA RARE (A) NONE SEEN   Squamous Epithelial / LPF 0-5 0 - 5   Mucus PRESENT   CBC     Status: Abnormal   Collection Time: 10/13/17  9:06 PM  Result Value Ref Range   WBC 12.4 (H) 4.0 - 10.5 K/uL   RBC 3.81 (L) 3.87 - 5.11 MIL/uL   Hemoglobin 10.8 (L) 12.0 - 15.0 g/dL   HCT 32.2 (L) 36.0 - 46.0 %   MCV 84.5 78.0 - 100.0 fL   MCH 28.3 26.0 - 34.0 pg   MCHC 33.5 30.0 - 36.0 g/dL   RDW 15.2 11.5 - 15.5 %   Platelets 269 150 - 400 K/uL  Comprehensive metabolic panel     Status:  Abnormal   Collection Time: 10/13/17  9:06 PM  Result Value Ref Range   Sodium 135 135 - 145 mmol/L   Potassium 3.8 3.5 - 5.1 mmol/L   Chloride 105 101 - 111 mmol/L   CO2 21 (L) 22 - 32 mmol/L   Glucose, Bld 92 65 - 99 mg/dL   BUN 6 6 - 20 mg/dL   Creatinine, Ser 0.56 0.44 - 1.00 mg/dL   Calcium 8.3 (L) 8.9 - 10.3 mg/dL   Total Protein 7.1 6.5 - 8.1 g/dL   Albumin 3.0 (L) 3.5 - 5.0 g/dL   AST 32 15 - 41 U/L   ALT 24 14 - 54 U/L   Alkaline Phosphatase 69 38 - 126 U/L   Total Bilirubin 0.3 0.3 - 1.2 mg/dL   GFR calc non Af Amer >60 >60 mL/min   GFR calc Af Amer >60 >60 mL/min   Anion gap 9 5 - 15  Lipase, blood     Status: None   Collection Time: 10/13/17  9:06 PM  Result Value Ref Range   Lipase 23 11 - 51 U/L   MAU Course  Procedures GI cocktail  MDM Labs ordered.  Transfer of care given to Cloyde Reams, [place]  10/13/2017 8:56 PM   2235 reassessment: epigastric pain improved, but abdominal pain unchanged.  Report given to and assumed by Darrol Poke, CNM @ [facility] 10/13/2017 10:35 PM  Tylenol 1,000mg  PO given for abdominal pain. Reassessment after medication- pt reports relief of pain in epigastric area and other abdominal pain. Discussed POC with patient with the prescription of Pepcid for epigastric pain. Educated on safe medications to take in pregnancy and the use of tylenol in pregnancy. Patient verbalizes understanding and agrees to [place]. Pt discharged.   Assessment and Plan   1. Epigastric abdominal pain during pregnancy in second trimester   2. Supervision of high risk pregnancy, antepartum   3. Abdominal pain during pregnancy in second trimester   4. Back pain during pregnancy in second trimester    Pt discharged. Pt stable at time of discharge. Follow up as scheduled for prenatal appointments  Return to MAU as needed for emergencies  Rx for [place] for [place] Follow up.    Specialty:  Obstetrics and Gynecology Why:  Follow up as scheduled for prenatal care  Contact information: [address]  [place] [phone]         Allergies as of 10/13/2017   No Known Allergies     Medication List    TAKE these medications   aspirin EC 81 MG tablet Take 1 tablet (81 mg total) by mouth daily.   famotidine 20 MG tablet Commonly known as:  PEPCID Take 1 tablet (20 mg total) by mouth 2 (two) times daily.   fexofenadine 180 MG tablet Commonly known as:  ALLEGRA Take 1 tablet (180 mg total) by mouth daily.   multivitamin-prenatal 27-0.8 MG Tabs tablet Take 1 tablet by mouth daily at 12 noon.      Lajean Manes, CNM 10/13/17, 11:36 PM

## 2017-10-13 NOTE — MAU Note (Signed)
Pt. States Pain in stomach 6/10 and under left breast 6/10. Pain started last night. Denies bleeding and discharge.

## 2017-10-19 ENCOUNTER — Encounter (HOSPITAL_COMMUNITY): Payer: Self-pay

## 2017-10-26 ENCOUNTER — Ambulatory Visit (HOSPITAL_COMMUNITY)
Admission: RE | Admit: 2017-10-26 | Discharge: 2017-10-26 | Disposition: A | Discharge status: Home / Self Care | Payer: Self-pay | Source: Ambulatory Visit | Attending: Obstetrics and Gynecology | Admitting: Obstetrics and Gynecology

## 2017-10-26 ENCOUNTER — Encounter (HOSPITAL_COMMUNITY): Payer: Self-pay

## 2017-10-26 ENCOUNTER — Other Ambulatory Visit: Payer: Self-pay | Admitting: Obstetrics and Gynecology

## 2017-10-26 ENCOUNTER — Other Ambulatory Visit (HOSPITAL_COMMUNITY): Payer: Self-pay | Admitting: *Deleted

## 2017-10-26 ENCOUNTER — Encounter: Payer: Self-pay | Admitting: Obstetrics & Gynecology

## 2017-10-26 DIAGNOSIS — O09522 Supervision of elderly multigravida, second trimester: Secondary | ICD-10-CM

## 2017-10-26 DIAGNOSIS — Z3689 Encounter for other specified antenatal screening: Secondary | ICD-10-CM

## 2017-10-26 DIAGNOSIS — Z3A19 19 weeks gestation of pregnancy: Secondary | ICD-10-CM

## 2017-10-26 DIAGNOSIS — O99212 Obesity complicating pregnancy, second trimester: Secondary | ICD-10-CM | POA: Insufficient documentation

## 2017-10-26 DIAGNOSIS — Z362 Encounter for other antenatal screening follow-up: Secondary | ICD-10-CM

## 2017-10-26 DIAGNOSIS — O0990 Supervision of high risk pregnancy, unspecified, unspecified trimester: Secondary | ICD-10-CM

## 2017-10-26 NOTE — ED Notes (Signed)
[place] interpreter utilized for initial interview.

## 2017-10-26 NOTE — Addendum Note (Signed)
Encounter addended by: Jeanene Erb, RT on: 10/26/2017 12:29 PM  Actions taken: Imaging Exam ended

## 2017-10-30 ENCOUNTER — Encounter: Payer: Self-pay | Admitting: Obstetrics and Gynecology

## 2017-10-30 DIAGNOSIS — O4440 Low lying placenta NOS or without hemorrhage, unspecified trimester: Secondary | ICD-10-CM | POA: Insufficient documentation

## 2017-11-05 ENCOUNTER — Telehealth: Payer: Self-pay | Admitting: *Deleted

## 2017-11-05 NOTE — Telephone Encounter (Signed)
-----   Message from Aletha Halim, MD sent at 10/30/2017  9:41 AM EDT ----- Please let her know that she has a low lying placenta and to do pelvic rest until her mid July follow up ultrasound is done. thanks

## 2017-11-05 NOTE — Telephone Encounter (Signed)
Called patient with [facility] [phone] and informed her results and reccommendations per Dr. Ilda Basset. Instructed patient to come to MAU if she has bleeding.  Also discussed she missed her ob fu appt and I will ask registrars to call her with new obfu appt. She voices understanding.

## 2017-11-18 ENCOUNTER — Encounter: Payer: Self-pay | Admitting: Family Medicine

## 2017-11-19 ENCOUNTER — Ambulatory Visit (INDEPENDENT_AMBULATORY_CARE_PROVIDER_SITE_OTHER): Payer: Self-pay | Admitting: Obstetrics and Gynecology

## 2017-11-19 VITALS — BP 129/85 | HR 100 | Wt 174.0 lb

## 2017-11-19 DIAGNOSIS — O09523 Supervision of elderly multigravida, third trimester: Secondary | ICD-10-CM

## 2017-11-19 DIAGNOSIS — E669 Obesity, unspecified: Secondary | ICD-10-CM

## 2017-11-19 DIAGNOSIS — Z789 Other specified health status: Secondary | ICD-10-CM

## 2017-11-19 DIAGNOSIS — O0990 Supervision of high risk pregnancy, unspecified, unspecified trimester: Secondary | ICD-10-CM

## 2017-11-19 DIAGNOSIS — O4440 Low lying placenta NOS or without hemorrhage, unspecified trimester: Secondary | ICD-10-CM

## 2017-11-19 DIAGNOSIS — O99210 Obesity complicating pregnancy, unspecified trimester: Secondary | ICD-10-CM

## 2017-11-19 MED ORDER — PSYLLIUM 58.6 % PO POWD: 1.0000 | Freq: Three times a day (TID) | ORAL | 12 refills | Status: DC

## 2017-11-19 MED ORDER — SIMETHICONE 80 MG PO CHEW: 80.0000 mg | CHEWABLE_TABLET | Freq: Four times a day (QID) | ORAL | 0 refills | Status: DC | PRN

## 2017-11-19 NOTE — Progress Notes (Signed)
Prenatal Visit Note Date: 11/19/2017 Clinic: [facility]  Subjective:  Kari Ortega is a 37 y.o. [id number] at [redacted]w[redacted]d being seen today for ongoing prenatal care.  She is currently monitored for the following issues for this high-risk pregnancy and has HYPERTRIGLYCERIDEMIA; Supervision of high risk pregnancy, antepartum; AMA (advanced maternal age) multigravida 35+; BMI 30-39.9; Obesity in pregnancy; Language barrier; and Low-lying placenta on their problem list.  Patient reports no complaints.   Contractions: Not present. Vag. Bleeding: None.  Movement: Present. Denies leaking of fluid.   The following portions of the patient's history were reviewed and updated as appropriate: allergies, current medications, past family history, past medical history, past social history, past surgical history and problem list. Problem list updated.  Objective:   Vitals:   11/19/17 1333  BP: 129/85  Pulse: 100  Weight: 174 lb (78.9 kg)    Fetal Status: Fetal Heart Rate (bpm): 157   Movement: Present     General:  Alert, oriented and cooperative. Patient is in no acute distress.  Skin: Skin is warm and dry. No rash noted.   Cardiovascular: Normal heart rate noted  Respiratory: Normal respiratory effort, no problems with respiration noted  Abdomen: Soft, gravid, appropriate for gestational age. Pain/Pressure: Present     Pelvic:  Cervical exam deferred        Extremities: Normal range of motion.  Edema: None  Mental Status: Normal mood and affect. Normal behavior. Normal judgment and thought content.   Urinalysis:      Assessment and Plan:  Pregnancy: Q4B2010 at [redacted]w[redacted]d  1. Supervision of high risk pregnancy, antepartum Routine care. D/w pt re: BC in later visits  2. Multigravida of advanced maternal age in third trimester No issues  3. Obesity in pregnancy 14lbs weight gain thus far d/w pt and goals  4. BMI 30-39.9  5. Language barrier Interpreter  used  6. Low-lying placenta Continue pelvic rest. Has rpt u/s for next week already scheduled.   Preterm labor symptoms and general obstetric precautions including but not limited to vaginal bleeding, contractions, leaking of fluid and fetal movement were reviewed in detail with the patient. Please refer to After Visit Summary for other counseling recommendations.  Return in about 3 weeks (around 12/10/2017) for hrob.   Aletha Halim, MD

## 2017-11-19 NOTE — Progress Notes (Signed)
Video Interpreter # [id number]

## 2017-11-23 ENCOUNTER — Ambulatory Visit (HOSPITAL_COMMUNITY)
Admission: RE | Admit: 2017-11-23 | Discharge: 2017-11-23 | Disposition: A | Discharge status: Home / Self Care | Payer: Self-pay | Source: Ambulatory Visit | Attending: Physician Assistant | Admitting: Physician Assistant

## 2017-11-23 ENCOUNTER — Other Ambulatory Visit (HOSPITAL_COMMUNITY): Payer: Self-pay | Admitting: Obstetrics and Gynecology

## 2017-11-23 ENCOUNTER — Encounter (HOSPITAL_COMMUNITY): Payer: Self-pay

## 2017-11-23 DIAGNOSIS — Z362 Encounter for other antenatal screening follow-up: Secondary | ICD-10-CM

## 2017-11-23 DIAGNOSIS — Z3A23 23 weeks gestation of pregnancy: Secondary | ICD-10-CM

## 2017-11-23 DIAGNOSIS — O4442 Low lying placenta NOS or without hemorrhage, second trimester: Secondary | ICD-10-CM

## 2017-11-23 DIAGNOSIS — O99212 Obesity complicating pregnancy, second trimester: Secondary | ICD-10-CM

## 2017-11-23 DIAGNOSIS — O09522 Supervision of elderly multigravida, second trimester: Secondary | ICD-10-CM

## 2017-11-25 ENCOUNTER — Ambulatory Visit: Payer: Self-pay | Attending: Physician Assistant

## 2017-12-10 ENCOUNTER — Ambulatory Visit (INDEPENDENT_AMBULATORY_CARE_PROVIDER_SITE_OTHER): Payer: Self-pay | Admitting: Obstetrics and Gynecology

## 2017-12-10 ENCOUNTER — Encounter: Payer: Self-pay | Admitting: Obstetrics and Gynecology

## 2017-12-10 VITALS — BP 118/73 | HR 88 | Wt 179.6 lb

## 2017-12-10 DIAGNOSIS — O4440 Low lying placenta NOS or without hemorrhage, unspecified trimester: Secondary | ICD-10-CM

## 2017-12-10 DIAGNOSIS — Z789 Other specified health status: Secondary | ICD-10-CM

## 2017-12-10 DIAGNOSIS — O0990 Supervision of high risk pregnancy, unspecified, unspecified trimester: Secondary | ICD-10-CM

## 2017-12-10 DIAGNOSIS — O09522 Supervision of elderly multigravida, second trimester: Secondary | ICD-10-CM

## 2017-12-10 DIAGNOSIS — O4442 Low lying placenta NOS or without hemorrhage, second trimester: Secondary | ICD-10-CM

## 2017-12-10 DIAGNOSIS — O0992 Supervision of high risk pregnancy, unspecified, second trimester: Secondary | ICD-10-CM

## 2017-12-10 NOTE — Progress Notes (Signed)
Subjective:  Kari Ortega is a 37 y.o. [id number] at [redacted]w[redacted]d being seen today for ongoing prenatal care.  She is currently monitored for the following issues for this high-risk pregnancy and has HYPERTRIGLYCERIDEMIA; Supervision of high risk pregnancy, antepartum; AMA (advanced maternal age) multigravida 35+; BMI 30-39.9; Obesity in pregnancy; Language barrier; and Low-lying placenta on their problem list.  Patient reports no complaints.  Contractions: Not present. Vag. Bleeding: None.  Movement: Present. Denies leaking of fluid.   The following portions of the patient's history were reviewed and updated as appropriate: allergies, current medications, past family history, past medical history, past social history, past surgical history and problem list. Problem list updated.  Objective:   Vitals:   12/10/17 0928  BP: 118/73  Pulse: 88  Weight: 179 lb 9.6 oz (81.5 kg)    Fetal Status: Fetal Heart Rate (bpm): 155   Movement: Present     General:  Alert, oriented and cooperative. Patient is in no acute distress.  Skin: Skin is warm and dry. No rash noted.   Cardiovascular: Normal heart rate noted  Respiratory: Normal respiratory effort, no problems with respiration noted  Abdomen: Soft, gravid, appropriate for gestational age. Pain/Pressure: Absent     Pelvic:  Cervical exam deferred        Extremities: Normal range of motion.  Edema: Trace  Mental Status: Normal mood and affect. Normal behavior. Normal judgment and thought content.   Urinalysis:      Assessment and Plan:  Pregnancy: V7C5885 at [redacted]w[redacted]d  1. Supervision of high risk pregnancy, antepartum Stable Glucola next visit  2. Multigravida of advanced maternal age in second trimester   3. Low-lying placenta Resolved on 11/23/17 U/S  4. Language barrier Interrupter used during today's visit  Preterm labor symptoms and general obstetric precautions including but not limited to vaginal bleeding, contractions,  leaking of fluid and fetal movement were reviewed in detail with the patient. Please refer to After Visit Summary for other counseling recommendations.  Return in about 3 weeks (around 12/31/2017) for OB visit.   Chancy Milroy, MD

## 2017-12-10 NOTE — Patient Instructions (Addendum)
Segundo trimestre de Media planner (Second Trimester of Pregnancy) El segundo trimestre va desde la semana13 hasta la 64, desde el cuarto hasta el sexto mes, y suele ser el momento en el que mejor se siente. En general, las nuseas matutinas han disminuido o han desaparecido completamente. Tendr ms energa y podr aumentarle el apetito. El beb por nacer (feto) se desarrolla rpidamente. Hacia el final del sexto mes, el beb mide aproximadamente 9 pulgadas (23 cm) y pesa alrededor de 1 libras (700 g). Es probable que sienta al beb moverse (dar pataditas) entre las 18 y 77 semanas del Media planner. CUIDADOS EN EL HOGAR  No fume, no consuma hierbas ni beba alcohol. No tome frmacos que el mdico no haya autorizado.  No consuma ningn producto que contenga tabaco, lo que incluye cigarrillos, tabaco de Higher education careers adviser o Psychologist, sport and exercise. Si necesita ayuda para dejar de fumar, consulte al [employer]. Puede recibir asesoramiento u otro tipo de apoyo para dejar de fumar.  Tome los medicamentos solamente como se lo haya indicado el mdico. Algunos medicamentos son seguros para tomar durante el Media planner y otros no lo son.  Haga ejercicios solamente como se lo haya indicado el mdico. Interrumpa la actividad fsica si comienza a tener calambres.  Ingiera alimentos saludables de [place] regular.  Use un sostn que le brinde buen soporte si sus mamas estn sensibles.  No se d baos de inmersin en agua caliente, baos turcos ni saunas.  Colquese el cinturn de seguridad cuando conduzca.  No coma carne cruda ni queso sin cocinar; evite el contacto con las bandejas sanitarias de los gatos y la tierra que estos animales usan.  Seama.  Tome entre 1500 y 2082m de calcio diariamente comenzando en la [id number] embarazo h[place]ins[place]  Pruebe tomar un medicamento que la ayude a defecar (un laxante suave) si el mdico lo autoriza. Consuma ms fibra, que se encuentra en las frutas y  verduras frescas y los cereales integrales. Beba suficiente lquido para mantener el pis (orina) claro o de color amarillo plido.  Dese baos de asiento con agua tibia para aBest boyo las molestias causadas por las hemorroides. Use una crema para las hemorroides si el mdico la autoriza.  Si se le hinchan las venas (venas varicosas), use medias de descanso. Levante (eleve) los pies durante 168mutos, 3 o 4veces por daTraining and development officerLimite el consumo de sal en su dieta.  No levante objetos pesados, use zapatos de tacones bajos y sintese derecha.  Descanse con las piernas elevadas si tiene calambres o dolor de cintura.  Visite a su dentista si no lo ha heQuarry managerUse un cepillo de cerdas suaves para cepillarse los dientes. Psese el hilo dental con suavidad.  Puede seguir maAmerican Electric Powera menos que el mdico le indique lo contrario.  Concurra a los controles mdicos.  SOLICITE AYUDA SI:  Siente mareos.  Sufre calambres o presin leves en la parte baja del vientre (abdomen).  Sufre un dolor persistente en el abdomen.  Tiene maHigher education careers advisernuseas), vmitos, o tiene deposiciones acuosas (diarrea).  Advierte un olor ftido que proviene de la vagina.  Siente dolor al orContinental Airlines SOLICITE AYUDA DE INMEDIATO SI:  Tiene fiebre.  Tiene una prdida de lquido por la vagina.  Tiene sangrado o pequeas prdidas vaginales.  Siente dolor intenso o clicos en el abdomen.  Sube o baja de peso rpidamente.  Tiene dificultades para recuperar el aliento y siente dolor en el pecho.  Sbitamente se  le hinchan mucho el rostro, las [place], los tobillos, los pies o las piernas.  No ha sentido los movimientos del beb durante [place].  Siente un dolor de cabeza intenso que no se alivia con medicamentos.  Su visin se modifica.  Esta informacin no tiene Marine scientist el consejo del mdico. Asegrese de hacerle al mdico cualquier pregunta que  tenga. Document Released: 12/29/2012 Document Revised: 05/19/2014 Document Reviewed: 06/29/2012 Elsevier Interactive Patient Education  2017 [employer].

## 2017-12-10 NOTE — Progress Notes (Signed)
[place] # [id number], [place]/ Pt is concerned about darkening around her neck area.

## 2017-12-21 ENCOUNTER — Encounter (INDEPENDENT_AMBULATORY_CARE_PROVIDER_SITE_OTHER): Payer: Self-pay | Admitting: Physician Assistant

## 2017-12-21 ENCOUNTER — Ambulatory Visit (INDEPENDENT_AMBULATORY_CARE_PROVIDER_SITE_OTHER): Payer: Self-pay | Admitting: Physician Assistant

## 2017-12-21 ENCOUNTER — Other Ambulatory Visit: Payer: Self-pay

## 2017-12-21 VITALS — BP 116/79 | HR 88 | Temp 97.8°F | Ht <= 58 in | Wt 182.4 lb

## 2017-12-21 DIAGNOSIS — R1013 Epigastric pain: Secondary | ICD-10-CM

## 2017-12-21 DIAGNOSIS — K089 Disorder of teeth and supporting structures, unspecified: Secondary | ICD-10-CM

## 2017-12-21 MED ORDER — RANITIDINE HCL 150 MG PO TABS: 150.0000 mg | ORAL_TABLET | Freq: Two times a day (BID) | ORAL | 0 refills | Status: DC
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
## 2017-12-21 NOTE — Patient Instructions (Signed)
Dolor abdominal durante el embarazo (Abdominal Pain During Pregnancy) El dolor de vientre (abdominal) es habitual durante el embarazo. Generalmente no se trata de un problema grave. Otras veces puede ser un signo de que algo no anda bien. Siempre comunquese con su mdico si tiene dolor abdominal. CUIDADOS EN EL HOGAR Controle el dolor para ver si hay cambios. Las indicaciones que siguen pueden ayudarla a sentirse mejor:  Optician, dispensing (relaciones sexuales) ni se coloque nada dentro de la vagina hasta que se sienta mejor.  Haga reposo hasta que el dolor se calme.  Si siente ganas de vomitar (nuseas ) beba lquidos claros. No consuma alimentos slidos hasta que se sienta mejor.  Slo tome los medicamentos que le haya indicado su mdico.  Cumpla con las visitas al mdico segn las indicaciones. SOLICITE AYUDA DE INMEDIATO SI:  Tiene un sangrado, pierde lquido o elimina trozos de tejido por la vagina.  Siente ms dolor o clicos.  Comienza a vomitar.  Siente dolor al orinar u observa sangre en la orina.  Tiene fiebre.  No siente que el beb se mueva mucho.  Se siente muy dbil o cree que va a desmayarse.  Tiene dificultad para respirar con o sin dolor en el vientre.  Siente un dolor de cabeza muy intenso y Social research officer, government en el vientre.  Observa que sale un lquido por la vagina y tiene dolor abdominal.  La materia fecal es lquida (diarrea).  El dolor en el viente no desaparece, o empeora, luego de hacer reposo. ASEGRESE DE QUE:  Comprende estas instrucciones.  Controlar su afeccin.  Recibir ayuda de inmediato si no mejora o si empeora. Esta informacin no tiene Marine scientist el consejo del mdico. Asegrese de hacerle al mdico cualquier pregunta que tenga. Document Released: 01/08/2011 Document Revised: 08/20/2015 Document Reviewed: 11/25/2012 Elsevier Interactive Patient Education  [employer].

## 2017-12-22 LAB — CBC WITH DIFFERENTIAL/PLATELET
BASOS: 0 %
Basophils Absolute: 0 10*3/uL (ref 0.0–0.2)
EOS (ABSOLUTE): 0.2 10*3/uL (ref 0.0–0.4)
EOS: 1 %
HEMATOCRIT: 33.2 % — AB (ref 34.0–46.6)
Hemoglobin: 10.8 g/dL — ABNORMAL LOW (ref 11.1–15.9)
IMMATURE GRANS (ABS): 0.2 10*3/uL — AB (ref 0.0–0.1)
Immature Granulocytes: 2 %
LYMPHS: 13 %
Lymphocytes Absolute: 1.8 10*3/uL (ref 0.7–3.1)
MCH: 28.2 pg (ref 26.6–33.0)
MCHC: 32.5 g/dL (ref 31.5–35.7)
MCV: 87 fL (ref 79–97)
Monocytes Absolute: 1 10*3/uL — ABNORMAL HIGH (ref 0.1–0.9)
Monocytes: 8 %
NEUTROS PCT: 76 %
Neutrophils Absolute: 10.2 10*3/uL — ABNORMAL HIGH (ref 1.4–7.0)
PLATELETS: 305 10*3/uL (ref 150–450)
RBC: 3.83 x10E6/uL (ref 3.77–5.28)
RDW: 15.7 % — ABNORMAL HIGH (ref 12.3–15.4)
WBC: 13.4 10*3/uL — ABNORMAL HIGH (ref 3.4–10.8)

## 2017-12-22 LAB — LIPASE: Lipase: 16 U/L (ref 14–72)

## 2017-12-22 LAB — COMPREHENSIVE METABOLIC PANEL
A/G RATIO: 1.5 (ref 1.2–2.2)
ALT: 31 IU/L (ref 0–32)
AST: 50 IU/L — ABNORMAL HIGH (ref 0–40)
Albumin: 3.8 g/dL (ref 3.5–5.5)
Alkaline Phosphatase: 101 IU/L (ref 39–117)
BUN/Creatinine Ratio: 16 (ref 9–23)
BUN: 6 mg/dL (ref 6–20)
Bilirubin Total: <0.2 mg/dL (ref 0.0–1.2)
CO2: 20 mmol/L (ref 20–29)
Calcium: 8.8 mg/dL (ref 8.7–10.2)
Chloride: 105 mmol/L (ref 96–106)
Creatinine, Ser: 0.37 mg/dL — ABNORMAL LOW (ref 0.57–1.00)
GFR calc Af Amer: 158 mL/min/{1.73_m2} (ref >59)
GFR calc non Af Amer: 137 mL/min/{1.73_m2} (ref >59)
Globulin, Total: 2.5 g/dL (ref 1.5–4.5)
Glucose: 99 mg/dL (ref 65–99)
POTASSIUM: 4 mmol/L (ref 3.5–5.2)
Sodium: 139 mmol/L (ref 134–144)
Total Protein: 6.3 g/dL (ref 6.0–8.5)

## 2017-12-24 ENCOUNTER — Telehealth (INDEPENDENT_AMBULATORY_CARE_PROVIDER_SITE_OTHER): Payer: Self-pay

## 2017-12-24 NOTE — Telephone Encounter (Signed)
Call placed using [place] interpreter [phone]) patient is aware that white blood cells are still elevated; maybe due to her pregnancy. Stable anemia. Slight liver inflammation but it is not concerning at this point; sometimes fatty meals can cause elevation. Pancreas normal. Patient expressed understanding. Nat Christen, CMA

## 2017-12-24 NOTE — Telephone Encounter (Signed)
-----   Message from Clent Demark, PA-C sent at 12/23/2017 12:50 PM EDT ----- Dema Severin blood cells still elevated. May be due to her pregnancy. Anemia is stable. Slight liver inflammation but not concerning at this point, sometimes fatty meals can cause elevation. Pancreas normal.

## 2017-12-28 ENCOUNTER — Other Ambulatory Visit: Payer: Self-pay

## 2017-12-28 ENCOUNTER — Encounter: Payer: Self-pay | Admitting: Obstetrics and Gynecology

## 2017-12-28 ENCOUNTER — Ambulatory Visit (INDEPENDENT_AMBULATORY_CARE_PROVIDER_SITE_OTHER): Payer: Self-pay | Admitting: Obstetrics and Gynecology

## 2017-12-28 VITALS — BP 114/72 | HR 79 | Wt 181.1 lb

## 2017-12-28 DIAGNOSIS — O0993 Supervision of high risk pregnancy, unspecified, third trimester: Secondary | ICD-10-CM

## 2017-12-28 DIAGNOSIS — O09523 Supervision of elderly multigravida, third trimester: Secondary | ICD-10-CM

## 2017-12-28 DIAGNOSIS — O09522 Supervision of elderly multigravida, second trimester: Secondary | ICD-10-CM

## 2017-12-28 DIAGNOSIS — O0990 Supervision of high risk pregnancy, unspecified, unspecified trimester: Secondary | ICD-10-CM

## 2017-12-28 DIAGNOSIS — Z23 Encounter for immunization: Secondary | ICD-10-CM

## 2017-12-28 DIAGNOSIS — Z789 Other specified health status: Secondary | ICD-10-CM

## 2017-12-28 NOTE — Progress Notes (Signed)
Subjective:  Kari Ortega is a 37 y.o. [id number] at [redacted]w[redacted]d being seen today for ongoing prenatal care.  She is currently monitored for the following issues for this high-risk pregnancy and has HYPERTRIGLYCERIDEMIA; Supervision of high risk pregnancy, antepartum; AMA (advanced maternal age) multigravida 35+; BMI 30-39.9; Obesity in pregnancy; and Language barrier on their problem list.  Patient reports no complaints.  Contractions: Not present. Vag. Bleeding: None.  Movement: Present. Denies leaking of fluid.   The following portions of the patient's history were reviewed and updated as appropriate: allergies, current medications, past family history, past medical history, past social history, past surgical history and problem list. Problem list updated.  Objective:   Vitals:   12/28/17 0832  BP: 114/72  Pulse: 79  Weight: 181 lb 1.6 oz (82.1 kg)    Fetal Status: Fetal Heart Rate (bpm): 159   Movement: Present     General:  Alert, oriented and cooperative. Patient is in no acute distress.  Skin: Skin is warm and dry. No rash noted.   Cardiovascular: Normal heart rate noted  Respiratory: Normal respiratory effort, no problems with respiration noted  Abdomen: Soft, gravid, appropriate for gestational age. Pain/Pressure: Absent     Pelvic:  Cervical exam deferred        Extremities: Normal range of motion.  Edema: Trace  Mental Status: Normal mood and affect. Normal behavior. Normal judgment and thought content.   Urinalysis:      Assessment and Plan:  Pregnancy: [id number] at [redacted]w[redacted]d  1. Supervision of high risk pregnancy, antepartum Stable Glucola and 28 week labs today TDAP today  2. Multigravida of advanced maternal age in second trimester Panorama, LR  3. Language barrier Interrupter used during today's visit  Preterm labor symptoms and general obstetric precautions including but not limited to vaginal bleeding, contractions, leaking of fluid and fetal movement  were reviewed in detail with the patient. Please refer to After Visit Summary for other counseling recommendations.  Return in about 2 weeks (around 01/11/2018) for OB visit.   Chancy Milroy, MD

## 2017-12-28 NOTE — Addendum Note (Signed)
Addended by: Derinda Late L on: 12/28/2017 09:00 AM   Modules accepted: Orders

## 2017-12-28 NOTE — Patient Instructions (Signed)
Tercer trimestre de embarazo (Third Trimester of Pregnancy) El tercer trimestre comprende desde la semana29 hasta la semana42, es decir, desde el mes7 hasta el mes9. En este trimestre, el feto crece muy rpido. Hacia el final del noveno mes, el feto mide alrededor de 20pulgadas (45cm) de largo y pesa entre 6y 10libras (2,700y 4,500kg). CUIDADOS EN EL HOGAR  No fume, no consuma hierbas ni beba alcohol. No tome frmacos que el mdico no haya autorizado.  No consuma ningn producto que contenga tabaco, lo que incluye cigarrillos, tabaco de mascar o cigarrillos electrnicos. Si necesita ayuda para dejar de fumar, consulte al mdico. Puede recibir asesoramiento u otro tipo de apoyo para dejar de fumar.  Tome los medicamentos solamente como se lo haya indicado el mdico. Algunos medicamentos son seguros para tomar durante el embarazo y otros no lo son.  Haga ejercicios solamente como se lo haya indicado el mdico. Interrumpa la actividad fsica si comienza a tener calambres.  Ingiera alimentos saludables de manera regular.  Use un sostn que le brinde buen soporte si sus mamas estn sensibles.  No se d baos de inmersin en agua caliente, baos turcos ni saunas.  Colquese el cinturn de seguridad cuando conduzca.  No coma carne cruda ni queso sin cocinar; evite el contacto con las bandejas sanitarias de los gatos y la tierra que estos animales usan.  Tome las vitaminas prenatales.  Tome entre 1500 y 2000mg de calcio diariamente comenzando en la semana20 del embarazo hasta el parto.  Pruebe tomar un medicamento que la ayude a defecar (un laxante suave) si el mdico lo autoriza. Consuma ms fibra, que se encuentra en las frutas y verduras frescas y los cereales integrales. Beba suficiente lquido para mantener el pis (orina) claro o de color amarillo plido.  Dese baos de asiento con agua tibia para aliviar el dolor o las molestias causadas por las hemorroides. Use una crema para  las hemorroides si el mdico la autoriza.  Si se le hinchan las venas (venas varicosas), use medias de descanso. Levante (eleve) los pies durante 15minutos, 3 o 4veces por da. Limite el consumo de sal en su dieta.  No levante objetos pesados, use zapatos de tacones bajos y sintese derecha.  Descanse con las piernas elevadas si tiene calambres o dolor de cintura.  Visite a su dentista si no lo ha hecho durante el embarazo. Use un cepillo de cerdas suaves para cepillarse los dientes. Psese el hilo dental con suavidad.  Puede seguir manteniendo relaciones sexuales, a menos que el mdico le indique lo contrario.  No haga viajes de larga distancia, excepto si es obligatorio y solamente con la aprobacin del mdico.  Tome clases prenatales.  Practique ir manejando al hospital.  Prepare el bolso que llevar al hospital.  Prepare la habitacin del beb.  Concurra a los controles mdicos.  SOLICITE AYUDA SI:  No est segura de si est en trabajo de parto o si ha roto la bolsa de las aguas.  Tiene mareos.  Siente calambres leves o presin en la parte inferior del abdomen.  Sufre un dolor persistente en el abdomen.  Tiene malestar estomacal (nuseas), vmitos, o tiene deposiciones acuosas (diarrea).  Advierte un olor ftido que proviene de la vagina.  Siente dolor al orinar.  SOLICITE AYUDA DE INMEDIATO SI:  Tiene fiebre.  Tiene una prdida de lquido por la vagina.  Tiene sangrado o pequeas prdidas vaginales.  Siente dolor intenso o clicos en el abdomen.  Sube o baja de peso rpidamente.    Tiene dificultades para recuperar el aliento y siente dolor en el pecho.  Sbitamente se le hinchan mucho el rostro, las manos, los tobillos, los pies o las piernas.  No ha sentido los movimientos del beb durante una hora.  Siente un dolor de cabeza intenso que no se alivia con medicamentos.  Su visin se modifica.  Esta informacin no tiene como fin reemplazar el consejo  del mdico. Asegrese de hacerle al mdico cualquier pregunta que tenga. Document Released: 12/29/2012 Document Revised: 05/19/2014 Document Reviewed: 06/29/2012 Elsevier Interactive Patient Education  2017 Elsevier Inc.  

## 2017-12-29 LAB — CBC
HEMATOCRIT: 33.8 % — AB (ref 34.0–46.6)
HEMOGLOBIN: 10.9 g/dL — AB (ref 11.1–15.9)
MCH: 27.9 pg (ref 26.6–33.0)
MCHC: 32.2 g/dL (ref 31.5–35.7)
MCV: 86 fL (ref 79–97)
Platelets: 301 10*3/uL (ref 150–450)
RBC: 3.91 x10E6/uL (ref 3.77–5.28)
RDW: 14.9 % (ref 12.3–15.4)
WBC: 13.2 10*3/uL — ABNORMAL HIGH (ref 3.4–10.8)

## 2017-12-29 LAB — RPR: RPR: NONREACTIVE

## 2017-12-29 LAB — GLUCOSE TOLERANCE, 2 HOURS W/ 1HR
GLUCOSE, 1 HOUR: 148 mg/dL (ref 65–179)
Glucose, 2 hour: 130 mg/dL (ref 65–152)
Glucose, Fasting: 83 mg/dL (ref 65–91)

## 2017-12-29 LAB — HIV ANTIBODY (ROUTINE TESTING W REFLEX): HIV Screen 4th Generation wRfx: NONREACTIVE

## 2018-01-12 ENCOUNTER — Encounter (HOSPITAL_COMMUNITY): Payer: Self-pay

## 2018-01-12 ENCOUNTER — Inpatient Hospital Stay (HOSPITAL_COMMUNITY)
Admission: AD | Admit: 2018-01-12 | Discharge: 2018-01-12 | Disposition: A | Discharge status: Home / Self Care | Payer: Self-pay | Source: Ambulatory Visit | Attending: Obstetrics and Gynecology | Admitting: Obstetrics and Gynecology

## 2018-01-12 DIAGNOSIS — O26893 Other specified pregnancy related conditions, third trimester: Secondary | ICD-10-CM | POA: Insufficient documentation

## 2018-01-12 DIAGNOSIS — Z79899 Other long term (current) drug therapy: Secondary | ICD-10-CM | POA: Insufficient documentation

## 2018-01-12 DIAGNOSIS — R109 Unspecified abdominal pain: Secondary | ICD-10-CM | POA: Insufficient documentation

## 2018-01-12 DIAGNOSIS — Z9889 Other specified postprocedural states: Secondary | ICD-10-CM | POA: Insufficient documentation

## 2018-01-12 DIAGNOSIS — O0990 Supervision of high risk pregnancy, unspecified, unspecified trimester: Secondary | ICD-10-CM

## 2018-01-12 DIAGNOSIS — Z3A30 30 weeks gestation of pregnancy: Secondary | ICD-10-CM | POA: Insufficient documentation

## 2018-01-12 DIAGNOSIS — N949 Unspecified condition associated with female genital organs and menstrual cycle: Secondary | ICD-10-CM

## 2018-01-12 DIAGNOSIS — Z7982 Long term (current) use of aspirin: Secondary | ICD-10-CM | POA: Insufficient documentation

## 2018-01-12 DIAGNOSIS — Z3689 Encounter for other specified antenatal screening: Secondary | ICD-10-CM

## 2018-01-12 LAB — URINALYSIS, ROUTINE W REFLEX MICROSCOPIC
BILIRUBIN URINE: NEGATIVE
Glucose, UA: NEGATIVE mg/dL
HGB URINE DIPSTICK: NEGATIVE
Ketones, ur: NEGATIVE mg/dL
Leukocytes, UA: NEGATIVE
NITRITE: NEGATIVE
PH: 6 (ref 5.0–8.0)
Protein, ur: NEGATIVE mg/dL
Specific Gravity, Urine: 1.019 (ref 1.005–1.030)

## 2018-01-12 LAB — WET PREP, GENITAL
CLUE CELLS WET PREP: NONE SEEN
Sperm: NONE SEEN
Trich, Wet Prep: NONE SEEN
Yeast Wet Prep HPF POC: NONE SEEN

## 2018-01-12 MED ORDER — CYCLOBENZAPRINE HCL 10 MG PO TABS: 10.0000 mg | ORAL_TABLET | Freq: Two times a day (BID) | ORAL | 0 refills | Status: DC | PRN

## 2018-01-12 MED ORDER — ACETAMINOPHEN 500 MG PO TABS
1000.0000 mg | ORAL_TABLET | Freq: Once | ORAL | Status: AC
  Administered 2018-01-12: 1000 mg via ORAL
  Filled 2018-01-12: qty 2

## 2018-01-12 NOTE — MAU Note (Signed)
States pain started yesterday in lower abd and rates 8/10. States it feels like pressure. Reports a headache 7/10 denies vision changes. +FM. Denies bleeding and leaking.

## 2018-01-12 NOTE — MAU Note (Signed)
Pain/pressure in lower abd, comes and goes. No bleeding or leaking. Headache started yesterday- took Tylenol, no relief. Seeing flashes.  Denies epigastric pain or swelling.

## 2018-01-12 NOTE — Discharge Instructions (Signed)
Dolor del ligamento redondo (Round Ligament Pain) El ligamento redondo es un cordn de msculo y tejido que sirve de sostn para Nurse, learning disability. Puede volverse una fuente de dolor durante el embarazo si se distiende o se torsiona a medida que el beb crece. Generalmente, el dolor [employer] segundo trimestre de [place], y Audiological scientist y Office manager el momento del [place]. No se trata de un problema grave y no es perjudicial para el beb. El dolor del ligamento redondo suele ser agudo y punzante, y [place], pero tambin puede ser sordo, persistente y continuo. Se lo percibe en la regin inferior del abdomen o en la ingle. A menudo comienza en la zona ms profunda de la ingle y se extiende hacia regin externa de la cadera. El Mining engineer en los siguientes casos:  Al cambiar repentinamente de posicin.  Al darse vuelta en la cama.  Al toser o estornudar.  Al realizar actividad fsica. INSTRUCCIONES PARA EL CUIDADO EN EL HOGAR Controle su afeccin para ver si hay cambios. Siga estos pasos para Theatre stage manager dolor:  Cuando el dolor comience, reljese. Luego intente lo siguiente: ? Sentarse. ? Flexionar las rodillas hacia el abdomen. ? Acostarse de costado con una almohada debajo del abdomen y [employer] las piernas. ? Sentarse en una baera con agua tibia durante 15 a 77minutos o hasta que el dolor desaparezca.  Tome los medicamentos de venta libre y los recetados solamente como se lo haya indicado el mdico.  Haga movimientos lentos al sentarse y pararse.  No haga caminatas largas si le generan dolor.  Suspenda o reduzca las actividades fsicas si Firefighter. SOLICITE ATENCIN MDICA SI:  El dolor no desaparece con Dispensing optician.  Tiene un dolor en la espalda que no tena antes.  El medicamento no resulta eficaz. SOLICITE ATENCIN MDICA DE INMEDIATO SI:  Tiene escalofros o fiebre.  Tiene contracciones uterinas.  Presenta hemorragia  vaginal.  Siente nuseas o vmitos.  Tiene diarrea.  Siente dolor al [employer]. Esta informacin no tiene Marine scientist el consejo del mdico. Asegrese de hacerle al mdico cualquier pregunta que tenga. Document Released: 04/10/2008 Document Revised: 07/21/2011 Document Reviewed: 07/05/2014 Elsevier Interactive Patient Education  [employer].

## 2018-01-12 NOTE — MAU Note (Signed)
?   Left sided droop to eye and lips.  Unknown if new problem
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
## 2018-01-13 LAB — GC/CHLAMYDIA PROBE AMP (~~LOC~~) NOT AT ARMC
Chlamydia: NEGATIVE
Neisseria Gonorrhea: NEGATIVE

## 2018-01-14 ENCOUNTER — Ambulatory Visit (INDEPENDENT_AMBULATORY_CARE_PROVIDER_SITE_OTHER): Payer: Self-pay | Admitting: Obstetrics and Gynecology

## 2018-01-14 ENCOUNTER — Encounter: Payer: Self-pay | Admitting: Obstetrics and Gynecology

## 2018-01-14 VITALS — BP 108/64 | HR 92 | Wt 183.9 lb

## 2018-01-14 DIAGNOSIS — O09529 Supervision of elderly multigravida, unspecified trimester: Secondary | ICD-10-CM

## 2018-01-14 DIAGNOSIS — O0990 Supervision of high risk pregnancy, unspecified, unspecified trimester: Secondary | ICD-10-CM

## 2018-01-14 DIAGNOSIS — Z789 Other specified health status: Secondary | ICD-10-CM

## 2018-01-14 DIAGNOSIS — O09523 Supervision of elderly multigravida, third trimester: Secondary | ICD-10-CM

## 2018-01-14 DIAGNOSIS — O0993 Supervision of high risk pregnancy, unspecified, third trimester: Secondary | ICD-10-CM

## 2018-01-14 NOTE — Progress Notes (Signed)
Subjective:  Kari Ortega is a 37 y.o. [id number] at [redacted]w[redacted]d being seen today for ongoing prenatal care.  She is currently monitored for the following issues for this high-risk pregnancy and has HYPERTRIGLYCERIDEMIA; Supervision of high risk pregnancy, antepartum; AMA (advanced maternal age) multigravida 35+; BMI 30-39.9; Obesity in pregnancy; and Language barrier on their problem list.  Patient reports no complaints.  Contractions: Not present. Vag. Bleeding: None.  Movement: Present. Denies leaking of fluid.   The following portions of the patient's history were reviewed and updated as appropriate: allergies, current medications, past family history, past medical history, past social history, past surgical history and problem list. Problem list updated.  Objective:   Vitals:   01/14/18 1141  BP: 108/64  Pulse: 92  Weight: 183 lb 14.4 oz (83.4 kg)    Fetal Status: Fetal Heart Rate (bpm): 150   Movement: Present     General:  Alert, oriented and cooperative. Patient is in no acute distress.  Skin: Skin is warm and dry. No rash noted.   Cardiovascular: Normal heart rate noted  Respiratory: Normal respiratory effort, no problems with respiration noted  Abdomen: Soft, gravid, appropriate for gestational age. Pain/Pressure: Present     Pelvic:  Cervical exam deferred        Extremities: Normal range of motion.  Edema: Trace  Mental Status: Normal mood and affect. Normal behavior. Normal judgment and thought content.   Urinalysis:      Assessment and Plan:  Pregnancy: [id number] at [redacted]w[redacted]d  1. Supervision of high risk pregnancy, antepartum Stable  2. Antepartum multigravida of advanced maternal age 39 LR  3. Language barrier Interrupter services used today  Preterm labor symptoms and general obstetric precautions including but not limited to vaginal bleeding, contractions, leaking of fluid and fetal movement were reviewed in detail with the patient. Please refer to  After Visit Summary for other counseling recommendations.  Return in about 2 weeks (around 01/28/2018) for OB visit.   Chancy Milroy, MD

## 2018-02-01 ENCOUNTER — Ambulatory Visit (INDEPENDENT_AMBULATORY_CARE_PROVIDER_SITE_OTHER): Payer: Self-pay | Admitting: Family Medicine

## 2018-02-01 VITALS — BP 110/65 | HR 90 | Wt 188.0 lb

## 2018-02-01 DIAGNOSIS — R1013 Epigastric pain: Secondary | ICD-10-CM

## 2018-02-01 DIAGNOSIS — O09529 Supervision of elderly multigravida, unspecified trimester: Secondary | ICD-10-CM

## 2018-02-01 DIAGNOSIS — O0990 Supervision of high risk pregnancy, unspecified, unspecified trimester: Secondary | ICD-10-CM

## 2018-02-01 DIAGNOSIS — Z789 Other specified health status: Secondary | ICD-10-CM

## 2018-02-01 DIAGNOSIS — O99210 Obesity complicating pregnancy, unspecified trimester: Secondary | ICD-10-CM

## 2018-02-01 MED ORDER — RANITIDINE HCL 150 MG PO TABS: 150.0000 mg | ORAL_TABLET | Freq: Two times a day (BID) | ORAL | 2 refills | Status: DC

## 2018-02-01 NOTE — Progress Notes (Signed)
   PRENATAL VISIT NOTE  Subjective:  Kari Ortega is a 37 y.o. [id number] at [redacted]w[redacted]d being seen today for ongoing prenatal care.  She is currently monitored for the following issues for this high-risk pregnancy and has HYPERTRIGLYCERIDEMIA; Supervision of high risk pregnancy, antepartum; AMA (advanced maternal age) multigravida 35+; BMI 30-39.9; Obesity in pregnancy; and Language barrier on their problem list.  Patient reports lower pelvic pain.  Contractions: Not present. Vag. Bleeding: None.  Movement: Present. Denies leaking of fluid.   The following portions of the patient's history were reviewed and updated as appropriate: allergies, current medications, past family history, past medical history, past social history, past surgical history and problem list. Problem list updated.  Objective:   Vitals:   02/01/18 1329  BP: 110/65  Pulse: 90  Weight: 188 lb (85.3 kg)    Fetal Status: Fetal Heart Rate (bpm): 141 Fundal Height: 34 cm Movement: Present     General:  Alert, oriented and cooperative. Patient is in no acute distress.  Skin: Skin is warm and dry. No rash noted.   Cardiovascular: Normal heart rate noted  Respiratory: Normal respiratory effort, no problems with respiration noted  Abdomen: Soft, gravid, appropriate for gestational age.  Pain/Pressure: Present     Pelvic: Cervical exam deferred        Extremities: Normal range of motion.  Edema: Trace  Mental Status: Normal mood and affect. Normal behavior. Normal judgment and thought content.   Assessment and Plan:  Pregnancy: [id number] at [redacted]w[redacted]d  1. Supervision of high risk pregnancy, antepartum FHT and FH normal  2. Antepartum multigravida of advanced maternal age  74. Obesity in pregnancy  4. Language barrier Interpreter used  5. Abdominal pain, epigastric - ranitidine (ZANTAC) 150 MG tablet; Take 1 tablet (150 mg total) by mouth 2 (two) times daily.  Dispense: 30 tablet; Refill: 2  Preterm labor  symptoms and general obstetric precautions including but not limited to vaginal bleeding, contractions, leaking of fluid and fetal movement were reviewed in detail with the patient. Please refer to After Visit Summary for other counseling recommendations.  Return in about 2 weeks (around 02/15/2018) for HR OB f/u.  No future appointments.  Truett Mainland, DO

## 2018-02-15 ENCOUNTER — Ambulatory Visit (INDEPENDENT_AMBULATORY_CARE_PROVIDER_SITE_OTHER): Payer: Self-pay | Admitting: Obstetrics and Gynecology

## 2018-02-15 VITALS — BP 111/67 | HR 79 | Wt 190.0 lb

## 2018-02-15 DIAGNOSIS — O09523 Supervision of elderly multigravida, third trimester: Secondary | ICD-10-CM

## 2018-02-15 DIAGNOSIS — Z789 Other specified health status: Secondary | ICD-10-CM

## 2018-02-15 DIAGNOSIS — O0990 Supervision of high risk pregnancy, unspecified, unspecified trimester: Secondary | ICD-10-CM

## 2018-02-15 NOTE — Progress Notes (Signed)
Prenatal Visit Note Date: 02/15/2018 Clinic: [facility]  Subjective:  Kari Ortega is a 37 y.o. [id number] at [redacted]w[redacted]d being seen today for ongoing prenatal care.  She is currently monitored for the following issues for this high-risk pregnancy and has HYPERTRIGLYCERIDEMIA; Supervision of high risk pregnancy, antepartum; AMA (advanced maternal age) multigravida 35+; BMI 30-39.9; Obesity in pregnancy; and Language barrier on their problem list.  Patient reports no complaints.   Contractions: Irritability. Vag. Bleeding: None.  Movement: Present. Denies leaking of fluid.   The following portions of the patient's history were reviewed and updated as appropriate: allergies, current medications, past family history, past medical history, past social history, past surgical history and problem list. Problem list updated.  Objective:   Vitals:   02/15/18 0842  BP: 111/67  Pulse: 79  Weight: 190 lb (86.2 kg)    Fetal Status: Fetal Heart Rate (bpm): 155 Fundal Height: 35 cm Movement: Present  Presentation: Vertex  General:  Alert, oriented and cooperative. Patient is in no acute distress.  Skin: Skin is warm and dry. No rash noted.   Cardiovascular: Normal heart rate noted  Respiratory: Normal respiratory effort, no problems with respiration noted  Abdomen: Soft, gravid, appropriate for gestational age. Pain/Pressure: Present     Pelvic:  Cervical exam deferred        Extremities: Normal range of motion.  Edema: Trace  Mental Status: Normal mood and affect. Normal behavior. Normal judgment and thought content.   Urinalysis:      Assessment and Plan:  Pregnancy: B2W4132 at [redacted]w[redacted]d  1. Supervision of high risk pregnancy, antepartum Routine care. gbs nv  2. Multigravida of advanced maternal age in third trimester  3. Language barrier Interpreter used  Preterm labor symptoms and general obstetric precautions including but not limited to vaginal bleeding,  contractions, leaking of fluid and fetal movement were reviewed in detail with the patient. Please refer to After Visit Summary for other counseling recommendations.  Return in about 1 week (around 02/22/2018) for 7-10d hrob.   Aletha Halim, MD

## 2018-02-22 ENCOUNTER — Encounter: Payer: Self-pay | Admitting: Obstetrics and Gynecology

## 2018-02-22 ENCOUNTER — Ambulatory Visit (INDEPENDENT_AMBULATORY_CARE_PROVIDER_SITE_OTHER): Payer: Medicaid Other | Admitting: Obstetrics and Gynecology

## 2018-02-22 VITALS — BP 124/73 | HR 75 | Wt 189.8 lb

## 2018-02-22 DIAGNOSIS — Z789 Other specified health status: Secondary | ICD-10-CM

## 2018-02-22 DIAGNOSIS — O0990 Supervision of high risk pregnancy, unspecified, unspecified trimester: Secondary | ICD-10-CM

## 2018-02-22 DIAGNOSIS — Z113 Encounter for screening for infections with a predominantly sexual mode of transmission: Secondary | ICD-10-CM

## 2018-02-22 DIAGNOSIS — O09523 Supervision of elderly multigravida, third trimester: Secondary | ICD-10-CM

## 2018-02-22 NOTE — Progress Notes (Signed)
Subjective:  Kari Ortega is a 37 y.o. [id number] at [redacted]w[redacted]d being seen today for ongoing prenatal care.  She is currently monitored for the following issues for this high-risk pregnancy and has HYPERTRIGLYCERIDEMIA; Supervision of high risk pregnancy, antepartum; AMA (advanced maternal age) multigravida 35+; BMI 30-39.9; Obesity in pregnancy; and Language barrier on their problem list.  Patient reports general discomforts of pregnancy.  Contractions: Irritability. Vag. Bleeding: None.  Movement: Present. Denies leaking of fluid.   The following portions of the patient's history were reviewed and updated as appropriate: allergies, current medications, past family history, past medical history, past social history, past surgical history and problem list. Problem list updated.  Objective:   Vitals:   02/22/18 0850  BP: 124/73  Pulse: 75  Weight: 189 lb 12.8 oz (86.1 kg)    Fetal Status: Fetal Heart Rate (bpm): 132   Movement: Present     General:  Alert, oriented and cooperative. Patient is in no acute distress.  Skin: Skin is warm and dry. No rash noted.   Cardiovascular: Normal heart rate noted  Respiratory: Normal respiratory effort, no problems with respiration noted  Abdomen: Soft, gravid, appropriate for gestational age. Pain/Pressure: Present     Pelvic:  Cervical exam performed        Extremities: Normal range of motion.  Edema: Trace  Mental Status: Normal mood and affect. Normal behavior. Normal judgment and thought content.   Urinalysis:      Assessment and Plan:  Pregnancy: C5Y8502 at [redacted]w[redacted]d  1. Supervision of high risk pregnancy, antepartum Stable Labor precauition - Culture, beta strep (group b only) - GC/Chlamydia probe amp ([facility])not at [facility]  2. Multigravida of advanced maternal age in third trimester Panorama LR  3. Language barrier Interrupter services used today  Term labor symptoms and general obstetric precautions including but not  limited to vaginal bleeding, contractions, leaking of fluid and fetal movement were reviewed in detail with the patient. Please refer to After Visit Summary for other counseling recommendations.  Return in about 1 week (around 03/01/2018) for OB visit.   Chancy Milroy, MD

## 2018-02-22 NOTE — Progress Notes (Signed)
Technical brewer # [phone]

## 2018-02-22 NOTE — Patient Instructions (Addendum)
Parto vaginal Vaginal Delivery Parto vaginal significa que usted dar a luz empujando al beb fuera del canal del parto (vagina). Un equipo de proveedores de atencin mdica la ayudar antes, durante y despus del parto vaginal. Las experiencias de los nacimientos son nicas para todas las mujeres y cada embarazo y las experiencias de nacimiento varan segn dnde elija dar a luz. Qu debo hacer a fin de prepararme para el nacimiento de mi beb? Antes del nacimiento de su beb, es importante que hable con su mdico sobre lo siguiente:  Sus preferencias en cuanto al trabajo de parto y parto. Estas pueden incluir lo siguiente: ? Medicamentos que le puedan administrar. ? Cmo controlar el dolor. Esto podra incluir tcnicas de alivio del dolor no mdicas o medicamentos inyectables para aliviar el dolor como la analgesia epidural. ? Cmo se los controlar a usted y a su beb durante el trabajo de parto y el parto. ? Quin puede estar en la sala de trabajo de parto y parto con usted. ? Sus opiniones en cuanto al parto quirrgico de su beb (parto por cesrea o cesrea) si esto fuera necesario. ? Sus opiniones en cuanto a recibir sangre donada a travs de un tubo (catter) intravenoso (transfusin de sangre) si esto fuera necesario.  Si usted puede: ? Tomar fotografas o videos del nacimiento. ? Comer durante el trabajo de parto y el parto. ? Moverse, caminar o cambiar de posicin durante el trabajo de parto y el parto.  Qu esperar despus del nacimiento de su beb, como: ? Si se ofrece el pinzamiento y corte tardo del cordn umbilical. ? Quin cuidar a su beb inmediatamente despus del nacimiento. ? Medicamentos o pruebas que pueden recomendarse para su beb. ? Si su hospital o centro de parto apoya la lactancia. ? Cunto tiempo estar en el hospital o en el centro de parto.  Cmo cualquier afeccin mdica que usted tenga puede afectar a su beb o la experiencia de trabajo de parto y  parto.  A fin de prepararse para el nacimiento de su beb, usted tambin debe:  Asistir a todas sus visitas de atencin mdica antes del parto (visitas prenatales) segn lo recomendado por su mdico. Esto es importante.  Preparacin del hogar para la llegada de su beb. Asegrese de tener lo siguiente: ? Paales. ? Ropa de beb. ? Equipo de alimentacin. ? Haga preparativos para que usted y su beb puedan dormir de manera segura.  Instale un asiento de beb en su vehculo. Haga verificar el asiento de beb de su coche por un instalador de asientos de beb para asegurarse de que est instalado en forma segura.  Piense en quin la ayudar con su nuevo beb en el hogar durante al menos las primeras semanas despus del parto.  Qu puedo esperar cuando llegue al centro de parto o el hospital? Una vez que se inicie el trabajo de parto y haya sido admitida en el hospital o centro de parto, el mdico podr hacer lo siguiente:  Revisar sus antecedentes de embarazo y cualquier inquietud que usted pueda tener.  Colocar un tubo (catter) intravenoso en una de sus venas. Esto se usa para administrarle lquidos y medicamentos.  Verificar su presin arterial, temperatura y pulso y la frecuencia cardaca (signos vitales).  Verificar si la bolsa de agua (saco amnitico) se ha roto (ruptura).  Hablar con usted sobre su plan de nacimiento y analizar las opciones para controlar el dolor.  Monitoreo Su mdico puede monitorear las contracciones (monitoreo uterino) y   el ritmo cardaco del beb (monitoreo fetal). Es posible que el monitoreo se necesite realizar:  Con frecuencia, pero no continuamente (intermitentemente).  Todo el tiempo o durante largos perodos a la vez (continuamente). El monitoreo continuo puede ser necesario si: ? Usted est recibiendo determinados medicamentos, tales como medicamentos para aliviar el dolor o para hacer que las contracciones ms fuertes. ? Tiene complicaciones  durante el embarazo o el trabajo de parto.  El monitoreo se puede realizar:  Al colocar un estetoscopio especial o un dispositivo manual de monitoreo en el abdomen o verificar los latidos cardacos de su beb y sentir su abdomen para controlar de contracciones. Este mtodo de monitoreo no registra los latidos cardacos de su beb ni sus contracciones de manera continua.  Colocar monitores en el abdomen (monitores externos) para registrar los latidos cardacos de su beb y la frecuencia y duracin de las contracciones. No tendr que tener colocados los monitores externos en todo momento.  Colocar monitores dentro del tero (monitores internos) para registrar los latidos cardacos de su beb y la frecuencia, duracin y fuerza de sus contracciones. ? Su mdico podr usar monitores internos si necesita ms informacin sobre la fuerza de las contracciones o la frecuencia cardaca del beb. ? Los monitores internos se colocan pasando un cable delgado y flexible a travs de la vagina hasta el tero. Segn el tipo de monitor, puede permanecer en el tero o en la cabeza del beb hasta el nacimiento. ? Su mdico analizar con usted los beneficios y los riesgos de usar un monitor interno y le pedir permiso antes de colocar los monitores.  Telemetra. Se trata de un tipo de monitoreo continuo que se puede realizar con monitores externos o internos. En lugar de tener que permanecer en la cama, usted puede moverse durante la telemetra. Pregunte a su mdico si la telemetra es una opcin para usted.  Examen fsico. Su mdico puede realizarle un examen fsico. Esto puede incluir lo siguiente:  Verificar en qu posicin se encuentra su beb: ? Con la cabeza hacia la vagina (cabeza abajo). Esta es la posicin ms frecuente. ? Con la cabeza hacia la parte superior del tero (cabeza arriba o de nalgas). Si su beb est en una posicin de nalgas, su mdico puede tratar de hacerlo girar para que quede cabeza abajo a  fin de poder tener un parto vaginal. Si parece que su beb no puede nacer con parto vaginal, su mdico puede recomendar una ciruga para dar a luz al beb. En casos muy poco frecuentes, usted puede dar a luz con parto vaginal si el beb est cabeza arriba (parto de nalgas). ? Posicin lateral (transversal). Los bebs que estn en posicin lateral no pueden nacer por parto vaginal.  Verificar el cuello uterino para determinar: ? Si se est afinando o estirando (borrando). ? Si se est abriendo (dilatando). ? Cunto se ha movido o ha bajado su beb por el canal del parto.  Cules son las tres etapas del trabajo de parto y el parto?  El trabajo de parto y el parto normales se dividen en tres etapas: Etapa 1  La etapa 1 es la etapa ms larga del trabajo de parto y puede durar horas o das. La etapa 1 incluye: ? Trabajo de parto temprano. Esto es cuando las contracciones pueden ser irregulares o regulares y leves. En general, las contracciones del trabajo de parto temprano se producen con ms de 10 minutos de diferencia. ? Trabajo de parto activo. Esto es cuando las   contracciones son ms largas, ms regulares, ms frecuentes y ms intensas. ? La fase de transicin. Esto es cuando las contracciones ocurren muy seguidas, son muy intensas y pueden durar ms que durante cualquier otra parte del trabajo de parto.  En general, las contracciones son leves, infrecuentes e irregulares al principio. Se hacen ms fuertes, ms frecuentes (aproximadamente cada 2 o 3 minutos) y ms regulares a medida que usted avanza desde un trabajo de parto temprano hasta un trabajo de parto activo y fase de transicin.  Muchas mujeres progresan a travs de la etapa 1 de forma natural, pero es posible que usted necesite ayuda para continuar avanzando. Si esto ocurre, su mdico puede hablar con usted sobre lo siguiente: ? La ruptura del saco amnitico si es que an no ha ocurrido. ? Administracin de medicamentos para ayudarla  a tener contracciones ms fuertes y ms frecuentes.  La etapa 1 finaliza cuando el cuello uterino est completamente dilatado hasta 4 pulgadas (10cm) y completamente borrado. Esto ocurre al final de la fase de transicin. Etapa 2  Una vez que el cuello uterino est totalmente borrado y dilatado a 4 pulgadas (10cm), usted puede comenzar a sentir ganas de pujar. Es comn que el cuerpo tome un descanso de manera natural antes de sentir ganas de pujar, especialmente si recibe una epidural u otros medicamentos para el dolor. Este perodo de descanso puede durar un mximo de 1 a 2 horas, segn su experiencia de parto nica.  Durante la etapa 2, las contracciones son generalmente menos doloras porque pujar ayuda a aliviar el dolor de las contracciones. En lugar del dolor de las contracciones, puede sentir un dolor urente y por estiramiento, especialmente cuando la parte ms ancha de la cabeza de su beb pasa a travs de la abertura vaginal (coronacin).  Su mdico controlar atentamente su avance con los pujos y el avance del beb a travs de la vagina durante la etapa 2.  Su mdico puede masajear el rea de la piel entre la abertura vaginal y el ano (perineo) o aplicar compresas tibias en el perineo. Esto ayuda al estiramiento ya que la cabeza del beb empieza a aparecer, lo cual puede ayudar a evitar un desgarro perineal. ? En algunos casos, se puede hacer una incisin en el peritoneo (episiotoma) para permitir que el beb pase a travs de la abertura vaginal. La episiotoma sirve para agrandar la abertura vaginal a fin de que el beb tenga ms espacio para pasar durante el parto.  Es muy importante respirar y concentrarse para que el mdico pueda controlar la salida de la cabeza del beb. Es posible que su mdico tenga que disminuir la intensidad de los pujos para ayudar a evitar un desgarro perineal.  Despus de que sale la cabeza del beb, en general salen los hombros y el resto del cuerpo muy  rpidamente y sin dificultad.  Una vez que el beb nace, se debe cortar el cordn umbilical de inmediato o esto puede demorar 1 o 2 minutos, segn la salud del beb. Este procedimiento puede variar segn el mdico, el hospital y el centro de parto.  Si usted y su beb estn lo suficientemente sanos, se le colocar el beb en el pecho o el abdomen para ayudar a mantener la temperatura del beb y el vnculo entre ustedes. Algunas madres y bebs comienzan la lactancia en este momento. Su equipo mdico secar al beb y lo ayudar a mantenerse caliente durante este tiempo.  Es posible que su beb necesite atencin   inmediata si: ? Mostr signos de sufrimiento durante el trabajo de parto. ? Tiene una afeccin mdica. ? Naci antes de tiempo (prematuro). ? Defeca antes del nacimiento (meconio). ? Muestra signos de dificultar en la transicin de estar dentro del tero a estar fuera del tero. Si tiene planeado amamantar, su equipo mdico la ayudar a comenzar la lactancia. Etapa 3  La tercera etapa del trabajo de parto comienza inmediatamente despus del nacimiento de su beb y finaliza despus de la expulsin de la placenta. La placenta es un rgano de que desarrolla durante el embarazo para proporcionar oxgeno y nutrientes al beb en el tero.  La expulsin de la placenta puede requerir algunos pujos y es posible que usted tenga contracciones leves. La lactancia puede estimular las contracciones para ayudar a expulsar la placenta.  Luego de la expulsin de la placenta, el tero debe (contraerse) y quedar muy firme. Esto ayuda a detener el sangrado en el tero. Para ayudar al tero a contraerse y controlar el sangrado, su mdico puede: ? Administrarle un medicamento inyectable, a travs de un tubo (catter) intravenoso, por boca o a travs del recto (por va rectal). ? Masajear el abdomen o realizar un examen de la vagina para extraer cualquier cogulo de sangre que quede en el tero. ? Vaciar la  vejiga colocando un tubo flexible (catter) en la vejiga. ? Alentarla a amamantar a su beb. Una vez que termina el trabajo de parto, se los controlar a usted y a su beb atentamente para tener la seguridad de que ambos estn sanos hasta que estn listos para ir a casa. Su equipo de atencin mdica le ensear cmo cuidarse y cuidar a su beb. Esta informacin no tiene como fin reemplazar el consejo del mdico. Asegrese de hacerle al mdico cualquier pregunta que tenga. Document Released: 04/10/2008 Document Revised: 08/07/2016 Document Reviewed: 05/13/2015 Elsevier Interactive Patient Education  2018 [employer]vier Inc[employer]  

## 2018-02-23 LAB — GC/CHLAMYDIA PROBE AMP (~~LOC~~) NOT AT ARMC
Chlamydia: NEGATIVE
Neisseria Gonorrhea: NEGATIVE

## 2018-02-26 LAB — CULTURE, BETA STREP (GROUP B ONLY): STREP GP B CULTURE: POSITIVE — AB

## 2018-03-01 ENCOUNTER — Ambulatory Visit (INDEPENDENT_AMBULATORY_CARE_PROVIDER_SITE_OTHER): Payer: Self-pay | Admitting: Obstetrics & Gynecology

## 2018-03-01 VITALS — BP 115/69 | HR 78 | Wt 193.2 lb

## 2018-03-01 DIAGNOSIS — G510 Bell's palsy: Secondary | ICD-10-CM | POA: Insufficient documentation

## 2018-03-01 DIAGNOSIS — O99210 Obesity complicating pregnancy, unspecified trimester: Secondary | ICD-10-CM

## 2018-03-01 DIAGNOSIS — O0990 Supervision of high risk pregnancy, unspecified, unspecified trimester: Secondary | ICD-10-CM

## 2018-03-01 DIAGNOSIS — O09523 Supervision of elderly multigravida, third trimester: Secondary | ICD-10-CM

## 2018-03-01 DIAGNOSIS — Z789 Other specified health status: Secondary | ICD-10-CM

## 2018-03-01 NOTE — Progress Notes (Signed)
   PRENATAL VISIT NOTE  Subjective:  Kari Ortega is a 37 y.o. [id number] at [redacted]w[redacted]d being seen today for ongoing prenatal care.  She is currently monitored for the following issues for this high-risk pregnancy and has HYPERTRIGLYCERIDEMIA; Supervision of high risk pregnancy, antepartum; AMA (advanced maternal age) multigravida 35+; BMI 30-39.9; Obesity in pregnancy; and Language barrier on their problem list.  Patient reports pelvic pressure.  Contractions: Irregular. Vag. Bleeding: None.  Movement: Present. Denies leaking of fluid.   The following portions of the patient's history were reviewed and updated as appropriate: allergies, current medications, past family history, past medical history, past social history, past surgical history and problem list. Problem list updated.  Objective:   Vitals:   03/01/18 1512  BP: 115/69  Pulse: 78  Weight: 193 lb 3.2 oz (87.6 kg)    Fetal Status: Fetal Heart Rate (bpm): 150   Movement: Present     General:  Alert, oriented and cooperative. Patient is in no acute distress.  Skin: Skin is warm and dry. No rash noted.   Cardiovascular: Normal heart rate noted  Respiratory: Normal respiratory effort, no problems with respiration noted  Abdomen: Soft, gravid, appropriate for gestational age.  Pain/Pressure: Present     Pelvic: Cervical exam deferred        Extremities: Normal range of motion.  Edema: Trace  Mental Status: Normal mood and affect. Normal behavior. Normal judgment and thought content.   Assessment and Plan:  Pregnancy: V6H2094 at [redacted]w[redacted]d  1. Supervision of high risk pregnancy, antepartum  2. Obesity in pregnancy [place] for growth  S>D  3. Language barrier Spanish interpreter.   31. Multigravida of advanced maternal age in third trimester  Term labor symptoms and general obstetric precautions including but not limited to vaginal bleeding, contractions, leaking of fluid and fetal movement were reviewed in detail with the  patient. Please refer to After Visit Summary for other counseling recommendations.  Return in about 1 week (around 03/08/2018).  No future appointments.  Lavonia Drafts, MD

## 2018-03-03 ENCOUNTER — Encounter (HOSPITAL_COMMUNITY): Payer: Self-pay

## 2018-03-03 ENCOUNTER — Ambulatory Visit (HOSPITAL_COMMUNITY)
Admission: RE | Admit: 2018-03-03 | Discharge: 2018-03-03 | Disposition: A | Discharge status: Home / Self Care | Payer: Self-pay | Source: Ambulatory Visit | Attending: Obstetrics & Gynecology | Admitting: Obstetrics & Gynecology

## 2018-03-03 DIAGNOSIS — O0990 Supervision of high risk pregnancy, unspecified, unspecified trimester: Secondary | ICD-10-CM

## 2018-03-03 DIAGNOSIS — O99213 Obesity complicating pregnancy, third trimester: Secondary | ICD-10-CM | POA: Insufficient documentation

## 2018-03-03 DIAGNOSIS — O99210 Obesity complicating pregnancy, unspecified trimester: Secondary | ICD-10-CM

## 2018-03-03 DIAGNOSIS — O0993 Supervision of high risk pregnancy, unspecified, third trimester: Secondary | ICD-10-CM | POA: Insufficient documentation

## 2018-03-03 DIAGNOSIS — Z789 Other specified health status: Secondary | ICD-10-CM | POA: Insufficient documentation

## 2018-03-03 DIAGNOSIS — O09522 Supervision of elderly multigravida, second trimester: Secondary | ICD-10-CM

## 2018-03-03 DIAGNOSIS — O26843 Uterine size-date discrepancy, third trimester: Secondary | ICD-10-CM

## 2018-03-03 DIAGNOSIS — Z3A37 37 weeks gestation of pregnancy: Secondary | ICD-10-CM | POA: Insufficient documentation

## 2018-03-03 DIAGNOSIS — O09523 Supervision of elderly multigravida, third trimester: Secondary | ICD-10-CM | POA: Insufficient documentation

## 2018-03-03 DIAGNOSIS — O99212 Obesity complicating pregnancy, second trimester: Secondary | ICD-10-CM

## 2018-03-08 ENCOUNTER — Ambulatory Visit (INDEPENDENT_AMBULATORY_CARE_PROVIDER_SITE_OTHER): Payer: Self-pay | Admitting: Family Medicine

## 2018-03-08 VITALS — BP 135/78 | HR 86 | Wt 194.6 lb

## 2018-03-08 DIAGNOSIS — O99820 Streptococcus B carrier state complicating pregnancy: Secondary | ICD-10-CM

## 2018-03-08 DIAGNOSIS — O09523 Supervision of elderly multigravida, third trimester: Secondary | ICD-10-CM

## 2018-03-08 DIAGNOSIS — O0990 Supervision of high risk pregnancy, unspecified, unspecified trimester: Secondary | ICD-10-CM

## 2018-03-08 DIAGNOSIS — Z789 Other specified health status: Secondary | ICD-10-CM

## 2018-03-08 DIAGNOSIS — O99210 Obesity complicating pregnancy, unspecified trimester: Secondary | ICD-10-CM

## 2018-03-08 NOTE — Progress Notes (Signed)
   PRENATAL VISIT NOTE  Subjective:  Kari Ortega is a 37 y.o. [id number] at [redacted]w[redacted]d being seen today for ongoing prenatal care.  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  Patient reports pelvic pain, contractions.  Contractions: Irregular. Vag. Bleeding: None.  Movement: Present. Denies leaking of fluid.   The following portions of the patient's history were reviewed and updated as appropriate: allergies, current medications, past family history, past medical history, past social history, past surgical history and problem list. Problem list updated.  Objective:   Vitals:   03/08/18 1411  BP: 135/78  Pulse: 86  Weight: 194 lb 9.6 oz (88.3 kg)    Fetal Status: Fetal Heart Rate (bpm): 152 Fundal Height: 42 cm Movement: Present  Presentation: Vertex  General:  Alert, oriented and cooperative. Patient is in no acute distress.  Skin: Skin is warm and dry. No rash noted.   Cardiovascular: Normal heart rate noted  Respiratory: Normal respiratory effort, no problems with respiration noted  Abdomen: Soft, gravid, appropriate for gestational age.  Pain/Pressure: Present     Pelvic: Cervical exam performed Dilation: 2 Effacement (%): 50 Station: -3  Extremities: Normal range of motion.  Edema: Mild pitting, slight indentation  Mental Status: Normal mood and affect. Normal behavior. Normal judgment and thought content.   Assessment and Plan:  Pregnancy: [id number] at [redacted]w[redacted]d  1. Supervision of high risk pregnancy, antepartum FHT and FH normal  2. Obesity in pregnancy  3. Language barrier Interpreter used  4. Multigravida of advanced maternal age in third trimester  5. GBS (group B Streptococcus carrier), +RV culture,  currently pregnant Intrapartum prophylaxis  Term labor symptoms and general obstetric precautions including but not limited to vaginal bleeding, contractions, leaking of fluid and fetal movement were reviewed in detail with the patient. Please refer to After Visit Summary for other counseling recommendations.  Return in about 1 week (around 03/15/2018) for HR OB f/u.  Future Appointments  Date Time Provider [place]  03/15/2018  9:35 AM Truett Mainland, DO [facility] [place]  03/22/2018  3:55 PM Nehemiah Settle, Tanna Savoy, DO [facility] [place], DO

## 2018-03-08 NOTE — Progress Notes (Signed)
[place]

## 2018-03-08 NOTE — Addendum Note (Signed)
Addended by: Truett Mainland on: 03/08/2018 04:09 PM   Modules accepted: Orders, SmartSet

## 2018-03-09 ENCOUNTER — Encounter (HOSPITAL_COMMUNITY): Payer: Self-pay | Admitting: *Deleted

## 2018-03-09 ENCOUNTER — Telehealth (HOSPITAL_COMMUNITY): Payer: Self-pay | Admitting: *Deleted

## 2018-03-09 NOTE — Telephone Encounter (Signed)
[id number] interpreter number  Preadmission screen

## 2018-03-09 NOTE — Telephone Encounter (Signed)
Preadmission screen  

## 2018-03-10 ENCOUNTER — Encounter: Payer: Self-pay | Admitting: *Deleted

## 2018-03-15 ENCOUNTER — Ambulatory Visit (INDEPENDENT_AMBULATORY_CARE_PROVIDER_SITE_OTHER): Payer: Self-pay | Admitting: Family Medicine

## 2018-03-15 VITALS — BP 111/74 | HR 75 | Wt 194.7 lb

## 2018-03-15 DIAGNOSIS — O0990 Supervision of high risk pregnancy, unspecified, unspecified trimester: Secondary | ICD-10-CM

## 2018-03-15 DIAGNOSIS — O99820 Streptococcus B carrier state complicating pregnancy: Secondary | ICD-10-CM

## 2018-03-15 DIAGNOSIS — Z789 Other specified health status: Secondary | ICD-10-CM

## 2018-03-15 DIAGNOSIS — O09523 Supervision of elderly multigravida, third trimester: Secondary | ICD-10-CM

## 2018-03-15 NOTE — Progress Notes (Signed)
   PRENATAL VISIT NOTE  Subjective:  Kari Ortega is a 37 y.o. [id number] at [redacted]w[redacted]d being seen today for ongoing prenatal care.  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  Patient reports no complaints.  Contractions: Irritability. Vag. Bleeding: None.  Movement: Present. Denies leaking of fluid.   The following portions of the patient's history were reviewed and updated as appropriate: allergies, current medications, past family history, past medical history, past social history, past surgical history and problem list. Problem list updated.  Objective:   Vitals:   03/15/18 1023  BP: 111/74  Pulse: 75  Weight: 194 lb 11.2 oz (88.3 kg)    Fetal Status: Fetal Heart Rate (bpm): 150 Fundal Height: 44 cm Movement: Present  Presentation: Vertex  General:  Alert, oriented and cooperative. Patient is in no acute distress.  Skin: Skin is warm and dry. No rash noted.   Cardiovascular: Normal heart rate noted  Respiratory: Normal respiratory effort, no problems with respiration noted  Abdomen: Soft, gravid, appropriate for gestational age.  Pain/Pressure: Present     Pelvic: Cervical exam performed Dilation: 2 Effacement (%): 50 Station: -3  Extremities: Normal range of motion.  Edema: Mild pitting, slight indentation  Mental Status: Normal mood and affect. Normal behavior. Normal judgment and thought content.   Assessment and Plan:  Pregnancy: [id number] at [redacted]w[redacted]d  1. Supervision of high risk pregnancy, antepartum FHT normal  2. Multigravida of advanced maternal age in third trimester  3. Language barrier Interpreter used  4. GBS positive Intrapartum ppx  Term labor symptoms and general obstetric precautions  including but not limited to vaginal bleeding, contractions, leaking of fluid and fetal movement were reviewed in detail with the patient. Please refer to After Visit Summary for other counseling recommendations.  Return in about 5 weeks (around 04/19/2018) for Postpartum Exam.  Future Appointments  Date Time Provider [place]  03/22/2018  7:30 AM [facility]-BSSCHED ROOM [facility]-BSSCHED None    Truett Mainland, DO

## 2018-03-15 NOTE — Progress Notes (Signed)
[employer] Spanish Interpreter-Janina id# [phone]

## 2018-03-17 ENCOUNTER — Encounter (HOSPITAL_COMMUNITY): Payer: Self-pay

## 2018-03-17 ENCOUNTER — Inpatient Hospital Stay (HOSPITAL_COMMUNITY)
Admission: AD | Admit: 2018-03-17 | Discharge: 2018-03-17 | Disposition: A | Discharge status: Home / Self Care | Payer: Self-pay | Source: Ambulatory Visit | Attending: Obstetrics and Gynecology | Admitting: Obstetrics and Gynecology

## 2018-03-17 DIAGNOSIS — Z3A39 39 weeks gestation of pregnancy: Secondary | ICD-10-CM | POA: Insufficient documentation

## 2018-03-17 DIAGNOSIS — O0990 Supervision of high risk pregnancy, unspecified, unspecified trimester: Secondary | ICD-10-CM

## 2018-03-17 DIAGNOSIS — O471 False labor at or after 37 completed weeks of gestation: Secondary | ICD-10-CM | POA: Insufficient documentation

## 2018-03-17 NOTE — MAU Note (Signed)
I have communicated with Marcille Buffy CNM and reviewed vital signs:  Vitals:   03/17/18 0933 03/17/18 1100  BP: 130/75 122/72  Pulse: 79 81  Resp: 18     Vaginal exam:  Dilation: 3.5 Effacement (%): 60 Cervical Position: Middle Station: -3 Presentation: Vertex Exam by:: Wilhemena Durie RN,   Also reviewed contraction pattern and that non-stress test is reactive.  It has been documented that patient is contracting every 3-6 minutes with no cervical change over 1 hour not indicating active labor.  Patient denies any other complaints.  Based on this report provider has given order for discharge.  A discharge order and diagnosis entered by a provider.   Labor discharge instructions reviewed with patient.

## 2018-03-18 ENCOUNTER — Other Ambulatory Visit: Payer: Self-pay

## 2018-03-18 ENCOUNTER — Encounter (HOSPITAL_COMMUNITY): Payer: Self-pay | Admitting: *Deleted

## 2018-03-18 ENCOUNTER — Inpatient Hospital Stay (HOSPITAL_COMMUNITY)
Admission: AD | Admit: 2018-03-18 | Discharge: 2018-03-20 | DRG: 807 | Disposition: A | Discharge status: Home / Self Care | Payer: Medicaid Other | Attending: Obstetrics & Gynecology | Admitting: Obstetrics & Gynecology

## 2018-03-18 DIAGNOSIS — Z789 Other specified health status: Secondary | ICD-10-CM | POA: Diagnosis present

## 2018-03-18 DIAGNOSIS — Z758 Other problems related to medical facilities and other health care: Secondary | ICD-10-CM | POA: Diagnosis present

## 2018-03-18 DIAGNOSIS — O99354 Diseases of the nervous system complicating childbirth: Secondary | ICD-10-CM | POA: Diagnosis present

## 2018-03-18 DIAGNOSIS — O9902 Anemia complicating childbirth: Secondary | ICD-10-CM | POA: Diagnosis present

## 2018-03-18 DIAGNOSIS — D649 Anemia, unspecified: Secondary | ICD-10-CM | POA: Diagnosis present

## 2018-03-18 DIAGNOSIS — E669 Obesity, unspecified: Secondary | ICD-10-CM | POA: Diagnosis present

## 2018-03-18 DIAGNOSIS — O99824 Streptococcus B carrier state complicating childbirth: Secondary | ICD-10-CM | POA: Diagnosis present

## 2018-03-18 DIAGNOSIS — Z3483 Encounter for supervision of other normal pregnancy, third trimester: Secondary | ICD-10-CM | POA: Diagnosis present

## 2018-03-18 DIAGNOSIS — E781 Pure hyperglyceridemia: Secondary | ICD-10-CM | POA: Diagnosis present

## 2018-03-18 DIAGNOSIS — G839 Paralytic syndrome, unspecified: Secondary | ICD-10-CM | POA: Diagnosis present

## 2018-03-18 DIAGNOSIS — O0990 Supervision of high risk pregnancy, unspecified, unspecified trimester: Secondary | ICD-10-CM

## 2018-03-18 DIAGNOSIS — Z3A39 39 weeks gestation of pregnancy: Secondary | ICD-10-CM

## 2018-03-18 DIAGNOSIS — O09529 Supervision of elderly multigravida, unspecified trimester: Secondary | ICD-10-CM

## 2018-03-18 DIAGNOSIS — O99214 Obesity complicating childbirth: Secondary | ICD-10-CM | POA: Diagnosis present

## 2018-03-18 DIAGNOSIS — G510 Bell's palsy: Secondary | ICD-10-CM

## 2018-03-18 DIAGNOSIS — O99210 Obesity complicating pregnancy, unspecified trimester: Secondary | ICD-10-CM | POA: Diagnosis present

## 2018-03-18 LAB — COMPREHENSIVE METABOLIC PANEL
ALT: 30 U/L (ref 0–44)
AST: 50 U/L — ABNORMAL HIGH (ref 15–41)
Albumin: 2.9 g/dL — ABNORMAL LOW (ref 3.5–5.0)
Alkaline Phosphatase: 224 U/L — ABNORMAL HIGH (ref 38–126)
Anion gap: 10 (ref 5–15)
BUN: 11 mg/dL (ref 6–20)
CO2: 18 mmol/L — ABNORMAL LOW (ref 22–32)
Calcium: 8.4 mg/dL — ABNORMAL LOW (ref 8.9–10.3)
Chloride: 106 mmol/L (ref 98–111)
Creatinine, Ser: 0.49 mg/dL (ref 0.44–1.00)
GFR calc Af Amer: >60 mL/min (ref >60)
GFR calc non Af Amer: >60 mL/min (ref >60)
Glucose, Bld: 97 mg/dL (ref 70–99)
Potassium: 4.4 mmol/L (ref 3.5–5.1)
Sodium: 134 mmol/L — ABNORMAL LOW (ref 135–145)
Total Bilirubin: 0.7 mg/dL (ref 0.3–1.2)
Total Protein: 6.8 g/dL (ref 6.5–8.1)

## 2018-03-18 LAB — TYPE AND SCREEN
ABO/RH(D): O POS
Antibody Screen: NEGATIVE

## 2018-03-18 LAB — URINALYSIS, ROUTINE W REFLEX MICROSCOPIC
Bilirubin Urine: NEGATIVE
GLUCOSE, UA: NEGATIVE mg/dL
HGB URINE DIPSTICK: NEGATIVE
Ketones, ur: NEGATIVE mg/dL
LEUKOCYTES UA: NEGATIVE
Nitrite: NEGATIVE
PROTEIN: NEGATIVE mg/dL
Specific Gravity, Urine: 1.012 (ref 1.005–1.030)
pH: 6 (ref 5.0–8.0)

## 2018-03-18 LAB — CBC
HCT: 36.1 % (ref 36.0–46.0)
Hemoglobin: 11.9 g/dL — ABNORMAL LOW (ref 12.0–15.0)
MCH: 28.3 pg (ref 26.0–34.0)
MCHC: 33 g/dL (ref 30.0–36.0)
MCV: 85.7 fL (ref 80.0–100.0)
PLATELETS: 364 10*3/uL (ref 150–400)
RBC: 4.21 MIL/uL (ref 3.87–5.11)
RDW: 16.1 % — AB (ref 11.5–15.5)
WBC: 13.2 10*3/uL — ABNORMAL HIGH (ref 4.0–10.5)
nRBC: 0 % (ref 0.0–0.2)

## 2018-03-18 LAB — PROTEIN / CREATININE RATIO, URINE
Creatinine, Urine: 56 mg/dL
Protein Creatinine Ratio: 0.34 mg/mg{Cre} — ABNORMAL HIGH (ref 0.00–0.15)
TOTAL PROTEIN, URINE: 19 mg/dL

## 2018-03-18 MED ORDER — ACETAMINOPHEN 325 MG PO TABS: 650.0000 mg | ORAL_TABLET | ORAL | Status: DC | PRN

## 2018-03-18 MED ORDER — FLEET ENEMA 7-19 GM/118ML RE ENEM: 1.0000 | ENEMA | RECTAL | Status: DC | PRN

## 2018-03-18 MED ORDER — LACTATED RINGERS IV SOLN: 500.0000 mL | INTRAVENOUS | Status: DC | PRN

## 2018-03-18 MED ORDER — ONDANSETRON HCL 4 MG PO TABS: 4.0000 mg | ORAL_TABLET | ORAL | Status: DC | PRN

## 2018-03-18 MED ORDER — TETANUS-DIPHTH-ACELL PERTUSSIS 5-2.5-18.5 LF-MCG/0.5 IM SUSP: 0.5000 mL | Freq: Once | INTRAMUSCULAR | Status: DC

## 2018-03-18 MED ORDER — ZOLPIDEM TARTRATE 5 MG PO TABS: 5.0000 mg | ORAL_TABLET | Freq: Every evening | ORAL | Status: DC | PRN

## 2018-03-18 MED ORDER — METHYLERGONOVINE MALEATE 0.2 MG/ML IJ SOLN
0.2000 mg | Freq: Once | INTRAMUSCULAR | Status: AC
  Administered 2018-03-18: 0.2 mg via INTRAMUSCULAR
  Filled 2018-03-18: qty 1

## 2018-03-18 MED ORDER — LIDOCAINE HCL (PF) 1 % IJ SOLN
30.0000 mL | INTRAMUSCULAR | Status: DC | PRN
  Filled 2018-03-18: qty 30

## 2018-03-18 MED ORDER — LACTATED RINGERS IV SOLN
INTRAVENOUS | Status: DC
  Administered 2018-03-18: 09:00:00 via INTRAVENOUS

## 2018-03-18 MED ORDER — OXYCODONE-ACETAMINOPHEN 5-325 MG PO TABS: 2.0000 | ORAL_TABLET | ORAL | Status: DC | PRN

## 2018-03-18 MED ORDER — OXYCODONE-ACETAMINOPHEN 5-325 MG PO TABS
1.0000 | ORAL_TABLET | ORAL | Status: DC | PRN
  Administered 2018-03-18: 1 via ORAL
  Filled 2018-03-18: qty 1

## 2018-03-18 MED ORDER — DIBUCAINE 1 % RE OINT: 1.0000 "application " | TOPICAL_OINTMENT | RECTAL | Status: DC | PRN

## 2018-03-18 MED ORDER — WITCH HAZEL-GLYCERIN EX PADS: 1.0000 "application " | MEDICATED_PAD | CUTANEOUS | Status: DC | PRN

## 2018-03-18 MED ORDER — OXYTOCIN BOLUS FROM INFUSION
500.0000 mL | Freq: Once | INTRAVENOUS | Status: AC
  Administered 2018-03-18: 500 mL via INTRAVENOUS

## 2018-03-18 MED ORDER — OXYTOCIN 40 UNITS IN LACTATED RINGERS INFUSION - SIMPLE MED
2.5000 [IU]/h | INTRAVENOUS | Status: DC
  Filled 2018-03-18: qty 1000

## 2018-03-18 MED ORDER — SENNOSIDES-DOCUSATE SODIUM 8.6-50 MG PO TABS
2.0000 | ORAL_TABLET | ORAL | Status: DC
  Administered 2018-03-19 (×2): 2 via ORAL
  Filled 2018-03-18 (×2): qty 2

## 2018-03-18 MED ORDER — BENZOCAINE-MENTHOL 20-0.5 % EX AERO
1.0000 "application " | INHALATION_SPRAY | CUTANEOUS | Status: DC | PRN
  Administered 2018-03-19: 1 via TOPICAL
  Filled 2018-03-18: qty 56

## 2018-03-18 MED ORDER — SOD CITRATE-CITRIC ACID 500-334 MG/5ML PO SOLN: 30.0000 mL | ORAL | Status: DC | PRN

## 2018-03-18 MED ORDER — SODIUM CHLORIDE 0.9 % IV SOLN
2.0000 g | Freq: Once | INTRAVENOUS | Status: AC
  Administered 2018-03-18: 2 g via INTRAVENOUS
  Filled 2018-03-18: qty 2

## 2018-03-18 MED ORDER — SIMETHICONE 80 MG PO CHEW: 80.0000 mg | CHEWABLE_TABLET | ORAL | Status: DC | PRN

## 2018-03-18 MED ORDER — IBUPROFEN 600 MG PO TABS
600.0000 mg | ORAL_TABLET | Freq: Four times a day (QID) | ORAL | Status: DC
  Administered 2018-03-18 – 2018-03-20 (×8): 600 mg via ORAL
  Filled 2018-03-18 (×8): qty 1

## 2018-03-18 MED ORDER — FENTANYL CITRATE (PF) 100 MCG/2ML IJ SOLN
100.0000 ug | INTRAMUSCULAR | Status: DC | PRN
  Administered 2018-03-18: 100 ug via INTRAVENOUS
  Filled 2018-03-18: qty 2

## 2018-03-18 MED ORDER — PRENATAL MULTIVITAMIN CH
1.0000 | ORAL_TABLET | Freq: Every day | ORAL | Status: DC
  Administered 2018-03-19 – 2018-03-20 (×2): 1 via ORAL
  Filled 2018-03-18 (×2): qty 1

## 2018-03-18 MED ORDER — COCONUT OIL OIL
1.0000 "application " | TOPICAL_OIL | Status: DC | PRN
  Administered 2018-03-20: 1 via TOPICAL
  Filled 2018-03-18: qty 120

## 2018-03-18 MED ORDER — ACETAMINOPHEN 325 MG PO TABS
650.0000 mg | ORAL_TABLET | ORAL | Status: DC | PRN
  Administered 2018-03-19: 650 mg via ORAL
  Filled 2018-03-18: qty 2

## 2018-03-18 MED ORDER — DIPHENHYDRAMINE HCL 25 MG PO CAPS: 25.0000 mg | ORAL_CAPSULE | Freq: Four times a day (QID) | ORAL | Status: DC | PRN

## 2018-03-18 MED ORDER — ONDANSETRON HCL 4 MG/2ML IJ SOLN: 4.0000 mg | INTRAMUSCULAR | Status: DC | PRN

## 2018-03-18 MED ORDER — ONDANSETRON HCL 4 MG/2ML IJ SOLN: 4.0000 mg | Freq: Four times a day (QID) | INTRAMUSCULAR | Status: DC | PRN
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
## 2018-03-18 NOTE — Progress Notes (Signed)
   Kari Ortega is a 37 y.o. [id number] at [redacted]w[redacted]d  admitted for active labor  Subjective:  Feeling pain; lying in bed and breathing through contractions. Does not want any pain medicines. No S/S of pre-e.   Objective: Vitals:   03/18/18 0826 03/18/18 0911 03/18/18 0958 03/18/18 1050  BP: 132/85 133/83 (!) 143/88 (!) 146/78  Pulse: 87 79 80 76  Resp: 20 18 18 18   Temp: 98.1 F (36.7 C) 98.6 F (37 C)    TempSrc: Oral Oral    SpO2: 99%     Weight: 87.7 kg     Height: 4' 9.09" (1.45 m)      No intake/output data recorded.  FHT:  FHR: 135 bpm, variability: moderate,  accelerations:  Present,  decelerations:  Absent UC:   irregular, every 140 minutes SVE:   Dilation: 8 Effacement (%): 100 Station: -2 Exam by:: Harlow Ohms CNM    Labs: Lab Results  Component Value Date   WBC 13.2 (H) 03/18/2018   HGB 11.9 (L) 03/18/2018   HCT 36.1 03/18/2018   MCV 85.7 03/18/2018   PLT 364 03/18/2018    Assessment / Plan: Spontaneous labor, progressing normally AROM with small amount of clear fluid.  ALT elevated but stable from one month ago; platelets normal. PCR pending.  BP has been occasionally elevated.  Labor: Progressing normally Fetal Wellbeing:  Category I Pain Control:  Labor support without medications Anticipated MOD:  NSVD  [place] 03/18/2018, 11:39 AM

## 2018-03-18 NOTE — Progress Notes (Addendum)
Called to bedside for continued bleeding and passing of clots since patient delivered. Total EBL since delivery has been 578cc. Patient has had two large voids since delivery. She denies lightheadedness, dizziness and is otherwise feeling well. She did receive a bolus of pitocin after delivery of the baby.   Vitals:   03/18/18 1512 03/18/18 1608  BP: 128/77 130/75  Pulse: 80 80  Resp: 20   Temp: 97.8 F (36.6 C)   SpO2:     Physical Exam:  General: well appearing, no distress Resp: easy WOB Abdom: uterine fundus firm but 2-3cm above the level of the umbilicus with the R side more prominent SVE: no clots in the vaginal vault  Plan: Ordered Methergine 0.2mg  IM once.  Continue to monitor vitals.  Recheck CBC in the AM.   I confirm that I have verified the information documented in the resident's note and that I have also personally reperformed the physical exam and all medical decision making activities.   Maye Hides

## 2018-03-18 NOTE — Progress Notes (Signed)
OB/[facility]N Faculty [facility]: Labor Progress Note  Subjective: Doing well. No vaginal bleeding or LOF.   Objective: BP (!) 146/78   Pulse 76   Temp 98.6 F (37 C) (Oral)   Resp 18   Ht 4' 9.09" (1.45 m)   Wt 87.7 kg   LMP 06/15/2017 (Exact Date)   SpO2 99%   BMI 41.69 kg/m  Gen: Mild distress  Dilation: 8 Effacement (%): 100 Cervical Position: Middle Station: -2 Presentation: Vertex Exam by:: Daneil Dan CNM   Assessment and Plan: 37 y.o. [id number] [redacted]w[redacted]d presenting with SOL.   Labor: AROM at 1120 -- pain control: IV fentanyl -- PPH risk: low   Elevated Bps: moderate range blood pressures -- AST mildly elevated at 50 but unchanged from 85mo ago, ALT normal, platelets normal  -- UPC pending  Fetal Well-Being: cephalic, [place] 45/85 Est. FW: 3491gm(89 %tile) - Cat 1 strip - continuous fetal monitoring  - GBS (+) - s/p Ampicillin   Orlene Plum, MD Family Medicine Resident 11:30 AM

## 2018-03-18 NOTE — MAU Note (Signed)
Presents with c/o ctxs that began 2 days ago.  Reports ctxs are 5-7 minutes apart.  Denies LOF or VB.  Reports +FM.

## 2018-03-19 LAB — CBC
HEMATOCRIT: 28.3 % — AB (ref 36.0–46.0)
HEMOGLOBIN: 9.2 g/dL — AB (ref 12.0–15.0)
MCH: 27.8 pg (ref 26.0–34.0)
MCHC: 32.5 g/dL (ref 30.0–36.0)
MCV: 85.5 fL (ref 80.0–100.0)
Platelets: 289 10*3/uL (ref 150–400)
RBC: 3.31 MIL/uL — ABNORMAL LOW (ref 3.87–5.11)
RDW: 16.2 % — ABNORMAL HIGH (ref 11.5–15.5)
WBC: 13.4 10*3/uL — ABNORMAL HIGH (ref 4.0–10.5)
nRBC: 0.2 % (ref 0.0–0.2)

## 2018-03-19 LAB — RPR: RPR: NONREACTIVE

## 2018-03-19 NOTE — Progress Notes (Signed)
Pt was offered intrepreter services each time RN entered the room throughout the night but the pt declined needing to use it. In one instant, the RN already had the interpreter pulled up with Interpreter [phone] but the screen froze. At this time the pt reported she did not want to use the interpreter and stated "I can comprehend". The pt was able to speak limited English and reported understanding the RN.

## 2018-03-19 NOTE — Progress Notes (Signed)
Post Partum Day 1 Subjective: up ad lib, voiding, tolerating PO, + flatus and complains of crampy abdominal pain   Objective: Blood pressure 110/67, pulse 76, temperature 97.9 F (36.6 C), temperature source Oral, resp. rate 16, height 4' 9.09" (1.45 m), weight 87.7 kg, last menstrual period 06/15/2017, SpO2 99 %, unknown if currently breastfeeding.  Physical Exam:  General: alert, cooperative, appears stated age, no distress and facial asymmetry  Lochia: appropriate Uterine Fundus: firm Incision: NA DVT Evaluation: No evidence of DVT seen on physical exam.  Recent Labs    03/18/18 0910 03/19/18 0522  HGB 11.9* 9.2*  HCT 36.1 28.3*    Assessment/Plan: Plan for discharge tomorrow, Breastfeeding and Lactation consult   LOS: 1 day   Kari Ortega 03/19/2018, 8:08 AM

## 2018-03-19 NOTE — Lactation Note (Signed)
This note was copied from a baby's chart. Lactation Consultation Note  Patient Name: Kari Ortega [id number] Date: 03/19/2018 Reason for consult: Initial assessment;Term P3, 35 hour female infant Per mom previously BF other children for one and three years. Infant has elevated billi level currently will have repeat serum in morning.  Mom's feeding choice upon admission and currently is Breastfeeding and supplementing with formula. Per mom, she BF infant 20 mins. Prior LC entering room and been supplement with formula after BF. LC did not  Mom feels she doesn't have breast milk, LC ask mom hand expression and mom saw that colostrum is present.  Per mom, husband help her use hand pump earlier today  and did not see any milk she is glad she  starting to see milk present  now. [place] set up DEBP explained how to assemble, clean, store and use.  LC discussed I & O. Mom made aware of O/P services, breastfeeding support groups, community resources, and our phone # for post-discharge questions.   Mom's current plan: 1. BF according hunger cues, 8 to 12 times within 24 hours, including nights. 2. Mom will supplement with formula after BF according infant's age/ hours. 3. Mom plans to use DEBP and give infant back any EBM before giving formula supplement. 4. Mom will pump 5 to 6 times within 24 hours. Maternal Data Formula Feeding for Exclusion: No Has patient been taught Hand Expression?: Yes(Mom expressed colostrum from both breast.) Does the patient have breastfeeding experience prior to this delivery?: Yes  Feeding Feeding Type: Breast Fed  LATCH Score                   Interventions Interventions: Breast massage;DEBP  Lactation Tools Discussed/Used WIC Program: Yes Pump Review: Setup, frequency, and cleaning;Milk Storage Initiated by:: Vicente Serene, IBCLC Date initiated:: 03/20/18   Consult Status Consult Status: Follow-up Date: 03/19/18 Follow-up type:  In-patient    Vicente Serene 03/19/2018, 11:33 PM

## 2018-03-20 MED ORDER — FERROUS SULFATE 325 (65 FE) MG PO TABS: 325.0000 mg | ORAL_TABLET | Freq: Every day | ORAL | 0 refills | Status: DC

## 2018-03-20 MED ORDER — SENNOSIDES-DOCUSATE SODIUM 8.6-50 MG PO TABS: 2.0000 | ORAL_TABLET | ORAL | 0 refills | Status: DC

## 2018-03-20 MED ORDER — IBUPROFEN 600 MG PO TABS: 600.0000 mg | ORAL_TABLET | Freq: Four times a day (QID) | ORAL | 0 refills | Status: DC

## 2018-03-20 NOTE — Lactation Note (Signed)
This note was copied from a baby's chart. Lactation Consultation Note  Patient Name: Kari Ortega TYVDP'[id number] Date: 03/20/2018 Reason for consult: Follow-up assessment;Term;Infant weight loss  47 hours FT female who is being partially BF and formula fed by his mother, that was her feeding choice upon admission. Mom and baby are going home today. Mom complained of some breast pain when assisting with hand expression, she voiced she's been using the DEBP to completely empty her breasts (since baby hasn't been able to do it) but there's a lot of friction and rubbing. Asker Rise Paganini her RN for some coconut oil prior her discharge and instructed mo how to use it prior pumping.  LC showed mom how to convert her DEBP kit into a hand pump and also reviewed her manual pump instructions. She'll continue taking baby to the breast on cues 8-12 times/24 hours or sooner if feeding cues are present and will pump afterwards to completely empty her breasts, her milk is coming in, her left breast felt hard and it was the most sensitive one upon compressions.  Reviewed discharge instructions, prevention and treatment for sore nipples as well as engorgement prevention and treatment. Mom understands that formula is just to be used for supplementation purposes and not to completely replace/skip feedings. Mom and dad are both aware of [place] OP services and will contact PRN.  Maternal Data Formula Feeding for Exclusion: Yes Reason for exclusion: Mother's choice to formula and breast feed on admission  Feeding   Interventions Interventions: Breast feeding basics reviewed;Breast massage;Hand express;Breast compression;DEBP;Hand pump;Coconut oil  Lactation Tools Discussed/Used Tools: Pump;Coconut oil Breast pump type: Double-Electric Breast Pump;Manual   Consult Status Consult Status: Complete Date: 03/20/18 Follow-up type: Call as needed    [place] 03/20/2018, 11:35 AM

## 2018-03-20 NOTE — Discharge Summary (Addendum)
OB Discharge Summary    Patient Name: Kari Ortega DOB: 10/15/1980 MRN: [id number]  Date of admission: 03/18/2018 Delivering MD: Orlene Plum A   Date of discharge: 03/20/2018  Admitting diagnosis: 40WKS PAIN Intrauterine pregnancy: [redacted]w[redacted]d     Secondary diagnosis:  Active Problems:   HYPERTRIGLYCERIDEMIA   Supervision of high risk pregnancy, antepartum   AMA (advanced maternal age) multigravida 35+   Obesity in pregnancy   Language barrier   Paralysis of the face   Normal labor  Additional problems: GBS positive (received Ampicillin), anemia (9.2)      Discharge diagnosis: Term Pregnancy Delivered and Anemia                                                                                                Post partum procedures:None   Augmentation: AROM  Complications: None  Hospital course:  Onset of Labor With Vaginal Delivery     37 y.o. yo [id number] at [redacted]w[redacted]d was admitted in Active Labor on 03/18/2018. Patient had an uncomplicated labor course as follows:  Membrane Rupture Time/Date: 11:16 AM ,03/18/2018   Intrapartum Procedures: Episiotomy: None [1]                                         Lacerations:  None [1]  Patient had a delivery of a Viable infant. 03/18/2018  Information for the patient's newborn:  Merridith, Dershem [[id number]]  Delivery Method: Vag-Spont    Pateint had an uncomplicated postpartum course.  She is ambulating, tolerating a regular diet, passing flatus, and urinating well. Patient is discharged home in stable condition on 03/20/18.   Physical exam  Vitals:   03/19/18 0100 03/19/18 0530 03/19/18 2332 03/20/18 0549  BP: 106/71 110/67 115/77 (!) 104/59  Pulse: 86 76 87 85  Resp: 18 16 18 18   Temp: 98.1 F (36.7 C) 97.9 F (36.6 C) 98.4 F (36.9 C) 98.2 F (36.8 C)  TempSrc:  Oral Oral Oral  SpO2: 100% 99%  100%  Weight:      Height:       General: alert, cooperative and no distress Lochia: appropriate Uterine Fundus:  firm Incision: N/A DVT Evaluation: No evidence of DVT seen on physical exam. No significant calf/ankle edema. Labs: Lab Results  Component Value Date   WBC 13.4 (H) 03/19/2018   HGB 9.2 (L) 03/19/2018   HCT 28.3 (L) 03/19/2018   MCV 85.5 03/19/2018   PLT 289 03/19/2018   CMP Latest Ref Rng & Units 03/18/2018  Glucose 70 - 99 mg/dL 97  BUN 6 - 20 mg/dL 11  Creatinine 0.44 - 1.00 mg/dL 0.49  Sodium 135 - 145 mmol/L 134(L)  Potassium 3.5 - 5.1 mmol/L 4.4  Chloride 98 - 111 mmol/L 106  CO2 22 - 32 mmol/L 18(L)  Calcium 8.9 - 10.3 mg/dL 8.4(L)  Total Protein 6.5 - 8.1 g/dL 6.8  Total Bilirubin 0.3 - 1.2 mg/dL 0.7  Alkaline Phos 38 - 126 U/L 224(H)  AST 15 - 41 U/L 50(H)  ALT 0 - 44 U/L 30    Discharge instruction: per After Visit Summary and "Baby and Me Booklet".  After visit meds:  Allergies as of 03/20/2018   No Known Allergies     Medication List    STOP taking these medications   aspirin EC 81 MG tablet     TAKE these medications   cyclobenzaprine 10 MG tablet Commonly known as:  FLEXERIL Take 1 tablet (10 mg total) by mouth 2 (two) times daily as needed for muscle spasms.   famotidine 20 MG tablet Commonly known as:  PEPCID Take 1 tablet (20 mg total) by mouth 2 (two) times daily.   ferrous sulfate 325 (65 FE) MG tablet Take 1 tablet (325 mg total) by mouth daily.   ibuprofen 600 MG tablet Commonly known as:  ADVIL,MOTRIN Take 1 tablet (600 mg total) by mouth every 6 (six) hours.   multivitamin-prenatal 27-0.8 MG Tabs tablet Take 1 tablet by mouth daily at 12 noon.   ranitidine 150 MG tablet Commonly known as:  ZANTAC Take 150 mg by mouth daily as needed for heartburn.   senna-docusate 8.6-50 MG tablet Commonly known as:  Senokot-S Take 2 tablets by mouth daily. Start taking on:  03/21/2018       Diet: routine diet  Activity: Advance as tolerated. Pelvic rest for 6 weeks.   Outpatient follow up:4 weeks Follow up Appt:No future  appointments. Follow up Visit:No follow-ups on file.  Postpartum contraception: Condoms, aware of additional birth control options, declines at this time.   Newborn Data: Live born female  Birth Weight: 7 lb 0.9 oz (3200 g) APGAR: 9, 9  Newborn Delivery   Birth date/time:  03/18/2018 12:33:00 Delivery type:  Vaginal, Spontaneous     Baby Feeding: Bottle and Breast Disposition: Likely home with mother.    03/20/2018 Patriciaann Clan, DO  Attestation: I have seen this patient and agree with the resident's documentation. I have examined them separately, and we have discussed the plan of care.  Lambert Mody. Juleen China, DO OB/GYN Fellow

## 2018-03-22 ENCOUNTER — Inpatient Hospital Stay (HOSPITAL_COMMUNITY): Admission: RE | Admit: 2018-03-22 | Payer: Self-pay | Source: Ambulatory Visit

## 2018-03-22 ENCOUNTER — Encounter: Payer: Self-pay | Admitting: Family Medicine

## 2018-03-26 ENCOUNTER — Inpatient Hospital Stay (HOSPITAL_COMMUNITY)
Admission: AD | Admit: 2018-03-26 | Discharge: 2018-03-27 | Disposition: A | Discharge status: Home / Self Care | Payer: Medicaid Other | Attending: Obstetrics & Gynecology | Admitting: Obstetrics & Gynecology

## 2018-03-26 ENCOUNTER — Encounter (HOSPITAL_COMMUNITY): Payer: Self-pay | Admitting: *Deleted

## 2018-03-26 DIAGNOSIS — Z791 Long term (current) use of non-steroidal anti-inflammatories (NSAID): Secondary | ICD-10-CM | POA: Insufficient documentation

## 2018-03-26 DIAGNOSIS — Z79899 Other long term (current) drug therapy: Secondary | ICD-10-CM | POA: Insufficient documentation

## 2018-03-26 DIAGNOSIS — G510 Bell's palsy: Secondary | ICD-10-CM | POA: Insufficient documentation

## 2018-03-26 DIAGNOSIS — Z833 Family history of diabetes mellitus: Secondary | ICD-10-CM | POA: Insufficient documentation

## 2018-03-26 NOTE — MAU Note (Signed)
Had SVD 03/18/18. Has facial paralysis on L side of face. Symptoms started this past Monday. Noticed right after delivery but with everything going on with delivery I forgot it. Has come in tonight due to more pain on R side of face. Sharp pain

## 2018-03-27 ENCOUNTER — Encounter (HOSPITAL_COMMUNITY): Payer: Self-pay | Admitting: *Deleted

## 2018-03-27 DIAGNOSIS — G510 Bell's palsy: Secondary | ICD-10-CM | POA: Diagnosis not present

## 2018-03-27 DIAGNOSIS — R220 Localized swelling, mass and lump, head: Secondary | ICD-10-CM | POA: Diagnosis present

## 2018-03-27 DIAGNOSIS — Z833 Family history of diabetes mellitus: Secondary | ICD-10-CM | POA: Diagnosis not present

## 2018-03-27 DIAGNOSIS — Z791 Long term (current) use of non-steroidal anti-inflammatories (NSAID): Secondary | ICD-10-CM | POA: Diagnosis not present

## 2018-03-27 DIAGNOSIS — Z79899 Other long term (current) drug therapy: Secondary | ICD-10-CM | POA: Diagnosis not present

## 2018-03-27 NOTE — Discharge Instructions (Signed)
Dolor neuroptico (Neuropathic Pain) El dolor neuroptico se produce cuando hay dao en los nervios responsables de ciertas sensaciones del cuerpo (nervios sensitivos). El dolor puede producirse por un dao en las siguientes reas:  Los nervios sensitivos que envan seales a la mdula espinal y el cerebro (sistema nervioso perifrico).  Los nervios sensitivos del cerebro o la mdula espinal (sistema nervioso central). El dolor neuroptico puede volverlo ms sensible al [employer]. Lo que sera una sensacin leve para la [employer] puede ser un dolor muy intenso, si sufre dolor neuroptico. Por lo general, es una enfermedad a largo plazo que puede ser difcil de Risk manager. El tipo de dolor puede diferir de Ardelia Mems persona a Theatre manager. Puede comenzar en forma repentina (agudo) o puede desarrollarse lentamente y durar mucho tiempo (crnico). El dolor neuroptico puede aparecer y Armed forces operational officer a medida que los nervios daados se curan, o puede permanecer estables durante aos. A menudo, provoca angustia, prdida de sueo y [place] calidad de vida inferior. CAUSAS La causa ms comn del dao en los nervios sensitivos es la diabetes. El dolor neuroptico tambin puede producirse por muchas otras enfermedades y afecciones. Las causas del dolor neuroptico pueden clasificarse como:  Txicas. Muchos medicamentos y sustancias qumicas pueden provocar dao txico. La causa ms comn del dolor neuroptico por exposicin a sustancias txicas es el dao que produce el tratamiento Licensed conveyancer (quimioterapia).  Metablicas. Este tipo de dolor puede aparecer cuando una enfermedad provoca desequilibrios que daan los nervios. La diabetes es la ms comn de [employer]. Otra causa comn es la deficiencia de vitamina B causada por el consumo de alcohol prolongado.  Traumticas. Las lesiones que cortan, presionan o estiran un nervio pueden producir dao y Social research officer, government. Un ejemplo comn es sentir dolor despus de  perder un brazo o una pierna (dolor de miembro fantasma).  Causas relacionadas con la compresin. Si un nervio sensitivo queda atrapado o comprimido por [employer], el suministro de sangre al nervio puede interrumpirse.  Vascular. Muchas enfermedades de los vasos sanguneos pueden producir dolor neuroptico al reducir el suministro de [place] y el oxgeno que Lucianne Lei a los nervios.  Autoinmune. Este tipo de Social research officer, government se produce por las [employer] el sistema de defensa del cuerpo ataca por error los nervios sensitivos. Entre los ejemplos de enfermedades autoinmunes que pueden causar dolor neuroptico se incluyen el lupus y Curator.  Infecciosa. Muchos tipos de infecciones virales pueden daar los nervios sensitivos y Engineer, drilling. La infeccin por culebrilla (virus del herpes zster) es una causa comn de este tipo de Social research officer, government.  Hereditarias. El dolor neuroptico puede ser un sntoma de muchas enfermedades que se transmiten entre los miembros de las familias (genticas). SIGNOS Y SNTOMAS El sntoma principal es Conservation officer, historic buildings. El dolor neuroptico a menudo se describe como:  Quemaduras.  Similar a un choque.  Escozor.  Fro o calor.  Picazn. DIAGNSTICO No hay un estudio que pueda diagnosticar el dolor neuroptico. El mdico le har un examen fsico y le preguntar sobre su dolor. Puede usar una escala de dolor para describir la intensidad del dolor. Tambin puede realizarle pruebas para corroborar su sensibilidad al [employer], y ayudar a Pension scheme manager la causa y la ubicacin de los daos en los nervios sensitivos. Estos estudios pueden incluir los siguientes:  Estudios de diagnstico por imgenes, por ejemplo: ? Radiografas. ? Tomografa computarizada. ? Resonancia magntica.  Estudios de conduccin nerviosa para evaluar si las seales nerviosas pasan por los nervios sensitivos en forma  correcta o no (estudios electrodiagnsticos).  Estimulacin de los nervios sensitivos a  travs de electrodos que se colocan en la piel, y la medicin de la respuesta en la mdula espinal y el cerebro (potenciales provocados somatosensitivos). TRATAMIENTO El tratamiento para el dolor neuroptico puede cambiar con el [place]. Es posible que necesite probar distintas opciones de tratamientos o una combinacin de tratamientos. Entre las opciones se incluyen las siguientes:  Analgsicos de [place].  Medicamentos recetados. Algunos medicamentos que se utilizan para tratar otras enfermedades tambin pueden ser adecuados para el dolor neuroptico. Entre ellos se incluyen los medicamentos para: ? Chief Technology Officer las convulsiones (anticonvulsivos). ? Aliviar la depresin (antidepresivos).  Analgsicos que requieren receta mdica (narcticos). Por lo general, estos medicamentos se utilizan cuando otros analgsicos no son eficaces.  Neuroestimulacin transcutnea (TENS). En este tratamiento se utilizan corrientes elctricas para bloquear las seales nerviosas dolorosas. El tratamiento es indoloro.  Anestsicos tpicos y locales. Estos son medicamentos que [employer] nervios. Pueden inyectarse como bloqueos nerviosos o aplicarse en la piel.  Tratamientos alternativos, como: ? Acupuntura. ? Meditacin. ? Masajes. ? Fisioterapia. ? Programas para el control del dolor. ? Psicoterapia. INSTRUCCIONES PARA EL CUIDADO EN EL HOGAR  Infrmese todo lo que pueda sobre su enfermedad.  Tome los medicamentos solamente como se lo haya indicado el mdico.  Trabaje en estrecha colaboracin con todos los mdicos para hallar el tratamiento ms adecuado para usted.  Tenga un buen sistema de Teaching laboratory technician.  Considere la opcin de unirse a un grupo de apoyo para el dolor crnico. SOLICITE ATENCIN MDICA SI:  Sus tratamientos para Conservation officer, historic buildings no son eficaces.  Los [employer] causan [employer].  Est lidiando con sntomas de fatiga, cambios en el estado de nimo, depresin o  ansiedad. Esta informacin no tiene Marine scientist el consejo del mdico. Asegrese de hacerle al mdico cualquier pregunta que tenga. Document Released: 08/05/2007 Document Revised: 05/19/2014 Document Reviewed: 10/06/2013 Elsevier Interactive Patient Education  [employer].

## 2018-03-27 NOTE — Progress Notes (Signed)
Marcille Buffy CNM in earlier and discuss d/c plan with pt using [employer] interpreter. Written and verbal d/c instrucitons given and understanding voiced.

## 2018-03-27 NOTE — MAU Provider Note (Signed)
History     CSN: [id number]  Arrival date and time: 03/26/18 2323   First Provider Initiated Contact with Patient 03/27/18 0034      No chief complaint on file.  Kari Ortega is a 37 y.o. [id number] at who is 9 days S/P NSVD here today with facial swelling and paralysis. She states that since the baby was born she has been dribbling water out of her mouth when she tries to drink. She has had facial paralysis for many years, since during her pregnancy with one of her children. She states that after her other children she has had a lot of swelling in the face, and increased pain that has resolved after a period of time. She has had many treatments in [place] for this in the past including "electrical stimulation" and "needles" in the past.    OB History    Gravida  5   Para  3   Term  3   Preterm      AB  2   Living  3     SAB  1   TAB      Ectopic  1   Multiple  0   Live Births  3           Past Medical History:  Diagnosis Date  . Anemia   . 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  Unable to determine reason per pt.    . Irregular periods     Past Surgical History:  Procedure Laterality Date  . TOOTH EXTRACTION      Family History  Problem Relation Age of Onset  . Diabetes Mother     Social History   Tobacco Use  . Smoking status: Never Smoker  . Smokeless tobacco: Never Used  Substance Use Topics  . Alcohol use: No  . Drug use: No    Allergies: No Known Allergies  Medications Prior to Admission  Medication Sig Dispense Refill Last Dose  . cyclobenzaprine (FLEXERIL) 10 MG tablet Take 1 tablet (10 mg total) by mouth 2 (two) times daily as needed for muscle spasms. 20 tablet 0 Past Week at Unknown time  . famotidine (PEPCID) 20 MG tablet Take 1 tablet (20 mg total) by mouth 2 (two) times daily. 30 tablet 3 Past Week at Unknown time  . ferrous sulfate 325 (65 FE) MG tablet Take 1 tablet (325 mg total) by  mouth daily. 30 tablet 0 03/26/2018 at Unknown time  . ibuprofen (ADVIL,MOTRIN) 600 MG tablet Take 1 tablet (600 mg total) by mouth every 6 (six) hours. 30 tablet 0 Past Week at Unknown time  . Prenatal Vit-Fe Fumarate-FA (MULTIVITAMIN-PRENATAL) 27-0.8 MG TABS tablet Take 1 tablet by mouth daily at 12 noon.   Past Week at Unknown time  . ranitidine (ZANTAC) 150 MG tablet Take 150 mg by mouth daily as needed for heartburn.   Past Week at Unknown time  . senna-docusate (SENOKOT-S) 8.6-50 MG tablet Take 2 tablets by mouth daily. 15 tablet 0     Review of Systems  Constitutional: Negative for chills and fever.  Gastrointestinal: Negative for nausea and vomiting.  Genitourinary: Negative for pelvic pain, vaginal bleeding and vaginal discharge.   Physical Exam   Blood pressure 125/80, pulse 97, temperature 99 F (37.2 C), resp. rate 18, height 4\' 9"  (1.448 m), weight 81.6 kg, currently breastfeeding.  Physical Exam  Nursing note and vitals reviewed. Constitutional: She is oriented to person, place, and time.  She appears well-developed and well-nourished. No distress.  HENT:  Head: Normocephalic.  Cardiovascular: Normal rate.  Respiratory: Effort normal.  GI: Soft. There is no tenderness.  Neurological: She is alert and oriented to person, place, and time.  Skin: Skin is warm and dry.  Psychiatric: She has a normal mood and affect.    MAU Course  Procedures  MDM Int# [id number] used for the visit.   Lengthy discussion with the use of interpretor. Patient is asking for [place] to possibly provide the injections that she had in [place] in the past as those really helped her pain when this happened after the birth of another of her children. Patient advised that we cannot provide these here as a neurologist would need to do this, and I am not sure what these shots may have been. Patient reassured that there does not appear to be a new or acute problem today, and that this has been a long term  issue for her. Advised her to call the clinic and we can provide neurology referral as needed.   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    Assessment and Plan   1. Facial paralysis    DC home Comfort measures reviewed  RX: no new RX  Return to MAU as needed FU with OB as planned  Follow-up Information    Clent Demark, PA-C Follow up.   Specialty:  Physician Assistant Contact information: [place] [address] [phone]        [facility] Follow up.   Specialty:  Obstetrics and Gynecology Contact information: [place] 03/27/2018, 12:38 AM

## 2018-03-30 ENCOUNTER — Other Ambulatory Visit: Payer: Self-pay | Admitting: *Deleted

## 2018-03-30 DIAGNOSIS — G510 Bell's palsy: Secondary | ICD-10-CM

## 2018-03-30 NOTE — Progress Notes (Signed)
Order placed for [facility] for a consult per Dr. 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 She is now having facial pain assoc with the paralysis. Was seen in the MAU. She was treated prev in [place]. "  The office did not pick up and a voicemail was left requesting that they call our office to discuss the referral.

## 2018-03-31 NOTE — Progress Notes (Signed)
I called [facility] and confirmed they received the referral. They informed me they review the referral and they will call the patient with the appointment.

## 2018-04-01 ENCOUNTER — Emergency Department (HOSPITAL_COMMUNITY)
Admission: EM | Admit: 2018-04-01 | Discharge: 2018-04-01 | Disposition: A | Discharge status: Home / Self Care | Payer: Medicaid Other | Attending: Emergency Medicine | Admitting: Emergency Medicine

## 2018-04-01 ENCOUNTER — Encounter (HOSPITAL_COMMUNITY): Payer: Self-pay | Admitting: Emergency Medicine

## 2018-04-01 DIAGNOSIS — Z79899 Other long term (current) drug therapy: Secondary | ICD-10-CM | POA: Diagnosis not present

## 2018-04-01 DIAGNOSIS — G510 Bell's palsy: Secondary | ICD-10-CM

## 2018-04-01 DIAGNOSIS — G519 Disorder of facial nerve, unspecified: Secondary | ICD-10-CM | POA: Diagnosis not present

## 2018-04-01 DIAGNOSIS — R600 Localized edema: Secondary | ICD-10-CM | POA: Diagnosis present

## 2018-04-01 MED ORDER — PREDNISONE 20 MG PO TABS
60.0000 mg | ORAL_TABLET | ORAL | Status: AC
  Administered 2018-04-01: 60 mg via ORAL
  Filled 2018-04-01: qty 3

## 2018-04-01 MED ORDER — PREDNISONE 20 MG PO TABS: 40.0000 mg | ORAL_TABLET | Freq: Every day | ORAL | 0 refills | Status: DC

## 2018-04-01 NOTE — ED Provider Notes (Signed)
[place] EMERGENCY DEPARTMENT Provider Note   CSN: [id number] Arrival date & time: 04/01/18  1957     History   Chief Complaint Chief Complaint  Patient presents with  . Facial Swelling  . Back Pain    HPI Kari Ortega is a 38 y.o. female.  HPI Patient with a history of facial paralysis since she was a 37 year old presents with more discomfort and worsening paralysis. Patient has chronic left-sided facial droop, difficulty with speech, drooling. She notes that since her delivery of a healthy child 2 weeks ago, she has had more pronounced swelling in the left side of her face, without new other weakness, without new vision loss. No change in speech difficulty. She has some discomfort around the left nasolabial fold.  She notes that she has had similar exacerbations in the past, possibly around pregnancy. She does not see a neurologist here. She has previously seen a physician in [place], 7 years ago, who did some therapy for relief, but otherwise no clear relieving or exacerbating factors.   Past Medical History:  Diagnosis Date  . Anemia   . 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  Unable to determine reason per pt.    . Irregular periods     Patient Active Problem List   Diagnosis Date Noted  . Normal labor 03/18/2018  . 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 BMI 30-39.9 09/21/2017  . Obesity in pregnancy 09/21/2017  . Language barrier 09/21/2017  . Supervision of high risk pregnancy, antepartum 08/24/2017  . AMA (advanced maternal age) multigravida 35+ 08/24/2017  . HYPERTRIGLYCERIDEMIA 12/15/2006    Past Surgical History:  Procedure Laterality Date  . TOOTH EXTRACTION       OB History    Gravida  5   Para  3   Term  3   Preterm      AB  2   Living  3     SAB  1   TAB      Ectopic  1   Multiple  0   Live Births  3            Home Medications    Prior to Admission  medications   Medication Sig Start Date End Date Taking? Authorizing Provider  cyclobenzaprine (FLEXERIL) 10 MG tablet Take 1 tablet (10 mg total) by mouth 2 (two) times daily as needed for muscle spasms. 01/12/18   Darlina Rumpf, CNM  famotidine (PEPCID) 20 MG tablet Take 1 tablet (20 mg total) by mouth 2 (two) times daily. 10/13/17   Lajean Manes, CNM  ferrous sulfate 325 (65 FE) MG tablet Take 1 tablet (325 mg total) by mouth daily. 03/20/18 04/19/18  Patriciaann Clan, DO  ibuprofen (ADVIL,MOTRIN) 600 MG tablet Take 1 tablet (600 mg total) by mouth every 6 (six) hours. 03/20/18   Patriciaann Clan, DO  predniSONE (DELTASONE) 20 MG tablet Take 2 tablets (40 mg total) by mouth daily with breakfast. For the next four days 04/01/18   Carmin Muskrat, MD  Prenatal Vit-Fe Fumarate-FA (MULTIVITAMIN-PRENATAL) 27-0.8 MG TABS tablet Take 1 tablet by mouth daily at 12 noon.    [provider]  ranitidine (ZANTAC) 150 MG tablet Take 150 mg by mouth daily as needed for heartburn.    [provider]  senna-docusate (SENOKOT-S) 8.6-50 MG tablet Take 2 tablets by mouth daily. 03/21/18   Patriciaann Clan, DO    Family History Family History  Problem Relation Age of Onset  . Diabetes Mother     Social History Social History   Tobacco Use  . Smoking status: Never Smoker  . Smokeless tobacco: Never Used  Substance Use Topics  . Alcohol use: No  . Drug use: No     Allergies   Patient has no known allergies.   Review of Systems Review of Systems  Constitutional:       Per HPI, otherwise negative  HENT: Positive for drooling.   Eyes: Negative for visual disturbance.  Respiratory:       Per HPI, otherwise negative  Cardiovascular:       Per HPI, otherwise negative  Gastrointestinal: Negative for vomiting.  Endocrine:       Negative aside from HPI  Genitourinary:       Neg aside from HPI   Musculoskeletal:       Per HPI, otherwise negative  Skin: Negative.     Allergic/Immunologic: Negative for immunocompromised state.  Neurological: Positive for speech difficulty. Negative for syncope and weakness.     Physical Exam Updated Vital Signs BP 108/80 (BP Location: Right Arm)   Pulse 80   Temp 98.8 F (37.1 C) (Oral)   Resp 16   SpO2 100%   Physical Exam  Constitutional: She is oriented to person, place, and time. She appears well-developed and well-nourished. No distress.  HENT:  Head: Normocephalic and atraumatic.  Eyes: Conjunctivae and EOM are normal.  Cardiovascular: Normal rate and regular rhythm.  Pulmonary/Chest: Effort normal and breath sounds normal. No stridor. No respiratory distress.  Abdominal: She exhibits no distension.  Musculoskeletal: She exhibits no edema.  Neurological: She is alert and oriented to person, place, and time. A cranial nerve deficit is present.  Left facial droop, with substantial loss of facial tone. Patient moves all 4 extremities appropriately, has 5/5 strength in upper and lower extremities, symmetrically.  Skin: Skin is warm and dry.  Psychiatric: She has a normal mood and affect.  Nursing note and vitals reviewed.    ED Treatments / Results   Procedures Procedures (including critical care time)  Medications Ordered in ED Medications  predniSONE (DELTASONE) tablet 60 mg (has no administration in time range)     Initial Impression / Assessment and Plan / ED Course  I have reviewed the triage vital signs and the nursing notes.  Pertinent labs & imaging results that were available during my care of the patient were reviewed by me and considered in my medical decision making (see chart for details).    Patient with long-standing facial paralysis presents with more discomfort from the symptoms, but without evidence for acute new neurologic phenomenon. Some suspicion of the patient's increased discomfort coming from her recent pregnancy. No evidence for distress, respiratory compromise, low  suspicion for stroke. Patient starting a course of steroids, will follow-up with neurology.   Final Clinical Impressions(s) / ED Diagnoses   Final diagnoses:  Facial paralysis    ED Discharge Orders         Ordered    predniSONE (DELTASONE) 20 MG tablet  Daily with breakfast     04/01/18 2142           Carmin Muskrat, MD 04/01/18 2147

## 2018-04-01 NOTE — ED Triage Notes (Signed)
According to patients chart, facial droop is not acute

## 2018-04-01 NOTE — Discharge Instructions (Addendum)
As discussed, your evaluation today has been largely reassuring.  But, it is important that you monitor your condition carefully, and do not hesitate to return to the ED if you develop new, or concerning changes in your condition. ? ?Otherwise, please follow-up with our physicians for appropriate ongoing care. ? ?

## 2018-04-01 NOTE — ED Notes (Signed)
PT states understanding of care given, follow up care, and medication prescribed. PT ambulated from ED to car with a steady gait. 

## 2018-04-01 NOTE — ED Notes (Signed)
Patient is A&Ox4.  No signs of distress noted.  Please see providers complete history and physical exam.  

## 2018-04-01 NOTE — ED Triage Notes (Signed)
Pt presents with facial swelling x 2 weeks; also adds lower back pain; 2 weeks postpartum

## 2018-04-14 ENCOUNTER — Ambulatory Visit (INDEPENDENT_AMBULATORY_CARE_PROVIDER_SITE_OTHER): Payer: Self-pay | Admitting: Nurse Practitioner

## 2018-04-14 VITALS — BP 116/69 | HR 80 | Wt 170.6 lb

## 2018-04-14 DIAGNOSIS — Z202 Contact with and (suspected) exposure to infections with a predominantly sexual mode of transmission: Secondary | ICD-10-CM

## 2018-04-14 DIAGNOSIS — Z113 Encounter for screening for infections with a predominantly sexual mode of transmission: Secondary | ICD-10-CM

## 2018-04-14 DIAGNOSIS — N898 Other specified noninflammatory disorders of vagina: Secondary | ICD-10-CM

## 2018-04-14 LAB — POCT URINALYSIS DIP (DEVICE)
Bilirubin Urine: NEGATIVE
Glucose, UA: NEGATIVE mg/dL
HGB URINE DIPSTICK: NEGATIVE
Ketones, ur: NEGATIVE mg/dL
NITRITE: NEGATIVE
PH: 6 (ref 5.0–8.0)
PROTEIN: NEGATIVE mg/dL
Specific Gravity, Urine: 1.02 (ref 1.005–1.030)
Urobilinogen, UA: 0.2 mg/dL (ref 0.0–1.0)

## 2018-04-14 MED ORDER — FLUCONAZOLE 150 MG PO TABS: 150.0000 mg | ORAL_TABLET | Freq: Once | ORAL | 0 refills | Status: AC

## 2018-04-14 NOTE — Progress Notes (Signed)
   GYNECOLOGY OFFICE VISIT NOTE   History:  37 y.o. [id number] here today as she has pain in lower abdomen when voiding.  She was here for a nurse visit but her urinalysis was completely clear so changed to a provider appointment. She has not yet had her postpartum appointment - that is scheduled but she could not wait as she was having pain today.  She denies any abnormal vaginal discharge, bleeding, pelvic pain or other concerns. Interpreter present for the entire visit.  Past Medical History:  Diagnosis Date  . Anemia   . 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  Unable to determine reason per pt.    . Irregular periods     Past Surgical History:  Procedure Laterality Date  . TOOTH EXTRACTION      The following portions of the patient's history were reviewed and updated as appropriate: allergies, current medications, past family history, past medical history, past social history, past surgical history and problem list.     Review of Systems:  Pertinent items noted in HPI and remainder of comprehensive ROS otherwise negative.  Objective:  Physical Exam BP 116/69   Pulse 80   Wt 170 lb 9.6 oz (77.4 kg)   BMI 36.92 kg/m  CONSTITUTIONAL: Well-developed, well-nourished female in no acute distress.  HENT:  Normocephalic, atraumatic. External right and left ear normal.  EYES: Conjunctivae and EOM are normal. Pupils are equal, round.  No scleral icterus.  NECK: Normal range of motion, supple, no masses SKIN: Skin is warm and dry. No rash noted. Not diaphoretic. No erythema. No pallor. NEUROLOGIC: Alert and oriented to person, place, and time. Normal muscle tone coordination. No cranial nerve deficit noted. PSYCHIATRIC: Normal mood and affect. Normal behavior. Normal judgment and thought content. CARDIOVASCULAR: Normal heart rate noted RESPIRATORY: Effort and breath sounds normal, no problems with respiration noted ABDOMEN: Soft, no distention noted.  Pain is in  the lowest portion of her groin bilaterally when she voids. PELVIC: Normal appearing external genitalia; normal appearing vaginal mucosa and cervix.  No abnormal discharge noted.  Normal uterine size, no other palpable masses, no uterine or adnexal tenderness.  Swab done for GC/Chlam.  No discharge noted but slight erythema which could be characteristic of early yeast infection. MUSCULOSKELETAL: Normal range of motion. No edema noted.  Labs and Imaging No results found.  Assessment & Plan:  1. Possible exposure to STD Testing done and will treat for a possible yeast infection.  Diflucan sent to her pharmacy Encouraged to keep her appointment for her postpartum checkup. - Cervicovaginal ancillary only( [place])   Routine preventative health maintenance measures emphasized. Please refer to After Visit Summary for other counseling recommendations.   Return for scheduled postpartum appointment.   Total face-to-face time with patient: 15 minutes.  Over 50% of encounter was spent on counseling and coordination of care.  Earlie Server, RN, MSN, NP-BC Nurse Practitioner, [facility] for [employer], [facility] 04/14/2018 5:29 PM

## 2018-04-14 NOTE — Patient Instructions (Signed)
Monistat vaginal cream  DO NOT use Vagisil

## 2018-04-14 NOTE — Progress Notes (Signed)
Pt here for UTI check, states a lot of pain in Pelvic area in the last 3 days , thinks it's her Bladder, also in the back of her left leg.Pt states she has the sensation to urinate but only does a little and sometimes has a small amount of dark blood in it. Pt states also is having sharp pains in Vagina. Will review with a provider.

## 2018-04-16 LAB — CERVICOVAGINAL ANCILLARY ONLY
BACTERIAL VAGINITIS: NEGATIVE
Candida vaginitis: NEGATIVE
Chlamydia: NEGATIVE
NEISSERIA GONORRHEA: NEGATIVE
Trichomonas: NEGATIVE

## 2018-04-22 ENCOUNTER — Emergency Department (HOSPITAL_COMMUNITY)
Admission: EM | Admit: 2018-04-22 | Discharge: 2018-04-23 | Disposition: A | Discharge status: Home / Self Care | Payer: Self-pay | Attending: Emergency Medicine | Admitting: Emergency Medicine

## 2018-04-22 ENCOUNTER — Encounter (HOSPITAL_COMMUNITY): Payer: Self-pay | Admitting: Emergency Medicine

## 2018-04-22 ENCOUNTER — Other Ambulatory Visit: Payer: Self-pay

## 2018-04-22 DIAGNOSIS — R1030 Lower abdominal pain, unspecified: Secondary | ICD-10-CM | POA: Insufficient documentation

## 2018-04-22 DIAGNOSIS — R1032 Left lower quadrant pain: Secondary | ICD-10-CM

## 2018-04-22 DIAGNOSIS — R7989 Other specified abnormal findings of blood chemistry: Secondary | ICD-10-CM

## 2018-04-22 DIAGNOSIS — R945 Abnormal results of liver function studies: Secondary | ICD-10-CM | POA: Insufficient documentation

## 2018-04-22 DIAGNOSIS — Z79899 Other long term (current) drug therapy: Secondary | ICD-10-CM | POA: Insufficient documentation

## 2018-04-22 LAB — URINALYSIS, ROUTINE W REFLEX MICROSCOPIC
BILIRUBIN URINE: NEGATIVE
Bacteria, UA: NONE SEEN
Glucose, UA: NEGATIVE mg/dL
HGB URINE DIPSTICK: NEGATIVE
KETONES UR: NEGATIVE mg/dL
Nitrite: NEGATIVE
PROTEIN: NEGATIVE mg/dL
SPECIFIC GRAVITY, URINE: 1.017 (ref 1.005–1.030)
pH: 7 (ref 5.0–8.0)

## 2018-04-22 NOTE — ED Triage Notes (Signed)
Pt reports lower abd pain off/on X1 week. Pt also reports urinary discomfort.

## 2018-04-22 NOTE — ED Notes (Signed)
Patient states she is having problems urinating.  She states when she tries to urinate, she only gets a little out at a time.  She states Sx for about 10 - 12 days ago.  She further states she went to  The clinic about a week ago and received a single pill for Sx ?(Diflucan)?Marland Kitchen  No improvement with the medication.

## 2018-04-22 NOTE — ED Notes (Signed)
Pt seen in clinic last Thursday and given antifungal. Denies improvement.

## 2018-04-23 ENCOUNTER — Emergency Department (HOSPITAL_COMMUNITY): Payer: Self-pay

## 2018-04-23 LAB — CBC
HCT: 35.4 % — ABNORMAL LOW (ref 36.0–46.0)
Hemoglobin: 11 g/dL — ABNORMAL LOW (ref 12.0–15.0)
MCH: 26.7 pg (ref 26.0–34.0)
MCHC: 31.1 g/dL (ref 30.0–36.0)
MCV: 85.9 fL (ref 80.0–100.0)
NRBC: 0 % (ref 0.0–0.2)
PLATELETS: 290 10*3/uL (ref 150–400)
RBC: 4.12 MIL/uL (ref 3.87–5.11)
RDW: 15.1 % (ref 11.5–15.5)
WBC: 9.5 10*3/uL (ref 4.0–10.5)

## 2018-04-23 LAB — COMPREHENSIVE METABOLIC PANEL
ALT: 111 U/L — AB (ref 0–44)
AST: 86 U/L — AB (ref 15–41)
Albumin: 3.5 g/dL (ref 3.5–5.0)
Alkaline Phosphatase: 100 U/L (ref 38–126)
Anion gap: 9 (ref 5–15)
BUN: 11 mg/dL (ref 6–20)
CALCIUM: 9 mg/dL (ref 8.9–10.3)
CO2: 27 mmol/L (ref 22–32)
Chloride: 106 mmol/L (ref 98–111)
Creatinine, Ser: 0.62 mg/dL (ref 0.44–1.00)
Glucose, Bld: 113 mg/dL — ABNORMAL HIGH (ref 70–99)
Potassium: 3.5 mmol/L (ref 3.5–5.1)
Sodium: 142 mmol/L (ref 135–145)
Total Bilirubin: 0.4 mg/dL (ref 0.3–1.2)
Total Protein: 7.1 g/dL (ref 6.5–8.1)

## 2018-04-23 LAB — WET PREP, GENITAL
Clue Cells Wet Prep HPF POC: NONE SEEN
Sperm: NONE SEEN
Trich, Wet Prep: NONE SEEN
Yeast Wet Prep HPF POC: NONE SEEN

## 2018-04-23 LAB — LIPASE, BLOOD: Lipase: 29 U/L (ref 11–51)

## 2018-04-23 LAB — I-STAT BETA HCG BLOOD, ED (MC, WL, AP ONLY): I-stat hCG, quantitative: <5 m[IU]/mL (ref <5)

## 2018-04-23 LAB — GC/CHLAMYDIA PROBE AMP (~~LOC~~) NOT AT ARMC
Chlamydia: NEGATIVE
Neisseria Gonorrhea: NEGATIVE

## 2018-04-23 NOTE — ED Notes (Signed)
ED Provider at bedside. 

## 2018-04-23 NOTE — ED Notes (Signed)
Patient transported to Ultrasound 

## 2018-04-23 NOTE — Discharge Instructions (Signed)
Continue taking ibuprofen as prescribed over-the-counter for your pain.  Please follow-up with your doctor for further evaluation and treatment of your abdominal pain as well as further work-up of your elevated liver function tests.  Please follow-up with your OB/GYN as planned.  Please return the emergency department to be develop any new or worsening symptoms.

## 2018-04-23 NOTE — ED Provider Notes (Signed)
[facility]GENC[facility] DEPARTMENT Provider Note   CSN: [id number] Arrival date & time: 04/22/18  2236     History   Chief Complaint Chief Complaint  Patient presents with  . Abdominal Pain    HPI Kari Ortega is a 37 y.o. female with history left facial paralysis, recent vaginal delivery 5 weeks ago who presents with a 10 to 12-day history of suprapubic and left lower quadrant pain that is constant and sharp. Her pain is worse when she walks around. She has had urinary frequency and urgency.  She has had some pain since her delivery to her sacrum and tailbone area.  She reports it is worse with movement.  She was seen at the [facility] clinic a week ago and given Diflucan.  She denies any significant vaginal discharge, fevers, chest pain, shortness of breath, nausea, vomiting, diarrhea.  She has had some intermittent constipation.  She had no complications surrounding her pregnancy.  HPI  Past Medical History:  Diagnosis Date  . Anemia   . 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  Unable to determine reason per pt.    . Irregular periods     Patient Active Problem List   Diagnosis Date Noted  . Normal labor 03/18/2018  . 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 BMI 30-39.9 09/21/2017  . Obesity in pregnancy 09/21/2017  . Language barrier 09/21/2017  . Supervision of high risk pregnancy, antepartum 08/24/2017  . AMA (advanced maternal age) multigravida 35+ 08/24/2017  . HYPERTRIGLYCERIDEMIA 12/15/2006    Past Surgical History:  Procedure Laterality Date  . TOOTH EXTRACTION       OB History    Gravida  5   Para  3   Term  3   Preterm      AB  2   Living  3     SAB  1   TAB      Ectopic  1   Multiple  0   Live Births  3            Home Medications    Prior to Admission medications   Medication Sig Start Date End Date Taking? Authorizing Provider  ferrous sulfate 325 (65 FE) MG tablet Take 1  tablet (325 mg total) by mouth daily. 03/20/18 04/23/26 Yes Beard, Ralph Leyden, DO  Prenatal Vit-Fe Fumarate-FA (MULTIVITAMIN-PRENATAL) 27-0.8 MG TABS tablet Take 1 tablet by mouth daily at 12 noon.   Yes [provider]  cyclobenzaprine (FLEXERIL) 10 MG tablet Take 1 tablet (10 mg total) by mouth 2 (two) times daily as needed for muscle spasms. Patient not taking: Reported on 04/14/2018 01/12/18   Darlina Rumpf, CNM  famotidine (PEPCID) 20 MG tablet Take 1 tablet (20 mg total) by mouth 2 (two) times daily. Patient not taking: Reported on 04/14/2018 10/13/17   Lajean Manes, CNM  ibuprofen (ADVIL,MOTRIN) 600 MG tablet Take 1 tablet (600 mg total) by mouth every 6 (six) hours. Patient not taking: Reported on 04/14/2018 03/20/18   Patriciaann Clan, DO  predniSONE (DELTASONE) 20 MG tablet Take 2 tablets (40 mg total) by mouth daily with breakfast. For the next four days Patient not taking: Reported on 04/14/2018 04/01/18   Carmin Muskrat, MD  senna-docusate (SENOKOT-S) 8.6-50 MG tablet Take 2 tablets by mouth daily. Patient not taking: Reported on 04/23/2018 03/21/18   Patriciaann Clan, DO    Family History Family History  Problem Relation Age of Onset  .  Diabetes Mother     Social History Social History   Tobacco Use  . Smoking status: Never Smoker  . Smokeless tobacco: Never Used  Substance Use Topics  . Alcohol use: No  . Drug use: No     Allergies   Patient has no known allergies.   Review of Systems Review of Systems  Constitutional: Negative for chills and fever.  HENT: Negative for facial swelling and sore throat.   Respiratory: Negative for shortness of breath.   Cardiovascular: Negative for chest pain.  Gastrointestinal: Positive for abdominal pain. Negative for nausea and vomiting.  Genitourinary: Positive for frequency and urgency. Negative for dysuria, vaginal bleeding and vaginal discharge.  Musculoskeletal: Positive for back pain.  Skin: Negative  for rash and wound.  Neurological: Negative for headaches.  Psychiatric/Behavioral: The patient is not nervous/anxious.      Physical Exam Updated Vital Signs BP (!) 110/58   Pulse 78   Temp 98.7 F (37.1 C) (Oral)   Resp 16   Ht 4' 9.09" (1.45 m)   Wt 77.4 kg   SpO2 99%   BMI 36.80 kg/m   Physical Exam Vitals signs and nursing note reviewed.  Constitutional:      General: She is not in acute distress.    Appearance: She is well-developed. She is not diaphoretic.  HENT:     Head: Normocephalic and atraumatic.     Mouth/Throat:     Pharynx: No oropharyngeal exudate.  Eyes:     General: No scleral icterus.       Right eye: No discharge.        Left eye: No discharge.     Conjunctiva/sclera: Conjunctivae normal.     Pupils: Pupils are equal, round, and reactive to light.  Neck:     Musculoskeletal: Normal range of motion and neck supple.     Thyroid: No thyromegaly.  Cardiovascular:     Rate and Rhythm: Normal rate and regular rhythm.     Heart sounds: Normal heart sounds. No murmur. No friction rub. No gallop.   Pulmonary:     Effort: Pulmonary effort is normal. No respiratory distress.     Breath sounds: Normal breath sounds. No stridor. No wheezing or rales.  Abdominal:     General: Bowel sounds are normal. There is no distension.     Palpations: Abdomen is soft.     Tenderness: There is abdominal tenderness in the suprapubic area and left lower quadrant. There is no right CVA tenderness, left CVA tenderness, guarding or rebound.  Genitourinary:    Vagina: No vaginal discharge.     Cervix: Discharge (clear) present. No cervical motion tenderness.     Uterus: Tender.      Adnexa:        Right: No tenderness.         Left: Tenderness present.      Comments: Palpation of uterus causes urinary frequency Musculoskeletal:       Back:  Lymphadenopathy:     Cervical: No cervical adenopathy.  Skin:    General: Skin is warm and dry.     Coloration: Skin is not  pale.     Findings: No rash.  Neurological:     Mental Status: She is alert.     Coordination: Coordination normal.      ED Treatments / Results  Labs (all labs ordered are listed, but only abnormal results are displayed) Labs Reviewed  WET PREP, GENITAL - Abnormal; Notable for the following components:  Result Value   WBC, Wet Prep HPF POC MANY (*)    All other components within normal limits  COMPREHENSIVE METABOLIC PANEL - Abnormal; Notable for the following components:   Glucose, Bld 113 (*)    AST 86 (*)    ALT 111 (*)    All other components within normal limits  CBC - Abnormal; Notable for the following components:   Hemoglobin 11.0 (*)    HCT 35.4 (*)    All other components within normal limits  URINALYSIS, ROUTINE W REFLEX MICROSCOPIC - Abnormal; Notable for the following components:   Leukocytes, UA TRACE (*)    All other components within normal limits  LIPASE, BLOOD  I-STAT BETA HCG BLOOD, ED ([facility], [facility], [facility] ONLY)  GC/CHLAMYDIA PROBE AMP () NOT AT [facility]    EKG None  Radiology US Transvaginal Non-ob  Result Date: 04/23/2018 CLINICAL DATA:  Left lower quadrant abdomen and pelvic pain with urinary frequency and urgency for 1 week. Status post vaginal delivery 5 weeks ago. EXAM: TRANSABDOMINAL AND TRANSVAGINAL ULTRASOUND OF PELVIS DOPPLER ULTRASOUND OF OVARIES TECHNIQUE: Both transabdominal and transvaginal ultrasound examinations of the pelvis were performed. Transabdominal technique was performed for global imaging of the pelvis including uterus, ovaries, adnexal regions, and pelvic cul-de-sac. It was necessary to proceed with endovaginal exam following the transabdominal exam to visualize the uterus, ovaries and endometrium in better detail. Color and duplex Doppler ultrasound was utilized to evaluate blood flow to the ovaries. COMPARISON:  Pelvic ultrasound dated 10/09/2016 and intervening obstetrical ultrasounds. FINDINGS: Uterus Measurements: 7.9 x  6.0 x 5.9 cm = volume: 150 mL. No fibroids or other mass visualized. Endometrium Thickness: 8.2 mm. Filled with hypoechoic fluid. No internal blood flow seen with color Doppler. Right ovary Measurements: 3.2 x 2.0 x 1.6 cm = volume: 3.4 mL. Normal appearance/no adnexal mass. Left ovary Measurements: 2.7 x 2.1 x 1.3 cm = volume: 4.1 mL. Normal appearance/no adnexal mass. Pulsed Doppler evaluation of both ovaries demonstrates normal low-resistance arterial and venous waveforms. Other findings No abnormal free fluid. IMPRESSION: 1. Hypoechoic fluid filling the endometrial cavity. This could represent blood. Infected fluid associated with endometritis is a possibility. Retained products of conception is unlikely since this has no internal blood flow with color Doppler. 2. Otherwise, normal examination. Electronically Signed   By: Claudie Revering M.D.   On: 04/23/2018 03:06   US Pelvis Complete  Result Date: 04/23/2018 CLINICAL DATA:  Left lower quadrant abdomen and pelvic pain with urinary frequency and urgency for 1 week. Status post vaginal delivery 5 weeks ago. EXAM: TRANSABDOMINAL AND TRANSVAGINAL ULTRASOUND OF PELVIS DOPPLER ULTRASOUND OF OVARIES TECHNIQUE: Both transabdominal and transvaginal ultrasound examinations of the pelvis were performed. Transabdominal technique was performed for global imaging of the pelvis including uterus, ovaries, adnexal regions, and pelvic cul-de-sac. It was necessary to proceed with endovaginal exam following the transabdominal exam to visualize the uterus, ovaries and endometrium in better detail. Color and duplex Doppler ultrasound was utilized to evaluate blood flow to the ovaries. COMPARISON:  Pelvic ultrasound dated 10/09/2016 and intervening obstetrical ultrasounds. FINDINGS: Uterus Measurements: 7.9 x 6.0 x 5.9 cm = volume: 150 mL. No fibroids or other mass visualized. Endometrium Thickness: 8.2 mm. Filled with hypoechoic fluid. No internal blood flow seen with color  Doppler. Right ovary Measurements: 3.2 x 2.0 x 1.6 cm = volume: 3.4 mL. Normal appearance/no adnexal mass. Left ovary Measurements: 2.7 x 2.1 x 1.3 cm = volume: 4.1 mL. Normal appearance/no adnexal mass. Pulsed Doppler evaluation  of both ovaries demonstrates normal low-resistance arterial and venous waveforms. Other findings No abnormal free fluid. IMPRESSION: 1. Hypoechoic fluid filling the endometrial cavity. This could represent blood. Infected fluid associated with endometritis is a possibility. Retained products of conception is unlikely since this has no internal blood flow with color Doppler. 2. Otherwise, normal examination. Electronically Signed   By: Claudie Revering M.D.   On: 04/23/2018 03:06   [place] Art/ven Flow Abd Pelv Doppler  Result Date: 04/23/2018 CLINICAL DATA:  Left lower quadrant abdomen and pelvic pain with urinary frequency and urgency for 1 week. Status post vaginal delivery 5 weeks ago. EXAM: TRANSABDOMINAL AND TRANSVAGINAL ULTRASOUND OF PELVIS DOPPLER ULTRASOUND OF OVARIES TECHNIQUE: Both transabdominal and transvaginal ultrasound examinations of the pelvis were performed. Transabdominal technique was performed for global imaging of the pelvis including uterus, ovaries, adnexal regions, and pelvic cul-de-sac. It was necessary to proceed with endovaginal exam following the transabdominal exam to visualize the uterus, ovaries and endometrium in better detail. Color and duplex Doppler ultrasound was utilized to evaluate blood flow to the ovaries. COMPARISON:  Pelvic ultrasound dated 10/09/2016 and intervening obstetrical ultrasounds. FINDINGS: Uterus Measurements: 7.9 x 6.0 x 5.9 cm = volume: 150 mL. No fibroids or other mass visualized. Endometrium Thickness: 8.2 mm. Filled with hypoechoic fluid. No internal blood flow seen with color Doppler. Right ovary Measurements: 3.2 x 2.0 x 1.6 cm = volume: 3.4 mL. Normal appearance/no adnexal mass. Left ovary Measurements: 2.7 x 2.1 x 1.3 cm = volume:  4.1 mL. Normal appearance/no adnexal mass. Pulsed Doppler evaluation of both ovaries demonstrates normal low-resistance arterial and venous waveforms. Other findings No abnormal free fluid. IMPRESSION: 1. Hypoechoic fluid filling the endometrial cavity. This could represent blood. Infected fluid associated with endometritis is a possibility. Retained products of conception is unlikely since this has no internal blood flow with color Doppler. 2. Otherwise, normal examination. Electronically Signed   By: Claudie Revering M.D.   On: 04/23/2018 03:06   US Abdomen Limited Ruq  Result Date: 04/23/2018 CLINICAL DATA:  Elevated liver function tests. Vaginal delivery 5 weeks ago. EXAM: ULTRASOUND ABDOMEN LIMITED RIGHT UPPER QUADRANT COMPARISON:  None. FINDINGS: Gallbladder: Multiple small gallstones measuring up to 6 mm in maximum diameter each. Poorly distended gallbladder with mild diffuse wall thickening measuring 3.3 mm in maximum thickness. There is also a suggestion minimal edema in the gallbladder wall with no pericholecystic fluid. No sonographic Murphy sign. Common bile duct: Diameter: 3.2 mm Liver: No focal lesion identified. Within normal limits in parenchymal echogenicity. Portal vein is patent on color Doppler imaging with normal direction of blood flow towards the liver. IMPRESSION: 1. Cholelithiasis. 2. Poorly distended gallbladder with minimal wall thickening and questionable minimal wall edema with no other secondary signs of acute cholecystitis. 3. Otherwise, normal examination.  No biliary obstruction Electronically Signed   By: Claudie Revering M.D.   On: 04/23/2018 03:12    Procedures Procedures (including critical care time)  Medications Ordered in ED Medications - No data to display   Initial Impression / Assessment and Plan / ED Course  I have reviewed the triage vital signs and the nursing notes.  Pertinent labs & imaging results that were available during my care of the patient were  reviewed by me and considered in my medical decision making (see chart for details).     Patient with a 10-day history of left lower quadrant pain and suprapubic pain.  She has had some urinary frequency.  Urine is negative for infection.  CBC shows hemoglobin 11.0.  CMP shows elevation in LFTs, AST 86, ALT 111.  This is new for the patient.  Right upper quadrant ultrasound shows cholelithiasis and poorly distended gallbladder with minimal wall thickening and questionable minimal wall edema with no secondary signs of acute cholecystitis.  Patient has no right upper quadrant tenderness.  Pelvic ultrasound shows hypoechoic fluid filling the endometrial cavity, could represent blood, infected fluid associated with endometritis is a possibility, retained products of conception unlikely since no internal blood flow with color Doppler.  I discussed patient case with OB/GYN on-call, Dr. Ilda Basset, who advised that patient symptoms are unlikely related to recent pregnancy or for gynecologic.  GC/chlamydia negative.  Wet prep shows many WBCs.  Low suspicion for kidney stone as patient is negative for hematuria.  Possible musculoskeletal cause.  Will treat patient symptomatically and refer to PCP and OB/GYN for 6-week post partum for further work-up.  Return precautions discussed.  Patient understands and agrees with plan.  Patient vital stable throughout ED course and discharged in satisfactory condition. I discussed patient case with Dr. Leonides Schanz who guided the patient's management and agrees with plan.   Final Clinical Impressions(s) / ED Diagnoses   Final diagnoses:  Elevated LFTs  Left lower quadrant abdominal pain    ED Discharge Orders    None       Frederica Kuster, PA-C 04/24/18 Millen, Delice Bison, DO 04/28/18 [id number]

## 2018-04-29 ENCOUNTER — Ambulatory Visit (INDEPENDENT_AMBULATORY_CARE_PROVIDER_SITE_OTHER): Payer: Self-pay | Admitting: Obstetrics and Gynecology

## 2018-04-29 VITALS — BP 119/77 | HR 77

## 2018-04-29 DIAGNOSIS — R350 Frequency of micturition: Secondary | ICD-10-CM

## 2018-04-29 DIAGNOSIS — K5900 Constipation, unspecified: Secondary | ICD-10-CM

## 2018-04-29 LAB — POCT URINALYSIS DIP (DEVICE)
Bilirubin Urine: NEGATIVE
Glucose, UA: NEGATIVE mg/dL
Hgb urine dipstick: NEGATIVE
Ketones, ur: NEGATIVE mg/dL
Nitrite: NEGATIVE
Protein, ur: NEGATIVE mg/dL
Specific Gravity, Urine: 1.025 (ref 1.005–1.030)
UROBILINOGEN UA: 0.2 mg/dL (ref 0.0–1.0)
pH: 5.5 (ref 5.0–8.0)

## 2018-04-29 MED ORDER — DOCUSATE SODIUM 100 MG PO CAPS: 100.0000 mg | ORAL_CAPSULE | Freq: Two times a day (BID) | ORAL | 0 refills | Status: DC

## 2018-04-29 MED ORDER — POLYETHYLENE GLYCOL 3350 17 GM/SCOOP PO POWD: 1.0000 | Freq: Once | ORAL | Status: AC

## 2018-04-29 NOTE — Progress Notes (Signed)
Subjective:     Kari Ortega is a 37 y.o. female who presents for a postpartum visit. She is 6 weeks postpartum following a spontaneous vaginal delivery. I have fully reviewed the prenatal and intrapartum course. The delivery was at 28 gestational weeks. Outcome: spontaneous vaginal delivery. Anesthesia: none. Postpartum course has been complicated by constipation. Last BM was yesterday however it was small and firm.  Baby's course has been uncomplicated.  Baby is feeding by breast. Bleeding no bleeding. Bowel function is abnormal: constipated. Bladder function is abnormal: urgency and frequency. Patient is not sexually active. Contraception method is none. Postpartum depression screening: negative.  The following portions of the patient's history were reviewed and updated as appropriate: allergies, current medications, past family history, past medical history, past social history, past surgical history and problem list.  Review of Systems Pertinent items are noted in HPI.   Objective:    BP 119/77   Pulse 77   General:  alert, cooperative and appears stated age   Breasts:  inspection negative, no nipple discharge or bleeding, no masses or nodularity palpable  Lungs: clear to auscultation bilaterally  Heart:  regular rate and rhythm, S1, S2 normal, no murmur, click, rub or gallop  Abdomen: abnormal findings:  LLQ tenderness with deep palpation    Vulva:  not evaluated  Vagina: not evaluated  Corpus: not examined  Adnexa:  not evaluated  Rectal Exam: Not performed.   Assessment:   Normal postpartum exam. Pap smear not done at today's visit. Last pap was normal in 2018 Urine frequency Constipation   Plan:   1. Contraception: none; declines  2. Miralax clean out  3. Follow up  or as needed.    Lezlie Lye, NP 05/01/2018 8:41 AM

## 2018-04-29 NOTE — Patient Instructions (Signed)

## 2018-04-30 LAB — URINE CULTURE: Organism ID, Bacteria: NO GROWTH

## 2018-05-06 ENCOUNTER — Ambulatory Visit (INDEPENDENT_AMBULATORY_CARE_PROVIDER_SITE_OTHER): Payer: Self-pay

## 2018-05-06 DIAGNOSIS — Z23 Encounter for immunization: Secondary | ICD-10-CM

## 2018-05-06 NOTE — Progress Notes (Signed)
Kari Ortega here for flu vaccine  Injection.  Injection administered without complication.  Video Interpreter # [id number] pt c/o her tail bone hurting when she sits for long periods of time.  I encouraged pt to make sure that she is ambulating that it will also help with her recovery.  Pt stated understanding.  Verdell Carmine, RN 05/06/2018  2:24 PM

## 2018-05-06 NOTE — Progress Notes (Signed)
Chart reviewed for nurse visit. Agree with plan of care.   [place]arr [place], [place] 05/06/2018 4:51 PM

## 2018-06-11 ENCOUNTER — Ambulatory Visit: Payer: Self-pay | Attending: Family Medicine

## 2018-06-13 ENCOUNTER — Emergency Department (HOSPITAL_COMMUNITY): Payer: Medicaid Other

## 2018-06-13 ENCOUNTER — Inpatient Hospital Stay (HOSPITAL_COMMUNITY)
Admission: EM | Admit: 2018-06-13 | Discharge: 2018-06-14 | DRG: 419 | Disposition: A | Discharge status: Home / Self Care | Payer: Medicaid Other | Attending: General Surgery | Admitting: General Surgery

## 2018-06-13 ENCOUNTER — Encounter (HOSPITAL_COMMUNITY): Payer: Self-pay | Admitting: Emergency Medicine

## 2018-06-13 DIAGNOSIS — K8020 Calculus of gallbladder without cholecystitis without obstruction: Secondary | ICD-10-CM | POA: Diagnosis present

## 2018-06-13 DIAGNOSIS — K5900 Constipation, unspecified: Secondary | ICD-10-CM | POA: Diagnosis present

## 2018-06-13 DIAGNOSIS — K8010 Calculus of gallbladder with chronic cholecystitis without obstruction: Secondary | ICD-10-CM | POA: Diagnosis present

## 2018-06-13 DIAGNOSIS — R945 Abnormal results of liver function studies: Secondary | ICD-10-CM

## 2018-06-13 DIAGNOSIS — K8080 Other cholelithiasis without obstruction: Secondary | ICD-10-CM

## 2018-06-13 DIAGNOSIS — R1011 Right upper quadrant pain: Secondary | ICD-10-CM

## 2018-06-13 DIAGNOSIS — E669 Obesity, unspecified: Secondary | ICD-10-CM | POA: Diagnosis present

## 2018-06-13 DIAGNOSIS — Z6836 Body mass index (BMI) 36.0-36.9, adult: Secondary | ICD-10-CM | POA: Diagnosis not present

## 2018-06-13 DIAGNOSIS — R7989 Other specified abnormal findings of blood chemistry: Secondary | ICD-10-CM

## 2018-06-13 DIAGNOSIS — R29810 Facial weakness: Secondary | ICD-10-CM | POA: Diagnosis present

## 2018-06-13 LAB — URINALYSIS, ROUTINE W REFLEX MICROSCOPIC
BACTERIA UA: NONE SEEN
Bilirubin Urine: NEGATIVE
Glucose, UA: NEGATIVE mg/dL
HGB URINE DIPSTICK: NEGATIVE
Ketones, ur: NEGATIVE mg/dL
NITRITE: NEGATIVE
Protein, ur: NEGATIVE mg/dL
Specific Gravity, Urine: 1.041 — ABNORMAL HIGH (ref 1.005–1.030)
pH: 7 (ref 5.0–8.0)

## 2018-06-13 LAB — CBC
HEMATOCRIT: 39.2 % (ref 36.0–46.0)
HEMATOCRIT: 38.2 % (ref 36.0–46.0)
Hemoglobin: 12.2 g/dL (ref 12.0–15.0)
Hemoglobin: 12.3 g/dL (ref 12.0–15.0)
MCH: 26.5 pg (ref 26.0–34.0)
MCH: 27 pg (ref 26.0–34.0)
MCHC: 31.1 g/dL (ref 30.0–36.0)
MCHC: 32.2 g/dL (ref 30.0–36.0)
MCV: 85 fL (ref 80.0–100.0)
MCV: 84 fL (ref 80.0–100.0)
Platelets: 294 10*3/uL (ref 150–400)
Platelets: 267 10*3/uL (ref 150–400)
RBC: 4.61 MIL/uL (ref 3.87–5.11)
RBC: 4.55 MIL/uL (ref 3.87–5.11)
RDW: 13.5 % (ref 11.5–15.5)
RDW: 13.5 % (ref 11.5–15.5)
WBC: 12.2 10*3/uL — ABNORMAL HIGH (ref 4.0–10.5)
WBC: 7.8 10*3/uL (ref 4.0–10.5)
nRBC: 0 % (ref 0.0–0.2)
nRBC: 0 % (ref 0.0–0.2)

## 2018-06-13 LAB — CREATININE, SERUM
Creatinine, Ser: 0.56 mg/dL (ref 0.44–1.00)
GFR calc Af Amer: >60 mL/min (ref >60)
GFR calc non Af Amer: >60 mL/min (ref >60)

## 2018-06-13 LAB — COMPREHENSIVE METABOLIC PANEL
ALT: 128 U/L — ABNORMAL HIGH (ref 0–44)
AST: 163 U/L — ABNORMAL HIGH (ref 15–41)
Albumin: 3.5 g/dL (ref 3.5–5.0)
Alkaline Phosphatase: 132 U/L — ABNORMAL HIGH (ref 38–126)
Anion gap: 10 (ref 5–15)
BUN: 14 mg/dL (ref 6–20)
CO2: 24 mmol/L (ref 22–32)
Calcium: 9.8 mg/dL (ref 8.9–10.3)
Chloride: 106 mmol/L (ref 98–111)
Creatinine, Ser: 0.77 mg/dL (ref 0.44–1.00)
GFR calc Af Amer: >60 mL/min (ref >60)
Glucose, Bld: 127 mg/dL — ABNORMAL HIGH (ref 70–99)
Potassium: 3.6 mmol/L (ref 3.5–5.1)
Sodium: 140 mmol/L (ref 135–145)
Total Bilirubin: 0.6 mg/dL (ref 0.3–1.2)
Total Protein: 7.8 g/dL (ref 6.5–8.1)

## 2018-06-13 LAB — LIPASE, BLOOD: Lipase: 42 U/L (ref 11–51)

## 2018-06-13 LAB — I-STAT TROPONIN, ED: Troponin i, poc: 0 ng/mL (ref 0.00–0.08)

## 2018-06-13 LAB — SURGICAL PCR SCREEN
MRSA, PCR: NEGATIVE
Staphylococcus aureus: NEGATIVE

## 2018-06-13 LAB — I-STAT BETA HCG BLOOD, ED (MC, WL, AP ONLY): I-stat hCG, quantitative: <5 m[IU]/mL (ref <5)

## 2018-06-13 MED ORDER — HYDRALAZINE HCL 20 MG/ML IJ SOLN: 10.0000 mg | INTRAMUSCULAR | Status: DC | PRN

## 2018-06-13 MED ORDER — DOCUSATE SODIUM 100 MG PO CAPS
100.0000 mg | ORAL_CAPSULE | Freq: Two times a day (BID) | ORAL | Status: DC
  Administered 2018-06-13: 100 mg via ORAL
  Filled 2018-06-13: qty 1

## 2018-06-13 MED ORDER — SODIUM CHLORIDE 0.9% FLUSH: 3.0000 mL | Freq: Once | INTRAVENOUS | Status: DC

## 2018-06-13 MED ORDER — SODIUM CHLORIDE 0.9 % IV SOLN
2.0000 g | INTRAVENOUS | Status: DC
  Administered 2018-06-13: 2 g via INTRAVENOUS
  Filled 2018-06-13 (×2): qty 20

## 2018-06-13 MED ORDER — ONDANSETRON HCL 4 MG/2ML IJ SOLN
4.0000 mg | Freq: Four times a day (QID) | INTRAMUSCULAR | Status: DC | PRN
  Administered 2018-06-13: 4 mg via INTRAVENOUS
  Filled 2018-06-13: qty 2

## 2018-06-13 MED ORDER — MORPHINE SULFATE (PF) 4 MG/ML IV SOLN
4.0000 mg | Freq: Once | INTRAVENOUS | Status: AC
  Administered 2018-06-13: 4 mg via INTRAVENOUS
  Filled 2018-06-13: qty 1

## 2018-06-13 MED ORDER — ENOXAPARIN SODIUM 40 MG/0.4ML ~~LOC~~ SOLN
40.0000 mg | SUBCUTANEOUS | Status: DC
  Filled 2018-06-13: qty 0.4

## 2018-06-13 MED ORDER — SODIUM CHLORIDE 0.9 % IV SOLN
INTRAVENOUS | Status: DC
  Administered 2018-06-13: 14:00:00 via INTRAVENOUS

## 2018-06-13 MED ORDER — IOHEXOL 300 MG/ML  SOLN
100.0000 mL | Freq: Once | INTRAMUSCULAR | Status: AC | PRN
  Administered 2018-06-13: 100 mL via INTRAVENOUS

## 2018-06-13 MED ORDER — ONDANSETRON 4 MG PO TBDP: 4.0000 mg | ORAL_TABLET | Freq: Four times a day (QID) | ORAL | Status: DC | PRN

## 2018-06-13 MED ORDER — ACETAMINOPHEN 650 MG RE SUPP: 650.0000 mg | Freq: Four times a day (QID) | RECTAL | Status: DC | PRN

## 2018-06-13 MED ORDER — DIPHENHYDRAMINE HCL 50 MG/ML IJ SOLN: 25.0000 mg | Freq: Four times a day (QID) | INTRAMUSCULAR | Status: DC | PRN

## 2018-06-13 MED ORDER — ONDANSETRON HCL 4 MG/2ML IJ SOLN
4.0000 mg | Freq: Once | INTRAMUSCULAR | Status: AC
  Administered 2018-06-13: 4 mg via INTRAVENOUS
  Filled 2018-06-13: qty 2

## 2018-06-13 MED ORDER — SODIUM CHLORIDE 0.9 % IV BOLUS
1000.0000 mL | Freq: Once | INTRAVENOUS | Status: AC
  Administered 2018-06-13: 1000 mL via INTRAVENOUS

## 2018-06-13 MED ORDER — DIPHENHYDRAMINE HCL 25 MG PO CAPS: 25.0000 mg | ORAL_CAPSULE | Freq: Four times a day (QID) | ORAL | Status: DC | PRN

## 2018-06-13 MED ORDER — OXYCODONE HCL 5 MG PO TABS: 5.0000 mg | ORAL_TABLET | ORAL | Status: DC | PRN

## 2018-06-13 MED ORDER — ACETAMINOPHEN 325 MG PO TABS
650.0000 mg | ORAL_TABLET | Freq: Four times a day (QID) | ORAL | Status: DC | PRN
  Administered 2018-06-13: 650 mg via ORAL
  Filled 2018-06-13: qty 2

## 2018-06-13 MED ORDER — MORPHINE SULFATE (PF) 2 MG/ML IV SOLN
2.0000 mg | INTRAVENOUS | Status: DC | PRN
  Administered 2018-06-14: 2 mg via INTRAVENOUS
  Filled 2018-06-13: qty 1

## 2018-06-13 NOTE — ED Notes (Signed)
Attempted to call report x 1  

## 2018-06-13 NOTE — ED Triage Notes (Signed)
Reports RUQ abdominal pain that radiates into back and epigastric burning that started tonight.  Has had 3 similar episodes in the past.  Also c/o n/v small amount.

## 2018-06-13 NOTE — H&P (Signed)
[facility] Consult/Admission Note  Kari Ortega 09/05/1980  [id number].    Requesting MD: Mina Fawze, PA_C Chief Complaint/Reason for Consult: symptomatic cholelithiasis  HPI:   Pt is a spanish speaking 37 yo with a hx of L sided facial paralysis, postpartum who is currently breast feeding who presented to the ED with complaints of RUQ pain, acute onset last night, severe, constant, worse with deep breaths and radiating into her back. Associated nausea and vomiting. Hx of constipation but no blood in her stools. No urinary symptoms. She denies blood in urine or dysuria. No fever or chills. No other associated symptoms. No hx of abdominal surgeries, no anticoagulation and no daily medications. She states this is the 4th episode of these symptoms and the last episode was roughly one month ago.   ROS:  Review of Systems  Constitutional: Negative for chills, diaphoresis and fever.  HENT: Negative for sore throat.   Respiratory: Negative for cough and shortness of breath.   Cardiovascular: Negative for chest pain.  Gastrointestinal: Positive for abdominal pain, nausea and vomiting. Negative for blood in stool, constipation and diarrhea.  Genitourinary: Negative for dysuria and hematuria.  Musculoskeletal: Positive for back pain.  Skin: Negative for rash.  Neurological: Negative for dizziness and loss of consciousness.  All other systems reviewed and are negative.    Family History  Problem Relation Age of Onset  . Diabetes Mother     Past Medical History:  Diagnosis Date  . Anemia   . 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  Unable to determine reason per pt.    . Irregular periods     Past Surgical History:  Procedure Laterality Date  . TOOTH EXTRACTION      Social History:  reports that she has never smoked. She has never used smokeless tobacco. She reports that she does not drink alcohol or use drugs.  Allergies: No  Known Allergies  (Not in a hospital admission)   Blood pressure 110/68, pulse (!) 58, temperature 97.8 F (36.6 C), temperature source Oral, resp. rate 14, height 4' 9" (1.448 m), weight 77.4 kg, SpO2 98 %, currently breastfeeding.  Physical Exam Vitals signs and nursing note reviewed.  Constitutional:      General: She is not in acute distress.    Appearance: Normal appearance. She is overweight. She is not diaphoretic.  HENT:     Head: Normocephalic and atraumatic.     Nose: Nose normal.     Mouth/Throat:     Lips: Pink.     Mouth: Mucous membranes are moist.     Tongue: No lesions.     Pharynx: Oropharynx is clear. No oropharyngeal exudate.  Eyes:     General: No scleral icterus.       Right eye: No discharge.        Left eye: No discharge.     Conjunctiva/sclera: Conjunctivae normal.     Pupils: Pupils are equal, round, and reactive to light.  Neck:     Musculoskeletal: Normal range of motion and neck supple.     Thyroid: No thyromegaly.  Cardiovascular:     Rate and Rhythm: Normal rate and regular rhythm.     Pulses:          Radial pulses are 2+ on the right side and 2+ on the left side.       Dorsalis pedis pulses are 2+ on the right side and 2+ on the left side.       Heart sounds: Normal heart sounds. No murmur.  Pulmonary:     Effort: Pulmonary effort is normal. No respiratory distress.     Breath sounds: Normal breath sounds. No wheezing, rhonchi or rales.  Abdominal:     General: Bowel sounds are normal. There is no distension.     Palpations: Abdomen is soft. Abdomen is not rigid.     Tenderness: There is abdominal tenderness in the right upper quadrant. There is no guarding. Negative signs include Murphy's sign.       Comments: Scar just below umbilicus pt states was from previous accident where a piece of glass was in her skin, she denies surgery  Musculoskeletal: Normal range of motion.        General: No tenderness or deformity.  Skin:    General: Skin  is warm and dry.     Findings: No rash.  Neurological:     Mental Status: She is alert and oriented to person, place, and time.     GCS: GCS eye subscore is 4. GCS verbal subscore is 5. GCS motor subscore is 6.     Cranial Nerves: Facial asymmetry (L sided facial droop pt states is chronic) present.  Psychiatric:        Mood and Affect: Mood normal.        Behavior: Behavior normal.     Results for orders placed or performed during the hospital encounter of 06/13/18 (from the past 48 hour(s))  Lipase, blood     Status: None   Collection Time: 06/13/18  6:00 AM  Result Value Ref Range   Lipase 42 11 - 51 U/L    Comment: Performed at [facility], [address] [place], [place] [address]  Comprehensive metabolic panel     Status: Abnormal   Collection Time: 06/13/18  6:00 AM  Result Value Ref Range   Sodium 140 135 - 145 mmol/L   Potassium 3.6 3.5 - 5.1 mmol/L   Chloride 106 98 - 111 mmol/L   CO2 24 22 - 32 mmol/L   Glucose, Bld 127 (H) 70 - 99 mg/dL   BUN 14 6 - 20 mg/dL   Creatinine, Ser 0.77 0.44 - 1.00 mg/dL   Calcium 9.8 8.9 - 10.3 mg/dL   Total Protein 7.8 6.5 - 8.1 g/dL   Albumin 3.5 3.5 - 5.0 g/dL   AST 163 (H) 15 - 41 U/L   ALT 128 (H) 0 - 44 U/L   Alkaline Phosphatase 132 (H) 38 - 126 U/L   Total Bilirubin 0.6 0.3 - 1.2 mg/dL   GFR calc non Af Amer >60 >60 mL/min   GFR calc Af Amer >60 >60 mL/min   Anion gap 10 5 - 15    Comment: Performed at [facility], [address] [place], [place] [address]  CBC     Status: Abnormal   Collection Time: 06/13/18  6:00 AM  Result Value Ref Range   WBC 12.2 (H) 4.0 - 10.5 K/uL   RBC 4.61 3.87 - 5.11 MIL/uL   Hemoglobin 12.2 12.0 - 15.0 g/dL   HCT 39.2 36.0 - 46.0 %   MCV 85.0 80.0 - 100.0 fL   MCH 26.5 26.0 - 34.0 pg   MCHC 31.1 30.0 - 36.0 g/dL   RDW 13.5 11.5 - 15.5 %   Platelets 294 150 - 400 K/uL   nRBC 0.0 0.0 - 0.2 %    Comment: Performed at [facility], [address] [place], [place]  [address]  I-Stat beta hCG blood, ED     Status: None   Collection Time: 06/13/18  6:14 AM  Result Value Ref Range   I-stat hCG, quantitative <5.0 <5 mIU/mL   Comment 3            Comment:   GEST. AGE      CONC.  (mIU/mL)   <=1 WEEK        5 - 50     2 WEEKS       50 - 500     3 WEEKS       100 - 10,000     4 WEEKS     1,000 - 30,000        FEMALE AND NON-PREGNANT FEMALE:     LESS THAN 5 mIU/mL   I-Stat Troponin, ED (not at [facility])     Status: None   Collection Time: 06/13/18  6:18 AM  Result Value Ref Range   Troponin i, poc 0.00 0.00 - 0.08 ng/mL   Comment 3            Comment: Due to the release kinetics of cTnI, a negative result within the first hours of the onset of symptoms does not rule out myocardial infarction with certainty. If myocardial infarction is still suspected, repeat the test at appropriate intervals.   Urinalysis, Routine w reflex microscopic     Status: Abnormal   Collection Time: 06/13/18  9:12 AM  Result Value Ref Range   Color, Urine YELLOW YELLOW   APPearance CLEAR CLEAR   Specific Gravity, Urine 1.041 (H) 1.005 - 1.030   pH 7.0 5.0 - 8.0   Glucose, UA NEGATIVE NEGATIVE mg/dL   Hgb urine dipstick NEGATIVE NEGATIVE   Bilirubin Urine NEGATIVE NEGATIVE   Ketones, ur NEGATIVE NEGATIVE mg/dL   Protein, ur NEGATIVE NEGATIVE mg/dL   Nitrite NEGATIVE NEGATIVE   Leukocytes, UA MODERATE (A) NEGATIVE   RBC / HPF 0-5 0 - 5 RBC/hpf   WBC, UA 11-20 0 - 5 WBC/hpf   Bacteria, UA NONE SEEN NONE SEEN   Squamous Epithelial / LPF 0-5 0 - 5   Mucus PRESENT     Comment: Performed at [facility], [address] [place], [place] [address]   Ct Abdomen Pelvis W Contrast  Result Date: 06/13/2018 CLINICAL DATA:  Right upper quadrant pain, nausea/vomiting, cholelithiasis EXAM: CT ABDOMEN AND PELVIS WITH CONTRAST TECHNIQUE: Multidetector CT imaging of the abdomen and pelvis was performed using the standard protocol following bolus administration of intravenous contrast.  CONTRAST:  100mL OMNIPAQUE IOHEXOL 300 MG/ML  SOLN COMPARISON:  Right upper quadrant ultrasound dated 06/13/2018 FINDINGS: Lower chest: Mild dependent atelectasis in the bilateral lower lobes. Hepatobiliary: Liver is within normal limits. Gallbladder is notable for layering tiny gallstones (series 3/image 26), without associated inflammatory changes. No intrahepatic or extrahepatic ductal dilatation. Pancreas: Within normal limits. Spleen: Within normal limits. Adrenals/Urinary Tract: Adrenal glands are within normal limits. Kidneys are within normal limits.  No hydronephrosis. Bladder is within normal limits. Stomach/Bowel: Stomach is within normal limits. No evidence of bowel obstruction. Normal appendix (series 3/image 48). Vascular/Lymphatic: No evidence of abdominal aortic aneurysm. No suspicious abdominopelvic lymphadenopathy. Reproductive: Retroverted uterus. Bilateral ovaries are within normal limits. Other: No abdominopelvic ascites. Musculoskeletal: Visualized osseous structures are within normal limits. IMPRESSION: Mild cholelithiasis, without associated inflammatory changes. No evidence of bowel obstruction.  Normal appendix. No CT findings to account for the patient's right abdominal pain.   Electronically Signed   By: Julian Hy M.D.   On: 06/13/2018 08:38   US Abdomen Limited Ruq  Result Date: 06/13/2018 CLINICAL DATA:  Right upper quadrant abdominal pain. EXAM: ULTRASOUND ABDOMEN LIMITED RIGHT UPPER QUADRANT COMPARISON:  Ultrasound of April 23, 2018. FINDINGS: Gallbladder: Multiple gallstones are noted with the largest measuring 8 mm. No gallbladder wall thickening or pericholecystic fluid is noted. No sonographic Murphy's sign is noted. Common bile duct: Diameter: 4 mm which is within normal limits. Liver: No focal lesion identified. Within normal limits in parenchymal echogenicity. Portal vein is patent on color Doppler imaging with normal direction of blood flow towards the liver.  IMPRESSION: Cholelithiasis without inflammation. No other abnormality seen in the right upper quadrant of the abdomen. Electronically Signed   By: Marijo Conception, M.D.   On: 06/13/2018 07:29      Assessment/Plan Active Problems:   * No active hospital problems. *  Hx of L sided facial paralysis  Symptomatic cholelithiasis possible cholecystitis - LFTs and alk phos elevated, Tbili WNL - US showed cholelithiasis without signs of cholecystitis  - WBC 12.2 - OR tomorrow for lap chole, timing TBD  UA with mucus and leukocytes - culture pending  FEN: CLD, NPO at midnight VTE: SCD's, lovenox ID: Rocephin Foley: none Follow up: TBD  Plan: admit, NPO at midnight, OR tomorrow. Pt knows to pump and dump after taking narcotic pain medicines. I discussed this at length with her. Discussed Rocephin with pharmacist and this is okay with breastfeeding.    Kalman Drape, [facility] 06/13/2018, 11:01 AM Pager: [phone] Consults: [phone] Mon-Fri 7:00 am-4:30 pm Sat-Sun 7:00 am-11:30 am

## 2018-06-13 NOTE — ED Notes (Signed)
Attempted to call report x2

## 2018-06-13 NOTE — ED Notes (Signed)
Attempted to call report x 3. 

## 2018-06-13 NOTE — ED Provider Notes (Signed)
[facility] EMERGENCY DEPARTMENT Provider Note   CSN: [id number] Arrival date & time: 06/13/18  9179     History   Chief Complaint Chief Complaint  Patient presents with  . Abdominal Pain    HPI Kari Ortega is a 38 y.o. female with history of persistent facial paralysis, anemia presents for evaluation of acute onset, progressively worsening right upper quadrant abdominal pain beginning sometime last night.  Reports that pain is constant, describes as a severe pressure, radiates to the epigastric region, around to the back, and up the midline.  No aggravating or alleviating factors noted.  She has had a few episodes of nonbloody nonbilious emesis and notes persistent nausea.  She also notes some constipation, denies melena, hematochezia, or urinary symptoms.  No fevers or chills. She does feel somewhat short of breath when the pain worsens, denies chest pain.No recent travel or surgeries, no hemoptysis, no prior history of DVT or PE, and she is not on OCPs.  She is primarily Spanish-speaking and a Optometrist was used throughout the encounter.  The history is provided by the patient.    Past Medical History:  Diagnosis Date  . Anemia   . 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  Unable to determine reason per pt.    . Irregular periods     Patient Active Problem List   Diagnosis Date Noted  . Symptomatic cholelithiasis 06/13/2018  . Postpartum care and examination 04/29/2018  . Urine frequency 04/29/2018  . Normal labor 03/18/2018  . 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 BMI 30-39.9 09/21/2017  . Obesity in pregnancy 09/21/2017  . Language barrier 09/21/2017  . Supervision of high risk pregnancy, antepartum 08/24/2017  . AMA (advanced maternal age) multigravida 35+ 08/24/2017  . HYPERTRIGLYCERIDEMIA 12/15/2006    Past Surgical History:  Procedure Laterality Date  . TOOTH EXTRACTION       OB History    Gravida  5     Para  3   Term  3   Preterm      AB  2   Living  3     SAB  1   TAB      Ectopic  1   Multiple  0   Live Births  3            Home Medications    Prior to Admission medications   Medication Sig Start Date End Date Taking? Authorizing Provider  docusate sodium (COLACE) 100 MG capsule Take 1 capsule (100 mg total) by mouth 2 (two) times daily. 04/29/18  Yes Rasch, Artist Pais, NP  Prenatal Vit-Fe Fumarate-FA (MULTIVITAMIN-PRENATAL) 27-0.8 MG TABS tablet Take 1 tablet by mouth daily at 12 noon.   Yes [provider]  cyclobenzaprine (FLEXERIL) 10 MG tablet Take 1 tablet (10 mg total) by mouth 2 (two) times daily as needed for muscle spasms. Patient not taking: Reported on 04/29/2018 01/12/18   Darlina Rumpf, CNM  famotidine (PEPCID) 20 MG tablet Take 1 tablet (20 mg total) by mouth 2 (two) times daily. Patient not taking: Reported on 04/29/2018 10/13/17   Lajean Manes, CNM  ferrous sulfate 325 (65 FE) MG tablet Take 1 tablet (325 mg total) by mouth daily. Patient not taking: Reported on 04/29/2018 03/20/18 04/23/26  Patriciaann Clan, DO  ibuprofen (ADVIL,MOTRIN) 600 MG tablet Take 1 tablet (600 mg total) by mouth every 6 (six) hours. Patient not taking: Reported on 04/29/2018 03/20/18  Patriciaann Clan, DO  predniSONE (DELTASONE) 20 MG tablet Take 2 tablets (40 mg total) by mouth daily with breakfast. For the next four days Patient not taking: Reported on 04/29/2018 04/01/18   Carmin Muskrat, MD  senna-docusate (SENOKOT-S) 8.6-50 MG tablet Take 2 tablets by mouth daily. Patient not taking: Reported on 04/23/2018 03/21/18   Patriciaann Clan, DO    Family History Family History  Problem Relation Age of Onset  . Diabetes Mother     Social History Social History   Tobacco Use  . Smoking status: Never Smoker  . Smokeless tobacco: Never Used  Substance Use Topics  . Alcohol use: No  . Drug use: No     Allergies   Patient has no known  allergies.   Review of Systems Review of Systems  Constitutional: Negative for chills and fever.  Cardiovascular: Negative for chest pain.  Gastrointestinal: Positive for abdominal pain, constipation, nausea and vomiting. Negative for diarrhea.  Genitourinary: Negative for dysuria, frequency, urgency, vaginal bleeding, vaginal discharge and vaginal pain.  All other systems reviewed and are negative.    Physical Exam Updated Vital Signs BP 110/68   Pulse (!) 58   Temp 97.8 F (36.6 C) (Oral)   Resp 14   Ht 4\' 9"  (1.448 m)   Wt 77.4 kg   SpO2 98%   BMI 36.93 kg/m   Physical Exam Vitals signs and nursing note reviewed.  Constitutional:      General: She is not in acute distress.    Appearance: She is well-developed.  HENT:     Head: Normocephalic and atraumatic.  Eyes:     General:        Right eye: No discharge.        Left eye: No discharge.     Conjunctiva/sclera: Conjunctivae normal.  Neck:     Vascular: No JVD.     Trachea: No tracheal deviation.  Cardiovascular:     Rate and Rhythm: Normal rate.     Heart sounds: Normal heart sounds.  Pulmonary:     Effort: Pulmonary effort is normal.     Breath sounds: Normal breath sounds.  Abdominal:     General: There is no distension.     Tenderness: There is abdominal tenderness in the right upper quadrant. There is no right CVA tenderness, left CVA tenderness, guarding or rebound. Positive signs include Murphy's sign.  Genitourinary:    Comments: Deferred Skin:    General: Skin is warm and dry.     Findings: No erythema.  Neurological:     Mental Status: She is alert.     Comments: Chronic left-sided facial droop  Psychiatric:        Behavior: Behavior normal.      ED Treatments / Results  Labs (all labs ordered are listed, but only abnormal results are displayed) Labs Reviewed  COMPREHENSIVE METABOLIC PANEL - Abnormal; Notable for the following components:      Result Value   Glucose, Bld 127 (*)     AST 163 (*)    ALT 128 (*)    Alkaline Phosphatase 132 (*)    All other components within normal limits  CBC - Abnormal; Notable for the following components:   WBC 12.2 (*)    All other components within normal limits  URINALYSIS, ROUTINE W REFLEX MICROSCOPIC - Abnormal; Notable for the following components:   Specific Gravity, Urine 1.041 (*)    Leukocytes, UA MODERATE (*)    All other components  within normal limits  LIPASE, BLOOD  CBC  CREATININE, SERUM  I-STAT BETA HCG BLOOD, ED ([facility], [facility], [facility] ONLY)  I-STAT TROPONIN, ED    EKG None  Radiology Ct Abdomen Pelvis W Contrast  Result Date: 06/13/2018 CLINICAL DATA:  Right upper quadrant pain, nausea/vomiting, cholelithiasis EXAM: CT ABDOMEN AND PELVIS WITH CONTRAST TECHNIQUE: Multidetector CT imaging of the abdomen and pelvis was performed using the standard protocol following bolus administration of intravenous contrast. CONTRAST:  189mL OMNIPAQUE IOHEXOL 300 MG/ML  SOLN COMPARISON:  Right upper quadrant ultrasound dated 06/13/2018 FINDINGS: Lower chest: Mild dependent atelectasis in the bilateral lower lobes. Hepatobiliary: Liver is within normal limits. Gallbladder is notable for layering tiny gallstones (series 3/image 26), without associated inflammatory changes. No intrahepatic or extrahepatic ductal dilatation. Pancreas: Within normal limits. Spleen: Within normal limits. Adrenals/Urinary Tract: Adrenal glands are within normal limits. Kidneys are within normal limits.  No hydronephrosis. Bladder is within normal limits. Stomach/Bowel: Stomach is within normal limits. No evidence of bowel obstruction. Normal appendix (series 3/image 48). Vascular/Lymphatic: No evidence of abdominal aortic aneurysm. No suspicious abdominopelvic lymphadenopathy. Reproductive: Retroverted uterus. Bilateral ovaries are within normal limits. Other: No abdominopelvic ascites. Musculoskeletal: Visualized osseous structures are within normal limits. IMPRESSION:  Mild cholelithiasis, without associated inflammatory changes. No evidence of bowel obstruction.  Normal appendix. No CT findings to account for the patient's right abdominal pain. Electronically Signed   By: Julian Hy M.D.   On: 06/13/2018 08:38   US Abdomen Limited Ruq  Result Date: 06/13/2018 CLINICAL DATA:  Right upper quadrant abdominal pain. EXAM: ULTRASOUND ABDOMEN LIMITED RIGHT UPPER QUADRANT COMPARISON:  Ultrasound of April 23, 2018. FINDINGS: Gallbladder: Multiple gallstones are noted with the largest measuring 8 mm. No gallbladder wall thickening or pericholecystic fluid is noted. No sonographic Murphy's sign is noted. Common bile duct: Diameter: 4 mm which is within normal limits. Liver: No focal lesion identified. Within normal limits in parenchymal echogenicity. Portal vein is patent on color Doppler imaging with normal direction of blood flow towards the liver. IMPRESSION: Cholelithiasis without inflammation. No other abnormality seen in the right upper quadrant of the abdomen. Electronically Signed   By: Marijo Conception, M.D.   On: 06/13/2018 07:29    Procedures Procedures (including critical care time)  Medications Ordered in ED Medications  sodium chloride flush (NS) 0.9 % injection 3 mL (has no administration in time range)  enoxaparin (LOVENOX) injection 40 mg (has no administration in time range)  0.9 %  sodium chloride infusion (has no administration in time range)  cefTRIAXone (ROCEPHIN) 2 g in sodium chloride 0.9 % 100 mL IVPB (has no administration in time range)  acetaminophen (TYLENOL) tablet 650 mg (has no administration in time range)    Or  acetaminophen (TYLENOL) suppository 650 mg (has no administration in time range)  oxyCODONE (Oxy IR/ROXICODONE) immediate release tablet 5-10 mg (has no administration in time range)  morphine 2 MG/ML injection 2 mg (has no administration in time range)  diphenhydrAMINE (BENADRYL) capsule 25 mg (has no administration in  time range)    Or  diphenhydrAMINE (BENADRYL) injection 25 mg (has no administration in time range)  docusate sodium (COLACE) capsule 100 mg (has no administration in time range)  ondansetron (ZOFRAN-ODT) disintegrating tablet 4 mg (has no administration in time range)    Or  ondansetron (ZOFRAN) injection 4 mg (has no administration in time range)  hydrALAZINE (APRESOLINE) injection 10 mg (has no administration in time range)  morphine 4 MG/ML injection 4 mg (  4 mg Intravenous Given 06/13/18 0652)  ondansetron (ZOFRAN) injection 4 mg (4 mg Intravenous Given 06/13/18 7116)  sodium chloride 0.9 % bolus 1,000 mL (0 mLs Intravenous Stopped 06/13/18 1035)  iohexol (OMNIPAQUE) 300 MG/ML solution 100 mL (100 mLs Intravenous Contrast Given 06/13/18 0820)     Initial Impression / Assessment and Plan / ED Course  I have reviewed the triage vital signs and the nursing notes.  Pertinent labs & imaging results that were available during my care of the patient were reviewed by me and considered in my medical decision making (see chart for details).     Patient presenting for evaluation of right upper quadrant abdominal pain with nausea and vomiting.  She is afebrile, vital signs are stable.  She is quite uncomfortable but nontoxic in appearance.  Lab work reviewed by me shows leukocytosis, elevated LFTs.  They have been elevated in the past but not to this degree.  UA does not suggest UTI or nephrolithiasis.  Lipase within normal limits as is creatinine.  CT scan of the abdomen and pelvis and right upper quadrant ultrasound show cholelithiasis without definitive sonographic or CT evidence of cholecystitis.  She has had some improvement in her pain on reassessment.  Spoke with Janett Billow, [place] with general surgery.  They will evaluate the patient and plan for admission and laparoscopic cholecystectomy tomorrow.  Final Clinical Impressions(s) / ED Diagnoses   Final diagnoses:  RUQ pain  Elevated LFTs  Biliary  calculus of other site without obstruction    ED Discharge Orders    None       Renita Papa, PA-C 06/13/18 1124    Orpah Greek, MD 06/14/18 [phone]

## 2018-06-14 ENCOUNTER — Encounter (HOSPITAL_COMMUNITY): Admission: EM | Disposition: A | Discharge status: Home / Self Care | Payer: Self-pay | Source: Home / Self Care

## 2018-06-14 ENCOUNTER — Encounter (HOSPITAL_COMMUNITY): Payer: Self-pay | Admitting: Certified Registered Nurse Anesthetist

## 2018-06-14 ENCOUNTER — Inpatient Hospital Stay (HOSPITAL_COMMUNITY): Payer: Medicaid Other | Admitting: Certified Registered Nurse Anesthetist

## 2018-06-14 HISTORY — PX: CHOLECYSTECTOMY: SHX55

## 2018-06-14 LAB — COMPREHENSIVE METABOLIC PANEL
ALT: 253 U/L — ABNORMAL HIGH (ref 0–44)
AST: 209 U/L — ABNORMAL HIGH (ref 15–41)
Albumin: 3.1 g/dL — ABNORMAL LOW (ref 3.5–5.0)
Alkaline Phosphatase: 162 U/L — ABNORMAL HIGH (ref 38–126)
Anion gap: 8 (ref 5–15)
BUN: 11 mg/dL (ref 6–20)
CO2: 23 mmol/L (ref 22–32)
Calcium: 8.4 mg/dL — ABNORMAL LOW (ref 8.9–10.3)
Chloride: 110 mmol/L (ref 98–111)
Creatinine, Ser: 0.65 mg/dL (ref 0.44–1.00)
GFR calc Af Amer: >60 mL/min (ref >60)
GFR calc non Af Amer: >60 mL/min (ref >60)
Glucose, Bld: 84 mg/dL (ref 70–99)
Potassium: 3.5 mmol/L (ref 3.5–5.1)
Sodium: 141 mmol/L (ref 135–145)
Total Bilirubin: 0.9 mg/dL (ref 0.3–1.2)
Total Protein: 6.6 g/dL (ref 6.5–8.1)

## 2018-06-14 LAB — CBC
HCT: 35.2 % — ABNORMAL LOW (ref 36.0–46.0)
Hemoglobin: 11.3 g/dL — ABNORMAL LOW (ref 12.0–15.0)
MCH: 27 pg (ref 26.0–34.0)
MCHC: 32.1 g/dL (ref 30.0–36.0)
MCV: 84 fL (ref 80.0–100.0)
Platelets: 269 10*3/uL (ref 150–400)
RBC: 4.19 MIL/uL (ref 3.87–5.11)
RDW: 13.8 % (ref 11.5–15.5)
WBC: 8 10*3/uL (ref 4.0–10.5)
nRBC: 0 % (ref 0.0–0.2)

## 2018-06-14 SURGERY — LAPAROSCOPIC CHOLECYSTECTOMY WITH INTRAOPERATIVE CHOLANGIOGRAM
Anesthesia: General

## 2018-06-14 MED ORDER — MIDAZOLAM HCL 2 MG/2ML IJ SOLN
INTRAMUSCULAR | Status: AC
  Filled 2018-06-14: qty 2

## 2018-06-14 MED ORDER — 0.9 % SODIUM CHLORIDE (POUR BTL) OPTIME
TOPICAL | Status: DC | PRN
  Administered 2018-06-14: 1000 mL

## 2018-06-14 MED ORDER — OXYCODONE HCL 5 MG PO TABS
5.0000 mg | ORAL_TABLET | Freq: Once | ORAL | Status: AC | PRN
  Administered 2018-06-14: 5 mg via ORAL

## 2018-06-14 MED ORDER — KETOROLAC TROMETHAMINE 30 MG/ML IJ SOLN: 15.0000 mg | Freq: Three times a day (TID) | INTRAMUSCULAR | Status: DC

## 2018-06-14 MED ORDER — OXYCODONE HCL 5 MG PO TABS: 5.0000 mg | ORAL_TABLET | ORAL | Status: DC | PRN

## 2018-06-14 MED ORDER — FENTANYL CITRATE (PF) 100 MCG/2ML IJ SOLN
INTRAMUSCULAR | Status: AC
  Filled 2018-06-14: qty 2

## 2018-06-14 MED ORDER — KETOROLAC TROMETHAMINE 30 MG/ML IJ SOLN
15.0000 mg | Freq: Three times a day (TID) | INTRAMUSCULAR | Status: DC | PRN
  Administered 2018-06-14: 15 mg via INTRAVENOUS
  Filled 2018-06-14: qty 1

## 2018-06-14 MED ORDER — DEXAMETHASONE SODIUM PHOSPHATE 10 MG/ML IJ SOLN
INTRAMUSCULAR | Status: AC
  Filled 2018-06-14: qty 1

## 2018-06-14 MED ORDER — DEXAMETHASONE SODIUM PHOSPHATE 10 MG/ML IJ SOLN
INTRAMUSCULAR | Status: DC | PRN
  Administered 2018-06-14: 4 mg via INTRAVENOUS

## 2018-06-14 MED ORDER — OXYCODONE HCL 5 MG PO TABS: 5.0000 mg | ORAL_TABLET | ORAL | 0 refills | Status: DC | PRN

## 2018-06-14 MED ORDER — FENTANYL CITRATE (PF) 100 MCG/2ML IJ SOLN
INTRAMUSCULAR | Status: DC | PRN
  Administered 2018-06-14: 50 ug via INTRAVENOUS
  Administered 2018-06-14: 25 ug via INTRAVENOUS
  Administered 2018-06-14: 50 ug via INTRAVENOUS
  Administered 2018-06-14: 25 ug via INTRAVENOUS

## 2018-06-14 MED ORDER — IBUPROFEN 600 MG PO TABS: 600.0000 mg | ORAL_TABLET | Freq: Three times a day (TID) | ORAL | 0 refills | Status: DC | PRN

## 2018-06-14 MED ORDER — ONDANSETRON HCL 4 MG/2ML IJ SOLN
INTRAMUSCULAR | Status: DC | PRN
  Administered 2018-06-14: 4 mg via INTRAVENOUS

## 2018-06-14 MED ORDER — CEFAZOLIN SODIUM 1 G IJ SOLR
INTRAMUSCULAR | Status: AC
  Filled 2018-06-14: qty 20

## 2018-06-14 MED ORDER — MIDAZOLAM HCL 5 MG/5ML IJ SOLN
INTRAMUSCULAR | Status: DC | PRN
  Administered 2018-06-14: 2 mg via INTRAVENOUS

## 2018-06-14 MED ORDER — IOPAMIDOL (ISOVUE-300) INJECTION 61%
INTRAVENOUS | Status: AC
  Filled 2018-06-14: qty 50

## 2018-06-14 MED ORDER — PHENYLEPHRINE 40 MCG/ML (10ML) SYRINGE FOR IV PUSH (FOR BLOOD PRESSURE SUPPORT)
PREFILLED_SYRINGE | INTRAVENOUS | Status: AC
  Filled 2018-06-14: qty 10

## 2018-06-14 MED ORDER — PROMETHAZINE HCL 25 MG/ML IJ SOLN: 6.2500 mg | INTRAMUSCULAR | Status: DC | PRN

## 2018-06-14 MED ORDER — BUPIVACAINE HCL 0.25 % IJ SOLN
INTRAMUSCULAR | Status: DC | PRN
  Administered 2018-06-14: 30 mL

## 2018-06-14 MED ORDER — OXYCODONE HCL 5 MG PO TABS
ORAL_TABLET | ORAL | Status: AC
  Filled 2018-06-14: qty 1

## 2018-06-14 MED ORDER — ROCURONIUM BROMIDE 50 MG/5ML IV SOSY
PREFILLED_SYRINGE | INTRAVENOUS | Status: DC | PRN
  Administered 2018-06-14: 50 mg via INTRAVENOUS

## 2018-06-14 MED ORDER — CEFAZOLIN SODIUM-DEXTROSE 2-3 GM-%(50ML) IV SOLR
INTRAVENOUS | Status: DC | PRN
  Administered 2018-06-14: 2 g via INTRAVENOUS

## 2018-06-14 MED ORDER — BUPIVACAINE HCL (PF) 0.25 % IJ SOLN
INTRAMUSCULAR | Status: AC
  Filled 2018-06-14: qty 30

## 2018-06-14 MED ORDER — KETOROLAC TROMETHAMINE 30 MG/ML IJ SOLN
INTRAMUSCULAR | Status: DC | PRN
  Administered 2018-06-14: 30 mg via INTRAVENOUS

## 2018-06-14 MED ORDER — ACETAMINOPHEN 500 MG PO TABS
1000.0000 mg | ORAL_TABLET | Freq: Four times a day (QID) | ORAL | Status: DC
  Administered 2018-06-14: 1000 mg via ORAL
  Filled 2018-06-14: qty 2

## 2018-06-14 MED ORDER — FENTANYL CITRATE (PF) 100 MCG/2ML IJ SOLN
25.0000 ug | INTRAMUSCULAR | Status: DC | PRN
  Administered 2018-06-14: 50 ug via INTRAVENOUS

## 2018-06-14 MED ORDER — KETOROLAC TROMETHAMINE 30 MG/ML IJ SOLN
INTRAMUSCULAR | Status: AC
  Filled 2018-06-14: qty 1

## 2018-06-14 MED ORDER — LACTATED RINGERS IV SOLN
INTRAVENOUS | Status: DC
  Administered 2018-06-14 (×2): via INTRAVENOUS

## 2018-06-14 MED ORDER — ENOXAPARIN SODIUM 40 MG/0.4ML ~~LOC~~ SOLN: 40.0000 mg | SUBCUTANEOUS | Status: DC

## 2018-06-14 MED ORDER — FENTANYL CITRATE (PF) 250 MCG/5ML IJ SOLN
INTRAMUSCULAR | Status: AC
  Filled 2018-06-14: qty 5

## 2018-06-14 MED ORDER — OXYCODONE HCL 5 MG/5ML PO SOLN: 5.0000 mg | Freq: Once | ORAL | Status: AC | PRN

## 2018-06-14 MED ORDER — ACETAMINOPHEN 500 MG PO TABS: 1000.0000 mg | ORAL_TABLET | Freq: Four times a day (QID) | ORAL | 0 refills | Status: DC | PRN

## 2018-06-14 MED ORDER — LIDOCAINE 2% (20 MG/ML) 5 ML SYRINGE
INTRAMUSCULAR | Status: DC | PRN
  Administered 2018-06-14: 60 mg via INTRAVENOUS

## 2018-06-14 MED ORDER — SCOPOLAMINE 1 MG/3DAYS TD PT72
MEDICATED_PATCH | TRANSDERMAL | Status: AC
  Filled 2018-06-14: qty 1

## 2018-06-14 MED ORDER — ONDANSETRON HCL 4 MG/2ML IJ SOLN
INTRAMUSCULAR | Status: AC
  Filled 2018-06-14: qty 2

## 2018-06-14 MED ORDER — SODIUM CHLORIDE 0.9 % IR SOLN
Status: DC | PRN
  Administered 2018-06-14: 1000 mL

## 2018-06-14 MED ORDER — PROPOFOL 10 MG/ML IV BOLUS
INTRAVENOUS | Status: AC
  Filled 2018-06-14: qty 20

## 2018-06-14 MED ORDER — IBUPROFEN 600 MG PO TABS: 600.0000 mg | ORAL_TABLET | Freq: Four times a day (QID) | ORAL | Status: DC | PRN

## 2018-06-14 MED ORDER — PROPOFOL 10 MG/ML IV BOLUS
INTRAVENOUS | Status: DC | PRN
  Administered 2018-06-14: 160 mg via INTRAVENOUS

## 2018-06-14 MED ORDER — SCOPOLAMINE 1 MG/3DAYS TD PT72
MEDICATED_PATCH | TRANSDERMAL | Status: DC | PRN
  Administered 2018-06-14: 1 via TRANSDERMAL

## 2018-06-14 MED ORDER — SUGAMMADEX SODIUM 200 MG/2ML IV SOLN
INTRAVENOUS | Status: DC | PRN
  Administered 2018-06-14: 200 mg via INTRAVENOUS

## 2018-06-14 SURGICAL SUPPLY — 44 items
APPLIER CLIP ROT 10 11.4 M/L (STAPLE)
BLADE CLIPPER SURG (BLADE) IMPLANT
CANISTER SUCT 3000ML PPV (MISCELLANEOUS) ×3 IMPLANT
CATH CHOLANG 76X19 KUMAR (CATHETERS) ×3 IMPLANT
CHLORAPREP W/TINT 26ML (MISCELLANEOUS) ×3 IMPLANT
CLIP APPLIE ROT 10 11.4 M/L (STAPLE) IMPLANT
CLIP VESOLOCK MED LG 6/CT (CLIP) IMPLANT
COVER MAYO STAND STRL (DRAPES) ×3 IMPLANT
COVER SURGICAL LIGHT HANDLE (MISCELLANEOUS) ×3 IMPLANT
COVER WAND RF STERILE (DRAPES) ×3 IMPLANT
DERMABOND ADVANCED (GAUZE/BANDAGES/DRESSINGS) ×2
DERMABOND ADVANCED .7 DNX12 (GAUZE/BANDAGES/DRESSINGS) ×1 IMPLANT
DRAPE C-ARM 42X72 X-RAY (DRAPES) ×3 IMPLANT
ELECT REM PT RETURN 9FT ADLT (ELECTROSURGICAL) ×3
ELECTRODE REM PT RTRN 9FT ADLT (ELECTROSURGICAL) ×1 IMPLANT
GLOVE BIOGEL PI IND STRL 6.5 (GLOVE) ×1 IMPLANT
GLOVE BIOGEL PI IND STRL 7.0 (GLOVE) ×1 IMPLANT
GLOVE BIOGEL PI INDICATOR 6.5 (GLOVE) ×2
GLOVE BIOGEL PI INDICATOR 7.0 (GLOVE) ×2
GLOVE SURG SS PI 6.5 STRL IVOR (GLOVE) ×3 IMPLANT
GLOVE SURG SS PI 7.0 STRL IVOR (GLOVE) ×6 IMPLANT
GOWN STRL REUS W/ TWL LRG LVL3 (GOWN DISPOSABLE) ×3 IMPLANT
GOWN STRL REUS W/TWL LRG LVL3 (GOWN DISPOSABLE) ×6
GRASPER SUT TROCAR 14GX15 (MISCELLANEOUS) ×3 IMPLANT
KIT BASIN OR (CUSTOM PROCEDURE TRAY) ×3 IMPLANT
KIT TURNOVER KIT B (KITS) ×3 IMPLANT
NEEDLE 22X1 1/2 (OR ONLY) (NEEDLE) ×3 IMPLANT
NS IRRIG 1000ML POUR BTL (IV SOLUTION) ×3 IMPLANT
PAD ARMBOARD 7.5X6 YLW CONV (MISCELLANEOUS) ×3 IMPLANT
POUCH RETRIEVAL ECOSAC 10 (ENDOMECHANICALS) ×1 IMPLANT
POUCH RETRIEVAL ECOSAC 10MM (ENDOMECHANICALS) ×2
SCISSORS LAP 5X35 DISP (ENDOMECHANICALS) ×3 IMPLANT
SET IRRIG TUBING LAPAROSCOPIC (IRRIGATION / IRRIGATOR) ×3 IMPLANT
SET TUBE SMOKE EVAC HIGH FLOW (TUBING) ×3 IMPLANT
SLEEVE ENDOPATH XCEL 5M (ENDOMECHANICALS) ×6 IMPLANT
SPECIMEN JAR SMALL (MISCELLANEOUS) ×3 IMPLANT
STOPCOCK 4 WAY LG BORE MALE ST (IV SETS) ×3 IMPLANT
SUT MNCRL AB 4-0 PS2 18 (SUTURE) ×6 IMPLANT
TOWEL OR 17X24 6PK STRL BLUE (TOWEL DISPOSABLE) ×3 IMPLANT
TOWEL OR 17X26 10 PK STRL BLUE (TOWEL DISPOSABLE) ×3 IMPLANT
TRAY LAPAROSCOPIC MC (CUSTOM PROCEDURE TRAY) ×3 IMPLANT
TROCAR XCEL 12X100 BLDLESS (ENDOMECHANICALS) ×3 IMPLANT
TROCAR XCEL NON-BLD 5MMX100MML (ENDOMECHANICALS) ×3 IMPLANT
WATER STERILE IRR 1000ML POUR (IV SOLUTION) ×3 IMPLANT

## 2018-06-14 NOTE — Progress Notes (Signed)
  Progress Note: General Surgery Service   Assessment/Plan: Active Problems:   Symptomatic cholelithiasis  s/p Procedure(s): LAPAROSCOPIC CHOLECYSTECTOMY 06/14/2018 Plan for OR today WBC in urine noted, patient reports dysuria 2 months ago, I think this will be effectively treated with rocephin We discussed the etiology of her pain, we discussed treatment options and recommended surgery. We discussed details of surgery including general anesthesia, laparoscopic approach, identification of cystic duct and common bile duct. Ligation of cystic duct and cystic artery. Possible need for intraoperative cholangiogram or open procedure. Possible risks of common bile duct injury, liver injury, cystic duct leak, bleeding, infection, post-cholecystectomy syndrome. The patient showed good understanding and all questions were answered    LOS: 1 day  Chief Complaint/Subjective: Pain improved, no nausea overnight  Objective: Vital signs in last 24 hours: Temp:  [98.2 F (36.8 C)-98.9 F (37.2 C)] 98.2 F (36.8 C) (02/03 0626) Pulse Rate:  [58-87] 72 (02/03 0626) Resp:  [14-22] 16 (02/03 0626) BP: (93-121)/(57-77) 93/57 (02/03 0626) SpO2:  [96 %-99 %] 96 % (02/03 0626) Last BM Date: 06/13/18  Intake/Output from previous day: 02/02 0701 - 02/03 0700 In: 2857.4 [P.O.:720; I.V.:1037.4; IV Piggyback:1100] Out: 1300 [Urine:1300] Intake/Output this shift: No intake/output data recorded.  Lungs: nonlabored breathing  Cardiovascular: RRR  Abd: soft, NT, ND  Extremities: no edema  Neuro: AOx4  Lab Results: CBC  Recent Labs    06/13/18 1145 06/14/18 0239  WBC 7.8 8.0  HGB 12.3 11.3*  HCT 38.2 35.2*  PLT 267 269   BMET Recent Labs    06/13/18 0600 06/13/18 1145 06/14/18 0239  NA 140  --  141  K 3.6  --  3.5  CL 106  --  110  CO2 24  --  23  GLUCOSE 127*  --  84  BUN 14  --  11  CREATININE 0.77 0.56 0.65  CALCIUM 9.8  --  8.4*   PT/INR No results for input(s): LABPROT, INR  in the last 72 hours. ABG No results for input(s): PHART, HCO3 in the last 72 hours.  Invalid input(s): PCO2, PO2  Studies/Results:  Anti-infectives: Anti-infectives (From admission, onward)   Start     Dose/Rate Route Frequency Ordered Stop   06/13/18 1115  cefTRIAXone (ROCEPHIN) 2 g in sodium chloride 0.9 % 100 mL IVPB     2 g 200 mL/hr over 30 Minutes Intravenous Every 24 hours 06/13/18 1112        Medications: Scheduled Meds: . docusate sodium  100 mg Oral BID  . enoxaparin (LOVENOX) injection  40 mg Subcutaneous Q24H  . sodium chloride flush  3 mL Intravenous Once   Continuous Infusions: . sodium chloride 75 mL/hr at 06/13/18 1800  . cefTRIAXone (ROCEPHIN)  IV 2 g (06/13/18 1556)   PRN Meds:.acetaminophen **OR** acetaminophen, hydrALAZINE, morphine injection, ondansetron **OR** ondansetron (ZOFRAN) IV, oxyCODONE  Mickeal Skinner, MD [facility]

## 2018-06-14 NOTE — Discharge Instructions (Signed)
CIRUGIA LAPAROSCOPICA: INSTRUCCIONES DE POST OPERATORIO.  Revise siempre los documentos que le entreguen en el lugar donde se ha hecho la cirugia.  SI USTED NECESITA DOCUMENTOS DE INCAPACIDAD (DISABLE) O DE PERMISO FAMILAR (FAMILY LEAVE) NECESITA TRAERLOS A LA OFICINA PARA QUE SEAN PROCESADOS. NO  SE LOS DE A SU DOCTOR. 1. A su alta del hospital se le dara una receta para controlar el dolor. Tomela como ha sido recetada, si la necesita. Si no la necesita puede tomar, Acetaminofen (Tylenol) o Ibuprofen (Advil) para aliviar dolor moderado. 2. Continue tomando el resto de sus medicinas. 3. Si necesita rellenar la receta, llame a la farmacia. ellos contactan a nuestra oficina pidiendo autorizacion. Este tipo de receta no pueden ser rellenadas despues de las  5pm o durante los fines de semana. 4. Con relacion a la dieta: debe ser ligera los primeros dias despues que llege a la casa. Ejemplo: sopas y galleticas. Tome bastante liquido esos dias. 5. La mayoria de los pacientes padecen de inflamacion y cambio de coloracion de la piel alrededor de las incisiones. esto toma dias en resolver.  pnerse una bolsa de hielo en el area affectada ayuda..  6. Es comun tambien tener un poco de estrenimiento si esta tomado medicinas para el dolor. incremente la cantidad de liquidos a tomar y puede tomar (Colace) esto previene el problema. Si ya tiene estrenimiento, es decir no ha defecado en 48 horas, puede tomar un laxativo (Milk of Magnesia or Miralax) uselo como el paquete le explica. 7.  A menos que se le diga algo diferente. Remueva el bendaje a las 24-48 horas despues dela cirugia. y puede banarse en la ducha sin ningun problema. usted puede tener steri-strips (pequenas curitas transparentes en la piel puesta encima de la incision)  Estas banditas strips should be left on the skin for 7-10 days.   Si su cirujano puso pegamento encima de la incision usted puede banarse bajo la ducha en 24 horas. Este pegamento empezara a  caerse en las proximas 2-3 semanas. Si le pusieron suturas o presillas (grapos) estos seran quitados en su proxima cita en la oficina. . a. ACTIVIDADES:  Puede hacer actividad ligera.  Como caminar , subir escaleras y poco a poco irlas incrementando tanto como las tolere. Puede tener relaciones sexuales cuando sea comfortable. No carge objetos pesados o haga esfuerzos que no sean aprovados por su doctor. b. Puede manejar en cuanto no esta tomando medicamentos fuertes (narcoticos) para el dolor, pueda abrochar confortablemente el cinturon de seguridad, y pueda maniobrar y usar los pedales de su vehiculo con seguridad. c. PUEDE REGRESAR A TRABAJAR  8. Debe ver a su doctor para una cita de seguimiento en 2-3 semanas despues de la cirugia.  9. OTRAS ISNSTRUCCIONES:___________________________________________________________________________________ CUANDO LLAMAR A SU MEDICO: 1. FIEBRE mayor de  101.0 2. No produccion de orina. 3. Sangramiento continue de la herida 4. Incremento de dolor, enrojecimientio o drenaje de la herida (incision) 5. Incremento de dolor abdominal.  The clinic staff is available to answer your questions during regular business hours.  Please don't hesitate to call and ask to speak to one of the nurses for clinical concerns.  If you have a medical emergency, go to the nearest emergency room or call 911.  A surgeon from [facility] is always on call at the hospital. [address], [address], [place], [place]  [address] ? [address] [address], [place], [place]   [address] [phone] ? [phone] ? FAX [phone] Web site: [email or web address]  .........     Managing Your Pain After Surgery Without Opioids    Thank you for participating in our program to help patients manage their pain after surgery without opioids. This is part of our effort to provide you with the best care possible, without exposing you or your family to the risk that opioids  pose.  What pain can I expect after surgery? You can expect to have some pain after surgery. This is normal. The pain is typically worse the day after surgery, and quickly begins to get better. Many studies have found that many patients are able to manage their pain after surgery with Over-the-Counter (OTC) medications such as Tylenol and Motrin. If you have a condition that does not allow you to take Tylenol or Motrin, notify your surgical team.  How will I manage my pain? The best strategy for controlling your pain after surgery is around the clock pain control with Tylenol (acetaminophen) and Motrin (ibuprofen or Advil). Alternating these medications with each other allows you to maximize your pain control. In addition to Tylenol and Motrin, you can use heating pads or ice packs on your incisions to help reduce your pain.  How will I alternate your regular strength over-the-counter pain medication? You will take a dose of pain medication every three hours. ; Start by taking 650 mg of Tylenol (2 pills of 325 mg) ; 3 hours later take 600 mg of Motrin (3 pills of 200 mg) ; 3 hours after taking the Motrin take 650 mg of Tylenol ; 3 hours after that take 600 mg of Motrin.   - 1 -  See example - if your first dose of Tylenol is at 12:00 PM   12:00 PM Tylenol 650 mg (2 pills of 325 mg)  3:00 PM Motrin 600 mg (3 pills of 200 mg)  6:00 PM Tylenol 650 mg (2 pills of 325 mg)  9:00 PM Motrin 600 mg (3 pills of 200 mg)  Continue alternating every 3 hours   We recommend that you follow this schedule around-the-clock for at least 3 days after surgery, or until you feel that it is no longer needed. Use the table on the last page of this handout to keep track of the medications you are taking. Important: Do not take more than 3000mg of Tylenol or 3200mg of Motrin in a 24-hour period. Do not take ibuprofen/Motrin if you have a history of bleeding stomach ulcers, severe kidney disease, &/or actively  taking a blood thinner  What if I still have pain? If you have pain that is not controlled with the over-the-counter pain medications (Tylenol and Motrin or Advil) you might have what we call "breakthrough" pain. You will receive a prescription for a small amount of an opioid pain medication such as Oxycodone, Tramadol, or Tylenol with Codeine. Use these opioid pills in the first 24 hours after surgery if you have breakthrough pain. Do not take more than 1 pill every 4-6 hours.  If you still have uncontrolled pain after using all opioid pills, don't hesitate to call our staff using the number provided. We will help make sure you are managing your pain in the best way possible, and if necessary, we can provide a prescription for additional pain medication.   Day 1    Time  Name of Medication Number of pills taken  Amount of Acetaminophen  Pain Level   Comments  AM PM       AM PM       AM PM         AM PM       AM PM       AM PM       AM PM       AM PM       Total Daily amount of Acetaminophen Do not take more than  3,000 mg per day      Day 2    Time  Name of Medication Number of pills taken  Amount of Acetaminophen  Pain Level   Comments  AM PM       AM PM       AM PM       AM PM       AM PM       AM PM       AM PM       AM PM       Total Daily amount of Acetaminophen Do not take more than  3,000 mg per day      Day 3    Time  Name of Medication Number of pills taken  Amount of Acetaminophen  Pain Level   Comments  AM PM       AM PM       AM PM       AM PM          AM PM       AM PM       AM PM       AM PM       Total Daily amount of Acetaminophen Do not take more than  3,000 mg per day      Day 4    Time  Name of Medication Number of pills taken  Amount of Acetaminophen  Pain Level   Comments  AM PM       AM PM       AM PM       AM PM       AM PM       AM PM       AM PM       AM PM       Total Daily amount of Acetaminophen Do not  take more than  3,000 mg per day      Day 5    Time  Name of Medication Number of pills taken  Amount of Acetaminophen  Pain Level   Comments  AM PM       AM PM       AM PM       AM PM       AM PM       AM PM       AM PM       AM PM       Total Daily amount of Acetaminophen Do not take more than  3,000 mg per day       Day 6    Time  Name of Medication Number of pills taken  Amount of Acetaminophen  Pain Level  Comments  AM PM       AM PM       AM PM       AM PM       AM PM       AM PM       AM PM       AM PM       Total Daily amount of Acetaminophen Do not take more than    3,000 mg per day      Day 7    Time  Name of Medication Number of pills taken  Amount of Acetaminophen  Pain Level   Comments  AM PM       AM PM       AM PM       AM PM       AM PM       AM PM       AM PM       AM PM       Total Daily amount of Acetaminophen Do not take more than  3,000 mg per day        For additional information about how and where to safely dispose of unused opioid medications - https://www.morepowerfulnc.org  Disclaimer: This document contains information and/or instructional materials adapted from [facility] for the typical patient with your condition. It does not replace medical advice from your health care provider because your experience may differ from that of the typical patient. Talk to your health care provider if you have any questions about this document, your condition or your treatment plan. Adapted from [facility]   

## 2018-06-14 NOTE — Transfer of Care (Signed)
Immediate Anesthesia Transfer of Care Note  Patient: Kari Ortega  Procedure(s) Performed: LAPAROSCOPIC CHOLECYSTECTOMY (N/A )  Patient Location: PACU  Anesthesia Type:General  Level of Consciousness: awake and drowsy  Airway & Oxygen Therapy: Patient Spontanous Breathing and Patient connected to nasal cannula oxygen  Post-op Assessment: Report given to RN and Post -op Vital signs reviewed and stable  Post vital signs: Reviewed and stable  Last Vitals:  Vitals Value Taken Time  BP 131/79 06/14/2018 10:30 AM  Temp 36.3 C 06/14/2018 10:30 AM  Pulse 81 06/14/2018 10:31 AM  Resp 21 06/14/2018 10:31 AM  SpO2 94 % 06/14/2018 10:31 AM  Vitals shown include unvalidated device data.  Last Pain:  Vitals:   06/14/18 1030  TempSrc:   PainSc: (P) Asleep      Patients Stated Pain Goal: 2 (25/95/63 8756)  Complications: No apparent anesthesia complications

## 2018-06-14 NOTE — Anesthesia Preprocedure Evaluation (Addendum)
Anesthesia Evaluation  Patient identified by MRN, date of birth, ID band Patient awake    Reviewed: Allergy & Precautions, NPO status , Patient's Chart, lab work & pertinent test results  History of Anesthesia Complications Negative for: history of anesthetic complications  Airway Mallampati: II  TM Distance: >3 FB Neck ROM: Full    Dental  (+) Dental Advisory Given   Pulmonary neg pulmonary ROS,    breath sounds clear to auscultation       Cardiovascular negative cardio ROS   Rhythm:Regular Rate:Normal     Neuro/Psych  Hx "facial paralysis" with vaginal deliveries   Neuromuscular disease negative psych ROS   GI/Hepatic negative GI ROS, Neg liver ROS,   Endo/Other  negative endocrine ROS Obesity   Renal/GU negative Renal ROS     Musculoskeletal negative musculoskeletal ROS (+)   Abdominal   Peds  Hematology  (+) anemia ,   Anesthesia Other Findings   Reproductive/Obstetrics (+) Breast feeding                             Anesthesia Physical Anesthesia Plan  ASA: II  Anesthesia Plan: General   Post-op Pain Management:    Induction: Intravenous  PONV Risk Score and Plan: 4 or greater and Treatment may vary due to age or medical condition, Ondansetron, Scopolamine patch - Pre-op, Midazolam and Dexamethasone  Airway Management Planned: Oral ETT  Additional Equipment: None  Intra-op Plan:   Post-operative Plan: Extubation in OR  Informed Consent: I have reviewed the patients History and Physical, chart, labs and discussed the procedure including the risks, benefits and alternatives for the proposed anesthesia with the patient or authorized representative who has indicated his/her understanding and acceptance.     Dental advisory given  Plan Discussed with: CRNA and Anesthesiologist  Anesthesia Plan Comments:        Anesthesia Quick Evaluation

## 2018-06-14 NOTE — Anesthesia Postprocedure Evaluation (Signed)
Anesthesia Post Note  Patient: Kari Ortega  Procedure(s) Performed: LAPAROSCOPIC CHOLECYSTECTOMY (N/A )     Patient location during evaluation: PACU Anesthesia Type: General Level of consciousness: awake and alert Pain management: pain level controlled Vital Signs Assessment: post-procedure vital signs reviewed and stable Respiratory status: spontaneous breathing, nonlabored ventilation, respiratory function stable and patient connected to nasal cannula oxygen Cardiovascular status: blood pressure returned to baseline and stable Postop Assessment: no apparent nausea or vomiting Anesthetic complications: no    Last Vitals:  Vitals:   06/14/18 1107 06/14/18 1121  BP: 131/73 132/69  Pulse: 80 64  Resp: 15 17  Temp: 36.5 C 36.6 C  SpO2: 99% 97%                   Audry Pili

## 2018-06-14 NOTE — Anesthesia Procedure Notes (Signed)
Procedure Name: Intubation Date/Time: 06/14/2018 9:23 AM Performed by: Inda Coke, CRNA Pre-anesthesia Checklist: Patient identified, Emergency Drugs available, Suction available and Patient being monitored Patient Re-evaluated:Patient Re-evaluated prior to induction Oxygen Delivery Method: Circle System Utilized Preoxygenation: Pre-oxygenation with 100% oxygen Induction Type: IV induction Ventilation: Mask ventilation without difficulty Laryngoscope Size: Mac and 3 Grade View: Grade I Tube type: Oral Tube size: 7.0 mm Number of attempts: 1 Airway Equipment and Method: Stylet and Oral airway Placement Confirmation: ETT inserted through vocal cords under direct vision,  positive ETCO2 and breath sounds checked- equal and bilateral Secured at: 20 cm Tube secured with: Tape Dental Injury: Teeth and Oropharynx as per pre-operative assessment

## 2018-06-14 NOTE — Op Note (Signed)
PATIENT:  Kari Ortega  38 y.o. female  PRE-OPERATIVE DIAGNOSIS:  cholecystitis  POST-OPERATIVE DIAGNOSIS:  cholecystitis  PROCEDURE:  Procedure(s): LAPAROSCOPIC CHOLECYSTECTOMY   SURGEON:  Surgeon(s): [name], [name], MD  ASSISTANT: Alferd Apa  ANESTHESIA:   local and general  Indications for procedure: Kari Ortega is a 38 y.o. female with symptoms of Abdominal pain and Nausea and vomiting consistent with gallbladder disease, Confirmed by Ultrasound.  Description of procedure: The patient was brought into the operative suite, placed supine. Anesthesia was administered with endotracheal tube. Patient was strapped in place and foot board was secured. All pressure points were offloaded by foam padding. The patient was prepped and draped in the usual sterile fashion.  A small incision was made to the right subcostal area. A 31mm trocar was inserted into the peritoneal cavity with optical entry. Pneumoperitoneum was applied with high flow low pressure. 1 84mm trocar was placed in the RUQ and 1 34mm trocar was placed in the periumbilical area. A 18mm trocar was placed in the subxiphoid space. Marcaine was infused to the subxiphoid space and lateral upper right abdomen in the transversus abdominis plane. Next the patient was placed in reverse trendelenberg. The gallbladder was edematous and omentum was adhesed to the abdominal wall. Cautery was used to remove the omentum from the gallbladder.  The gallbladder was retracted cephalad and lateral. The peritoneum was reflected off the infundibulum working lateral to medial. The cystic duct and cystic artery were identified and further dissection revealed a critical view. The cystic duct and cystic artery were doubly clipped and ligated.   The gallbladder was removed off the liver bed with cautery. The Gallbladder was placed in a specimen bag. The gallbladder fossa was irrigated and hemostasis was applied with  cautery. The gallbladder was removed via the 34mm trocar. The fascial defect was closed with interrupted 0 vicryl suture via laparoscopic trans-fascial suture passer. Pneumoperitoneum was removed, all trocar were removed. All incisions were closed with 4-0 monocryl subcuticular stitch. The patient woke from anesthesia and was brought to PACU in stable condition. All counts were correct  Findings: edematous inflamed gallbladder  Specimen: gallbladder  Blood loss: 30 ml  Local anesthesia: 30 ml marcaine  Complications: none  PLAN OF CARE: Admit to inpatient   PATIENT DISPOSITION:  PACU - hemodynamically stable.  Gurney Maxin, M.D. General, Bariatric, & Minimally Invasive Surgery [facility]

## 2018-06-14 NOTE — Discharge Summary (Signed)
Patient ID: Kari Ortega [id number] 20-Jun-1980 38 y.o.  Admit date: 06/13/2018 Discharge date: 06/14/2018  Admitting Diagnosis: Cholecystitis  Postpartum   Discharge Diagnosis Patient Active Problem List   Diagnosis Date Noted  . Symptomatic cholelithiasis 06/13/2018  . Postpartum care and examination 04/29/2018  . Urine frequency 04/29/2018  . Normal labor 03/18/2018  . 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 BMI 30-39.9 09/21/2017  . Obesity in pregnancy 09/21/2017  . Language barrier 09/21/2017  . Supervision of high risk pregnancy, antepartum 08/24/2017  . AMA (advanced maternal age) multigravida 35+ 08/24/2017  . HYPERTRIGLYCERIDEMIA 12/15/2006    Consultants none  Reason for Admission: Pt is a spanish speaking 38 yo with a hx of L sided facial paralysis, postpartum who is currently breast feeding who presented to the ED with complaints of RUQ pain, acute onset last night, severe, constant, worse with deep breaths and radiating into her back. Associated nausea and vomiting. Hx of constipation but no blood in her stools. No urinary symptoms. She denies blood in urine or dysuria. No fever or chills. No other associated symptoms. No hx of abdominal surgeries, no anticoagulation and no daily medications. She states this is the 4th episode of these symptoms and the last episode was roughly one month ago.   Procedures Lap chole, Dr. Kieth Brightly, 2/3  Hospital Course:  The patient was admitted and underwent a laparoscopic cholecystectomy.  The patient tolerated the procedure well.  On POD 0, the patient was tolerating a regular diet, voiding well, mobilizing, and pain was controlled with oral pain medications.  The patient was stable for DC home at this time with appropriate follow up made.  We thoroughly discussed breast feeding with narcotics and ibuprofen at home.  She knows she can breast feed at the same time as taking oxycodone, but is warned that if baby gets  drowsy or too sleepy then she may need to stop taking the narcotic or pump and dump.  She has been encouraged to pump and dump until at least tomorrow morning until the general anesthetic medications have worn off.  This was all done via an interpretor.  The patient understands and all questions were answered.     Physical Exam: Abd: soft, appropriately tender, +BS, ND, incisions c/d/i with dermabond present  Allergies as of 06/14/2018   No Known Allergies     Medication List    STOP taking these medications   cyclobenzaprine 10 MG tablet Commonly known as:  FLEXERIL   famotidine 20 MG tablet Commonly known as:  PEPCID   ferrous sulfate 325 (65 FE) MG tablet   predniSONE 20 MG tablet Commonly known as:  DELTASONE   senna-docusate 8.6-50 MG tablet Commonly known as:  Senokot-S     TAKE these medications   acetaminophen 500 MG tablet Commonly known as:  TYLENOL Take 2 tablets (1,000 mg total) by mouth every 6 (six) hours as needed.   docusate sodium 100 MG capsule Commonly known as:  COLACE Take 1 capsule (100 mg total) by mouth 2 (two) times daily.   ibuprofen 600 MG tablet Commonly known as:  ADVIL,MOTRIN Take 1 tablet (600 mg total) by mouth every 8 (eight) hours as needed. What changed:    when to take this  reasons to take this   multivitamin-prenatal 27-0.8 MG Tabs tablet Take 1 tablet by mouth daily at 12 noon.   oxyCODONE 5 MG immediate release tablet Commonly known as:  Oxy IR/ROXICODONE Take 1 tablet (5 mg  total) by mouth every 4 (four) hours as needed for moderate pain.        Follow-up Information    [facility] Follow up on 07/06/2018.   Specialty:  General Surgery Why:  8:30am, arrive by 8:00am for paperwork and check in.  please bring photo ID and insurance card  Contact information: [place] [address] [phone]           Signed: Saverio Danker, [facility] 06/14/2018, 2:31  PM Pager: [phone]

## 2018-06-14 NOTE — OR Nursing (Signed)
Patient only speak Spanish. Interpreter was present via video for assessment. I was able to speak to her in Spanish and verified name, date of birth, and allergies with the interpreter.

## 2018-06-15 ENCOUNTER — Encounter (HOSPITAL_COMMUNITY): Payer: Self-pay | Admitting: General Surgery

## 2018-06-21 ENCOUNTER — Telehealth: Payer: Self-pay | Admitting: Physician Assistant

## 2018-06-21 NOTE — Telephone Encounter (Signed)
Patient called to check on the status of her CAFA. Please follow up

## 2018-06-21 NOTE — Telephone Encounter (Signed)
Pt was informed informed what CAFA will take 3 to 4 weeks

## 2018-07-07 ENCOUNTER — Ambulatory Visit (INDEPENDENT_AMBULATORY_CARE_PROVIDER_SITE_OTHER): Payer: Self-pay | Admitting: Primary Care

## 2018-07-07 ENCOUNTER — Encounter (INDEPENDENT_AMBULATORY_CARE_PROVIDER_SITE_OTHER): Payer: Self-pay | Admitting: Primary Care

## 2018-07-07 VITALS — BP 109/71 | HR 77 | Temp 97.8°F | Ht <= 58 in | Wt 160.6 lb

## 2018-07-07 DIAGNOSIS — E669 Obesity, unspecified: Secondary | ICD-10-CM

## 2018-07-07 DIAGNOSIS — K029 Dental caries, unspecified: Secondary | ICD-10-CM

## 2018-07-07 DIAGNOSIS — Z9049 Acquired absence of other specified parts of digestive tract: Secondary | ICD-10-CM

## 2018-07-07 DIAGNOSIS — Z789 Other specified health status: Secondary | ICD-10-CM

## 2018-07-07 DIAGNOSIS — R1032 Left lower quadrant pain: Secondary | ICD-10-CM

## 2018-07-07 NOTE — Patient Instructions (Signed)
Calambres y espasmos musculares Muscle Cramps and Spasms Los calambres y espasmos musculares se producen cuando los msculos se tensan por s mismos. Generalmente mejoran en unos minutos. Los calambres musculares son dolorosos. Por lo general, son ms fuertes y duran ms tiempo que los espasmos musculares. Los espasmos musculares pueden o no ser dolorosos. Pueden durar unos segundos o mucho ms tiempo. Los calambres y espasmos puede afectar a cualquier msculo, pero ocurren con mayor frecuencia en los msculos de la pantorrilla. Por lo general, no son provocados por un problema grave. En muchos de los casos, se desconoce la causa. Algunas causas frecuentes son las siguientes:  Hacer ms trabajo fsico o actividad fsica de lo que su cuerpo soporta.  Usar demasiado los msculos (uso excesivo) al repetir determinados movimientos demasiadas veces.  Permanecer en determinada posicin durante un tiempo prolongado.  Practicar un deporte o realizar una actividad sin prepararse adecuadamente.  Usar tcnicas o formas inadecuadas al practicar un deporte o realizar una actividad.  No tener suficiente agua en el cuerpo (deshidratacin).  Lesiones.  Efectos secundarios de algunos medicamentos.  Niveles bajos de sales y minerales en sangre (electrolitos), como por ejemplo, un nivel bajo de potasio o calcio. Siga estas indicaciones en su casa: Control del dolor y de la rigidez      [place], elongue y relaje el msculo. Hgalo durante varios minutos cada vez.  Si se lo indican, aplique calor en los msculos tensos o tirantes con la frecuencia que le haya indicado el mdico. Use la fuente de calor que el mdico le recomiende, como una compresa de calor hmedo o una almohadilla trmica. ? Coloque una toalla entre la piel y la fuente de calor. ? Aplique calor durante 20 a 30minutos. ? Retire la fuente de calor si la piel se pone de color rojo brillante. Esto es muy importante si no puede sentir dolor,  calor o fro. Puede correr un riesgo mayor de sufrir quemaduras.  Si se lo indican, aplique hielo en la zona afectada. Esto puede ayudar si despus de un calambre o espasmo tiene dolor o sensibilidad. ? Ponga el hielo en una bolsa plstica. ? Coloque una toalla entre la piel y la bolsa. ? Coloque el hielo durante 20minutos, 2 a 3veces por da.  Intente tomar duchas o baos con agua caliente para ayudar a relajar los msculos tirantes. Comida y bebida  Beba suficiente lquido para mantener la orina de color amarillo plido.  Consuma una dieta sana para asegurarse de que los msculos funcionen bien. Esto debe incluir lo siguiente: ? Frutas y vegetales. ? Protenas magras. ? Cereales integrales. ? Productos lcteos descremados o con bajo contenido de grasa. Indicaciones generales  Si tiene calambres con frecuencia, evite el ejercicio intenso durante varios das.  Tome los medicamentos de venta libre y los recetados solamente como se lo haya indicado el mdico.  Controle si hay algn cambio en sus sntomas.  Concurra a todas las visitas de seguimiento como se lo haya indicado el mdico. Esto es importante. Comunquese con un mdico si:  Sus calambres o espasmos empeoran u ocurren con ms frecuencia.  Sus calambres o espasmos no mejoran con el tiempo. Resumen  Los calambres y espasmos musculares se producen cuando los msculos se tensan por s mismos. Generalmente mejoran en unos minutos.  Los calambres y espasmos ocurren con mayor frecuencia en los msculos de la pantorrilla.  [place], elongue y relaje el msculo. Esto puede ayudar a que el calambre o espasmo desaparezca.  Beba suficiente lquido   para mantener la orina de color amarillo plido. Esta informacin no tiene como fin reemplazar el consejo del mdico. Asegrese de hacerle al mdico cualquier pregunta que tenga. Document Released: 07/25/2008 Document Revised: 11/05/2017 Document Reviewed: 11/05/2017 Elsevier  Interactive Patient Education  2019 Elsevier Inc.  

## 2018-07-07 NOTE — Progress Notes (Addendum)
Established Patient Office Visit  Subjective:  Patient ID: Kari Ortega, female    DOB: 06/07/1980  Age: 38 y.o. MRN: [id number]  CC:  Chief Complaint  Patient presents with  . Leg Pain    left leg  . Abdominal Pain    HPI Kari Ortega presents for with abdominal pain LLQ she has had a vaginal delivery 3 months ago and 20 days ago she had a Laparoscopic Cholecystectomy. She is also,  breastfeeding explain with interpretor  help she has went through 2 life changing events having a baby and surgery she will be sore and have pain . Know s/s of infection fever, warm, swelling or odor needs to be seen by GI, GYN or PCP. Patient verbalized understanding.  Past Medical History:  Diagnosis Date  . Anemia   . 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  Unable to determine reason per pt.    . Irregular periods     Past Surgical History:  Procedure Laterality Date  . CHOLECYSTECTOMY N/A 06/14/2018   Procedure: LAPAROSCOPIC CHOLECYSTECTOMY;  Surgeon: [name], [name], MD;  Location: [place];  Service: General;  Laterality: N/A;  . TOOTH EXTRACTION      Family History  Problem Relation Age of Onset  . Diabetes Mother     Social History   Socioeconomic History  . Marital status: Married    Spouse name: Not on file  . Number of children: Not on file  . Years of education: Not on file  . Highest education level: Not on file  Occupational History  . Not on file  Social Needs  . Financial resource strain: Not on file  . Food insecurity:    Worry: Not on file    Inability: Not on file  . Transportation needs:    Medical: Not on file    Non-medical: Not on file  Tobacco Use  . Smoking status: Never Smoker  . Smokeless tobacco: Never Used  Substance and Sexual Activity  . Alcohol use: No  . Drug use: No  . Sexual activity: Yes    Birth control/protection: None  Lifestyle  . Physical activity:    Days per week: Not on  file    Minutes per session: Not on file  . Stress: Not on file  Relationships  . Social connections:    Talks on phone: Not on file    Gets together: Not on file    Attends religious service: Not on file    Active member of club or organization: Not on file    Attends meetings of clubs or organizations: Not on file    Relationship status: Not on file  . Intimate partner violence:    Fear of current or ex partner: Not on file    Emotionally abused: Not on file    Physically abused: Not on file    Forced sexual activity: Not on file  Other Topics Concern  . Not on file  Social History Narrative  . Not on file    Outpatient Medications Prior to Visit  Medication Sig Dispense Refill  . acetaminophen (TYLENOL) 500 MG tablet Take 2 tablets (1,000 mg total) by mouth every 6 (six) hours as needed. 30 tablet 0  . Prenatal Vit-Fe Fumarate-FA (MULTIVITAMIN-PRENATAL) 27-0.8 MG TABS tablet Take 1 tablet by mouth daily at 12 noon.    Marland Kitchen ibuprofen (ADVIL,MOTRIN) 600 MG tablet Take 1 tablet (600 mg total) by mouth every 8 (eight) hours  as needed. 30 tablet 0  . docusate sodium (COLACE) 100 MG capsule Take 1 capsule (100 mg total) by mouth 2 (two) times daily. 10 capsule 0  . oxyCODONE (OXY IR/ROXICODONE) 5 MG immediate release tablet Take 1 tablet (5 mg total) by mouth every 4 (four) hours as needed for moderate pain. 20 tablet 0   Facility-Administered Medications Prior to Visit  Medication Dose Route Frequency Provider Last Rate Last Dose  . polyethylene glycol powder (GLYCOLAX/MIRALAX) container 255 g  1 Container Oral Once Rasch, Anderson Malta I, NP        No Known Allergies  ROS Review of Systems  Constitutional: Negative.   HENT: Negative.   Eyes: Negative.   Cardiovascular: Negative.   Gastrointestinal: Positive for abdominal pain.  Endocrine: Negative.   Genitourinary: Negative.   Musculoskeletal: Negative.   Skin: Negative.   Allergic/Immunologic: Negative.   Neurological:  Negative.   Hematological: Negative.   Psychiatric/Behavioral: Positive for agitation.      Objective:    Physical Exam  Constitutional: She is oriented to person, place, and time. She appears well-developed and well-nourished.  HENT:  Head: Normocephalic and atraumatic.  Eyes: Pupils are equal, round, and reactive to light. EOM are normal.  Neck: Normal range of motion. Neck supple.  Cardiovascular: Normal rate.  Pulmonary/Chest: Effort normal and breath sounds normal.  Abdominal: Soft. Bowel sounds are normal.  Musculoskeletal: Normal range of motion.  Neurological: She is alert and oriented to person, place, and time.  Skin: Skin is warm.  Psychiatric: She has a normal mood and affect.    BP 109/71 (BP Location: Right Arm, Patient Position: Sitting, Cuff Size: Normal)   Pulse 77   Temp 97.8 F (36.6 C) (Oral)   Ht 4\' 9"  (1.448 m)   Wt 160 lb 9.6 oz (72.8 kg)   SpO2 95%   Breastfeeding Yes   BMI 34.75 kg/m  Wt Readings from Last 3 Encounters:  07/29/18 160 lb (72.6 kg)  07/07/18 160 lb 9.6 oz (72.8 kg)  06/13/18 170 lb 10.2 oz (77.4 kg)     There are no preventive care reminders to display for this patient.  There are no preventive care reminders to display for this patient.  Lab Results  Component Value Date   TSH 1.420 08/24/2017   Lab Results  Component Value Date   WBC 8.0 06/14/2018   HGB 11.3 (L) 06/14/2018   HCT 35.2 (L) 06/14/2018   MCV 84.0 06/14/2018   PLT 269 06/14/2018   Lab Results  Component Value Date   NA 141 06/14/2018   K 3.5 06/14/2018   CO2 23 06/14/2018   GLUCOSE 84 06/14/2018   BUN 11 06/14/2018   CREATININE 0.65 06/14/2018   BILITOT 0.9 06/14/2018   ALKPHOS 162 (H) 06/14/2018   AST 209 (H) 06/14/2018   ALT 253 (H) 06/14/2018   PROT 6.6 06/14/2018   ALBUMIN 3.1 (L) 06/14/2018   CALCIUM 8.4 (L) 06/14/2018   ANIONGAP 8 06/14/2018   Lab Results  Component Value Date   HGBA1C 5.2 08/24/2017      Assessment & Plan:    Problem List Items Addressed This Visit    BMI 30-39.9 - Primary Recently gave birth 3 months ago breast feeding I  will address weight when she weans her baby off the breast.    Language barrier -Spanish we use interpretor  for visits    Other Visit Diagnoses    Dental caries    No abscess present broken teeth  Possible cavities - sensitive to temperature changes     Abdominal pain-   abdominal pain LLQ she has had a vaginal delivery 3 months ago and 20 days ago she had a Laparoscopic Cholecystectomy. She is also,  breastfeeding explain with interpretor  help she has went through 2 life changing events having a baby and surgery she will be sore and have pain . Know s/s of infection fever, warm, swelling or odor needs to be seen by GI, GYN or PCP. Patient verbalized understanding  No orders of the defined types were placed in this encounter.   Follow-up: Return if symptoms worsen or fail to improve.    Kerin Perna, NP

## 2018-07-15 NOTE — Telephone Encounter (Signed)
Pt called back to inform about an emergency application that was put in for her for medicaid pt would like a follow up in regards to this she has questions about how it would affect her cafa please follow up

## 2018-07-19 ENCOUNTER — Telehealth: Payer: Self-pay | Admitting: Primary Care

## 2018-07-19 NOTE — Telephone Encounter (Signed)
Ch is her chart stuill medicaid pending, if not check if she need to bring a new app or we can still used the old one

## 2018-07-29 ENCOUNTER — Other Ambulatory Visit: Payer: Self-pay

## 2018-07-29 ENCOUNTER — Encounter (INDEPENDENT_AMBULATORY_CARE_PROVIDER_SITE_OTHER): Payer: Self-pay | Admitting: Primary Care

## 2018-07-29 ENCOUNTER — Ambulatory Visit (INDEPENDENT_AMBULATORY_CARE_PROVIDER_SITE_OTHER): Payer: Self-pay | Admitting: Primary Care

## 2018-07-29 VITALS — BP 106/72 | HR 72 | Temp 97.9°F | Ht <= 58 in | Wt 160.0 lb

## 2018-07-29 DIAGNOSIS — M25562 Pain in left knee: Secondary | ICD-10-CM

## 2018-07-29 DIAGNOSIS — M25561 Pain in right knee: Secondary | ICD-10-CM

## 2018-07-29 DIAGNOSIS — E669 Obesity, unspecified: Secondary | ICD-10-CM

## 2018-07-29 DIAGNOSIS — M25569 Pain in unspecified knee: Secondary | ICD-10-CM

## 2018-07-29 DIAGNOSIS — Z789 Other specified health status: Secondary | ICD-10-CM

## 2018-07-29 NOTE — Patient Instructions (Signed)
Kari Ortega y uso de medicamentos Breastfeeding and Medicine Use La mayora de los medicamentos son seguros para que los use mientras amamanta porque solo una pequea cantidad de medicamento pasa a la [place]. Sin embargo, es importante que hable con el mdico acerca de todas las vacunas y [employer] que est usando [place]. Estos incluyen los siguientes:  Medicamentos recetados.  Medicamentos de [employer].  Vitaminas, hierbas y suplementos.  Gotas oftlmicas.  Cremas. Cules son los riesgos? Cuando [place], pequeas cantidades de los medicamentos que [place] se pasan al beb a travs de la leche materna. En la [employer], estas pequeas cantidades no son perjudiciales para el beb. Algunos medicamentos pueden presentarse en cantidades Google. Para mantener seguro al beb, el mdico puede recomendarle que deje de amamantar mientras [place] ciertos medicamentos. Es posible que deba dejar de Economist por un tiempo breve o de forma [place]. Esto depender de durante cunto tiempo necesite usar [employer]. Otros medicamentos pueden disminuir el suministro de [place]. La mayora de las mujeres continan usando esos medicamentos por una perodo breve de tiempo sin que haya efectos en la lactancia en general. Genevive Bi con el mdico o el especialista en lactancia (asesor en Transport planner) para buscar formas de Contractor suministro de [place]. Siga estas indicaciones en su casa:  No deje de usar ningn medicamento recetado, excepto si se lo indica el mdico. Hable con su mdico acerca de si realmente necesita usar ese medicamento. ? Los medicamentos que se usan generalmente luego del parto son seguros para usar Adult nurse. Estos incluyen ibuprofeno, acetominofeno y laxantes.  No use ningn medicamento ni reciba ninguna vacuna sin haber hablado al respecto con su mdico y con el pediatra de su beb.  Siempre lea las etiquetas antes de usar  un medicamento. Busque cules son los riesgos para las mujeres que estn [place].  Intente usar el medicamento justo despus de Economist. Esto puede ayudar a Administrator, arts exposicin del beb al [employer] la prxima vez que [place].  Evite usar lo siguiente: ? Medicamentos de liberacin lenta y que permanecen en el organismo por ms tiempo (medicamentos de accin prolongada). ? Medicamentos de [place] para el resfro y las alergias que contienen pseudoefedrina. Este componente puede disminuir el suministro de [place]. ? Medicamentos que no son necesarios desde el punto de vista mdico en su caso. Estos pueden incluir medicamentos a base de hierbas, vitaminas de alta dosis y suplementos inusuales. Trabaje con el mdico para determinar qu medicamentos realmente necesita.  Si comienza a Therapist, sports, observe al beb para detectar si hay signos de somnolencia o irritabilidad.  Si sabe con anticipacin que deber dejar de Economist por un perodo corto de tiempo, planifique con antelacin. ? Antes de comenzar a Environmental consultant, extraiga y Cabin crew un suministro de Midwife. Alimente al beb con esta leche mientras [place] el medicamento. ? Mientras [place] el [place], extraiga y deseche la leche materna hasta que ya no est recibiendo Dentist. Continuar extrayndose leche puede ayudar a Best boy la seguridad de que el cuerpo estar listo para Economist luego de que ya no necesite el medicamento. Dnde encontrar ms informacin  [place] de los EE.UU. - Base de datos sobre [place] y [place] (LactMed) ([place] of Medicine - Drugs and Lactation Database (LactMed): ScreensNames.is  Administracin de Clinical research associate y Medicamentos de los EE.UU. (U.S. Food &and Drug Administration): GuamGaming.ch  [facility] Infantiles (Infant Risk  Center): ? En lnea en www.infantrisk.com ? Lnea directa: [phone] Comunquese  con un mdico si:  Usted o el beb desarrollan nuevos sntomas despus de usted comienza a Surveyor, mining.  Tiene problemas para producir leche o el suministro disminuye.  El beb no aumenta de peso luego de que usted comienza a usar un medicamento nuevo.  Tiene dolor en el pecho que no mejora con analgsicos de [place]. [place] de los medicamentos son seguros para que los use mientras amamanta porque la cantidad de medicamento que pasa a la [place] materna es muy pequea como para causar dao al beb.  No use ningn medicamento ni reciba ninguna vacuna sin haber hablado al respecto con su mdico y con el pediatra de su beb.  Si comienza a Therapist, sports, observe al beb para detectar si hay signos de somnolencia o irritabilidad. Esta informacin no tiene Marine scientist el consejo del mdico. Asegrese de hacerle al mdico cualquier pregunta que tenga. Document Released: 02/23/2017 Document Revised: 02/23/2017 Document Reviewed: 02/23/2017 Elsevier Interactive Patient Education  2019 [employer].

## 2018-07-29 NOTE — Progress Notes (Signed)
Acute Office Visit  Subjective:    Patient ID: Kari Ortega, female    DOB: 09-04-1980, 38 y.o.   MRN: [id number]  Chief Complaint  Patient presents with  . Back Pain    left side     HPI Patient is in today for an acute visit . She feels and sees a bump on her left side. I am unable to palpate or see area patient is concerned about. She is requesting pain medication. NO.   Past Medical History:  Diagnosis Date  . Anemia   . 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  Unable to determine reason per pt.    . Irregular periods     Past Surgical History:  Procedure Laterality Date  . CHOLECYSTECTOMY N/A 06/14/2018   Procedure: LAPAROSCOPIC CHOLECYSTECTOMY;  Surgeon: [name], [name], MD;  Location: [place];  Service: General;  Laterality: N/A;  . TOOTH EXTRACTION      Family History  Problem Relation Age of Onset  . Diabetes Mother     Social History   Socioeconomic History  . Marital status: Married    Spouse name: Not on file  . Number of children: Not on file  . Years of education: Not on file  . Highest education level: Not on file  Occupational History  . Not on file  Social Needs  . Financial resource strain: Not on file  . Food insecurity:    Worry: Not on file    Inability: Not on file  . Transportation needs:    Medical: Not on file    Non-medical: Not on file  Tobacco Use  . Smoking status: Never Smoker  . Smokeless tobacco: Never Used  Substance and Sexual Activity  . Alcohol use: No  . Drug use: No  . Sexual activity: Yes    Birth control/protection: None  Lifestyle  . Physical activity:    Days per week: Not on file    Minutes per session: Not on file  . Stress: Not on file  Relationships  . Social connections:    Talks on phone: Not on file    Gets together: Not on file    Attends religious service: Not on file    Active member of club or organization: Not on file    Attends meetings of clubs or  organizations: Not on file    Relationship status: Not on file  . Intimate partner violence:    Fear of current or ex partner: Not on file    Emotionally abused: Not on file    Physically abused: Not on file    Forced sexual activity: Not on file  Other Topics Concern  . Not on file  Social History Narrative  . Not on file    Outpatient Medications Prior to Visit  Medication Sig Dispense Refill  . acetaminophen (TYLENOL) 500 MG tablet Take 2 tablets (1,000 mg total) by mouth every 6 (six) hours as needed. 30 tablet 0  . Prenatal Vit-Fe Fumarate-FA (MULTIVITAMIN-PRENATAL) 27-0.8 MG TABS tablet Take 1 tablet by mouth daily at 12 noon.    Marland Kitchen ibuprofen (ADVIL,MOTRIN) 600 MG tablet Take 1 tablet (600 mg total) by mouth every 8 (eight) hours as needed. 30 tablet 0   Facility-Administered Medications Prior to Visit  Medication Dose Route Frequency Provider Last Rate Last Dose  . polyethylene glycol powder (GLYCOLAX/MIRALAX) container 255 g  1 Container Oral Once Rasch, Artist Pais, NP  No Known Allergies  Review of Systems  Constitutional: Negative.   HENT: Negative.   Eyes: Negative.   Respiratory: Negative.   Cardiovascular: Negative.   Gastrointestinal: Negative.   Genitourinary: Negative.   Musculoskeletal: Positive for back pain.       Bilateral knee pain  Skin: Negative.   Neurological: Negative.   Endo/Heme/Allergies: Negative.   Psychiatric/Behavioral:       Hypochondriac  Denies s/s for post pardon depression       Objective:    Physical Exam  Constitutional: She appears well-developed and well-nourished.  Neck: Normal range of motion. Neck supple.  Cardiovascular: Normal rate and regular rhythm.  Pulmonary/Chest: Effort normal and breath sounds normal.  Abdominal: Soft. Bowel sounds are normal.  Musculoskeletal: Normal range of motion.  Skin: Skin is warm.  Unable to visualize bump on left side or palpate   Psychiatric: She has a normal mood and affect.     BP 106/72 (BP Location: Right Arm, Patient Position: Sitting, Cuff Size: Normal)   Pulse 72   Temp 97.9 F (36.6 C) (Oral)   Ht 4\' 9"  (1.448 m)   Wt 160 lb (72.6 kg)   SpO2 91%   Breastfeeding Yes   BMI 34.62 kg/m  Wt Readings from Last 3 Encounters:  07/29/18 160 lb (72.6 kg)  07/07/18 160 lb 9.6 oz (72.8 kg)  06/13/18 170 lb 10.2 oz (77.4 kg)     Lab Results  Component Value Date   TSH 1.420 08/24/2017   Lab Results  Component Value Date   WBC 8.0 06/14/2018   HGB 11.3 (L) 06/14/2018   HCT 35.2 (L) 06/14/2018   MCV 84.0 06/14/2018   PLT 269 06/14/2018   Lab Results  Component Value Date   NA 141 06/14/2018   K 3.5 06/14/2018   CO2 23 06/14/2018   GLUCOSE 84 06/14/2018   BUN 11 06/14/2018   CREATININE 0.65 06/14/2018   BILITOT 0.9 06/14/2018   ALKPHOS 162 (H) 06/14/2018   AST 209 (H) 06/14/2018   ALT 253 (H) 06/14/2018   PROT 6.6 06/14/2018   ALBUMIN 3.1 (L) 06/14/2018   CALCIUM 8.4 (L) 06/14/2018   ANIONGAP 8 06/14/2018   No results found for: CHOL No results found for: HDL No results found for: LDLCALC No results found for: TRIG No results found for: CHOLHDL Lab Results  Component Value Date   HGBA1C 5.2 08/24/2017       Assessment & Plan:   Problem List Items Addressed This Visit    None       No orders of the defined types were placed in this encounter.    Kerin Perna, NP

## 2018-09-19 IMAGING — US US TRANSVAGINAL NON-OB
1 series · 13 of 25 positions shown · non-contrast
Comparison: None

CLINICAL DATA: Left lower quadrant and suprapubic pain for the past
2-3 weeks. Onset of last normal menstrual period was September 02, 2016.

EXAM:
TRANSABDOMINAL AND TRANSVAGINAL ULTRASOUND OF PELVIS
TECHNIQUE: Both transabdominal and transvaginal ultrasound examinations of the
pelvis were performed. Transabdominal technique was performed for
global imaging of the pelvis including uterus, ovaries, adnexal
regions, and pelvic cul-de-sac. It was necessary to proceed with
endovaginal exam following the transabdominal exam to visualize the
left ovary and left adnexal region.

[Series 1: us transvaginal non-ob · 0.21mm/px · 13 of 70 slices shown]
[im 1/70]
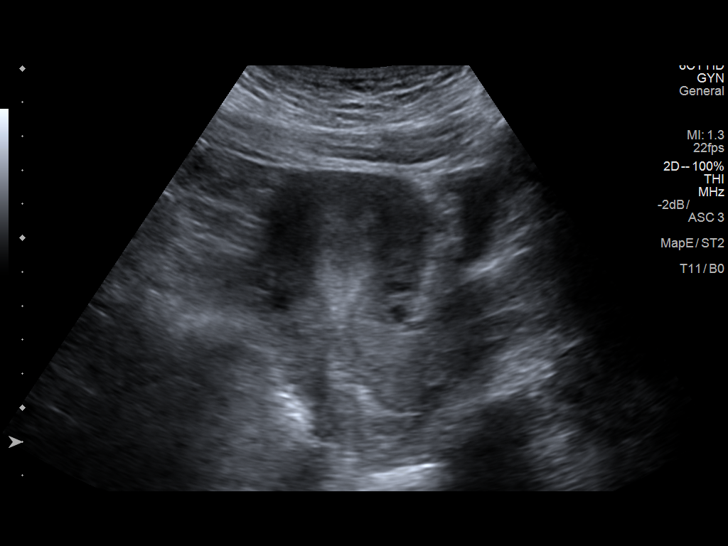
[im 6/70]
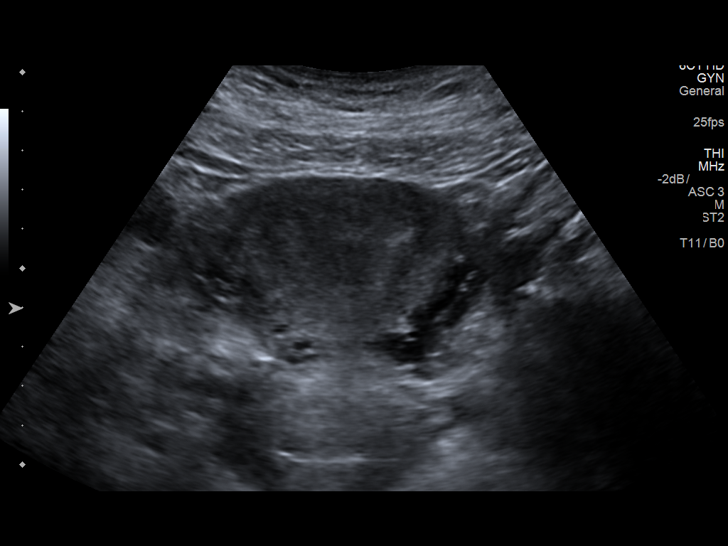
[im 12/70]
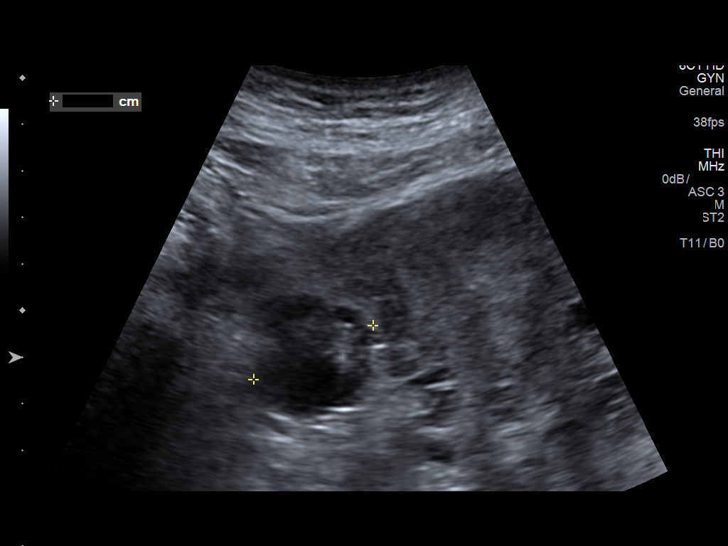
[im 18/70]
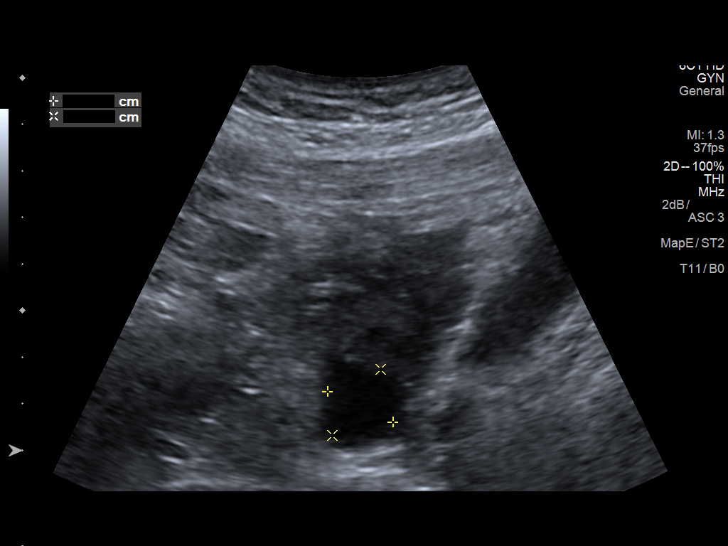
[im 24/70]
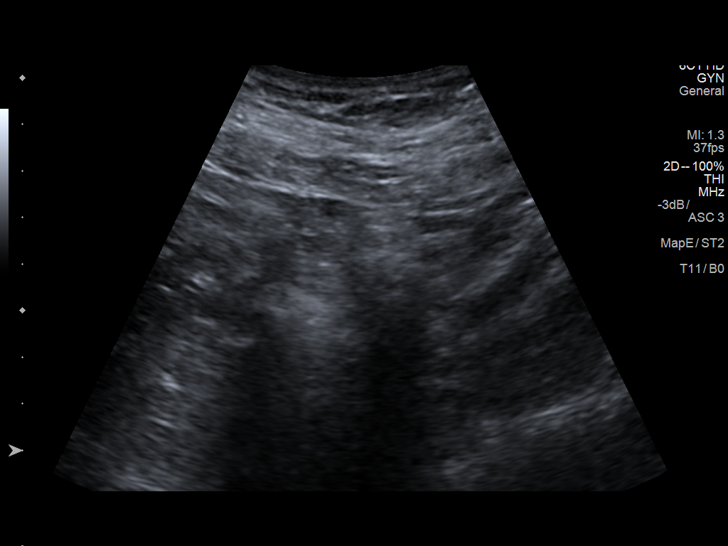
[im 29/70]
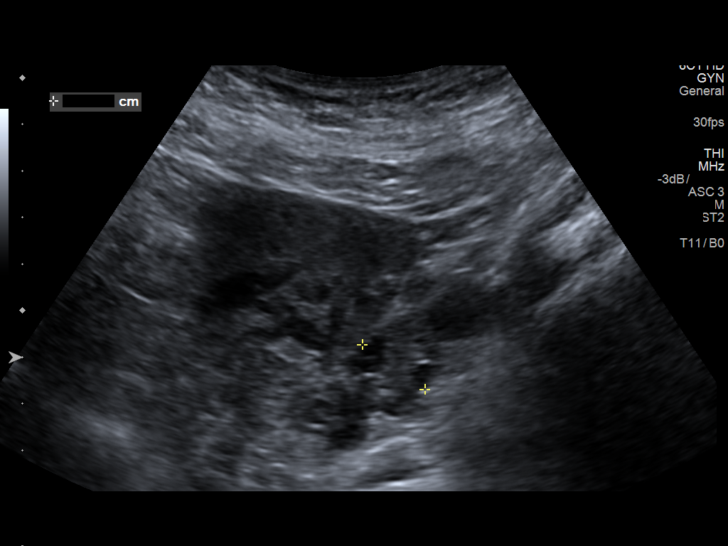
[im 35/70]
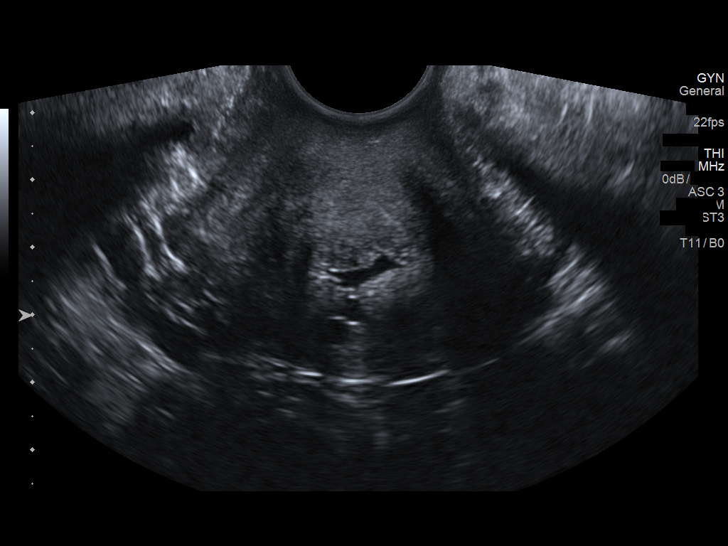
[im 41/70]
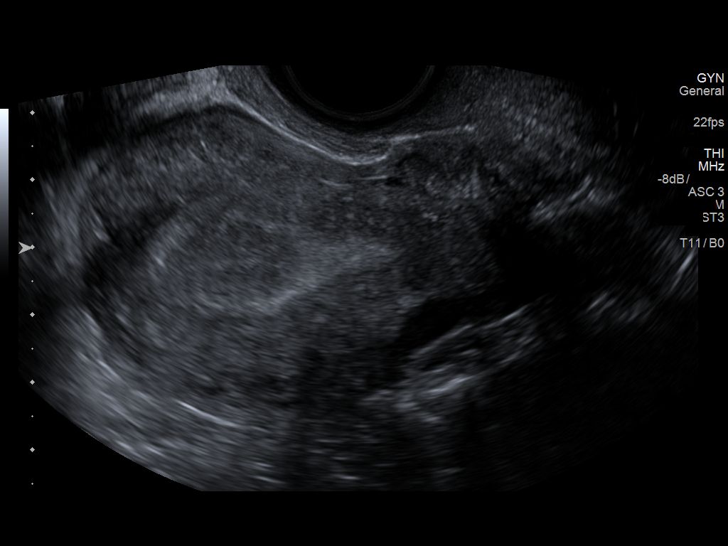
[im 47/70]
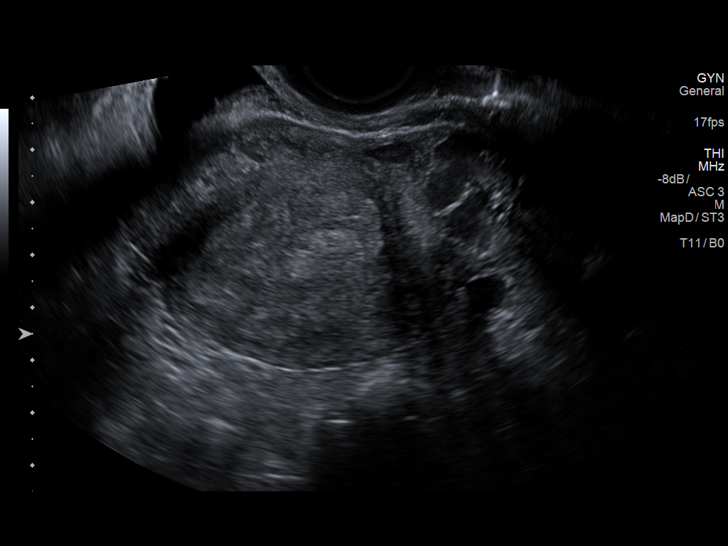
[im 52/70]
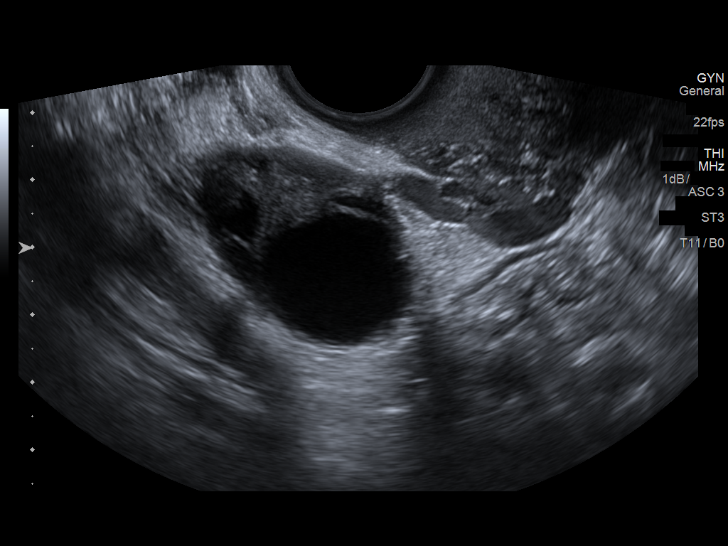
[im 58/70]
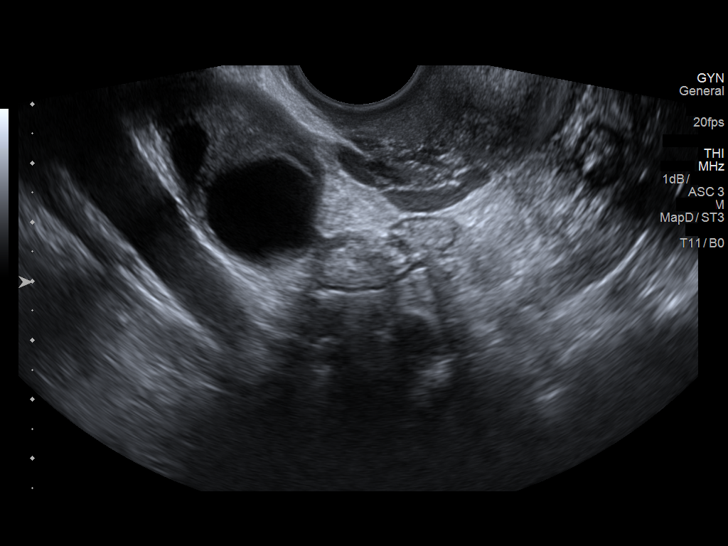
[im 64/70]
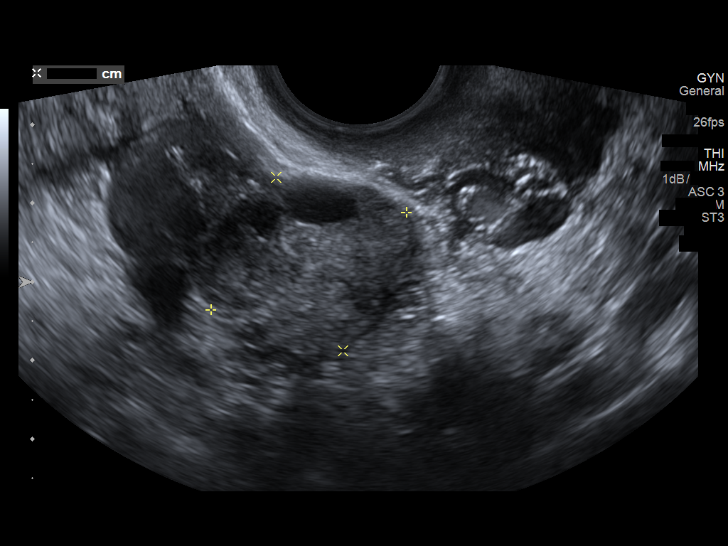
[im 70/70]
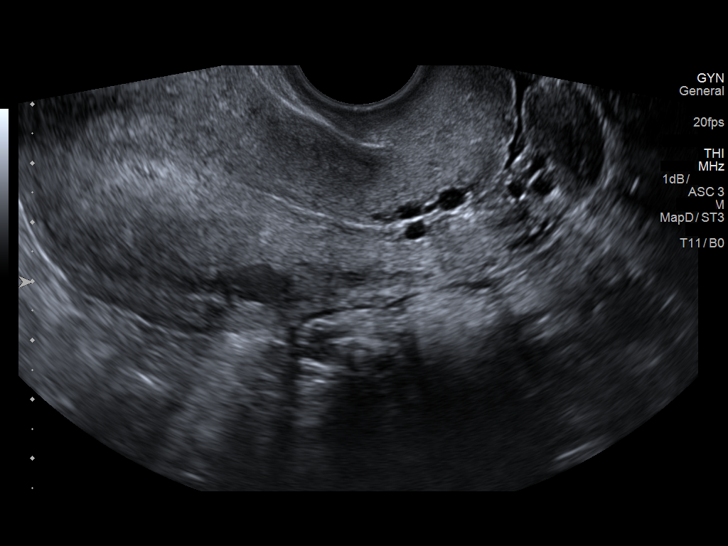

[13 of 25 positions shown; findings below may reference images not displayed]

FINDINGS: Uterus

Measurements: 9.5 x 4.8 x 5.7 cm. No fibroids or other mass
visualized. Nabothian cysts are present.

Endometrium

Thickness: 16.4 mm.  No focal abnormality visualized.

Right ovary

Measurements: 3.7 x 2.5 x 2.6 cm. There is a simple appearing right
ovarian cyst measuring 2.1 x 2 x 2.2 cm.

Left ovary

Measurements: 2.8 x 2.4 x 1.7 cm. Normal appearance/no adnexal mass.

Other findings

No abnormal free fluid.
IMPRESSION: Normal appearance of the uterus and endometrium.

Simple appearing right ovarian cyst. Normal-appearing left ovary and
left adnexal region.

If the patient's symptoms persist and the patient's beta HCG is
negative, abdominal and pelvic CT scanning may be the most useful
next imaging step.

## 2019-02-16 ENCOUNTER — Other Ambulatory Visit: Payer: Self-pay

## 2019-02-16 ENCOUNTER — Ambulatory Visit: Payer: Self-pay | Attending: Family Medicine

## 2019-03-04 ENCOUNTER — Encounter: Payer: Self-pay | Admitting: Family Medicine

## 2019-03-04 ENCOUNTER — Ambulatory Visit: Payer: Self-pay | Attending: Family Medicine | Admitting: Family Medicine

## 2019-03-04 ENCOUNTER — Other Ambulatory Visit: Payer: Self-pay

## 2019-03-04 DIAGNOSIS — M533 Sacrococcygeal disorders, not elsewhere classified: Secondary | ICD-10-CM

## 2019-03-04 DIAGNOSIS — Z789 Other specified health status: Secondary | ICD-10-CM

## 2019-03-04 DIAGNOSIS — R5383 Other fatigue: Secondary | ICD-10-CM

## 2019-03-04 DIAGNOSIS — Z603 Acculturation difficulty: Secondary | ICD-10-CM

## 2019-03-04 DIAGNOSIS — Z758 Other problems related to medical facilities and other health care: Secondary | ICD-10-CM

## 2019-03-04 DIAGNOSIS — M5489 Other dorsalgia: Secondary | ICD-10-CM

## 2019-03-04 NOTE — Progress Notes (Signed)
Est Care per pt her tailbone is always hurting

## 2019-03-04 NOTE — Progress Notes (Signed)
Established Patient Office Visit  Subjective:  Patient ID: Kari Ortega, female    DOB: July 11, 1980  Age: 38 y.o. MRN: [id number]  CC: No chief complaint on file.  Due to a language barrier, Stratus video interpretation system was used at today's visit  HPI Kari Ortega, 38 yo female with complaint of pain in her tailbone area since giving birth  In Nov of 2019.  Pain does not radiate to either leg.  Per patient the pain is constant and worse with sitting.  Pain is about a 7-8 on a 0-to-10 scale.  Pain is sharp.  When she is not sitting she still has some degree of discomfort, dull aching sensation in the tailbone area.  She denies any falls or other injury to this area.         She reports past history of surgery for removal of gallbladder after her pregnancy.  On review of chart, she appears to have last been seen by someone affiliated with this medical group, Domenica Fail, [place], on 12/22/2016 due to complaint of headaches which were thought to be tension type headaches.  She was also on medication at that time to help with fertility-Clomid which was also thought to be contributing to her headaches.  She reports no current issues with headaches.  She has also had history of facial paralysis.  Past Medical History:  Diagnosis Date  . Anemia   . 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  Unable to determine reason per pt.    . Irregular periods     Past Surgical History:  Procedure Laterality Date  . CHOLECYSTECTOMY N/A 06/14/2018   Procedure: LAPAROSCOPIC CHOLECYSTECTOMY;  Surgeon: [name], [name], MD;  Location: [facility];  Service: General;  Laterality: N/A;  . TOOTH EXTRACTION      Family History  Problem Relation Age of Onset  . Diabetes Mother     Social History   Socioeconomic History  . Marital status: Married    Spouse name: Not on file  . Number of children: Not on file  . Years of education: Not on file  . Highest  education level: Not on file  Occupational History  . Not on file  Social Needs  . Financial resource strain: Not on file  . Food insecurity    Worry: Not on file    Inability: Not on file  . Transportation needs    Medical: Not on file    Non-medical: Not on file  Tobacco Use  . Smoking status: Never Smoker  . Smokeless tobacco: Never Used  Substance and Sexual Activity  . Alcohol use: No  . Drug use: No  . Sexual activity: Yes    Birth control/protection: None  Lifestyle  . Physical activity    Days per week: Not on file    Minutes per session: Not on file  . Stress: Not on file  Relationships  . Social Herbalist on phone: Not on file    Gets together: Not on file    Attends religious service: Not on file    Active member of club or organization: Not on file    Attends meetings of clubs or organizations: Not on file    Relationship status: Not on file  . Intimate partner violence    Fear of current or ex partner: Not on file    Emotionally abused: Not on file    Physically abused: Not on file  Forced sexual activity: Not on file  Other Topics Concern  . Not on file  Social History Narrative  . Not on file    Outpatient Medications Prior to Visit  Medication Sig Dispense Refill  . acetaminophen (TYLENOL) 500 MG tablet Take 2 tablets (1,000 mg total) by mouth every 6 (six) hours as needed. 30 tablet 0  . Prenatal Vit-Fe Fumarate-FA (MULTIVITAMIN-PRENATAL) 27-0.8 MG TABS tablet Take 1 tablet by mouth daily at 12 noon.     Facility-Administered Medications Prior to Visit  Medication Dose Route Frequency Provider Last Rate Last Dose  . polyethylene glycol powder (GLYCOLAX/MIRALAX) container 255 g  1 Container Oral Once Rasch, Anderson Malta I, NP        No Known Allergies  ROS Review of Systems  Constitutional: Positive for fatigue (Mild, per patient due to having an infant and due to issues with chronic pain).  HENT: Negative for sore throat and trouble  swallowing.   Eyes: Negative for photophobia and visual disturbance.  Respiratory: Negative for cough and shortness of breath.   Cardiovascular: Negative for chest pain, palpitations and leg swelling.  Gastrointestinal: Negative for abdominal pain, blood in stool, constipation, diarrhea and nausea.  Endocrine: Negative for cold intolerance, heat intolerance, polydipsia, polyphagia and polyuria.  Genitourinary: Negative for difficulty urinating, dysuria, flank pain, frequency and hematuria.  Musculoskeletal: Positive for arthralgias and back pain.  Skin: Negative for rash and wound.  Neurological: Negative for dizziness and headaches.  Hematological: Negative for adenopathy. Does not bruise/bleed easily.  Psychiatric/Behavioral: Negative for self-injury and suicidal ideas. The patient is nervous/anxious (Concerned about chronic pain).       Objective:    Physical Exam  Constitutional: She is oriented to person, place, and time. She appears well-developed and well-nourished.  Neck: Normal range of motion. Neck supple. No thyromegaly present.  Cardiovascular: Normal rate and regular rhythm.  Pulmonary/Chest: Effort normal and breath sounds normal.  Abdominal: Soft. There is no abdominal tenderness. There is no rebound and no guarding.  Genitourinary:    Genitourinary Comments: Mild discomfort to external palpation of the lower sacral/coccyx area but patient also states that the pain seems to come from the inside as well   Musculoskeletal:        General: Tenderness present. No edema.     Comments: No CVA tenderness  Lymphadenopathy:    She has no cervical adenopathy.  Neurological: She is alert and oriented to person, place, and time.  Skin: Skin is warm and dry.  Psychiatric: She has a normal mood and affect. Her behavior is normal.  Nursing note and vitals reviewed.  Vital signs done but not entered by medical assistant; vital signs were normal on review Breastfeeding Yes  Wt  Readings from Last 3 Encounters:  07/29/18 160 lb (72.6 kg)  07/07/18 160 lb 9.6 oz (72.8 kg)  06/13/18 170 lb 10.2 oz (77.4 kg)     Health Maintenance Due  Topic Date Due  . INFLUENZA VACCINE  12/11/2018     Lab Results  Component Value Date   TSH 1.420 08/24/2017   Lab Results  Component Value Date   WBC 8.0 06/14/2018   HGB 11.3 (L) 06/14/2018   HCT 35.2 (L) 06/14/2018   MCV 84.0 06/14/2018   PLT 269 06/14/2018   Lab Results  Component Value Date   NA 141 06/14/2018   K 3.5 06/14/2018   CO2 23 06/14/2018   GLUCOSE 84 06/14/2018   BUN 11 06/14/2018   CREATININE 0.65 06/14/2018  BILITOT 0.9 06/14/2018   ALKPHOS 162 (H) 06/14/2018   AST 209 (H) 06/14/2018   ALT 253 (H) 06/14/2018   PROT 6.6 06/14/2018   ALBUMIN 3.1 (L) 06/14/2018   CALCIUM 8.4 (L) 06/14/2018   ANIONGAP 8 06/14/2018   No results found for: CHOL No results found for: HDL No results found for: LDLCALC No results found for: TRIG No results found for: [facility] Lab Results  Component Value Date   HGBA1C 5.2 08/24/2017      Assessment & Plan:  1. Pain in the coccyx Patient reports issues with coccydynia since childbirth.  Patient will be referred to both gynecology and orthopedics for further evaluation and treatment.  She may take over-the-counter medications as needed to help with the pain.  Continue use of a pillow or cushion when sitting to help with pain relief. - Ambulatory referral to Gynecology - AMB referral to orthopedics  2.  Language barrier Stratus video interpretation system used to help with language barrier at today's visit  An After Visit Summary was printed and given to the patient.  Follow-up: Return for Schedule for annual well exam and follow-up as needed.   Antony Blackbird, MD

## 2019-03-14 ENCOUNTER — Ambulatory Visit: Payer: Self-pay | Admitting: Family Medicine

## 2019-03-18 ENCOUNTER — Encounter: Payer: Self-pay | Admitting: Family Medicine

## 2019-03-18 ENCOUNTER — Ambulatory Visit: Payer: Self-pay

## 2019-03-18 ENCOUNTER — Other Ambulatory Visit: Payer: Self-pay

## 2019-03-18 ENCOUNTER — Ambulatory Visit (INDEPENDENT_AMBULATORY_CARE_PROVIDER_SITE_OTHER): Payer: Self-pay | Admitting: Family Medicine

## 2019-03-18 DIAGNOSIS — M5442 Lumbago with sciatica, left side: Secondary | ICD-10-CM

## 2019-03-18 DIAGNOSIS — M533 Sacrococcygeal disorders, not elsewhere classified: Secondary | ICD-10-CM

## 2019-03-18 MED ORDER — MELOXICAM 15 MG PO TABS: 7.5000 mg | ORAL_TABLET | Freq: Every day | ORAL | 6 refills | Status: DC | PRN

## 2019-03-18 MED ORDER — VITAMIN D-3 125 MCG (5000 UT) PO TABS: 1.0000 | ORAL_TABLET | Freq: Every day | ORAL | 3 refills | Status: DC

## 2019-03-18 NOTE — Progress Notes (Signed)
Office Visit Note   Patient: Kari Ortega           Date of Birth: 09/24/1980           MRN: [id number] Visit Date: 03/18/2019 Requested by: Antony Blackbird, MD [place],  [place] [address] PCP: Kerin Perna, NP  Subjective: Chief Complaint  Patient presents with  . Lower Back - Pain    Pain around the coccyx since childbirth 1 year ago - the doctor was pressing with his thumb rather hard in that area. Hurts all the time, but is worse with sitting. Does have occasional pain and weakness down the left leg.    HPI: She is here with tailbone pain.  She is is present with an interpreter.  1 year ago during childbirth, she started noticing pain in the coccyx area.  She has had problems ever since.  It hurts all the time but especially when sitting.  Occasionally pain radiates into the left leg.  She is breast-feeding her baby, only able to take Tylenol for pain but it does not really seem to be helping.  No problems in this area prior to last year.  Denies any troubles with bowel or bladder function.               ROS: No fevers or chills.  All other systems were reviewed and are negative.  Objective: Vital Signs: There were no vitals taken for this visit.  Physical Exam:  General:  Alert and oriented, in no acute distress. Pulm:  Breathing unlabored. Psy:  Normal mood, congruent affect.  Female chaperone was present.  She is point tender over the distal sacrum and coccyx.  No pain on the lumbar spine or in the sciatic notch areas.  Straight leg raise is negative, lower extremity strength and reflexes are normal.  Imaging: X-rays lumbar spine: Well-preserved disc spaces, normal alignment.  X-ray sacrum and coccyx: Normal alignment, no sign of fracture or dislocation.  Assessment & Plan: 1.  Coccydynia 1 year status post childbirth, suspect irritation of the joints but cannot rule out lumbar disc protrusion given her occasional left leg pain. -We  will try physical therapy.  Vitamin D3 in case she is deficient.  If not improving after physical therapy, then lumbar MRI scan.  If MRI is negative, then possibly could consider referral for a coccyx injection.     Procedures: No procedures performed  No notes on file     PMFS History: Patient Active Problem List   Diagnosis Date Noted  . Symptomatic cholelithiasis 06/13/2018  . Postpartum care and examination 04/29/2018  . Urine frequency 04/29/2018  . Normal labor 03/18/2018  . 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 BMI 30-39.9 09/21/2017  . Obesity in pregnancy 09/21/2017  . Language barrier 09/21/2017  . Supervision of high risk pregnancy, antepartum 08/24/2017  . AMA (advanced maternal age) multigravida 35+ 08/24/2017  . HYPERTRIGLYCERIDEMIA 12/15/2006   Past Medical History:  Diagnosis Date  . Anemia   . 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  Unable to determine reason per pt.    . Irregular periods     Family History  Problem Relation Age of Onset  . Diabetes Mother     Past Surgical History:  Procedure Laterality Date  . CHOLECYSTECTOMY N/A 06/14/2018   Procedure: LAPAROSCOPIC CHOLECYSTECTOMY;  Surgeon: [name], [name], MD;  Location: [place] [facility];  Service: General;  Laterality: N/A;  . TOOTH EXTRACTION  Social History   Occupational History  . Not on file  Tobacco Use  . Smoking status: Never Smoker  . Smokeless tobacco: Never Used  Substance and Sexual Activity  . Alcohol use: No  . Drug use: No  . Sexual activity: Yes    Birth control/protection: None

## 2019-04-04 ENCOUNTER — Encounter: Payer: Self-pay | Admitting: Physical Therapy

## 2019-04-04 ENCOUNTER — Ambulatory Visit: Payer: Self-pay | Attending: Family Medicine | Admitting: Physical Therapy

## 2019-04-04 ENCOUNTER — Other Ambulatory Visit: Payer: Self-pay

## 2019-04-04 DIAGNOSIS — M5442 Lumbago with sciatica, left side: Secondary | ICD-10-CM | POA: Insufficient documentation

## 2019-04-04 DIAGNOSIS — M533 Sacrococcygeal disorders, not elsewhere classified: Secondary | ICD-10-CM | POA: Insufficient documentation

## 2019-04-04 DIAGNOSIS — G8929 Other chronic pain: Secondary | ICD-10-CM | POA: Insufficient documentation

## 2019-04-04 DIAGNOSIS — M6281 Muscle weakness (generalized): Secondary | ICD-10-CM | POA: Insufficient documentation

## 2019-04-04 NOTE — Therapy (Signed)
[place], [place], [address] Phone: [phone]   Fax:  [phone]  Physical Therapy Evaluation  Patient Details  Name: Kari Ortega MRN: [id number] Date of Birth: 1981-02-24 Referring Provider (PT): Geryl Rankins, MD   Encounter Date: 04/04/2019  PT End of Session - 04/04/19 1647    Visit Number  1    Number of Visits  8    Date for PT Re-Evaluation  05/30/19    Authorization Type  CAFA 100%    PT Start Time  [id number]    PT Stop Time  1530    PT Time Calculation (min)  45 min    Activity Tolerance  Patient tolerated treatment well    Behavior During Therapy  [facility] for tasks assessed/performed       Past Medical History:  Diagnosis Date  . Anemia   . 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  Unable to determine reason per pt.    . Irregular periods     Past Surgical History:  Procedure Laterality Date  . CHOLECYSTECTOMY N/A 06/14/2018   Procedure: LAPAROSCOPIC CHOLECYSTECTOMY;  Surgeon: [name], [name], MD;  Location: [facility];  Service: General;  Laterality: N/A;  . TOOTH EXTRACTION      There were no vitals filed for this visit.   Subjective Assessment - 04/04/19 1503    Subjective  Pt reporting pain in low back and coccyx which began 03/18/2018 after her baby was born. Pt reporting pain all the time, pt reporting pain with standing, sitting and walking.    Pertinent History  post partum 1 year,    Limitations  Sitting;Standing;Walking;Lifting    Diagnostic tests  X-ray reveals no fx    Patient Stated Goals  Stop hurting when I move    Currently in Pain?  Yes    Pain Score  6     Pain Location  Coccyx    Pain Descriptors / Indicators  Aching    Pain Type  Chronic pain    Pain Radiating Towards  low back    Pain Onset  More than a month ago    Aggravating Factors   sitting, transitions, transitions    Pain Relieving Factors  tylenol    Effect of  Pain on Daily Activities  painful to sit and breast feed my child         [facility] PT Assessment - 04/04/19 0001      Assessment   Medical Diagnosis  coccyx pain, low back pain with L side sciatica    Referring Provider (PT)  Geryl Rankins, MD    Onset Date/Surgical Date  --   "1 year ago when I gave birth to my son"   Hand Dominance  Right    Prior Therapy  no      Precautions   Precautions  None      Restrictions   Weight Bearing Restrictions  No      Balance Screen   Has the patient fallen in the past 6 months  No    Is the patient reluctant to leave their home because of a fear of falling?   No      Home Social worker  Private residence    Living Arrangements  Spouse/significant other;Children    Type of [place] to enter    Entrance Stairs-Number of Steps  3  Entrance Stairs-Rails  Right;Cannot reach both      Prior Function   Level of Independence  Independent    Vocation  Unemployed    Leisure  I have 3 boys      Cognition   Overall Cognitive Status  Within Functional Limits for tasks assessed      Observation/Other Assessments   Focus on Therapeutic Outcomes (FOTO)   NT      Posture/Postural Control   Posture/Postural Control  Postural limitations    Postural Limitations  Rounded Shoulders;Forward head;Decreased lumbar lordosis      ROM / Strength   AROM / PROM / Strength  AROM;Strength      AROM   Overall AROM Comments  bilateral hip and knees WFL, Lumbar spine WFL limited by mild pain with flexion and rotation to each side      Strength   Overall Strength  Deficits    Strength Assessment Site  Hip;Knee    Right/Left Hip  Right;Left    Right Hip Flexion  4/5    Right Hip Extension  4-/5    Left Hip Flexion  4-/5    Left Hip Extension  4-/5    Left Hip ABduction  4-/5    Left Hip ADduction  4-/5    Right/Left Knee  Right;Left    Right Knee Flexion  4+/5    Right Knee Extension  4+/5    Left Knee  Flexion  4+/5    Left Knee Extension  4+/5      Flexibility   Soft Tissue Assessment /Muscle Length  yes    Hamstrings  R: 65 degrees, L: 50 degrees     Piriformis  Tightness noted more of L side      Palpation   Palpation comment  TTP along sacral border, coccyx, and bilateral piriformis      Special Tests    Special Tests  Lumbar;Sacrolliac Tests    Lumbar Tests  Straight Leg Raise      Transfers   Five time sit to stand comments   25 seconds with UE support      Ambulation/Gait   Gait Pattern  Antalgic;Wide base of support                Objective measurements completed on examination: See above findings.              PT Education - 04/04/19 1647    Education Details  HEP, PT POC    Person(s) Educated  Patient    Methods  Explanation;Demonstration;Handout    Comprehension  Verbalized understanding;Returned demonstration       PT Short Term Goals - 04/04/19 1638      PT SHORT TERM GOAL #1   Title  Pt will be independent in her HEP.    Time  2    Period  Weeks    Status  New        PT Long Term Goals - 04/04/19 1638      PT LONG TERM GOAL #1   Title  Pt will improve her bilateral LE strength to grossly 5/5 in order to improve functional mobility.    Time  8    Period  Weeks    Status  New    Target Date  05/30/19      PT LONG TERM GOAL #2   Title  Pt able to perform 5 time sit to stand in </= 10 seconds.    Baseline  25.  seconds using UE support (04/04/2019)    Time  8    Period  Weeks    Status  New    Target Date  05/30/19      PT LONG TERM GOAL #3   Title  Pt will be able to report no pain while sitting >/= 30 minutes.    Baseline  pt reports difficulty when breast feeding her child and she has to change positions often due to pain    Time  8    Period  Weeks    Status  New    Target Date  05/30/19      PT LONG TERM GOAL #4   Title  Pt will be able to amb 1/2 mile on community surfaces with no pain reported.    Baseline   Pain varies    Time  8    Period  Weeks    Status  New    Target Date  05/30/19      PT LONG TERM GOAL #5   Title  Pt will be able to bend and lift objects (20 pounds) off the floor with correct body mechanics and no pain reported.    Baseline  pain in buttock with lifitng    Time  8    Period  Weeks    Status  New    Target Date  05/30/19             Plan - 04/04/19 1523    Clinical Impression Statement  Pt presenting with coccyx pain and low back pain which radiates into L thigh and knee. Pt reporting pain has been going on since the birth of her last child 1 year ago.Pt with tenderness to palpation along sacral/lumbar border, lateral sacral sulcus, coccyx, and bilateral piriformis. Pt with mild limitation in her LE strength with L weaker than R. Pt with negative pelvic compression test and negative straight Leg test bilaterally. Pt with no reports of bowel and bladder difficulties and no reports of constipation. Pt did however mention that sitting long periods and transitions seem to increase her pain.    Examination-Activity Limitations  Sit;Stand;Sleep;Caring for Others    Examination-Participation Restrictions  Other    Stability/Clinical Decision Making  Evolving/Moderate complexity    Clinical Decision Making  Moderate    Rehab Potential  Good    PT Frequency  1x / week    PT Duration  8 weeks    PT Treatment/Interventions  ADLs/Self Care Home Management;Moist Heat;Electrical Stimulation;Cryotherapy;Traction;Ultrasound;Therapeutic activities;Functional mobility training;Stair training;Gait training;Therapeutic exercise;Balance training;Neuromuscular re-education;Patient/family education;Manual techniques;Passive range of motion;Dry needling;Taping    PT Next Visit Plan  lumbar stretching, hamstring stretching, IT band stretching, piriformis stretch, Manual therapy to lumbar spine and piriformis,    PT Home Exercise Plan  Access Code: [email or web address]    Consulted and Agree with Plan  of Care  Patient       Patient will benefit from skilled therapeutic intervention in order to improve the following deficits and impairments:  Pain, Postural dysfunction, Obesity, Decreased strength, Impaired flexibility, Decreased mobility, Difficulty walking  Visit Diagnosis: Coccyx pain  Chronic bilateral low back pain with left-sided sciatica  Muscle weakness (generalized)     Problem List Patient Active Problem List   Diagnosis Date Noted  . Symptomatic cholelithiasis 06/13/2018  . Postpartum care and examination 04/29/2018  . Urine frequency 04/29/2018  . Normal labor 03/18/2018  . 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 BMI 30-39.9 09/21/2017  . Obesity in pregnancy  09/21/2017  . Language barrier 09/21/2017  . Supervision of high risk pregnancy, antepartum 08/24/2017  . AMA (advanced maternal age) multigravida 35+ 08/24/2017  . HYPERTRIGLYCERIDEMIA 12/15/2006    Oretha Caprice, PT 04/04/2019, 4:50 PM  [facility] [address] [place], [place], [address] Phone: [phone]   Fax:  [phone]  Name: Kari Ortega MRN: [id number] Date of Birth: 10-31-1980

## 2019-04-11 ENCOUNTER — Ambulatory Visit: Payer: Self-pay | Admitting: Family Medicine

## 2019-04-20 ENCOUNTER — Encounter: Payer: Self-pay | Admitting: Physical Therapy

## 2019-04-20 ENCOUNTER — Ambulatory Visit: Payer: Self-pay | Attending: Family Medicine | Admitting: Physical Therapy

## 2019-04-20 ENCOUNTER — Other Ambulatory Visit: Payer: Self-pay

## 2019-04-20 DIAGNOSIS — M5442 Lumbago with sciatica, left side: Secondary | ICD-10-CM | POA: Insufficient documentation

## 2019-04-20 DIAGNOSIS — M6281 Muscle weakness (generalized): Secondary | ICD-10-CM | POA: Insufficient documentation

## 2019-04-20 DIAGNOSIS — G8929 Other chronic pain: Secondary | ICD-10-CM | POA: Insufficient documentation

## 2019-04-20 DIAGNOSIS — M533 Sacrococcygeal disorders, not elsewhere classified: Secondary | ICD-10-CM | POA: Insufficient documentation

## 2019-04-20 NOTE — Therapy (Signed)
[place], [place], [address] Phone: [phone]   Fax:  [phone]  Physical Therapy Treatment  Patient Details  Name: Kari Ortega MRN: [id number] Date of Birth: 1980-10-06 Referring Provider (PT): Geryl Rankins, MD   Encounter Date: 04/20/2019  PT End of Session - 04/20/19 1506    Visit Number  2    Number of Visits  8    Date for PT Re-Evaluation  05/30/19    Authorization Type  CAFA 100%    PT Start Time  1504    PT Stop Time  1544    PT Time Calculation (min)  40 min    Activity Tolerance  Patient tolerated treatment well    Behavior During Therapy  [facility] for tasks assessed/performed       Past Medical History:  Diagnosis Date  . Anemia   . 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  Unable to determine reason per pt.    . Irregular periods     Past Surgical History:  Procedure Laterality Date  . CHOLECYSTECTOMY N/A 06/14/2018   Procedure: LAPAROSCOPIC CHOLECYSTECTOMY;  Surgeon: [name], [name], MD;  Location: [facility];  Service: General;  Laterality: N/A;  . TOOTH EXTRACTION      There were no vitals filed for this visit.  Subjective Assessment - 04/20/19 1506    Subjective  I still feel like the very tip of my spine hurts. It feels a little bit better than last time.    Patient Stated Goals  Stop hurting when I move    Currently in Pain?  Yes    Pain Score  5     Pain Location  Sacrum    Pain Orientation  Medial;Lower    Pain Descriptors / Indicators  Sore    Aggravating Factors   standing up                       [facility] Adult PT Treatment/Exercise - 04/20/19 0001      Exercises   Exercises  Knee/Hip      Knee/Hip Exercises: Stretches   Passive Hamstring Stretch Limitations  supine with strap- midling & lateral    Piriformis Stretch Limitations  supine push & pull across      Knee/Hip Exercises: Supine   Hip Adduction  Isometric Limitations  pillow bs knees with posterior pelvic tilt    Other Supine Knee/Hip Exercises  hooklying march with pelvic tilt      Manual Therapy   Manual Therapy  Soft tissue mobilization    Soft tissue mobilization  bil piriformis (most on Lt) & sacral anchor with passive Lt hip IR             PT Education - 04/20/19 1545    Education Details  anatomy of condition    Person(s) Educated  Patient    Methods  Explanation;Handout    Comprehension  Verbalized understanding;Need further instruction       PT Short Term Goals - 04/04/19 1638      PT SHORT TERM GOAL #1   Title  Pt will be independent in her HEP.    Time  2    Period  Weeks    Status  New        PT Long Term Goals - 04/04/19 1638      PT LONG TERM GOAL #1   Title  Pt will improve her bilateral  LE strength to grossly 5/5 in order to improve functional mobility.    Time  8    Period  Weeks    Status  New    Target Date  05/30/19      PT LONG TERM GOAL #2   Title  Pt able to perform 5 time sit to stand in </= 10 seconds.    Baseline  25. seconds using UE support (04/04/2019)    Time  8    Period  Weeks    Status  New    Target Date  05/30/19      PT LONG TERM GOAL #3   Title  Pt will be able to report no pain while sitting >/= 30 minutes.    Baseline  pt reports difficulty when breast feeding her child and she has to change positions often due to pain    Time  8    Period  Weeks    Status  New    Target Date  05/30/19      PT LONG TERM GOAL #4   Title  Pt will be able to amb 1/2 mile on community surfaces with no pain reported.    Baseline  Pain varies    Time  8    Period  Weeks    Status  New    Target Date  05/30/19      PT LONG TERM GOAL #5   Title  Pt will be able to bend and lift objects (20 pounds) off the floor with correct body mechanics and no pain reported.    Baseline  pain in buttock with lifitng    Time  8    Period  Weeks    Status  New    Target Date  05/30/19             Plan - 04/20/19 1544    Clinical Impression Statement  Significant tightness in Lt piriformis decreased with manual therapy and pt reported less pain at coccyx. Progressed home stretches and strengthening to engage core.    PT Treatment/Interventions  ADLs/Self Care Home Management;Moist Heat;Electrical Stimulation;Cryotherapy;Traction;Ultrasound;Therapeutic activities;Functional mobility training;Stair training;Gait training;Therapeutic exercise;Balance training;Neuromuscular re-education;Patient/family education;Manual techniques;Passive range of motion;Dry needling;Taping    PT Next Visit Plan  lumbar stretching, hamstring stretching, IT band stretching, piriformis stretch, Manual therapy to lumbar spine and piriformis,    PT Home Exercise Plan  Access Code: [email or web address]    Consulted and Agree with Plan of Care  Patient       Patient will benefit from skilled therapeutic intervention in order to improve the following deficits and impairments:  Pain, Postural dysfunction, Obesity, Decreased strength, Impaired flexibility, Decreased mobility, Difficulty walking  Visit Diagnosis: Coccyx pain  Chronic bilateral low back pain with left-sided sciatica  Muscle weakness (generalized)     Problem List Patient Active Problem List   Diagnosis Date Noted  . Symptomatic cholelithiasis 06/13/2018  . Postpartum care and examination 04/29/2018  . Urine frequency 04/29/2018  . Normal labor 03/18/2018  . 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 BMI 30-39.9 09/21/2017  . Obesity in pregnancy 09/21/2017  . Language barrier 09/21/2017  . Supervision of high risk pregnancy, antepartum 08/24/2017  . AMA (advanced maternal age) multigravida 35+ 08/24/2017  . HYPERTRIGLYCERIDEMIA 12/15/2006   Emmalynn Pinkham C. Sherard Sutch PT, DPT 04/20/19 3:46 PM   [facility] [address] [place], [place], [address] Phone: [phone]   Fax:   [phone]  Name: Kari Ortega MRN:  [id number] Date of Birth: 02/20/1981

## 2019-04-27 ENCOUNTER — Ambulatory Visit: Payer: Self-pay | Admitting: Physical Therapy

## 2019-04-27 ENCOUNTER — Encounter: Payer: Self-pay | Admitting: Physical Therapy

## 2019-04-27 ENCOUNTER — Other Ambulatory Visit: Payer: Self-pay

## 2019-04-27 DIAGNOSIS — M533 Sacrococcygeal disorders, not elsewhere classified: Secondary | ICD-10-CM

## 2019-04-27 DIAGNOSIS — G8929 Other chronic pain: Secondary | ICD-10-CM

## 2019-04-27 DIAGNOSIS — M5442 Lumbago with sciatica, left side: Secondary | ICD-10-CM

## 2019-04-27 DIAGNOSIS — M6281 Muscle weakness (generalized): Secondary | ICD-10-CM

## 2019-04-27 NOTE — Therapy (Signed)
[place], [place], [address] Phone: [phone]   Fax:  [phone]  Physical Therapy Treatment  Patient Details  Name: Kari Ortega MRN: [id number] Date of Birth: May 21, 1980 Referring Provider (PT): Geryl Rankins, MD   Encounter Date: 04/27/2019  PT End of Session - 04/27/19 1505    Visit Number  3    Number of Visits  8    Date for PT Re-Evaluation  05/30/19    Authorization Type  CAFA 100%    PT Start Time  1503    PT Stop Time  [id number]    PT Time Calculation (min)  41 min    Activity Tolerance  Patient tolerated treatment well    Behavior During Therapy  [facility] for tasks assessed/performed       Past Medical History:  Diagnosis Date  . Anemia   . 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  Unable to determine reason per pt.    . Irregular periods     Past Surgical History:  Procedure Laterality Date  . CHOLECYSTECTOMY N/A 06/14/2018   Procedure: LAPAROSCOPIC CHOLECYSTECTOMY;  Surgeon: [name], [name], MD;  Location: [place];  Service: General;  Laterality: N/A;  . TOOTH EXTRACTION      There were no vitals filed for this visit.  Subjective Assessment - 04/27/19 1505    Subjective  Soso because I have had a little bit of pain this week. The pain feels different, a little less. It does not hurt as much walking as it does sitting now.    Patient Stated Goals  Stop hurting when I move    Pain Score  4     Pain Location  Sacrum    Pain Orientation  Lower;Medial    Pain Descriptors / Indicators  Aching;Sore    Aggravating Factors   sitting    Pain Relieving Factors  stretches                       [facility] Adult PT Treatment/Exercise - 04/27/19 0001      Knee/Hip Exercises: Stretches   Passive Hamstring Stretch Limitations  passive stretch by PT    Piriformis Stretch Limitations  supine push & pull across      Knee/Hip Exercises:  Sidelying   Hip ABduction  20 reps;Left    Clams  bil 20 reps      Manual Therapy   Soft tissue mobilization  Lt piriformis & bil FA IR/ER stretching             PT Education - 04/27/19 1538    Education Details  anatomy of condition    Person(s) Educated  Patient    Methods  Explanation;Demonstration;Tactile cues;Verbal cues;Handout    Comprehension  Verbalized understanding;Returned demonstration;Verbal cues required;Tactile cues required;Need further instruction       PT Short Term Goals - 04/04/19 1638      PT SHORT TERM GOAL #1   Title  Pt will be independent in her HEP.    Time  2    Period  Weeks    Status  New        PT Long Term Goals - 04/04/19 1638      PT LONG TERM GOAL #1   Title  Pt will improve her bilateral LE strength to grossly 5/5 in order to improve functional mobility.    Time  8    Period  Weeks  Status  New    Target Date  05/30/19      PT LONG TERM GOAL #2   Title  Pt able to perform 5 time sit to stand in </= 10 seconds.    Baseline  25. seconds using UE support (04/04/2019)    Time  8    Period  Weeks    Status  New    Target Date  05/30/19      PT LONG TERM GOAL #3   Title  Pt will be able to report no pain while sitting >/= 30 minutes.    Baseline  pt reports difficulty when breast feeding her child and she has to change positions often due to pain    Time  8    Period  Weeks    Status  New    Target Date  05/30/19      PT LONG TERM GOAL #4   Title  Pt will be able to amb 1/2 mile on community surfaces with no pain reported.    Baseline  Pain varies    Time  8    Period  Weeks    Status  New    Target Date  05/30/19      PT LONG TERM GOAL #5   Title  Pt will be able to bend and lift objects (20 pounds) off the floor with correct body mechanics and no pain reported.    Baseline  pain in buttock with lifitng    Time  8    Period  Weeks    Status  New    Target Date  05/30/19            Plan - 04/27/19 1535     Clinical Impression Statement  Time taken today to show pt pictures of hip anatomy and explain her pain. Reported a ball of air in her hip which was found to be a trigger point in illiopsoas. Added strengthening to hip abductors for support to hip. Fatigued with repetitions, added to HEP. Reported decreased pain upon standing at the end of the session today whereas in the past she could hardly get out of the bed due to pain. Has a 40 mo old baby.    PT Treatment/Interventions  ADLs/Self Care Home Management;Moist Heat;Electrical Stimulation;Cryotherapy;Traction;Ultrasound;Therapeutic activities;Functional mobility training;Stair training;Gait training;Therapeutic exercise;Balance training;Neuromuscular re-education;Patient/family education;Manual techniques;Passive range of motion;Dry needling;Taping    PT Next Visit Plan  quadruped exercises, child pose    PT Home Exercise Plan  Access Code: [email or web address]    Consulted and Agree with Plan of Care  Patient       Patient will benefit from skilled therapeutic intervention in order to improve the following deficits and impairments:  Pain, Postural dysfunction, Obesity, Decreased strength, Impaired flexibility, Decreased mobility, Difficulty walking  Visit Diagnosis: Coccyx pain  Chronic bilateral low back pain with left-sided sciatica  Muscle weakness (generalized)     Problem List Patient Active Problem List   Diagnosis Date Noted  . Symptomatic cholelithiasis 06/13/2018  . Postpartum care and examination 04/29/2018  . Urine frequency 04/29/2018  . Normal labor 03/18/2018  . 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 BMI 30-39.9 09/21/2017  . Obesity in pregnancy 09/21/2017  . Language barrier 09/21/2017  . Supervision of high risk pregnancy, antepartum 08/24/2017  . AMA (advanced maternal age) multigravida 35+ 08/24/2017  . HYPERTRIGLYCERIDEMIA 12/15/2006   Vista Sawatzky C. Hailie Searight PT, DPT 04/27/19 3:47 PM   [place]  [place], [place], [address] Phone: [phone]   Fax:  [phone]  Name: Charlese Ashburn MRN: [id number] Date of Birth: Sep 05, 1980

## 2019-05-04 ENCOUNTER — Ambulatory Visit: Payer: Self-pay | Admitting: Physical Therapy

## 2019-05-10 ENCOUNTER — Ambulatory Visit: Payer: Self-pay | Admitting: Physical Therapy

## 2019-05-10 ENCOUNTER — Other Ambulatory Visit: Payer: Self-pay

## 2019-05-10 DIAGNOSIS — M6281 Muscle weakness (generalized): Secondary | ICD-10-CM

## 2019-05-10 DIAGNOSIS — G8929 Other chronic pain: Secondary | ICD-10-CM

## 2019-05-10 DIAGNOSIS — M533 Sacrococcygeal disorders, not elsewhere classified: Secondary | ICD-10-CM

## 2019-05-10 NOTE — Patient Instructions (Signed)
Access Code: [email or web address]  URL: [email or web address]  Date: 05/10/2019  Prepared by: Raeford Razor   Exercises  Supine Hamstring Stretch with Strap - 3 reps - 1 sets - 2-3x daily - 7x weekly  Supine Piriformis Stretch with Foot on Ground - 3 reps - 1 sets - 30d hold - 2-3x daily - 7x weekly  Seated Piriformis Stretch - 2 reps - 1 sets - 30s hold - 2-3x daily - 7x weekly  Modified Thomas Stretch - 2 reps - 1 sets - 30s hold - 2-3x daily - 7x weekly  Supine Hip Adduction Isometric with Ball - 10 reps - 3 sets - 2-3x daily - 7x weekly  Supine Bridge - 10 reps - 3 sets - 1x daily - 7x weekly  Sidelying Hip Abduction - 20 reps - 2 sets - 1x daily - 7x weekly  Child's Pose Stretch - 10 reps - 2 sets - 30 hold - 1x daily - 7x weekly  Clamshell - 20 reps - 2 sets - 1x daily - 7x weekly

## 2019-05-11 NOTE — Therapy (Signed)
[place], [place], [address] Phone: [phone]   Fax:  [phone]  Physical Therapy Treatment  Patient Details  Name: Kari Ortega MRN: [id number] Date of Birth: 1981-05-11 Referring Provider (PT): Geryl Rankins, MD   Encounter Date: 05/10/2019  PT End of Session - 05/10/19 1616    Visit Number  4    Number of Visits  8    Date for PT Re-Evaluation  05/30/19    Authorization Type  CAFA 100%    PT Start Time  1534    PT Stop Time  1626    PT Time Calculation (min)  52 min    Activity Tolerance  Patient tolerated treatment well    Behavior During Therapy  [facility] for tasks assessed/performed       Past Medical History:  Diagnosis Date  . Anemia   . 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  Unable to determine reason per pt.    . Irregular periods     Past Surgical History:  Procedure Laterality Date  . CHOLECYSTECTOMY N/A 06/14/2018   Procedure: LAPAROSCOPIC CHOLECYSTECTOMY;  Surgeon: [name], [name], MD;  Location: [place];  Service: General;  Laterality: N/A;  . TOOTH EXTRACTION      There were no vitals filed for this visit.  Subjective Assessment - 05/10/19 1539    Subjective  The pain this week is very mild, but all of this week I had pain in sitting , she thinks she may have been sitting a bit more.    Currently in Pain?  Yes    Pain Score  3     Pain Location  Back    Pain Orientation  Lower         [facility] Adult PT Treatment/Exercise - 05/11/19 0001      Lumbar Exercises: Supine   Bent Knee Raise  10 reps    Bent Knee Raise Limitations  done from 90/90 and then modified to do from hooklying      Lumbar Exercises: Quadruped   Madcat/Old Horse  10 reps    Madcat/Old Horse Limitations  max cues     Other Quadruped Lumbar Exercises  childs pose x 3, wide knees       Knee/Hip Exercises: Stretches   Piriformis Stretch  Both;3 reps    Piriformis Stretch Limitations  push and pull       Knee/Hip Exercises: Supine   Bridges  Strengthening;Both;1 set;10 reps    Bridges Limitations  option to take wide knees for comfort as well       Modalities   Modalities  Moist Heat      Moist Heat Therapy   Number Minutes Moist Heat  10 Minutes    Moist Heat Location  Lumbar Spine      Manual Therapy   Soft tissue mobilization  L>R piriformis compression with hip ER./IR.               PT Education - 05/11/19 0603    Education Details  discussed option of pelvic floor specialist, HEP, core    Person(s) Educated  Patient    Methods  Explanation;Demonstration;Tactile cues    Comprehension  Verbalized understanding;Returned demonstration       PT Short Term Goals - 04/04/19 1638      PT SHORT TERM GOAL #1   Title  Pt will be independent in her HEP.    Time  2  Period  Weeks    Status  New        PT Long Term Goals - 04/04/19 1638      PT LONG TERM GOAL #1   Title  Pt will improve her bilateral LE strength to grossly 5/5 in order to improve functional mobility.    Time  8    Period  Weeks    Status  New    Target Date  05/30/19      PT LONG TERM GOAL #2   Title  Pt able to perform 5 time sit to stand in </= 10 seconds.    Baseline  25. seconds using UE support (04/04/2019)    Time  8    Period  Weeks    Status  New    Target Date  05/30/19      PT LONG TERM GOAL #3   Title  Pt will be able to report no pain while sitting >/= 30 minutes.    Baseline  pt reports difficulty when breast feeding her child and she has to change positions often due to pain    Time  8    Period  Weeks    Status  New    Target Date  05/30/19      PT LONG TERM GOAL #4   Title  Pt will be able to amb 1/2 mile on community surfaces with no pain reported.    Baseline  Pain varies    Time  8    Period  Weeks    Status  New    Target Date  05/30/19      PT LONG TERM GOAL #5   Title  Pt will be able to bend and lift  objects (20 pounds) off the floor with correct body mechanics and no pain reported.    Baseline  pain in buttock with lifitng    Time  8    Period  Weeks    Status  New    Target Date  05/30/19            Plan - 05/10/19 1542    Clinical Impression Statement  Introduced quadruped exercises for relief of pain in coccyx, low back.  Supine 90/90 ab ex increase back pain, modified to be effective for her. Does her exercises and has noticed a decrease in pain.  Explained options for further treatment if needed, including pelvic floor specialist.    PT Treatment/Interventions  ADLs/Self Care Home Management;Moist Heat;Electrical Stimulation;Cryotherapy;Traction;Ultrasound;Therapeutic activities;Functional mobility training;Stair training;Gait training;Therapeutic exercise;Balance training;Neuromuscular re-education;Patient/family education;Manual techniques;Passive range of motion;Dry needling;Taping    PT Next Visit Plan  quadruped exercises, child pose, cont manual to lumbar Sacral    PT Home Exercise Plan  Access Code: [email or web address]    Consulted and Agree with Plan of Care  Patient       Patient will benefit from skilled therapeutic intervention in order to improve the following deficits and impairments:  Pain, Postural dysfunction, Obesity, Decreased strength, Impaired flexibility, Decreased mobility, Difficulty walking  Visit Diagnosis: Coccyx pain  Chronic bilateral low back pain with left-sided sciatica  Muscle weakness (generalized)     Problem List Patient Active Problem List   Diagnosis Date Noted  . Symptomatic cholelithiasis 06/13/2018  . Postpartum care and examination 04/29/2018  . Urine frequency 04/29/2018  . Normal labor 03/18/2018  . 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 BMI 30-39.9 09/21/2017  . Obesity in pregnancy 09/21/2017  . Language barrier 09/21/2017  . Supervision  of high risk pregnancy, antepartum 08/24/2017  . AMA (advanced maternal age) multigravida 35+  08/24/2017  . HYPERTRIGLYCERIDEMIA 12/15/2006    Anan Dapolito 05/11/2019, 6:08 AM  [facility] [address] [place], [place], [address] Phone: [phone]   Fax:  [phone]  Name: Kari Ortega MRN: [id number] Date of Birth: [birth year]  Raeford Razor, PT 05/11/19 6:08 AM Phone: [phone] Fax: [phone]

## 2019-05-18 ENCOUNTER — Encounter: Payer: Self-pay | Admitting: Physical Therapy

## 2019-05-18 ENCOUNTER — Ambulatory Visit: Payer: Self-pay | Attending: Family Medicine | Admitting: Physical Therapy

## 2019-05-18 ENCOUNTER — Other Ambulatory Visit: Payer: Self-pay

## 2019-05-18 DIAGNOSIS — G8929 Other chronic pain: Secondary | ICD-10-CM | POA: Insufficient documentation

## 2019-05-18 DIAGNOSIS — M6281 Muscle weakness (generalized): Secondary | ICD-10-CM | POA: Insufficient documentation

## 2019-05-18 DIAGNOSIS — M533 Sacrococcygeal disorders, not elsewhere classified: Secondary | ICD-10-CM | POA: Insufficient documentation

## 2019-05-18 DIAGNOSIS — M5442 Lumbago with sciatica, left side: Secondary | ICD-10-CM | POA: Insufficient documentation

## 2019-05-18 NOTE — Therapy (Signed)
[place], [place], [address] Phone: [phone]   Fax:  [phone]  Physical Therapy Treatment  Patient Details  Name: Kari Ortega MRN: [id number] Date of Birth: 04/01/1981 Referring Provider (PT): Geryl Rankins, MD   Encounter Date: 05/18/2019  PT End of Session - 05/18/19 1107    Visit Number  5    Number of Visits  8    Date for PT Re-Evaluation  05/30/19    Authorization Type  CAFA 100%    PT Start Time  1104    PT Stop Time  1143    PT Time Calculation (min)  39 min    Activity Tolerance  Patient tolerated treatment well    Behavior During Therapy  [facility] for tasks assessed/performed       Past Medical History:  Diagnosis Date  . Anemia   . 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  Unable to determine reason per pt.    . Irregular periods     Past Surgical History:  Procedure Laterality Date  . CHOLECYSTECTOMY N/A 06/14/2018   Procedure: LAPAROSCOPIC CHOLECYSTECTOMY;  Surgeon: [name], [name], MD;  Location: [place];  Service: General;  Laterality: N/A;  . TOOTH EXTRACTION      There were no vitals filed for this visit.  Subjective Assessment - 05/18/19 1107    Subjective  I have started having the pain yesterday around my tailbone. I just hugged my baby yesterday but nothing extra. Exercises last time were helping.    Patient Stated Goals  Stop hurting when I move    Currently in Pain?  Yes    Pain Score  6     Pain Location  Other (Comment)   tailbone   Pain Descriptors / Indicators  Sore    Aggravating Factors   sitting    Pain Relieving Factors  stretches                       [facility] Adult PT Treatment/Exercise - 05/18/19 0001      Lumbar Exercises: Quadruped   Other Quadruped Lumbar Exercises  quadruped x3, 3 breaths each      Knee/Hip Exercises: Stretches   Passive Hamstring Stretch Limitations  seated EOB    Piriformis Stretch Limitations  seated EOB      Knee/Hip Exercises: Seated   Sit to Sand  5 reps;3 sets      Manual Therapy   Manual therapy comments  skilled palpation and monitoring during TPDN    Soft tissue mobilization  bil piriformis & gluts       Trigger Point Dry Needling - 05/18/19 0001    Consent Given?  Yes    Education Handout Provided  --   verbal education   Muscles Treated Back/Hip  Gluteus medius;Piriformis    Gluteus Medius Response  Twitch response elicited;Palpable increased muscle length   bilat   Piriformis Response  Twitch response elicited;Palpable increased muscle length   bilat          PT Education - 05/18/19 1144    Education Details  POC & pelvic floor PT, TPDN & expected outcomes    Person(s) Educated  Patient    Methods  Explanation;Demonstration;Tactile cues;Verbal cues    Comprehension  Verbalized understanding;Returned demonstration;Verbal cues required;Tactile cues required;Need further instruction       PT Short Term Goals - 04/04/19 1638      PT SHORT  TERM GOAL #1   Title  Pt will be independent in her HEP.    Time  2    Period  Weeks    Status  New        PT Long Term Goals - 04/04/19 1638      PT LONG TERM GOAL #1   Title  Pt will improve her bilateral LE strength to grossly 5/5 in order to improve functional mobility.    Time  8    Period  Weeks    Status  New    Target Date  05/30/19      PT LONG TERM GOAL #2   Title  Pt able to perform 5 time sit to stand in </= 10 seconds.    Baseline  25. seconds using UE support (04/04/2019)    Time  8    Period  Weeks    Status  New    Target Date  05/30/19      PT LONG TERM GOAL #3   Title  Pt will be able to report no pain while sitting >/= 30 minutes.    Baseline  pt reports difficulty when breast feeding her child and she has to change positions often due to pain    Time  8    Period  Weeks    Status  New    Target Date  05/30/19      PT LONG TERM GOAL #4   Title   Pt will be able to amb 1/2 mile on community surfaces with no pain reported.    Baseline  Pain varies    Time  8    Period  Weeks    Status  New    Target Date  05/30/19      PT LONG TERM GOAL #5   Title  Pt will be able to bend and lift objects (20 pounds) off the floor with correct body mechanics and no pain reported.    Baseline  pain in buttock with lifitng    Time  8    Period  Weeks    Status  New    Target Date  05/30/19            Plan - 05/18/19 1115    Clinical Impression Statement  2 more appointments in [place] and will continue with appointments here, if she continues to have pain, she will see pelvic floor specialist. Added DN to address tension in hip musculature and pt reported soreness following as expected. Encouraged her to move around frequently today to avoid getting stiff.    PT Treatment/Interventions  ADLs/Self Care Home Management;Moist Heat;Electrical Stimulation;Cryotherapy;Traction;Ultrasound;Therapeutic activities;Functional mobility training;Stair training;Gait training;Therapeutic exercise;Balance training;Neuromuscular re-education;Patient/family education;Manual techniques;Passive range of motion;Dry needling;Taping    PT Next Visit Plan  outcome of DN?  core stabilization    PT Home Exercise Plan  Access Code: [email or web address]    Consulted and Agree with Plan of Care  Patient       Patient will benefit from skilled therapeutic intervention in order to improve the following deficits and impairments:  Pain, Postural dysfunction, Obesity, Decreased strength, Impaired flexibility, Decreased mobility, Difficulty walking  Visit Diagnosis: Coccyx pain  Chronic bilateral low back pain with left-sided sciatica  Muscle weakness (generalized)     Problem List Patient Active Problem List   Diagnosis Date Noted  . Symptomatic cholelithiasis 06/13/2018  . Postpartum care and examination 04/29/2018  . Urine frequency 04/29/2018  . Normal labor 03/18/2018  .  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 BMI 30-39.9 09/21/2017  . Obesity in pregnancy 09/21/2017  . Language barrier 09/21/2017  . Supervision of high risk pregnancy, antepartum 08/24/2017  . AMA (advanced maternal age) multigravida 35+ 08/24/2017  . HYPERTRIGLYCERIDEMIA 12/15/2006   Ensley Blas C. Samvel Zinn PT, DPT 05/18/19 11:46 AM   [facility] [address] [place], [place], [address] Phone: [phone]   Fax:  [phone]  Name: Kari Ortega MRN: [id number] Date of Birth: 1980-06-05

## 2019-05-25 ENCOUNTER — Ambulatory Visit: Payer: Self-pay | Admitting: Physical Therapy

## 2019-05-25 ENCOUNTER — Other Ambulatory Visit: Payer: Self-pay

## 2019-05-25 ENCOUNTER — Encounter: Payer: Self-pay | Admitting: Physical Therapy

## 2019-05-25 DIAGNOSIS — M533 Sacrococcygeal disorders, not elsewhere classified: Secondary | ICD-10-CM

## 2019-05-25 DIAGNOSIS — G8929 Other chronic pain: Secondary | ICD-10-CM

## 2019-05-25 DIAGNOSIS — M6281 Muscle weakness (generalized): Secondary | ICD-10-CM

## 2019-05-25 NOTE — Therapy (Signed)
[place], [place], [address] Phone: [phone]   Fax:  [phone]  Physical Therapy Treatment  Patient Details  Name: Kari Ortega MRN: [id number] Date of Birth: June 01, 1980 Referring Provider (PT): Geryl Rankins, MD   Encounter Date: 05/25/2019  PT End of Session - 05/25/19 1510    Visit Number  6    Number of Visits  8    Date for PT Re-Evaluation  05/30/19    Authorization Type  CAFA 100%    PT Start Time  1506    PT Stop Time  1545    PT Time Calculation (min)  39 min    Activity Tolerance  Patient tolerated treatment well    Behavior During Therapy  [facility] for tasks assessed/performed       Past Medical History:  Diagnosis Date  . Anemia   . 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  Unable to determine reason per pt.    . Irregular periods     Past Surgical History:  Procedure Laterality Date  . CHOLECYSTECTOMY N/A 06/14/2018   Procedure: LAPAROSCOPIC CHOLECYSTECTOMY;  Surgeon: [name], [name], MD;  Location: [place];  Service: General;  Laterality: N/A;  . TOOTH EXTRACTION      There were no vitals filed for this visit.  Subjective Assessment - 05/25/19 1509    Subjective  Coccyx and back of Left leg hurt. I haven't had a lot of pain, it only hurts sometimes.    Patient Stated Goals  Stop hurting when I move    Currently in Pain?  Yes    Pain Score  3     Pain Location  --   coccyx   Pain Descriptors / Indicators  Sore    Aggravating Factors   sitting                       [facility] Adult PT Treatment/Exercise - 05/25/19 0001      Therapeutic Activites    Therapeutic Activities  Other Therapeutic Activities    Other Therapeutic Activities  set up for breast feeding      Knee/Hip Exercises: Stretches   Active Hamstring Stretch Limitations  supine, holding behind thigh      Manual Therapy   Manual therapy comments  skilled  palpation and monitoring during TPDN    Soft tissue mobilization  Lt hamstrings       Trigger Point Dry Needling - 05/25/19 0001    Muscles Treated Lower Quadrant  Hamstring    Hamstring Response  Palpable increased muscle length;Twitch response elicited   LEft            PT Short Term Goals - 04/04/19 1638      PT SHORT TERM GOAL #1   Title  Pt will be independent in her HEP.    Time  2    Period  Weeks    Status  New        PT Long Term Goals - 04/04/19 1638      PT LONG TERM GOAL #1   Title  Pt will improve her bilateral LE strength to grossly 5/5 in order to improve functional mobility.    Time  8    Period  Weeks    Status  New    Target Date  05/30/19      PT LONG TERM GOAL #2   Title  Pt able to  perform 5 time sit to stand in </= 10 seconds.    Baseline  25. seconds using UE support (04/04/2019)    Time  8    Period  Weeks    Status  New    Target Date  05/30/19      PT LONG TERM GOAL #3   Title  Pt will be able to report no pain while sitting >/= 30 minutes.    Baseline  pt reports difficulty when breast feeding her child and she has to change positions often due to pain    Time  8    Period  Weeks    Status  New    Target Date  05/30/19      PT LONG TERM GOAL #4   Title  Pt will be able to amb 1/2 mile on community surfaces with no pain reported.    Baseline  Pain varies    Time  8    Period  Weeks    Status  New    Target Date  05/30/19      PT LONG TERM GOAL #5   Title  Pt will be able to bend and lift objects (20 pounds) off the floor with correct body mechanics and no pain reported.    Baseline  pain in buttock with lifitng    Time  8    Period  Weeks    Status  New    Target Date  05/30/19            Plan - 05/25/19 1537    Clinical Impression Statement  DN reduced hamstring pain but continues to have coccyx pain in a seated position. past xrays were negative for fracture. suspect ligamentous injury. We used pillows and mat  to recreate setting at home when she is breast feeding and she is going to try this to improve mechanics. Discussed possible MRI if needed but we will extend POC if appropriate at next appointmnet.    PT Treatment/Interventions  ADLs/Self Care Home Management;Moist Heat;Electrical Stimulation;Cryotherapy;Traction;Ultrasound;Therapeutic activities;Functional mobility training;Stair training;Gait training;Therapeutic exercise;Balance training;Neuromuscular re-education;Patient/family education;Manual techniques;Passive range of motion;Dry needling;Taping    PT Next Visit Plan  ERO & recert    PT Home Exercise Plan  Access Code: [email or web address]    Consulted and Agree with Plan of Care  Patient       Patient will benefit from skilled therapeutic intervention in order to improve the following deficits and impairments:  Pain, Postural dysfunction, Obesity, Decreased strength, Impaired flexibility, Decreased mobility, Difficulty walking  Visit Diagnosis: Coccyx pain  Chronic bilateral low back pain with left-sided sciatica  Muscle weakness (generalized)     Problem List Patient Active Problem List   Diagnosis Date Noted  . Symptomatic cholelithiasis 06/13/2018  . Postpartum care and examination 04/29/2018  . Urine frequency 04/29/2018  . Normal labor 03/18/2018  . 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 BMI 30-39.9 09/21/2017  . Obesity in pregnancy 09/21/2017  . Language barrier 09/21/2017  . Supervision of high risk pregnancy, antepartum 08/24/2017  . AMA (advanced maternal age) multigravida 35+ 08/24/2017  . HYPERTRIGLYCERIDEMIA 12/15/2006    Clydie Dillen C. Daelan Gatt PT, DPT 05/25/19 3:52 PM   [facility] [address] [place], [place], [address] Phone: [phone]   Fax:  [phone]  Name: Kari Ortega MRN: [id number] Date of Birth: 15-Feb-1981

## 2019-06-01 ENCOUNTER — Ambulatory Visit: Payer: Self-pay | Admitting: Physical Therapy

## 2019-06-01 ENCOUNTER — Other Ambulatory Visit: Payer: Self-pay

## 2019-06-01 ENCOUNTER — Encounter: Payer: Self-pay | Admitting: Physical Therapy

## 2019-06-01 DIAGNOSIS — G8929 Other chronic pain: Secondary | ICD-10-CM

## 2019-06-01 DIAGNOSIS — M6281 Muscle weakness (generalized): Secondary | ICD-10-CM

## 2019-06-01 DIAGNOSIS — M533 Sacrococcygeal disorders, not elsewhere classified: Secondary | ICD-10-CM

## 2019-06-01 NOTE — Therapy (Signed)
[place], [place], [address] Phone: [phone]   Fax:  [phone]  Physical Therapy Treatment/Discharge  Patient Details  Name: Dean Wonder MRN: [id number] Date of Birth: 07/06/1980 Referring Provider (PT): Geryl Rankins, MD   Encounter Date: 06/01/2019  PT End of Session - 06/01/19 1517    Visit Number  7    Number of Visits  8    Date for PT Re-Evaluation  05/30/19    Authorization Type  CAFA 100%    PT Start Time  1500    PT Stop Time  1548    PT Time Calculation (min)  48 min    Activity Tolerance  Patient limited by pain    Behavior During Therapy  [facility] for tasks assessed/performed       Past Medical History:  Diagnosis Date  . Anemia   . 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  Unable to determine reason per pt.    . Irregular periods     Past Surgical History:  Procedure Laterality Date  . CHOLECYSTECTOMY N/A 06/14/2018   Procedure: LAPAROSCOPIC CHOLECYSTECTOMY;  Surgeon: [name], [name], MD;  Location: [place];  Service: General;  Laterality: N/A;  . TOOTH EXTRACTION      There were no vitals filed for this visit.  Subjective Assessment - 06/01/19 1511    Subjective  Continued coccyx pain. When I am laying back and get up is when it hurts.    Patient Stated Goals  Stop hurting when I move    Currently in Pain?  Yes    Pain Score  6     Pain Location  Coccyx    Pain Orientation  Mid    Pain Descriptors / Indicators  Aching;Dull    Aggravating Factors   sitting, getting up after sitting    Pain Relieving Factors  stretches helped temporarily         [facility] PT Assessment - 06/01/19 0001      Assessment   Medical Diagnosis  coccyx pain, low back pain with L side sciatica    Referring Provider (PT)  Geryl Rankins, MD    Onset Date/Surgical Date  --   1 year ago when I gave birth to my son   Hand Dominance  Right      Flexibility    Hamstrings  bil 80 deg      Palpation   Palpation comment  pain at end range hip IR on Left, mild end range pain in Rt IR; coccyx pain with Lt FA joint compression and mild on Rt      Straight Leg Raise   Findings  Negative                   [facility] Adult PT Treatment/Exercise - 06/01/19 0001      Modalities   Modalities  Cryotherapy      Cryotherapy   Number Minutes Cryotherapy  10 Minutes    Cryotherapy Location  Other (comment)   sacrum/coccyx   Type of Cryotherapy  Ice pack             PT Education - 06/01/19 1544    Education Details  goals, POC    Person(s) Educated  Patient    Methods  Explanation    Comprehension  Verbalized understanding       PT Short Term Goals - 06/01/19 1517      PT SHORT TERM  GOAL #1   Title  Pt will be independent in her HEP.    Status  Achieved        PT Long Term Goals - 06/01/19 1517      PT LONG TERM GOAL #1   Title  Pt will improve her bilateral LE strength to grossly 5/5 in order to improve functional mobility.    Status  Achieved      PT LONG TERM GOAL #2   Title  Pt able to perform 5 time sit to stand in </= 10 seconds.    Baseline  39s with use of UE    Status  Not Met      PT LONG TERM GOAL #3   Title  Pt will be able to report no pain while sitting >/= 30 minutes.    Baseline  pain as soon as I sit at coccyx    Status  Not Met      PT LONG TERM GOAL #4   Title  Pt will be able to amb 1/2 mile on community surfaces with no pain reported.    Baseline  pain with walking    Status  Not Met      PT LONG TERM GOAL #5   Title  Pt will be able to bend and lift objects (20 pounds) off the floor with correct body mechanics and no pain reported.    Baseline  sometimes it hurts but not often    Status  Partially Met            Plan - 06/01/19 1538    Clinical Impression Statement  Pt continues to have severe levels of pain at midline coccyx unchanged by treatments. She demo improved flexibility  and passive mobility with decreased TTP to surrounding hip musculature. Severe pain limiting ability to perform sit<>stand. Concordant pain upon palpation to coccyx at midline as well as Rt and Left border (Left worse than Rt). Suspecting sacrotuberous ligament injury. I am sending her back to Dr Junius Roads with request for further imaging and referral to pelvic floor rehab. Pt verbalized understanding.    PT Treatment/Interventions  ADLs/Self Care Home Management;Moist Heat;Electrical Stimulation;Cryotherapy;Traction;Ultrasound;Therapeutic activities;Functional mobility training;Stair training;Gait training;Therapeutic exercise;Balance training;Neuromuscular re-education;Patient/family education;Manual techniques;Passive range of motion;Dry needling;Taping    PT Home Exercise Plan  Access Code: [email or web address]    Consulted and Agree with Plan of Care  Patient       Patient will benefit from skilled therapeutic intervention in order to improve the following deficits and impairments:  Pain, Postural dysfunction, Obesity, Decreased strength, Impaired flexibility, Decreased mobility, Difficulty walking  Visit Diagnosis: Coccyx pain  Chronic bilateral low back pain with left-sided sciatica  Muscle weakness (generalized)     Problem List Patient Active Problem List   Diagnosis Date Noted  . Symptomatic cholelithiasis 06/13/2018  . Postpartum care and examination 04/29/2018  . Urine frequency 04/29/2018  . Normal labor 03/18/2018  . 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 BMI 30-39.9 09/21/2017  . Obesity in pregnancy 09/21/2017  . Language barrier 09/21/2017  . Supervision of high risk pregnancy, antepartum 08/24/2017  . AMA (advanced maternal age) multigravida 35+ 08/24/2017  . HYPERTRIGLYCERIDEMIA 12/15/2006   PHYSICAL THERAPY DISCHARGE SUMMARY  Visits from Start of Care: 7  Current functional level related to goals / functional outcomes: See above   Remaining deficits: See above   Education  / Equipment: Anatomy of condition, POC, HEP, exercise form/rationale  Plan: Patient agrees to discharge.  Patient goals were not  met. Patient is being discharged due to lack of progress.  ?????     Nasario Czerniak C. Jerzey Komperda PT, DPT 06/01/19 3:45 PM   [facility] [address] [place], [place], [address] Phone: [phone]   Fax:  [phone]  Name: Kristyn Obyrne MRN: [id number] Date of Birth: October 14, 1980

## 2019-06-06 ENCOUNTER — Other Ambulatory Visit: Payer: Self-pay

## 2019-06-06 ENCOUNTER — Encounter: Payer: Self-pay | Admitting: Family Medicine

## 2019-06-06 ENCOUNTER — Ambulatory Visit (INDEPENDENT_AMBULATORY_CARE_PROVIDER_SITE_OTHER): Payer: Self-pay | Admitting: Family Medicine

## 2019-06-06 DIAGNOSIS — M533 Sacrococcygeal disorders, not elsewhere classified: Secondary | ICD-10-CM

## 2019-06-06 DIAGNOSIS — M5442 Lumbago with sciatica, left side: Secondary | ICD-10-CM

## 2019-06-06 NOTE — Progress Notes (Signed)
   Office Visit Note   Patient: Kari Ortega           Date of Birth: 1980-10-24           MRN: [id number] Visit Date: 06/06/2019 Requested by: Kerin Perna, NP [address] [place],  [place] [address] PCP: Kerin Perna, NP  Subjective: Chief Complaint  Patient presents with  . Lower Back - Pain, Follow-up    Follow up for coccyx pain - constant pain from waistline down to coccyx. Today, it is not as severe. In PT - pain was too intense last Wednesday to go through with the session - supposed to go again this week.    HPI: She is here for follow-up chronic low back and tailbone pain.  She has been to multiple sessions of physical therapy and unfortunately, does not seem to be getting any better.  Continue pain in the midline when she sits, and occasional pain radiating into the left leg.  No numbness or weakness in her leg, no bowel or bladder dysfunction.  She is here with an interpreter today.                ROS:   All other systems were reviewed and are negative.  Objective: Vital Signs: There were no vitals taken for this visit.  Physical Exam:  General:  Alert and oriented, in no acute distress. Pulm:  Breathing unlabored. Psy:  Normal mood, congruent affect.  Low back: She is tender in the midline across the sacrum and at the coccyx.  She has pain in the left sciatic notch.  Straight leg raise negative, lower extremity strength and reflexes are normal.  Imaging: None today  Assessment & Plan: 1.  Persistent low back and tailbone pain with left-sided sciatica -We will order MRI to further evaluate.  Depending on the results, could contemplate referral for injection.     Procedures: No procedures performed  No notes on file     PMFS History: Patient Active Problem List   Diagnosis Date Noted  . Symptomatic cholelithiasis 06/13/2018  . Postpartum care and examination 04/29/2018  . Urine frequency 04/29/2018  . Normal labor  03/18/2018  . 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 BMI 30-39.9 09/21/2017  . Obesity in pregnancy 09/21/2017  . Language barrier 09/21/2017  . Supervision of high risk pregnancy, antepartum 08/24/2017  . AMA (advanced maternal age) multigravida 35+ 08/24/2017  . HYPERTRIGLYCERIDEMIA 12/15/2006   Past Medical History:  Diagnosis Date  . Anemia   . 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  Unable to determine reason per pt.    . Irregular periods     Family History  Problem Relation Age of Onset  . Diabetes Mother     Past Surgical History:  Procedure Laterality Date  . CHOLECYSTECTOMY N/A 06/14/2018   Procedure: LAPAROSCOPIC CHOLECYSTECTOMY;  Surgeon: [name], [name], MD;  Location: [place];  Service: General;  Laterality: N/A;  . TOOTH EXTRACTION     Social History   Occupational History  . Not on file  Tobacco Use  . Smoking status: Never Smoker  . Smokeless tobacco: Never Used  Substance and Sexual Activity  . Alcohol use: No  . Drug use: No  . Sexual activity: Yes    Birth control/protection: None

## 2019-06-08 ENCOUNTER — Encounter: Payer: Self-pay | Admitting: Physical Therapy

## 2019-06-09 ENCOUNTER — Ambulatory Visit: Payer: Self-pay | Admitting: Physical Therapy

## 2019-07-04 ENCOUNTER — Ambulatory Visit (INDEPENDENT_AMBULATORY_CARE_PROVIDER_SITE_OTHER): Payer: Self-pay | Admitting: Primary Care

## 2019-07-05 ENCOUNTER — Ambulatory Visit
Admission: RE | Admit: 2019-07-05 | Discharge: 2019-07-05 | Disposition: A | Discharge status: Home / Self Care | Payer: Self-pay | Source: Ambulatory Visit | Attending: Family Medicine | Admitting: Family Medicine

## 2019-07-05 ENCOUNTER — Other Ambulatory Visit: Payer: Self-pay

## 2019-07-05 DIAGNOSIS — M533 Sacrococcygeal disorders, not elsewhere classified: Secondary | ICD-10-CM

## 2019-07-05 DIAGNOSIS — M5442 Lumbago with sciatica, left side: Secondary | ICD-10-CM

## 2019-07-06 ENCOUNTER — Telehealth: Payer: Self-pay | Admitting: Family Medicine

## 2019-07-06 NOTE — Telephone Encounter (Signed)
MRI shows a disc bulge at L5-S1 which comes close to the S1 nerves.  Depending on what she's doing, the nerves could be irritated by this disc.  If still having pain, we could refer her to Dr. Ernestina Patches for an epidural steroid injection.

## 2019-07-06 NOTE — Telephone Encounter (Signed)
The sacrum/tailbone MRI looks normal, but the lumbar MRI is as previously stated.

## 2019-07-07 ENCOUNTER — Other Ambulatory Visit: Payer: Self-pay

## 2019-07-07 DIAGNOSIS — M5442 Lumbago with sciatica, left side: Secondary | ICD-10-CM

## 2019-07-07 DIAGNOSIS — M533 Sacrococcygeal disorders, not elsewhere classified: Secondary | ICD-10-CM

## 2019-07-07 NOTE — Telephone Encounter (Signed)
I called and advised the patient of her MRI results, through use of an interpreter (Adiel, # [phone]). She would like to proceed with the scheduling of the ESI.

## 2019-07-07 NOTE — Telephone Encounter (Signed)
Patient was advised, through use of interpreter, Adiel [phone].

## 2019-07-12 ENCOUNTER — Telehealth: Payer: Self-pay | Admitting: *Deleted

## 2019-07-12 DIAGNOSIS — M5442 Lumbago with sciatica, left side: Secondary | ICD-10-CM

## 2019-07-12 NOTE — Telephone Encounter (Signed)
That's fine, but I thought she wasn't getting better with therapy.

## 2019-07-12 NOTE — Telephone Encounter (Signed)
Please advise 

## 2019-07-12 NOTE — Telephone Encounter (Signed)
See other note sent to Dr Junius Roads.

## 2019-07-13 NOTE — Telephone Encounter (Signed)
Referral for PT placed in chart

## 2019-07-14 ENCOUNTER — Other Ambulatory Visit: Payer: Self-pay | Admitting: Family Medicine

## 2019-07-14 DIAGNOSIS — M533 Sacrococcygeal disorders, not elsewhere classified: Secondary | ICD-10-CM

## 2019-07-15 ENCOUNTER — Other Ambulatory Visit: Payer: Self-pay | Admitting: Physical Medicine and Rehabilitation

## 2019-07-15 DIAGNOSIS — F411 Generalized anxiety disorder: Secondary | ICD-10-CM

## 2019-07-15 MED ORDER — DIAZEPAM 5 MG PO TABS: ORAL_TABLET | ORAL | 0 refills | Status: DC

## 2019-07-15 NOTE — Progress Notes (Signed)
Pre-procedure diazepam ordered for pre-operative anxiety.  

## 2019-07-15 NOTE — Telephone Encounter (Signed)
Called pt and advised, she states that she would like to have a consult first to discuss with dr. Ernestina Patches about the injection. Pt is scheduled 08/02/19.

## 2019-07-15 NOTE — Telephone Encounter (Signed)
Done

## 2019-07-28 ENCOUNTER — Encounter: Payer: Self-pay | Admitting: Physical Medicine and Rehabilitation

## 2019-08-02 ENCOUNTER — Ambulatory Visit (INDEPENDENT_AMBULATORY_CARE_PROVIDER_SITE_OTHER): Payer: Self-pay | Admitting: Physical Medicine and Rehabilitation

## 2019-08-02 ENCOUNTER — Other Ambulatory Visit: Payer: Self-pay

## 2019-08-02 ENCOUNTER — Encounter: Payer: Self-pay | Admitting: Physical Medicine and Rehabilitation

## 2019-08-02 VITALS — BP 115/70 | HR 84 | Ht 58.27 in | Wt 165.0 lb

## 2019-08-02 DIAGNOSIS — G8929 Other chronic pain: Secondary | ICD-10-CM

## 2019-08-02 DIAGNOSIS — M545 Low back pain: Secondary | ICD-10-CM

## 2019-08-02 DIAGNOSIS — M5116 Intervertebral disc disorders with radiculopathy, lumbar region: Secondary | ICD-10-CM

## 2019-08-02 NOTE — Progress Notes (Signed)
 .  Numeric Pain Rating Scale and Functional Assessment Average Pain 7 Pain Right Now 2 My pain is intermittent and dull Pain is worse with: sitting and lifting Pain improves with: medication   In the last MONTH (on 0-10 scale) has pain interfered with the following?  1. General activity like being  able to carry out your everyday physical activities such as walking, climbing stairs, carrying groceries, or moving a chair?  Rating(7)  2. Relation with others like being able to carry out your usual social activities and roles such as  activities at home, at work and in your community. Rating(7)  3. Enjoyment of life such that you have  been bothered by emotional problems such as feeling anxious, depressed or irritable?  Rating(5)

## 2019-08-03 ENCOUNTER — Ambulatory Visit: Payer: Self-pay | Attending: Family Medicine | Admitting: Physical Therapy

## 2019-08-03 ENCOUNTER — Other Ambulatory Visit: Payer: Self-pay

## 2019-08-03 ENCOUNTER — Encounter: Payer: Self-pay | Admitting: Physical Medicine and Rehabilitation

## 2019-08-03 ENCOUNTER — Encounter: Payer: Self-pay | Admitting: Physical Therapy

## 2019-08-03 DIAGNOSIS — M5442 Lumbago with sciatica, left side: Secondary | ICD-10-CM | POA: Insufficient documentation

## 2019-08-03 DIAGNOSIS — M533 Sacrococcygeal disorders, not elsewhere classified: Secondary | ICD-10-CM | POA: Insufficient documentation

## 2019-08-03 DIAGNOSIS — G8929 Other chronic pain: Secondary | ICD-10-CM | POA: Insufficient documentation

## 2019-08-03 DIAGNOSIS — R252 Cramp and spasm: Secondary | ICD-10-CM | POA: Insufficient documentation

## 2019-08-03 DIAGNOSIS — M6281 Muscle weakness (generalized): Secondary | ICD-10-CM | POA: Insufficient documentation

## 2019-08-03 NOTE — Progress Notes (Signed)
Kari Ortega - 39 y.o. female MRN [id number]  Date of birth: 02/19/1981  Office Visit Note: Visit Date: 08/02/2019 PCP: Kerin Perna, NP Referred by: Kerin Perna, NP  Subjective: Chief Complaint  Patient presents with  . Lower Back - Pain   HPI: Kari Ortega is a 39 y.o. female who comes in today For evaluation and management and potential lumbar spine intervention at the request of Dr. Legrand Como Hilts.  Spanish interpreter present today.  Patient reports intermittent severe pain in the middle part of her lower back.  She reports this started about a year ago when she had her son.  She reports physical therapy but has been directed through Dr. Junius Roads has been beneficial and she feels like she is in a lot less pain than she was before.  She also has a new physical therapy session starting at the [place] location for more pelvic floor therapy.  We did provide her with a printout of the address and location of this therapy.  She had been going to [facility].  She reports worsening with carrying heavy objects and she does get some relief with the therapy and Tylenol.  She has no red flag complaints.  She really reports that she really is not interested in any type of injection at this point do to the fact that she is feeling some better and does want to see how the next therapy session will go.  She has not had prior lumbar surgery or prior lumbar spine interventional procedure.  Review of Systems  Musculoskeletal: Positive for back pain.  All other systems reviewed and are negative.  Otherwise per HPI.  Assessment & Plan: Visit Diagnoses:  1. Radiculopathy due to lumbar intervertebral disc disorder   2. Chronic bilateral low back pain without sciatica     Plan: Findings:  Chronic midline low back pain at the lumbosacral junction which seems to be probably more discogenic type pain with bulge at this level probably with unseen annular tear.   Otherwise MRI findings are very mild and she has a really good spine overall.  She has gotten better with physical therapy and she is going to wait any other treatment with [place] depending on how things are going and I feel like that is definitely the right choice with her at this point.  We did talk about interventional spine procedures and what we do and why we do it now we do it.  She will call us if needed.  Continue with pelvic floor physical therapy.    Meds & Orders: No orders of the defined types were placed in this encounter.  No orders of the defined types were placed in this encounter.   Follow-up: Return if symptoms worsen or fail to improve.   Procedures: No procedures performed  No notes on file   Clinical History: MRI LUMBAR SPINE WITHOUT CONTRAST    TECHNIQUE:  Multiplanar, multisequence MR imaging of the lumbar spine was  performed. No intravenous contrast was administered.    COMPARISON: Prior radiograph from 03/18/2019.    FINDINGS:  Segmentation: Standard. Lowest well-formed disc space labeled the  L5-S1 level.    Alignment: Mild straightening of the normal lumbar lordosis. No  listhesis or subluxation.    Vertebrae: Vertebral body height maintained without evidence for  acute or chronic fracture. Bone marrow signal intensity within  normal limits. No discrete or worrisome osseous lesions. No abnormal  marrow edema.    Conus medullaris and cauda  equina: Conus extends to the T12-L1  level. Conus and cauda equina appear normal.    Paraspinal and other soft tissues: Visualized paraspinous soft  tissues within normal limits. Visualized visceral structures are  unremarkable.    Disc levels:    Lumbar spine is visualized from the T10-11 level inferiorly. No  significant findings are seen through the L3-4 level.    L4-5: Normal interspace. Mild bilateral facet hypertrophy. No  significant canal or foraminal stenosis. No impingement.    L5-S1:  Shallow posterior disc bulge, eccentric to the left. Bulging  disc closely approximates the descending S1 nerve roots without  frank impingement, greater on the left. No significant canal or  lateral recess stenosis. Foramina remain patent.    IMPRESSION:  1. Shallow posterior disc bulge at L5-S1, closely approximating the  descending S1 nerve roots without frank impingement, greater on the  left. Finding could potentially contribute to left lower extremity  symptoms.  2. Mild bilateral facet hypertrophy at L4-5.  3. Otherwise unremarkable MRI of the lumbar spine.      Electronically Signed  By: Jeannine Boga M.D.  On: 07/06/2019 04:21 -- MR SACRUM WITHOUT CONTRAST  TECHNIQUE: Multiplanar, multisequence MR imaging of the sacrum was performed. No intravenous contrast was administered.  COMPARISON:  Plain films of the sacrum 03/18/2019 from [employer].  FINDINGS: Bones/Joint/Cartilage  Marrow signal is normal throughout without fracture, stress change or focal lesion. The coccyx demonstrates normal signal. No fluid or edema at the intercoccygeal segments is identified. Visualized lower lumbar spine is normal. The SI joints also appear normal without effusion, degenerative disease or evidence of inflammatory arthropathy.  Ligaments  Intact and normal in appearance.  Muscles and Tendons  Intact and normal in appearance.  Soft tissues  Imaged intrapelvic contents are normal.  IMPRESSION: Normal exam.   Electronically Signed   By: Inge Rise M.D.   On: 07/06/2019 09:04   She reports that she has never smoked. She has never used smokeless tobacco. No results for input(s): HGBA1C, LABURIC in the last 8760 hours.  Objective:  VS:  HT:4' 10.27" (148 cm)   WT:165 lb (74.8 kg)  BMI:34.17    BP:115/70  HR:84bpm  TEMP: ( )  RESP:  Physical Exam Constitutional:      General: She is not in acute distress.    Appearance:  Normal appearance. She is not ill-appearing.  HENT:     Head: Normocephalic and atraumatic.     Right Ear: External ear normal.     Left Ear: External ear normal.  Eyes:     Extraocular Movements: Extraocular movements intact.  Cardiovascular:     Rate and Rhythm: Normal rate.     Pulses: Normal pulses.  Musculoskeletal:     Right lower leg: No edema.     Left lower leg: No edema.     Comments: Patient has good distal strength and ambulates without aid with normal gait.  No clonus or focal weakness.  Skin:    Findings: No erythema, lesion or rash.  Neurological:     General: No focal deficit present.     Mental Status: She is alert and oriented to person, place, and time.     Sensory: No sensory deficit.     Motor: No weakness or abnormal muscle tone.     Coordination: Coordination normal.  Psychiatric:        Mood and Affect: Mood normal.        Behavior: Behavior normal.  Ortho Exam Imaging: No results found.  Past Medical/Family/Surgical/Social History: Medications & Allergies reviewed per EMR, new medications updated. Patient Active Problem List   Diagnosis Date Noted  . Symptomatic cholelithiasis 06/13/2018  . Postpartum care and examination 04/29/2018  . Urine frequency 04/29/2018  . Normal labor 03/18/2018  . 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 BMI 30-39.9 09/21/2017  . Obesity in pregnancy 09/21/2017  . Language barrier 09/21/2017  . Supervision of high risk pregnancy, antepartum 08/24/2017  . AMA (advanced maternal age) multigravida 35+ 08/24/2017  . HYPERTRIGLYCERIDEMIA 12/15/2006   Past Medical History:  Diagnosis Date  . Anemia   . 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  Unable to determine reason per pt.    . Irregular periods    Family History  Problem Relation Age of Onset  . Diabetes Mother    Past Surgical History:  Procedure Laterality Date  . CHOLECYSTECTOMY N/A 06/14/2018   Procedure: LAPAROSCOPIC  CHOLECYSTECTOMY;  Surgeon: [name], [name], MD;  Location: [place];  Service: General;  Laterality: N/A;  . TOOTH EXTRACTION     Social History   Occupational History  . Not on file  Tobacco Use  . Smoking status: Never Smoker  . Smokeless tobacco: Never Used  Substance and Sexual Activity  . Alcohol use: No  . Drug use: No  . Sexual activity: Yes    Birth control/protection: None

## 2019-08-03 NOTE — Therapy (Signed)
[facility] [address] [place], [place], [address] Phone: [phone]   Fax:  [phone]  Physical Therapy Evaluation  Patient Details  Name: Kari Ortega MRN: [id number] Date of Birth: 1981/04/18 Referring Provider (PT): Dr. Eunice Blase   Encounter Date: 08/03/2019  PT End of Session - 08/03/19 1409    Visit Number  1    Date for PT Re-Evaluation  09/28/19    PT Start Time  [id number]   came late   PT Stop Time  [id number]    PT Time Calculation (min)  37 min    Activity Tolerance  Patient tolerated treatment well    Behavior During Therapy  [facility] for tasks assessed/performed       Past Medical History:  Diagnosis Date  . Anemia   . 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  Unable to determine reason per pt.    . Irregular periods     Past Surgical History:  Procedure Laterality Date  . CHOLECYSTECTOMY N/A 06/14/2018   Procedure: LAPAROSCOPIC CHOLECYSTECTOMY;  Surgeon: [name], [name], MD;  Location: [place];  Service: General;  Laterality: N/A;  . TOOTH EXTRACTION      There were no vitals filed for this visit.   Subjective Assessment - 08/03/19 1412    Subjective  I have alot of pain. Pain started when her son was born 03/18/2018.  The pain started after the birth. The pain started right away. No numbness or tingling in her legs. The MD suggested shots.    Patient Stated Goals  feel better    Currently in Pain?  Yes    Pain Score  8     Pain Location  Coccyx    Pain Orientation  Mid    Pain Descriptors / Indicators  Pressure    Pain Type  Chronic pain    Pain Onset  More than a month ago    Pain Frequency  Constant    Aggravating Factors   pick up or carry something    Pain Relieving Factors  Tylenol    Multiple Pain Sites  No         [facility] PT Assessment - 08/03/19 0001      Assessment   Medical Diagnosis  M53.3 Pain in the coccyx    Referring Provider (PT)   Dr. Legrand Como Hilts    Onset Date/Surgical Date  --   02/2018   Prior Therapy  yes in the past      Precautions   Precautions  None      Restrictions   Weight Bearing Restrictions  No      Balance Screen   Has the patient fallen in the past 6 months  No    Has the patient had a decrease in activity level because of a fear of falling?   No    Is the patient reluctant to leave their home because of a fear of falling?   No      Home Film/video editor residence      Prior Function   Level of Independence  Independent    Leisure  none      Cognition   Overall Cognitive Status  Within Functional Limits for tasks assessed      Posture/Postural Control   Posture/Postural Control  No significant limitations    Posture Comments  wears a SI belt, overweight, short trunk  ROM / Strength   AROM / PROM / Strength  AROM;PROM;Strength      AROM   Lumbar Flexion  full with pain and helped by pushing on thighs    Lumbar Extension  decreased by 25% with pain    Lumbar - Right Side Bend  decreased by 25% with pain    Lumbar - Left Side Bend  decreased by 25% with pain    Lumbar - Right Rotation  decreased by 25% with pain    Lumbar - Left Rotation  decreased by 25% with pain      PROM   Right Hip External Rotation   55    Left Hip External Rotation   40      Strength   Right Hip ABduction  3+/5    Right Hip ADduction  4/5    Left Hip Extension  4/5    Left Hip ABduction  4/5    Left Hip ADduction  4/5      Palpation   Palpation comment  tenderness on left psoas, left SI joint, left coccygeus, left ITB, left lateral hip, L3-L5 area      Special Tests    Special Tests  Hip Special Tests      Trendelenburg Test   Findings  Positive    Side  Left    Comments  right hip drops                Objective measurements completed on examination: See above findings.    Pelvic Floor Special Questions - 08/03/19 0001    Prior Pregnancies  Yes     Number of Pregnancies  3    Number of Vaginal Deliveries  3    Currently Sexually Active  No    Marinoff Scale  pain prevents any attempts at intercourse   due to coccyx pain   Urinary Leakage  Yes    Activities that cause leaking  Coughing;Sneezing;Laughing   rare   Fecal incontinence  No    Skin Integrity  Intact    Pelvic Floor Internal Exam  Patient confirms identification and approves PT to assess pelvic floor and treatment    Exam Type  Rectal    Palpation  tenderness located on the left levator ani and obturator internist, decreased pull of the puborectalis    Strength  weak squeeze, no lift                 PT Short Term Goals - 08/03/19 1650      PT SHORT TERM GOAL #1   Title  Pt will be independent in her HEP.    Time  4    Period  Weeks    Status  New    Target Date  08/31/19      PT SHORT TERM GOAL #2   Title  able to sit with pain decreased >/= 25% in lumbar and coccyx    Time  4    Period  Weeks    Status  New    Target Date  08/31/19      PT SHORT TERM GOAL #3   Title  understand correct body mechanics with daily tasks to reduce strain on her back    Time  4    Period  Weeks    Status  New        PT Long Term Goals - 08/03/19 1652      PT LONG TERM GOAL #1   Title  Independent with advanced HEP and how to progress herself    Time  8    Period  Weeks    Status  New    Target Date  09/28/19      PT LONG TERM GOAL #2   Title  able to sit and stand with minimal pain due to pelvis and coccyx in correct alignment    Baseline  ---    Time  8    Period  Weeks    Status  New    Target Date  09/28/19      PT LONG TERM GOAL #3   Title  Pt will be able to report minimal to no pain while sitting >/= 30 minutes.    Time  8    Period  Weeks    Status  New    Target Date  09/28/19      PT LONG TERM GOAL #4   Title  Pt will be able to amb 1/2 mile on community surfaces with minimal  pain reported.    Baseline  ---    Time  8    Period   Weeks    Status  New    Target Date  09/28/19      PT LONG TERM GOAL #5   Title  Pt will be able to bend and lift objects (10 pounds) off the floor with correct body mechanics and no pain reported.    Baseline  ---    Time  8    Period  Weeks    Status  New    Target Date  09/28/19             Plan - 08/03/19 1447    Clinical Impression Statement  Patient is a 39 year old female with coccyx and low back pain at level 8/10 since she gave birth to her son 02/2018. Patient has had therapy in the past and it has helped. Patient wears a brace and says it helps her.Patient has decreased lumbar ROM. Anal sphincter strength was 2/5 with tenderness located on the left levator ani and obturator internist. Decreased mobility of the coccyx bone. Patient has tenderness located on the left psoas and lumbar paraspinals, around the left hip, left ITB, left gluteals. Positive Trendelenberg on the left. Decreased P/ROM of the hip rotators. Left ilium rotated posteriorly. Patient has decreased pull of the puborectalis. Patient will benefi tfrom skilled therapy to reduce her pain, imporve mobility, improve strength and reduce muscle spasms to improve daily function.    Personal Factors and Comorbidities  Age;Fitness    Examination-Activity Limitations  Lift;Bend;Sit;Locomotion Level    Examination-Participation Restrictions  Community Activity;Driving;Interpersonal Relationship;Meal Prep;Cleaning    Stability/Clinical Decision Making  Stable/Uncomplicated    Clinical Decision Making  Low    Rehab Potential  Good    PT Frequency  2x / week    PT Duration  8 weeks    PT Treatment/Interventions  Biofeedback;Cryotherapy;Electrical Stimulation;Therapeutic activities;Therapeutic exercise;Neuromuscular re-education;Manual techniques;Patient/family education;Dry needling;Spinal Manipulations;Joint Manipulations    PT Next Visit Plan  back extension, dry needling to back, correct pelvis, hip stretches, mobilize  for hip ER bil.    Consulted and Agree with Plan of Care  Patient       Patient will benefit from skilled therapeutic intervention in order to improve the following deficits and impairments:  Decreased coordination, Decreased range of motion, Increased fascial restricitons, Pain, Decreased activity tolerance, Decreased strength, Decreased mobility, Increased muscle spasms  Visit Diagnosis: Coccyx  pain  Cramp and spasm  Muscle weakness (generalized)  Chronic bilateral low back pain with left-sided sciatica     Problem List Patient Active Problem List   Diagnosis Date Noted  . Symptomatic cholelithiasis 06/13/2018  . Postpartum care and examination 04/29/2018  . Urine frequency 04/29/2018  . Normal labor 03/18/2018  . 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 BMI 30-39.9 09/21/2017  . Obesity in pregnancy 09/21/2017  . Language barrier 09/21/2017  . Supervision of high risk pregnancy, antepartum 08/24/2017  . AMA (advanced maternal age) multigravida 35+ 08/24/2017  . HYPERTRIGLYCERIDEMIA 12/15/2006    Earlie Counts, PT 08/03/19 5:11 PM   [facility] [address] [place], [place], [address] Phone: [phone]   Fax:  [phone]  Name: Kari Ortega MRN: [id number] Date of Birth: 10-01-1980

## 2019-08-09 ENCOUNTER — Ambulatory Visit: Payer: Self-pay | Admitting: Physical Therapy

## 2019-08-17 ENCOUNTER — Other Ambulatory Visit: Payer: Self-pay

## 2019-08-17 ENCOUNTER — Ambulatory Visit: Payer: Self-pay | Attending: Family Medicine

## 2019-08-22 ENCOUNTER — Other Ambulatory Visit: Payer: Self-pay

## 2019-08-22 ENCOUNTER — Encounter: Payer: Self-pay | Admitting: Physical Therapy

## 2019-08-22 ENCOUNTER — Ambulatory Visit: Payer: Self-pay | Attending: Family Medicine | Admitting: Physical Therapy

## 2019-08-22 DIAGNOSIS — M6281 Muscle weakness (generalized): Secondary | ICD-10-CM | POA: Insufficient documentation

## 2019-08-22 DIAGNOSIS — G8929 Other chronic pain: Secondary | ICD-10-CM | POA: Insufficient documentation

## 2019-08-22 DIAGNOSIS — M5442 Lumbago with sciatica, left side: Secondary | ICD-10-CM | POA: Insufficient documentation

## 2019-08-22 DIAGNOSIS — R252 Cramp and spasm: Secondary | ICD-10-CM | POA: Insufficient documentation

## 2019-08-22 DIAGNOSIS — M533 Sacrococcygeal disorders, not elsewhere classified: Secondary | ICD-10-CM | POA: Insufficient documentation

## 2019-08-22 NOTE — Patient Instructions (Signed)
Access Code: [email or web address] URL: [email or web address] Date: 08/22/2019 Prepared by: Earlie Counts  Exercises Supine Butterfly Groin Stretch - 1 x daily - 7 x weekly - 1 sets - 1 reps - 30 sec hold Supine Hamstring Stretch - 1 x daily - 7 x weekly - 1 sets - 2 reps - 30 sec hold Supine Figure 4 Piriformis Stretch - 1 x daily - 7 x weekly - 1 sets - 2 reps - 30 sec hold Prone Press Up - 1 x daily - 7 x weekly - 1 sets - 5 reps - 1 sec hold Child's Pose Stretch - 1 x daily - 7 x weekly - 1 sets - 1 reps - 30 sec hold [facility] [address] [place],  [address] Phone # [phone] Fax [phone]

## 2019-08-22 NOTE — Therapy (Addendum)
[facility] [address], [place], [place], [address] Phone: [phone]   Fax:  [phone]  Physical Therapy Treatment  Patient Details  Name: Kari Ortega MRN: [id number] Date of Birth: July 16, 1980 Referring Provider (PT): Dr. Eunice Blase   Encounter Date: 08/22/2019  PT End of Session - 08/22/19 1611    Visit Number  2    Date for PT Re-Evaluation  09/28/19    PT Start Time  6503    PT Stop Time  1615    PT Time Calculation (min)  45 min    Activity Tolerance  Patient tolerated treatment well    Behavior During Therapy  [facility] for tasks assessed/performed       Past Medical History:  Diagnosis Date  . Anemia   . 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  Unable to determine reason per pt.    . Irregular periods     Past Surgical History:  Procedure Laterality Date  . CHOLECYSTECTOMY N/A 06/14/2018   Procedure: LAPAROSCOPIC CHOLECYSTECTOMY;  Surgeon: [name], [name], MD;  Location: [place];  Service: General;  Laterality: N/A;  . TOOTH EXTRACTION      There were no vitals filed for this visit.  Subjective Assessment - 08/22/19 1539    Subjective  The coccyx area I got a rash and was itchy.    Patient Stated Goals  feel better    Currently in Pain?  Yes    Pain Score  4     Pain Location  Coccyx    Pain Orientation  Mid    Pain Descriptors / Indicators  Pressure    Pain Type  Chronic pain    Pain Onset  More than a month ago    Pain Frequency  Constant    Aggravating Factors   Pick up or carry something    Pain Relieving Factors  Tylenol    Multiple Pain Sites  No         [facility] PT Assessment - 08/22/19 0001      Palpation   SI assessment   left ilium posteriorly rotated                   [facility] Adult PT Treatment/Exercise - 08/22/19 0001      Lumbar Exercises: Stretches   Active Hamstring Stretch  Right;Left;1 rep;30 seconds    Active  Hamstring Stretch Limitations  supine    Press Ups  5 reps    Quadruped Mid Back Stretch  1 rep;30 seconds    Quadruped Mid Back Stretch Limitations  childs pose    Piriformis Stretch  Right;Left;1 rep;30 seconds    Piriformis Stretch Limitations  supine    Other Lumbar Stretch Exercise  butterfly stretch      Manual Therapy   Manual Therapy  Soft tissue mobilization;Muscle Energy Technique    Soft tissue mobilization  left iliopsoas, bilateral levator ani piriformis, coccygeus, in sidely    Muscle Energy Technique  correct the left ilium             PT Education - 08/22/19 1608    Education Details  Access Code: [email or web address]    Person(s) Educated  Patient    Methods  Explanation;Demonstration;Verbal cues;Handout    Comprehension  Returned demonstration;Verbalized understanding       PT Short Term Goals - 08/03/19 1650      PT SHORT TERM GOAL #1   Title  Pt  will be independent in her HEP.    Time  4    Period  Weeks    Status  New    Target Date  08/31/19      PT SHORT TERM GOAL #2   Title  able to sit with pain decreased >/= 25% in lumbar and coccyx    Time  4    Period  Weeks    Status  New    Target Date  08/31/19      PT SHORT TERM GOAL #3   Title  understand correct body mechanics with daily tasks to reduce strain on her back    Time  4    Period  Weeks    Status  New        PT Long Term Goals - 08/03/19 1652      PT LONG TERM GOAL #1   Title  Independent with advanced HEP and how to progress herself    Time  8    Period  Weeks    Status  New    Target Date  09/28/19      PT LONG TERM GOAL #2   Title  able to sit and stand with minimal pain due to pelvis and coccyx in correct alignment    Baseline  ---    Time  8    Period  Weeks    Status  New    Target Date  09/28/19      PT LONG TERM GOAL #3   Title  Pt will be able to report minimal to no pain while sitting >/= 30 minutes.    Time  8    Period  Weeks    Status  New    Target Date   09/28/19      PT LONG TERM GOAL #4   Title  Pt will be able to amb 1/2 mile on community surfaces with minimal  pain reported.    Baseline  ---    Time  8    Period  Weeks    Status  New    Target Date  09/28/19      PT LONG TERM GOAL #5   Title  Pt will be able to bend and lift objects (10 pounds) off the floor with correct body mechanics and no pain reported.    Baseline  ---    Time  8    Period  Weeks    Status  New    Target Date  09/28/19            Plan - 08/22/19 1612    Clinical Impression Statement  Patient felt some relief with the coccyx during treatment but when she sat upright the pain returned. Patient has tightness in the coccygeus and left levator ani. Patient pelvis in correct alignment after manual work. Patient has stretches to do and was put into spanish for her. Patient had some irritation of the anal area  after the assessment. Patient will benefit from skilled therapy to reduce her pain, improve mobility, improve strength and reduce muscle spasm to improve daily function.    Personal Factors and Comorbidities  Age;Fitness    Examination-Activity Limitations  Lift;Bend;Sit;Locomotion Level    Examination-Participation Restrictions  Community Activity;Driving;Interpersonal Relationship;Meal Prep;Cleaning    Stability/Clinical Decision Making  Stable/Uncomplicated    Rehab Potential  Good    PT Frequency  2x / week    PT Duration  8 weeks    PT Treatment/Interventions  Biofeedback;Cryotherapy;Electrical Stimulation;Therapeutic activities;Therapeutic exercise;Neuromuscular re-education;Manual techniques;Patient/family education;Dry needling;Spinal Manipulations;Joint Manipulations    PT Next Visit Plan  work on coccygeus, squat to release the trigger points, check pelvic alignment, happy baby    PT Home Exercise Plan  Access Code: [email or web address]    Consulted and Agree with Plan of Care  Patient       Patient will benefit from skilled therapeutic intervention in  order to improve the following deficits and impairments:  Decreased coordination, Decreased range of motion, Increased fascial restricitons, Pain, Decreased activity tolerance, Decreased strength, Decreased mobility, Increased muscle spasms  Visit Diagnosis: Cramp and spasm  Muscle weakness (generalized)  Coccyx pain  Chronic bilateral low back pain with left-sided sciatica     Problem List Patient Active Problem List   Diagnosis Date Noted  . Symptomatic cholelithiasis 06/13/2018  . Postpartum care and examination 04/29/2018  . Urine frequency 04/29/2018  . Normal labor 03/18/2018  . 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 BMI 30-39.9 09/21/2017  . Obesity in pregnancy 09/21/2017  . Language barrier 09/21/2017  . Supervision of high risk pregnancy, antepartum 08/24/2017  . AMA (advanced maternal age) multigravida 35+ 08/24/2017  . HYPERTRIGLYCERIDEMIA 12/15/2006    Earlie Counts, PT 08/22/19 5:16 PM   [facility] [address] [place], [place], [address] Phone: [phone]   Fax:  [phone]  Name: Suzzane Quilter MRN: [id number] Date of Birth: 1980/09/05  PHYSICAL THERAPY DISCHARGE SUMMARY  Visits from Start of Care: 2  Current functional level related to goals / functional outcomes: See above.    Remaining deficits: See above. Patient feels she is getting more out of PT to her lumbar spine and wants to stop the PT to coccyx.    Education / Equipment: HEP Plan: Patient agrees to discharge.  Patient goals were not met. Patient is being discharged due to the patient's request.  Thank you for the referral. Earlie Counts, PT 08/30/19 5:25 PM  ?????

## 2019-08-25 ENCOUNTER — Ambulatory Visit: Payer: Self-pay | Admitting: Family Medicine

## 2019-08-26 ENCOUNTER — Ambulatory Visit: Payer: Self-pay

## 2019-08-29 ENCOUNTER — Encounter: Payer: Self-pay | Admitting: Physical Therapy

## 2019-08-30 ENCOUNTER — Encounter: Payer: Self-pay | Admitting: Family Medicine

## 2019-08-30 ENCOUNTER — Other Ambulatory Visit: Payer: Self-pay

## 2019-08-30 ENCOUNTER — Ambulatory Visit (INDEPENDENT_AMBULATORY_CARE_PROVIDER_SITE_OTHER): Payer: Self-pay | Admitting: Family Medicine

## 2019-08-30 DIAGNOSIS — M533 Sacrococcygeal disorders, not elsewhere classified: Secondary | ICD-10-CM

## 2019-08-30 DIAGNOSIS — M5442 Lumbago with sciatica, left side: Secondary | ICD-10-CM

## 2019-08-30 NOTE — Progress Notes (Signed)
   Office Visit Note   Patient: Kari Ortega           Date of Birth: May 18, 1980           MRN: [id number] Visit Date: 08/30/2019 Requested by: Antony Blackbird, MD [place],  [place] [address] PCP: Antony Blackbird, MD  Subjective: Chief Complaint  Patient presents with  . Lower Back - Pain    HPI: She is here for follow-up chronic low back pain and tailbone pain.  Since last visit she met with Dr. Ernestina Patches, she was not interested in injections at that point.  She was referred to pelvic floor PT.  She feels like it is not helping as much as the lumbar directed therapy, and she would like to go back back to PT at [facility] location again.  Still having tailbone pain, still having occasional pain radiating into the legs.  She is not taking medication for her pain.  Interpreter was present today.              ROS: No bowel or bladder dysfunction.  All other systems were reviewed and are negative.  Objective: Vital Signs: There were no vitals taken for this visit.  Physical Exam:  General:  Alert and oriented, in no acute distress. Pulm:  Breathing unlabored. Psy:  Normal mood, congruent affect.  Low back: She remains under over the sacrum/coccyx.  No lumbar tenderness today.  No pain in sciatic notch, negative straight leg raise, lower extremity strength and reflexes are normal.  Imaging: No results found.  Assessment & Plan: 1.  Chronic low back and tailbone pain -Per patient request we will resume PT at [facility] location. -If she fails to improve, could contemplate return to the afternoon for consideration of injections. -I will see her back as needed.     Procedures: No procedures performed  No notes on file     PMFS History: Patient Active Problem List   Diagnosis Date Noted  . Symptomatic cholelithiasis 06/13/2018  . Postpartum care and examination 04/29/2018  . Urine frequency 04/29/2018  . Normal labor 03/18/2018  . 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 BMI 30-39.9 09/21/2017  . Obesity in pregnancy 09/21/2017  . Language barrier 09/21/2017  . Supervision of high risk pregnancy, antepartum 08/24/2017  . AMA (advanced maternal age) multigravida 35+ 08/24/2017  . HYPERTRIGLYCERIDEMIA 12/15/2006   Past Medical History:  Diagnosis Date  . Anemia   . 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  Unable to determine reason per pt.    . Irregular periods     Family History  Problem Relation Age of Onset  . Diabetes Mother     Past Surgical History:  Procedure Laterality Date  . CHOLECYSTECTOMY N/A 06/14/2018   Procedure: LAPAROSCOPIC CHOLECYSTECTOMY;  Surgeon: [name], [name], MD;  Location: [place];  Service: General;  Laterality: N/A;  . TOOTH EXTRACTION     Social History   Occupational History  . Not on file  Tobacco Use  . Smoking status: Never Smoker  . Smokeless tobacco: Never Used  Substance and Sexual Activity  . Alcohol use: No  . Drug use: No  . Sexual activity: Yes    Birth control/protection: None

## 2019-08-31 ENCOUNTER — Encounter: Payer: Self-pay | Admitting: Physical Therapy

## 2019-09-01 ENCOUNTER — Encounter: Payer: Self-pay | Admitting: Physical Therapy

## 2019-09-01 ENCOUNTER — Ambulatory Visit: Payer: Self-pay | Admitting: Physical Therapy

## 2019-09-01 ENCOUNTER — Other Ambulatory Visit: Payer: Self-pay

## 2019-09-01 DIAGNOSIS — M533 Sacrococcygeal disorders, not elsewhere classified: Secondary | ICD-10-CM

## 2019-09-01 DIAGNOSIS — M6281 Muscle weakness (generalized): Secondary | ICD-10-CM

## 2019-09-01 DIAGNOSIS — R252 Cramp and spasm: Secondary | ICD-10-CM

## 2019-09-01 DIAGNOSIS — M5442 Lumbago with sciatica, left side: Secondary | ICD-10-CM

## 2019-09-01 DIAGNOSIS — G8929 Other chronic pain: Secondary | ICD-10-CM

## 2019-09-01 NOTE — Therapy (Addendum)
[place], [place], [address] Phone: [phone]   Fax:  [phone]  Physical Therapy Treatment  Patient Details  Name: Kari Ortega MRN: [id number] Date of Birth: Jul 11, 1980 Referring Provider (PT): Dr. Eunice Blase   Encounter Date: 09/01/2019  PT End of Session - 09/01/19 1758    Visit Number  3    Date for PT Re-Evaluation  09/28/19    PT Start Time  1720   Pt. arrived late   PT Stop Time  1758    PT Time Calculation (min)  38 min    Activity Tolerance  Patient tolerated treatment well    Behavior During Therapy  [facility] for tasks assessed/performed       Past Medical History:  Diagnosis Date  . Anemia   . 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  Unable to determine reason per pt.    . Irregular periods     Past Surgical History:  Procedure Laterality Date  . CHOLECYSTECTOMY N/A 06/14/2018   Procedure: LAPAROSCOPIC CHOLECYSTECTOMY;  Surgeon: [name], [name], MD;  Location: [place];  Service: General;  Laterality: N/A;  . TOOTH EXTRACTION      There were no vitals filed for this visit.  Subjective Assessment - 09/01/19 1723    Subjective  "The thing is they had to send me back to PT, I didn't feel like I was getting any benefit at the pelvic floor place, but I was here. The pain can get very bad. Massages help. They found out  that one of the disc in my spine was damaged." Patient is still having problems with bending over and picking up son, but there are days that she doesn't have much pain.    Limitations  Lifting;House hold activities    Patient Stated Goals  feel better    Currently in Pain?  Yes    Pain Score  3     Pain Location  Coccyx    Pain Orientation  Mid    Pain Descriptors / Indicators  Pressure    Pain Type  Chronic pain    Pain Onset  More than a month ago    Pain Frequency  Intermittent    Aggravating Factors   Pick up or carry  objects    Pain Relieving Factors  Tylenol         [facility] PT Assessment - 09/02/19 0001      Assessment   Medical Diagnosis  Pain in the coccyx    Referring Provider (PT)  Dr. Legrand Como [place]    Hand Dominance  Right    Prior Therapy  Yes                            PT Education - 09/02/19 [phone]    Education Details  Patient educated on x-ray findings, working hypothesis, and rehab expectations (anatomy of condition and POC )   Extra time spent educating due to interpreter   Person(s) Educated  Patient    Methods  Explanation    Comprehension  Verbalized understanding       PT Short Term Goals - 09/01/19 1748      PT SHORT TERM GOAL #1   Title  Pt will be independent in her HEP.    Baseline  She reports that she does some    Time  4    Period  Weeks  Status  On-going    Target Date  08/31/19      PT SHORT TERM GOAL #2   Title  able to sit with pain decreased >/= 25% in lumbar and coccyx    Baseline  Patient reports that her back does still hurt and often has to readjust    Time  4    Period  Weeks    Status  Achieved    Target Date  08/31/19      PT SHORT TERM GOAL #3   Title  understand correct body mechanics with daily tasks to reduce strain on her back    Time  4    Period  Weeks    Status  On-going        PT Long Term Goals - 08/03/19 1652      PT LONG TERM GOAL #1   Title  Independent with advanced HEP and how to progress herself    Time  8    Period  Weeks    Status  New    Target Date  09/28/19      PT LONG TERM GOAL #2   Title  able to sit and stand with minimal pain due to pelvis and coccyx in correct alignment    Baseline  ---    Time  8    Period  Weeks    Status  New    Target Date  09/28/19      PT LONG TERM GOAL #3   Title  Pt will be able to report minimal to no pain while sitting >/= 30 minutes.    Time  8    Period  Weeks    Status  New    Target Date  09/28/19      PT LONG TERM GOAL #4   Title  Pt will be  able to amb 1/2 mile on community surfaces with minimal  pain reported.    Baseline  ---    Time  8    Period  Weeks    Status  New    Target Date  09/28/19      PT LONG TERM GOAL #5   Title  Pt will be able to bend and lift objects (10 pounds) off the floor with correct body mechanics and no pain reported.    Baseline  ---    Time  8    Period  Weeks    Status  New    Target Date  09/28/19            Plan - 09/01/19 1759    Clinical Impression Statement  Patient reports to PT with continued coccyx pain. She continues to show deficits in hip ROM and strength. Her left side is more painful than the right. She also has an antalgic get with decreased arm swing on the left. She believe that therapy has been beneficial, but she is still having trouble with bending and lifting. Patient's symptoms are consistent with a sacrotuberous injury. Patient indicated that she got a lot of relief from manual therapy. She reports that she is not consistent w/ HEP and still uses bad body mechanics. She would benefit from PT to further address pain and strength deficits.    Personal Factors and Comorbidities  Age;Fitness    Examination-Activity Limitations  Lift;Bend;Sit;Locomotion Level    Examination-Participation Restrictions  Community Activity;Driving;Interpersonal Relationship;Meal Prep;Cleaning    Stability/Clinical Decision Making  Evolving/Moderate complexity    Clinical Decision Making  Moderate  Rehab Potential  Good    PT Frequency  2x / week    PT Duration  8 weeks    PT Treatment/Interventions  Biofeedback;Cryotherapy;Electrical Stimulation;Therapeutic activities;Therapeutic exercise;Neuromuscular re-education;Manual techniques;Patient/family education;Dry needling;Spinal Manipulations;Joint Manipulations    PT Next Visit Plan  Manual Therapy, Hip strengthening    PT Home Exercise Plan  Access Code: [email or web address]    Consulted and Agree with Plan of Care  Patient       Patient will  benefit from skilled therapeutic intervention in order to improve the following deficits and impairments:  Decreased coordination, Decreased range of motion, Increased fascial restricitons, Pain, Decreased activity tolerance, Decreased strength, Decreased mobility, Increased muscle spasms  Visit Diagnosis: Cramp and spasm  Coccyx pain  Muscle weakness (generalized)  Chronic bilateral low back pain with left-sided sciatica     Problem List Patient Active Problem List   Diagnosis Date Noted  . Symptomatic cholelithiasis 06/13/2018  . Postpartum care and examination 04/29/2018  . Urine frequency 04/29/2018  . Normal labor 03/18/2018  . 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 BMI 30-39.9 09/21/2017  . Obesity in pregnancy 09/21/2017  . Language barrier 09/21/2017  . Supervision of high risk pregnancy, antepartum 08/24/2017  . AMA (advanced maternal age) multigravida 35+ 08/24/2017  . HYPERTRIGLYCERIDEMIA 12/15/2006    Laveda Norman, SPT 09/02/2019, 9:32 AM  [facility] [address] [place], [place], [address] Phone: [phone]   Fax:  [phone]  Name: Kari Ortega MRN: [id number] Date of Birth: [birth year]

## 2019-09-05 ENCOUNTER — Ambulatory Visit: Payer: Self-pay | Admitting: Physical Therapy

## 2019-09-05 ENCOUNTER — Other Ambulatory Visit: Payer: Self-pay

## 2019-09-05 ENCOUNTER — Encounter: Payer: Self-pay | Admitting: Physical Therapy

## 2019-09-05 DIAGNOSIS — R252 Cramp and spasm: Secondary | ICD-10-CM

## 2019-09-05 DIAGNOSIS — M6281 Muscle weakness (generalized): Secondary | ICD-10-CM

## 2019-09-05 DIAGNOSIS — M533 Sacrococcygeal disorders, not elsewhere classified: Secondary | ICD-10-CM

## 2019-09-05 NOTE — Therapy (Signed)
[place], [place], [address] Phone: [phone]   Fax:  [phone]  Physical Therapy Treatment  Patient Details  Name: Kari Ortega MRN: [id number] Date of Birth: [birth year] Referring Provider (PT): Dr. Eunice Blase   Encounter Date: 09/05/2019  PT End of Session - 09/05/19 1552    Visit Number  4    Date for PT Re-Evaluation  09/28/19    PT Start Time  1551   pt arrived late   PT Stop Time  1627    PT Time Calculation (min)  36 min    Activity Tolerance  Patient tolerated treatment well    Behavior During Therapy  [facility] for tasks assessed/performed       Past Medical History:  Diagnosis Date  . Anemia   . 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  Unable to determine reason per pt.    . Irregular periods     Past Surgical History:  Procedure Laterality Date  . CHOLECYSTECTOMY N/A 06/14/2018   Procedure: LAPAROSCOPIC CHOLECYSTECTOMY;  Surgeon: [name], [name], MD;  Location: [place];  Service: General;  Laterality: N/A;  . TOOTH EXTRACTION      There were no vitals filed for this visit.  Subjective Assessment - 09/05/19 1553    Subjective  today I just have a little pain.    Currently in Pain?  Yes    Pain Score  4     Pain Location  Coccyx    Pain Descriptors / Indicators  Sharp    Pain Type  Chronic pain                       [facility] Adult PT Treatment/Exercise - 09/05/19 0001      Exercises   Exercises  Knee/Hip      Knee/Hip Exercises: Stretches   Passive Hamstring Stretch Limitations  supine by PT    Piriformis Stretch Limitations  figure 4      Knee/Hip Exercises: Supine   Bridges with Clamshell  Strengthening;15 reps;Both   green tband 5s holds   Other Supine Knee/Hip Exercises  TT holds with green tband in iso horiz abd      Knee/Hip Exercises: Prone   Other Prone Exercises  qped hip ext-toe taps      Manual  Therapy   Manual therapy comments  skilled palpation and monitoring during TPDN    Soft tissue mobilization  Lt gluts, piriformis       Trigger Point Dry Needling - 09/05/19 0001    Consent Given?  Yes    Education Handout Provided  Previously provided    Muscles Treated Back/Hip  Gluteus maximus    Other Dry Needling  sacral paraspinals    Gluteus Maximus Response  Twitch response elicited;Palpable increased muscle length   LEft            PT Short Term Goals - 09/01/19 1748      PT SHORT TERM GOAL #1   Title  Pt will be independent in her HEP.    Baseline  She reports that she does some    Time  4    Period  Weeks    Status  On-going    Target Date  08/31/19      PT SHORT TERM GOAL #2   Title  able to sit with pain decreased >/= 25% in lumbar and coccyx    Baseline  Patient reports that her back does still hurt and often has to readjust    Time  4    Period  Weeks    Status  Achieved    Target Date  08/31/19      PT SHORT TERM GOAL #3   Title  understand correct body mechanics with daily tasks to reduce strain on her back    Time  4    Period  Weeks    Status  On-going        PT Long Term Goals - 08/03/19 1652      PT LONG TERM GOAL #1   Title  Independent with advanced HEP and how to progress herself    Time  8    Period  Weeks    Status  New    Target Date  09/28/19      PT LONG TERM GOAL #2   Title  able to sit and stand with minimal pain due to pelvis and coccyx in correct alignment    Baseline  ---    Time  8    Period  Weeks    Status  New    Target Date  09/28/19      PT LONG TERM GOAL #3   Title  Pt will be able to report minimal to no pain while sitting >/= 30 minutes.    Time  8    Period  Weeks    Status  New    Target Date  09/28/19      PT LONG TERM GOAL #4   Title  Pt will be able to amb 1/2 mile on community surfaces with minimal  pain reported.    Baseline  ---    Time  8    Period  Weeks    Status  New    Target Date   09/28/19      PT LONG TERM GOAL #5   Title  Pt will be able to bend and lift objects (10 pounds) off the floor with correct body mechanics and no pain reported.    Baseline  ---    Time  8    Period  Weeks    Status  New    Target Date  09/28/19            Plan - 09/05/19 1628    Clinical Impression Statement  Pt responded well to TPDN today and reported the "ball" in her tailbone is gone. Encouraged her to perform her exercises in order to maintain lower levels of pain.    PT Treatment/Interventions  Biofeedback;Cryotherapy;Electrical Stimulation;Therapeutic activities;Therapeutic exercise;Neuromuscular re-education;Manual techniques;Patient/family education;Dry needling;Spinal Manipulations;Joint Manipulations    PT Next Visit Plan  outcome of DN? continue to progress core strength    PT Home Exercise Plan  Access Code: [email or web address]    Consulted and Agree with Plan of Care  Patient       Patient will benefit from skilled therapeutic intervention in order to improve the following deficits and impairments:  Decreased coordination, Decreased range of motion, Increased fascial restricitons, Pain, Decreased activity tolerance, Decreased strength, Decreased mobility, Increased muscle spasms  Visit Diagnosis: Cramp and spasm  Coccyx pain  Muscle weakness (generalized)     Problem List Patient Active Problem List   Diagnosis Date Noted  . Symptomatic cholelithiasis 06/13/2018  . Postpartum care and examination 04/29/2018  . Urine frequency 04/29/2018  . Normal labor 03/18/2018  . 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 BMI 30-39.9 09/21/2017  .  Obesity in pregnancy 09/21/2017  . Language barrier 09/21/2017  . Supervision of high risk pregnancy, antepartum 08/24/2017  . AMA (advanced maternal age) multigravida 35+ 08/24/2017  . HYPERTRIGLYCERIDEMIA 12/15/2006    Kari Ortega C. Cathie Bonnell PT, DPT 09/05/19 4:32 PM   [facility] [address] [place], [place], [address] Phone: [phone]   Fax:  [phone]  Name: Kari Ortega MRN: [id number] Date of Birth: 05/06/1981

## 2019-09-07 ENCOUNTER — Encounter: Payer: Self-pay | Admitting: Physical Therapy

## 2019-09-08 ENCOUNTER — Ambulatory Visit: Payer: Self-pay | Admitting: Physical Therapy

## 2019-09-08 ENCOUNTER — Encounter: Payer: Self-pay | Admitting: Physical Therapy

## 2019-09-08 ENCOUNTER — Other Ambulatory Visit: Payer: Self-pay

## 2019-09-08 DIAGNOSIS — M6281 Muscle weakness (generalized): Secondary | ICD-10-CM

## 2019-09-08 DIAGNOSIS — M533 Sacrococcygeal disorders, not elsewhere classified: Secondary | ICD-10-CM

## 2019-09-08 DIAGNOSIS — G8929 Other chronic pain: Secondary | ICD-10-CM

## 2019-09-08 DIAGNOSIS — R252 Cramp and spasm: Secondary | ICD-10-CM

## 2019-09-08 NOTE — Therapy (Signed)
[place], [place], [address] Phone: [phone]   Fax:  [phone]  Physical Therapy Treatment  Patient Details  Name: Kari Ortega MRN: [id number] Date of Birth: [birth year] Referring Provider (PT): Dr. Eunice Blase   Encounter Date: 09/08/2019  PT End of Session - 09/08/19 1726    Visit Number  5    Date for PT Re-Evaluation  09/28/19    PT Start Time  1724   pt arrived late   PT Stop Time  1759    PT Time Calculation (min)  35 min    Activity Tolerance  Patient tolerated treatment well    Behavior During Therapy  [facility] for tasks assessed/performed       Past Medical History:  Diagnosis Date  . Anemia   . 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  Unable to determine reason per pt.    . Irregular periods     Past Surgical History:  Procedure Laterality Date  . CHOLECYSTECTOMY N/A 06/14/2018   Procedure: LAPAROSCOPIC CHOLECYSTECTOMY;  Surgeon: [name], [name], MD;  Location: [place];  Service: General;  Laterality: N/A;  . TOOTH EXTRACTION      There were no vitals filed for this visit.  Subjective Assessment - 09/08/19 1727    Subjective  I have a little pain today. When I cane 2-3 days ago I ended up with a lot of pain, my body was sore. Different, this one left me a little sore.    Patient Stated Goals  feel better                       [facility] Adult PT Treatment/Exercise - 09/08/19 0001      Knee/Hip Exercises: Stretches   Active Hamstring Stretch Limitations  supine with strap- midline & lateral bias    Piriformis Stretch Limitations  hooklying pull across    Other Knee/Hip Stretches  cat/camel/child pose      Knee/Hip Exercises: Supine   Other Supine Knee/Hip Exercises  V-ups with ball switch      Knee/Hip Exercises: Sidelying   Clams  x20 each yellow tband      Knee/Hip Exercises: Prone   Other Prone Exercises  qped hip  extension yelllow band on thighs x20 each      Manual Therapy   Soft tissue mobilization  Rt piriformis, glut max, med    Rt tighter than Lt today with concordant pain            PT Education - 09/08/19 1800    Education Details  "ball" in her muscle and importance of strength, exercise form/rationale    Person(s) Educated  Patient    Methods  Explanation;Demonstration;Tactile cues;Verbal cues;Handout    Comprehension  Verbalized understanding;Returned demonstration;Verbal cues required;Tactile cues required;Need further instruction       PT Short Term Goals - 09/01/19 1748      PT SHORT TERM GOAL #1   Title  Pt will be independent in her HEP.    Baseline  She reports that she does some    Time  4    Period  Weeks    Status  On-going    Target Date  08/31/19      PT SHORT TERM GOAL #2   Title  able to sit with pain decreased >/= 25% in lumbar and coccyx    Baseline  Patient reports that her back does still hurt  and often has to readjust    Time  4    Period  Weeks    Status  Achieved    Target Date  08/31/19      PT SHORT TERM GOAL #3   Title  understand correct body mechanics with daily tasks to reduce strain on her back    Time  4    Period  Weeks    Status  On-going        PT Long Term Goals - 08/03/19 1652      PT LONG TERM GOAL #1   Title  Independent with advanced HEP and how to progress herself    Time  8    Period  Weeks    Status  New    Target Date  09/28/19      PT LONG TERM GOAL #2   Title  able to sit and stand with minimal pain due to pelvis and coccyx in correct alignment    Baseline  ---    Time  8    Period  Weeks    Status  New    Target Date  09/28/19      PT LONG TERM GOAL #3   Title  Pt will be able to report minimal to no pain while sitting >/= 30 minutes.    Time  8    Period  Weeks    Status  New    Target Date  09/28/19      PT LONG TERM GOAL #4   Title  Pt will be able to amb 1/2 mile on community surfaces with  minimal  pain reported.    Baseline  ---    Time  8    Period  Weeks    Status  New    Target Date  09/28/19      PT LONG TERM GOAL #5   Title  Pt will be able to bend and lift objects (10 pounds) off the floor with correct body mechanics and no pain reported.    Baseline  ---    Time  8    Period  Weeks    Status  New    Target Date  09/28/19            Plan - 09/08/19 1802    Clinical Impression Statement  continued tension but more noted on theRt side today. Educated that in order to decrease tension other muscles have to gain strength. Pt verbalized understanding. Increased core strengthening challenges to stabilize lumbopelvic region.    PT Treatment/Interventions  Biofeedback;Cryotherapy;Electrical Stimulation;Therapeutic activities;Therapeutic exercise;Neuromuscular re-education;Manual techniques;Patient/family education;Dry needling;Spinal Manipulations;Joint Manipulations    PT Next Visit Plan  review core HEP, lumbopelvic strength    PT Home Exercise Plan  Access Code: [email or web address]    Consulted and Agree with Plan of Care  Patient       Patient will benefit from skilled therapeutic intervention in order to improve the following deficits and impairments:  Decreased coordination, Decreased range of motion, Increased fascial restricitons, Pain, Decreased activity tolerance, Decreased strength, Decreased mobility, Increased muscle spasms  Visit Diagnosis: Cramp and spasm  Coccyx pain  Muscle weakness (generalized)  Chronic bilateral low back pain with left-sided sciatica     Problem List Patient Active Problem List   Diagnosis Date Noted  . Symptomatic cholelithiasis 06/13/2018  . Postpartum care and examination 04/29/2018  . Urine frequency 04/29/2018  . Normal labor 03/18/2018  . 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 BMI  30-39.9 09/21/2017  . Obesity in pregnancy 09/21/2017  . Language barrier 09/21/2017  . Supervision of high risk pregnancy, antepartum  08/24/2017  . AMA (advanced maternal age) multigravida 35+ 08/24/2017  . HYPERTRIGLYCERIDEMIA 12/15/2006   Kari Ortega C. Thia Olesen PT, DPT 09/08/19 6:05 PM   [facility] [address] [place], [place], [address] Phone: [phone]   Fax:  [phone]  Name: Kari Ortega MRN: [id number] Date of Birth: 12-28-1980

## 2019-09-11 NOTE — Progress Notes (Signed)
Virtual Visit via Telephone Note  I connected with Kari Ortega, on 09/12/2019 at 2:52 PM by telephone due to the COVID-19 pandemic and verified that I am speaking with the correct person using two identifiers.  Due to current restrictions/limitations of in-office visits due to the COVID-19 pandemic, this scheduled clinical appointment was converted to a telehealth visit.   Consent: I discussed the limitations, risks, security and privacy concerns of performing an evaluation and management service by telephone and the availability of in person appointments. I also discussed with the patient that there may be a patient responsible charge related to this service. The patient expressed understanding and agreed to proceed.  Location of Patient: Home  Location of Provider: [place]  Persons participating in Telemedicine visit: Teckla Madahi Westly Pam, NP Orlan Leavens, CMA  History of Present Illness: CC: Tingling in hands  Subjective: Kari Ortega is a 39 y.o. female  with history of hypertriglyceridemia and urine frequency who presents for tingling in hands.  1. TINGLING IN HANDS:  Onset: 1 month ago  Location/specific fingers or part of hand or wrist: both hands, entire hand Radiation: both arms Duration all day or comes and goes: comes and goes  Night time pain: denies, especially numb at night Pain Rating: 7/10, both hands which comes and goes Swelling: denies Characteristics: sharp pain both hands comes and goes, numbness and tingling both hands comes and goes Aggravating Factors: activity Relieving Factors: denies Timing: comes and goes Severity: denies Smoking: denies Occupation: housewife Pregnant: denies Breastfeeding: yes Has this happened before: denies  Reports hands feel heavy with tingling especially while driving. Reports has been taking vitamins to help blood circulate but isn't  helping.  Past Medical History:  Diagnosis Date  . Anemia   . 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  Unable to determine reason per pt.    . Irregular periods    No Known Allergies  Current Outpatient Medications on File Prior to Visit  Medication Sig Dispense Refill  . acetaminophen (TYLENOL) 500 MG tablet Take 2 tablets (1,000 mg total) by mouth every 6 (six) hours as needed. 30 tablet 0  . Cholecalciferol (VITAMIN D-3) 125 MCG (5000 UT) TABS Take 1 tablet by mouth daily. 90 tablet 3  . diazepam (VALIUM) 5 MG tablet Take 1 by mouth 1 hour  pre-procedure with very light food. May bring 2nd tablet to appointment. 2 tablet 0  . meloxicam (MOBIC) 15 MG tablet Take 0.5-1 tablets (7.5-15 mg total) by mouth daily as needed for pain. 30 tablet 6  . Prenatal Vit-Fe Fumarate-FA (MULTIVITAMIN-PRENATAL) 27-0.8 MG TABS tablet Take 1 tablet by mouth daily at 12 noon.     Current Facility-Administered Medications on File Prior to Visit  Medication Dose Route Frequency Provider Last Rate Last Admin  . polyethylene glycol powder (GLYCOLAX/MIRALAX) container 255 g  1 Container Oral Once Rasch, Anderson Malta I, NP        Observations/Objective: Alert and oriented x 3. Not in acute distress. Physical examination not completed as this is a telemedicine visit.  Assessment and Plan: 1. Numbness and tingling in both hands: -Patient having numbness and tingling of bilateral hands with some pain both of which comes and goes for the last 1 month.  -According to patient history sounds that patient may have carpal tunnel syndrome as patient is active inside and outside of the home with activities of daily living. -May apply wrist splints as needed during  both day and night to assist with maintaining wrist in neutral position to avoid aggravation through hyperextension of wrists. Wrist splints may be purchased over-the-counter. Patient agreeable. -Eliminate and avoid repetitive motions which  aggravate numbness and tingling as much as possible. -Take Ibuprofen (ADVIL) 1 tablet (600 mg total) by mouth every 8 hours as needed for intermittent pain caused by numbness and tingling of bilateral hands. -Vitamin B12 will be obtained to evaluate whether vitamin B12 deficiency may be cause of bilateral hands numbness and tingling. Patient planning to come in today to have lab drawn. -Follow-up with primary physician as needed. - Vitamin B12 - ibuprofen (ADVIL) 600 MG tablet; Take 1 tablet (600 mg total) by mouth every 8 (eight) hours as needed.  Dispense: 30 tablet; Refill: 0  2. Language barrier: English as a second language teacher participated during this visit. Interpreter Name: Dahlia Bailiff, ID#[id number]. Interpreter Name: Karna Christmas [id number].  Follow Up Instructions: Follow-up with primary physician as needed.   Patient was given clear instructions to go to Emergency Department or return to medical center if symptoms don't improve, worsen, or new problems develop.The patient verbalized understanding.  I discussed the assessment and treatment plan with the patient. The patient was provided an opportunity to ask questions and all were answered. The patient agreed with the plan and demonstrated an understanding of the instructions.   The patient was advised to call back or seek an in-person evaluation if the symptoms worsen or if the condition fails to improve as anticipated.   I provided 27 minutes total of non-face-to-face time during this encounter including median intraservice time, reviewing previous notes, labs, imaging, medications, management and patient verbalized understanding.    Camillia Herter, NP  [facility] [place], [place]   09/12/2019, 2:52 PM

## 2019-09-12 ENCOUNTER — Other Ambulatory Visit: Payer: Self-pay

## 2019-09-12 ENCOUNTER — Ambulatory Visit: Payer: Self-pay | Attending: Family | Admitting: Family

## 2019-09-12 DIAGNOSIS — R202 Paresthesia of skin: Secondary | ICD-10-CM

## 2019-09-12 DIAGNOSIS — Z789 Other specified health status: Secondary | ICD-10-CM

## 2019-09-12 DIAGNOSIS — R200 Anesthesia of skin: Secondary | ICD-10-CM

## 2019-09-12 MED ORDER — IBUPROFEN 600 MG PO TABS: 600.0000 mg | ORAL_TABLET | Freq: Three times a day (TID) | ORAL | 0 refills | Status: DC | PRN

## 2019-09-12 MED ORDER — NAPROXEN 500 MG PO TABS: 500.0000 mg | ORAL_TABLET | Freq: Two times a day (BID) | ORAL | 0 refills | Status: DC

## 2019-09-12 NOTE — Patient Instructions (Signed)
Use wrist splints as needed for numbness and tingling of hands. Take Ibuprofen as needed for pain. Vitamin B12 collected in clinic. Follow-up with primary physician as needed.   Use frulas para la mueca segn sea necesario para el entumecimiento y el hormigueo de las manos. Tome ibuprofeno segn sea necesario para Conservation officer, historic buildings. Vitamina B12 recolectada en la clnica. Realice un seguimiento con el mdico de cabecera segn sea necesario.  Sndrome del tnel carpiano Carpal Tunnel Syndrome  El sndrome del tnel carpiano es una afeccin que causa dolor en la mano y en el brazo. El tnel carpiano es un rea estrecha ubicada en el lado palmar de la [place]. Los movimientos repetidos de la mueca o determinadas enfermedades pueden causar la hinchazn del tnel. Esta hinchazn comprime el nervio principal de la mueca (nervio mediano). Cules son las causas? Esta afeccin puede ser causada por lo siguiente:  Movimientos repetidos de [employer].  Lesiones en la [place].  Artritis.  Un quiste o un tumor en el tnel carpiano.  Acumulacin de lquido Solicitor. A veces, se desconoce la causa de esta afeccin. Qu incrementa el riesgo? Los siguientes factores pueden hacer que sea ms propenso a Emergency planning/management officer afeccin:  Tener un trabajo que requiera mover repetidamente la [place] del mismo modo, por ejemplo, de carnicero o cajero.  Ser mujer.  Tener ciertas afecciones, tales como: ? Diabetes. ? Obesidad. ? Tiroidea hipoactiva (hipotiroidismo). ? Insuficiencia renal. Cules son los signos o sntomas? Los sntomas de esta afeccin incluyen:  Sensacin de hormigueo en los dedos de la mano, especialmente el pulgar, el ndice y el dedo [place].  Hormigueo o adormecimiento en la mano.  Sensacin de Social research officer, government en todo el brazo, especialmente cuando la [place] y el codo estn flexionados durante [place].  Dolor en la mueca que sube por el brazo hasta el hombro.  Dolor que baja hasta la  palma de la mano o los dedos.  Sensacin de [employer]. Tal vez tenga dificultad para tomar y Licensed conveyancer. Los sntomas pueden empeorar durante la noche. Cmo se diagnostica? Esta afeccin se diagnostica mediante los antecedentes mdicos y un examen fsico. Tambin pueden hacerle estudios, que incluyen los siguientes:  Furniture conservator/restorer electromiograma (EMG). Esta prueba mide las seales elctricas que los nervios les envan a los msculos.  Estudio de [employer]. Este estudio permite determinar si las seales elctricas pasan correctamente por los nervios.  Estudios de diagnstico por imgenes, como radiografas, una ecografa y Public house manager (RM). Estos estudios Chiropodist las posibles causas de la afeccin. Cmo se trata? El tratamiento de esta afeccin puede incluir:  Cambios en el estilo de vida. Es importante que deje o cambie la actividad que caus la afeccin.  Hacer ejercicio y actividades para fortalecer los msculos y los huesos (fisioterapia).  Aprender a usar la mano nuevamente despus del diagnstico (terapia ocupacional).  Analgsicos y antiinflamatorios. Esto puede incluir medicamentos que se inyectan en la [place].  Una frula para la [place].  Clementeen Hoof. Siga estas indicaciones en su casa: Si tiene una frula:  Use la frula como se lo haya indicado el mdico. Qutesela solamente como se lo haya indicado el mdico.  Afloje la frula si los dedos se le adormecen, siente hormigueo o se le enfran y se tornan de Optician, dispensing.  Mantenga la frula limpia.  Si la frula no es impermeable: ? No deje que se moje. ? Cbrala con un envoltorio hermtico cuando tome un bao de inmersin o [place]  ducha. Control del dolor, la rigidez y la hinchazn   Si se lo indican, aplique hielo sobre la zona del dolor: ? Si tiene una frula desmontable, qutesela como se lo haya indicado el mdico. ? Ponga el hielo en una bolsa plstica. ? Coloque una  [employer] piel y Therapist, nutritional. ? Coloque el hielo durante 70minutos, 2a3veces al da. Indicaciones generales  [employer]phi de venta libre y los recetados solamente como se lo haya indicado el mdico.  Descanse la [place] de toda actividad que le cause dolor. Si la afeccin tiene relacin con Leander Rams, hable con su empleador [employer] pueden [place], por [place], usar una almohadilla para apoyar la mueca mientras tipea.  Haga los ejercicios como se lo hayan indicado el mdico, el fisioterapeuta o el terapeuta ocupacional.  Concurra a todas las visitas de seguimiento como se lo haya indicado el mdico. Esto es importante. Comunquese con un mdico si:  Aparecen nuevos sntomas.  El dolor no se alivia con los [employer].  Sus sntomas empeoran. Solicite ayuda de inmediato si:  Tiene hormigueo o adormecimiento intensos en la mueca o la mano. Resumen  El sndrome del tnel carpiano es una afeccin que causa dolor en la mano y en el brazo.  Generalmente se debe a movimientos repetidos de [employer].  El sndrome del tnel carpiano se trata mediante cambios en el estilo de vida y medicamentos. Tambin puede indicarse la [place].  Siga las indicaciones del mdico sobre el uso de una frula, el descanso de la [place], la asistencia a las visitas de seguimiento y Solicitor para pedir ayuda. Esta informacin no tiene Marine scientist el consejo del mdico. Asegrese de hacerle al mdico cualquier pregunta que tenga. Document Revised: 10/01/2017 Document Reviewed: 10/01/2017 Elsevier Patient Education  2020 [employer].

## 2019-09-13 ENCOUNTER — Encounter: Payer: Self-pay | Admitting: Physical Therapy

## 2019-09-13 ENCOUNTER — Other Ambulatory Visit: Payer: Self-pay

## 2019-09-13 ENCOUNTER — Ambulatory Visit: Payer: Self-pay | Attending: Family Medicine | Admitting: Physical Therapy

## 2019-09-13 DIAGNOSIS — M5442 Lumbago with sciatica, left side: Secondary | ICD-10-CM | POA: Insufficient documentation

## 2019-09-13 DIAGNOSIS — R252 Cramp and spasm: Secondary | ICD-10-CM

## 2019-09-13 DIAGNOSIS — M6281 Muscle weakness (generalized): Secondary | ICD-10-CM

## 2019-09-13 DIAGNOSIS — G8929 Other chronic pain: Secondary | ICD-10-CM

## 2019-09-13 DIAGNOSIS — M533 Sacrococcygeal disorders, not elsewhere classified: Secondary | ICD-10-CM

## 2019-09-13 LAB — VITAMIN B12: Vitamin B-12: >2000 pg/mL — ABNORMAL HIGH (ref 232–1245)

## 2019-09-13 NOTE — Therapy (Signed)
[place], [place], [address] Phone: [phone]   Fax:  [phone]  Physical Therapy Treatment  Patient Details  Name: Kari Ortega MRN: [id number] Date of Birth: Oct 14, 1980 Referring Provider (PT): Dr. Eunice Blase   Encounter Date: 09/13/2019  PT End of Session - 09/13/19 1639    Visit Number  6    Date for PT Re-Evaluation  09/28/19    PT Start Time  1640   Pt. arrived late   PT Stop Time  1725    PT Time Calculation (min)  45 min    Activity Tolerance  Patient tolerated treatment well    Behavior During Therapy  [facility] for tasks assessed/performed       Past Medical History:  Diagnosis Date  . Anemia   . 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  Unable to determine reason per pt.    . Irregular periods     Past Surgical History:  Procedure Laterality Date  . CHOLECYSTECTOMY N/A 06/14/2018   Procedure: LAPAROSCOPIC CHOLECYSTECTOMY;  Surgeon: [name], [name], MD;  Location: [place];  Service: General;  Laterality: N/A;  . TOOTH EXTRACTION      There were no vitals filed for this visit.  Subjective Assessment - 09/13/19 1641    Subjective  "Right now I don't have a lot of pain, but I was in a lot of pain yesterday. My kids gave me a massage and I feel a lot better."    Limitations  Lifting;House hold activities    Patient Stated Goals  feel better    Currently in Pain?  Yes    Pain Score  3     Pain Location  Coccyx    Pain Orientation  Mid    Pain Descriptors / Indicators  Sharp    Pain Type  Chronic pain    Pain Onset  More than a month ago    Pain Frequency  Intermittent    Aggravating Factors   Pick up or carry objects    Pain Relieving Factors  Tylenol                       [facility] Adult PT Treatment/Exercise - 09/13/19 0001      Lumbar Exercises: Stretches   Passive Hamstring Stretch  Right;Left;1 rep;20 seconds    Single  Knee to Chest Stretch  1 rep;30 seconds    Piriformis Stretch  2 reps;Right;Left;30 seconds      Knee/Hip Exercises: Supine   Bridges with Clamshell  2 sets;Strengthening;Both    Other Supine Knee/Hip Exercises  Supine heel taps w/ core engaged    2 sets of 10     Knee/Hip Exercises: Sidelying   Clams  2 sets of 10 with Red TheraBand      Modalities   Modalities  Moist Heat      Moist Heat Therapy   Number Minutes Moist Heat  10 Minutes    Moist Heat Location  Lumbar Spine;Other (comment)   Coccyx     Manual Therapy   Manual therapy comments  Trigger Point technique over piriformis, glutes, Lt. Hamstring     Soft tissue mobilization  Rt piriformis, glut max, med                PT Short Term Goals - 09/01/19 1748      PT SHORT TERM GOAL #1   Title  Pt will be  independent in her HEP.    Baseline  She reports that she does some    Time  4    Period  Weeks    Status  On-going    Target Date  08/31/19      PT SHORT TERM GOAL #2   Title  able to sit with pain decreased >/= 25% in lumbar and coccyx    Baseline  Patient reports that her back does still hurt and often has to readjust    Time  4    Period  Weeks    Status  Achieved    Target Date  08/31/19      PT SHORT TERM GOAL #3   Title  understand correct body mechanics with daily tasks to reduce strain on her back    Time  4    Period  Weeks    Status  On-going        PT Long Term Goals - 08/03/19 1652      PT LONG TERM GOAL #1   Title  Independent with advanced HEP and how to progress herself    Time  8    Period  Weeks    Status  New    Target Date  09/28/19      PT LONG TERM GOAL #2   Title  able to sit and stand with minimal pain due to pelvis and coccyx in correct alignment    Baseline  ---    Time  8    Period  Weeks    Status  New    Target Date  09/28/19      PT LONG TERM GOAL #3   Title  Pt will be able to report minimal to no pain while sitting >/= 30 minutes.    Time  8    Period   Weeks    Status  New    Target Date  09/28/19      PT LONG TERM GOAL #4   Title  Pt will be able to amb 1/2 mile on community surfaces with minimal  pain reported.    Baseline  ---    Time  8    Period  Weeks    Status  New    Target Date  09/28/19      PT LONG TERM GOAL #5   Title  Pt will be able to bend and lift objects (10 pounds) off the floor with correct body mechanics and no pain reported.    Baseline  ---    Time  8    Period  Weeks    Status  New    Target Date  09/28/19            Plan - 09/13/19 1742    Clinical Impression Statement  Today Kari Ortega presents with a reduction in pain after she received a massage. Trigger Point techniques were used over her piriformis, glutes, and hamstrings. She was able to tolerate the hip strengthening and core exercises provided, but has marked weakness in both areas. She reported a reduction in pain after the session. Heat was applied to her lower back. She would benefit from PT to further address coccyx pain and strength deficits.    Personal Factors and Comorbidities  Age;Fitness    Examination-Activity Limitations  Lift;Bend;Sit;Locomotion Level    Examination-Participation Restrictions  Community Activity;Driving;Interpersonal Relationship;Meal Prep;Cleaning    Stability/Clinical Decision Making  Evolving/Moderate complexity    Clinical Decision Making  Moderate  Rehab Potential  Good    PT Frequency  2x / week    PT Duration  8 weeks    PT Treatment/Interventions  Biofeedback;Cryotherapy;Electrical Stimulation;Therapeutic activities;Therapeutic exercise;Neuromuscular re-education;Manual techniques;Patient/family education;Dry needling;Spinal Manipulations;Joint Manipulations    PT Next Visit Plan  review core HEP, lumbopelvic strength, DN    PT Home Exercise Plan  Access Code: [email or web address]    Consulted and Agree with Plan of Care  Patient       Patient will benefit from skilled therapeutic intervention in order to improve  the following deficits and impairments:  Decreased coordination, Decreased range of motion, Increased fascial restricitons, Pain, Decreased activity tolerance, Decreased strength, Decreased mobility, Increased muscle spasms  Visit Diagnosis: Cramp and spasm  Coccyx pain  Muscle weakness (generalized)  Chronic bilateral low back pain with left-sided sciatica     Problem List Patient Active Problem List   Diagnosis Date Noted  . Symptomatic cholelithiasis 06/13/2018  . Postpartum care and examination 04/29/2018  . Urine frequency 04/29/2018  . Normal labor 03/18/2018  . 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 BMI 30-39.9 09/21/2017  . Obesity in pregnancy 09/21/2017  . Language barrier 09/21/2017  . Supervision of high risk pregnancy, antepartum 08/24/2017  . AMA (advanced maternal age) multigravida 35+ 08/24/2017  . HYPERTRIGLYCERIDEMIA 12/15/2006    Laveda Norman, SPT 09/13/2019, 5:57 PM  [facility] [address] [place], [place], [address] Phone: [phone]   Fax:  [phone]  Name: Kari Ortega MRN: [id number] Date of Birth: 01-26-1981

## 2019-09-13 NOTE — Progress Notes (Signed)
Patient's vitamin B12 higher than normal.   Vitamin B12 is water soluble meaning most of the patient's excess vitamin B12 will most likely be excreted with urination.   Follow-up with primary physician at which time a CMP and CBC may be considered to evaluate patient's kidney function, liver function, and blood count.

## 2019-09-14 ENCOUNTER — Telehealth: Payer: Self-pay

## 2019-09-14 NOTE — Telephone Encounter (Signed)
[place] [employer] [facility]  Id# [phone]  contacted pt to go over lab results pt is aware and doesn't have any questions or concerns

## 2019-09-15 ENCOUNTER — Encounter: Payer: Self-pay | Admitting: Physical Therapy

## 2019-09-16 ENCOUNTER — Ambulatory Visit: Payer: Self-pay | Admitting: Physical Therapy

## 2019-09-16 ENCOUNTER — Other Ambulatory Visit: Payer: Self-pay

## 2019-09-16 ENCOUNTER — Encounter: Payer: Self-pay | Admitting: Physical Therapy

## 2019-09-16 DIAGNOSIS — M6281 Muscle weakness (generalized): Secondary | ICD-10-CM

## 2019-09-16 DIAGNOSIS — R252 Cramp and spasm: Secondary | ICD-10-CM

## 2019-09-16 DIAGNOSIS — M5442 Lumbago with sciatica, left side: Secondary | ICD-10-CM

## 2019-09-16 DIAGNOSIS — M533 Sacrococcygeal disorders, not elsewhere classified: Secondary | ICD-10-CM

## 2019-09-16 NOTE — Therapy (Signed)
[place], [place], [address] Phone: [phone]   Fax:  [phone]  Physical Therapy Treatment  Patient Details  Name: Kari Ortega MRN: [id number] Date of Birth: 07-Mar-1981 Referring Provider (PT): Dr. Eunice Blase   Encounter Date: 09/16/2019  PT End of Session - 09/16/19 1152    Visit Number  7    Date for PT Re-Evaluation  09/28/19    PT Start Time  1152   Pt. arrived late   PT Stop Time  1234    PT Time Calculation (min)  42 min    Activity Tolerance  Patient tolerated treatment well    Behavior During Therapy  WFL for tasks assessed/performed       Past Medical History:  Diagnosis Date  . Anemia   . 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  Unable to determine reason per pt.    . Irregular periods     Past Surgical History:  Procedure Laterality Date  . CHOLECYSTECTOMY N/A 06/14/2018   Procedure: LAPAROSCOPIC CHOLECYSTECTOMY;  Surgeon: [name], [name], MD;  Location: [facility];  Service: General;  Laterality: N/A;  . TOOTH EXTRACTION      There were no vitals filed for this visit.  Subjective Assessment - 09/16/19 1156    Subjective  "I have been doing a little better. The exercises and massages give me some relief. I have on a compression belt, and it helps me feel better."    Pain Score  2     Pain Location  Coccyx    Pain Orientation  Mid    Pain Descriptors / Indicators  Sharp    Pain Type  Chronic pain    Pain Onset  More than a month ago    Pain Frequency  Intermittent    Aggravating Factors   Pick up or carry objects    Pain Relieving Factors  Tylenol                       [facility] Adult PT Treatment/Exercise - 09/16/19 0001      Lumbar Exercises: Stretches   Passive Hamstring Stretch  Right;Left;1 rep;30 seconds      Knee/Hip Exercises: Supine   Hip Adduction Isometric  Strengthening;Both;10 reps    Hip Adduction  Isometric Limitations  w/ ball squeeze and legs raised     Bridges with Clamshell  AROM;1 set;Both;10 reps    Other Supine Knee/Hip Exercises  V-ups with ball switch      Knee/Hip Exercises: Sidelying   Hip ABduction  Strengthening;1 set;Both   w/ 5lb weight      Manual Therapy   Manual therapy comments  Trigger Point technique over piriformis, glutes, Lt. Hamstring                PT Short Term Goals - 09/01/19 1748      PT SHORT TERM GOAL #1   Title  Pt will be independent in her HEP.    Baseline  She reports that she does some    Time  4    Period  Weeks    Status  On-going    Target Date  08/31/19      PT SHORT TERM GOAL #2   Title  able to sit with pain decreased >/= 25% in lumbar and coccyx    Baseline  Patient reports that her back does still hurt and often has to readjust  Time  4    Period  Weeks    Status  Achieved    Target Date  08/31/19      PT SHORT TERM GOAL #3   Title  understand correct body mechanics with daily tasks to reduce strain on her back    Time  4    Period  Weeks    Status  On-going        PT Long Term Goals - 08/03/19 1652      PT LONG TERM GOAL #1   Title  Independent with advanced HEP and how to progress herself    Time  8    Period  Weeks    Status  New    Target Date  09/28/19      PT LONG TERM GOAL #2   Title  able to sit and stand with minimal pain due to pelvis and coccyx in correct alignment    Baseline  ---    Time  8    Period  Weeks    Status  New    Target Date  09/28/19      PT LONG TERM GOAL #3   Title  Pt will be able to report minimal to no pain while sitting >/= 30 minutes.    Time  8    Period  Weeks    Status  New    Target Date  09/28/19      PT LONG TERM GOAL #4   Title  Pt will be able to amb 1/2 mile on community surfaces with minimal  pain reported.    Baseline  ---    Time  8    Period  Weeks    Status  New    Target Date  09/28/19      PT LONG TERM GOAL #5   Title  Pt will be able  to bend and lift objects (10 pounds) off the floor with correct body mechanics and no pain reported.    Baseline  ---    Time  8    Period  Weeks    Status  New    Target Date  09/28/19            Plan - 09/16/19 1357    Clinical Impression Statement  Patient presents to the clinic with decreased coccyx pain. She reports that manual therapy and massages help. She still as a lot of trigger points in her quads, IT Band, glutes, and hamstring region. She demonstrates an increase in core strength. She indicated that an SIJ belt made her feel better. She was able to tolerate all of the exercises provided. We discussed DN for her next session. She would benefit from PT to further address pain.    Personal Factors and Comorbidities  Age;Fitness    Examination-Activity Limitations  Lift;Bend;Sit;Locomotion Level    Examination-Participation Restrictions  Community Activity;Driving;Interpersonal Relationship;Meal Prep;Cleaning    Stability/Clinical Decision Making  Evolving/Moderate complexity    Clinical Decision Making  Moderate    Rehab Potential  Good    PT Frequency  2x / week    PT Duration  8 weeks    PT Treatment/Interventions  Biofeedback;Cryotherapy;Electrical Stimulation;Therapeutic activities;Therapeutic exercise;Neuromuscular re-education;Manual techniques;Patient/family education;Dry needling;Spinal Manipulations;Joint Manipulations    PT Next Visit Plan  review core HEP, lumbopelvic strength, DN    PT Home Exercise Plan  Access Code: [email or web address]    Consulted and Agree with Plan of Care  Patient       Patient  will benefit from skilled therapeutic intervention in order to improve the following deficits and impairments:  Decreased coordination, Decreased range of motion, Increased fascial restricitons, Pain, Decreased activity tolerance, Decreased strength, Decreased mobility, Increased muscle spasms  Visit Diagnosis: Cramp and spasm  Coccyx pain  Chronic bilateral low back pain  with left-sided sciatica  Muscle weakness (generalized)     Problem List Patient Active Problem List   Diagnosis Date Noted  . Symptomatic cholelithiasis 06/13/2018  . Postpartum care and examination 04/29/2018  . Urine frequency 04/29/2018  . Normal labor 03/18/2018  . 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 BMI 30-39.9 09/21/2017  . Obesity in pregnancy 09/21/2017  . Language barrier 09/21/2017  . Supervision of high risk pregnancy, antepartum 08/24/2017  . AMA (advanced maternal age) multigravida 35+ 08/24/2017  . HYPERTRIGLYCERIDEMIA 12/15/2006    Laveda Norman, SPT 09/16/2019, 2:07 PM  [facility] [address] [place], [place], [address] Phone: [phone]   Fax:  [phone]  Name: Kari Ortega MRN: [id number] Date of Birth: 1980/10/01

## 2019-09-19 ENCOUNTER — Encounter: Payer: Self-pay | Admitting: Physical Therapy

## 2019-09-20 ENCOUNTER — Ambulatory Visit: Payer: Self-pay | Admitting: Physical Therapy

## 2019-09-20 ENCOUNTER — Other Ambulatory Visit: Payer: Self-pay

## 2019-09-20 DIAGNOSIS — M6281 Muscle weakness (generalized): Secondary | ICD-10-CM

## 2019-09-20 DIAGNOSIS — G8929 Other chronic pain: Secondary | ICD-10-CM

## 2019-09-20 DIAGNOSIS — M5442 Lumbago with sciatica, left side: Secondary | ICD-10-CM

## 2019-09-20 DIAGNOSIS — M533 Sacrococcygeal disorders, not elsewhere classified: Secondary | ICD-10-CM

## 2019-09-20 DIAGNOSIS — R252 Cramp and spasm: Secondary | ICD-10-CM

## 2019-09-20 NOTE — Therapy (Signed)
[place], [place], [address] Phone: [phone]   Fax:  [phone]  Physical Therapy Treatment  Patient Details  Name: Kari Ortega MRN: [id number] Date of Birth: 11/05/1980 Referring Provider (PT): Dr. Eunice Blase   Encounter Date: 09/20/2019  PT End of Session - 09/20/19 1544    Visit Number  8    Date for PT Re-Evaluation  09/28/19    PT Start Time  [id number]    PT Stop Time  1630    PT Time Calculation (min)  46 min    Activity Tolerance  Patient tolerated treatment well    Behavior During Therapy  [facility] for tasks assessed/performed       Past Medical History:  Diagnosis Date  . Anemia   . 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  Unable to determine reason per pt.    . Irregular periods     Past Surgical History:  Procedure Laterality Date  . CHOLECYSTECTOMY N/A 06/14/2018   Procedure: LAPAROSCOPIC CHOLECYSTECTOMY;  Surgeon: [name], [name], MD;  Location: [place];  Service: General;  Laterality: N/A;  . TOOTH EXTRACTION      There were no vitals filed for this visit.  Subjective Assessment - 09/20/19 1545    Subjective  I have pain in my side , rated 3/10.  Mu husband massaged it and it made it better.    Currently in Pain?  Yes    Pain Score  3     Pain Location  Leg    Pain Orientation  Left;Lateral;Posterior    Pain Descriptors / Indicators  Aching;Sore    Pain Type  Chronic pain    Pain Onset  More than a month ago        [facility] Adult PT Treatment/Exercise - 09/20/19 0001      Self-Care   Self-Care  Other Self-Care Comments    Other Self-Care Comments   stretch vs core strength, pain vs core fatigue , POC, DN       Lumbar Exercises: Stretches   Active Hamstring Stretch  2 reps;30 seconds      Lumbar Exercises: Supine   Bent Knee Raise  15 reps    Bent Knee Raise Limitations  table top 90 /90 hands under tail , pain     Bridge  15  reps    Advanced Lumbar Stabilization Limitations  unable to do ball pass or dead bug today, more advanced       Lumbar Exercises: Sidelying   Clam  20 reps      Moist Heat Therapy   Number Minutes Moist Heat  10 Minutes    Moist Heat Location  Lumbar Spine;Hip      Manual Therapy   Soft tissue mobilization  L ITB, quads, piriformis , hamstring   L Quadratus lumborum trigger point               PT Short Term Goals - 09/01/19 1748      PT SHORT TERM GOAL #1   Title  Pt will be independent in her HEP.    Baseline  She reports that she does some    Time  4    Period  Weeks    Status  On-going    Target Date  08/31/19      PT SHORT TERM GOAL #2   Title  able to sit with pain decreased >/= 25% in lumbar and coccyx  Baseline  Patient reports that her back does still hurt and often has to readjust    Time  4    Period  Weeks    Status  Achieved    Target Date  08/31/19      PT SHORT TERM GOAL #3   Title  understand correct body mechanics with daily tasks to reduce strain on her back    Time  4    Period  Weeks    Status  On-going        PT Long Term Goals - 08/03/19 1652      PT LONG TERM GOAL #1   Title  Independent with advanced HEP and how to progress herself    Time  8    Period  Weeks    Status  New    Target Date  09/28/19      PT LONG TERM GOAL #2   Title  able to sit and stand with minimal pain due to pelvis and coccyx in correct alignment    Baseline  ---    Time  8    Period  Weeks    Status  New    Target Date  09/28/19      PT LONG TERM GOAL #3   Title  Pt will be able to report minimal to no pain while sitting >/= 30 minutes.    Time  8    Period  Weeks    Status  New    Target Date  09/28/19      PT LONG TERM GOAL #4   Title  Pt will be able to amb 1/2 mile on community surfaces with minimal  pain reported.    Baseline  ---    Time  8    Period  Weeks    Status  New    Target Date  09/28/19      PT LONG TERM GOAL #5   Title   Pt will be able to bend and lift objects (10 pounds) off the floor with correct body mechanics and no pain reported.    Baseline  ---    Time  8    Period  Weeks    Status  New    Target Date  09/28/19            Plan - 09/20/19 1554    Clinical Impression Statement  Pt expected to get DN today, but this PT does not provide this service.  We focused on core routine, for which she needed max cues for.  Modified so she could perform better. Will benefit from skilled PT for continued manual work and core    PT Treatment/Interventions  Biofeedback;Cryotherapy;Electrical Stimulation;Therapeutic activities;Therapeutic exercise;Neuromuscular re-education;Manual techniques;Patient/family education;Dry needling;Spinal Manipulations;Joint Manipulations    PT Next Visit Plan  GOALS review core HEP, lumbopelvic strength, DN    PT Home Exercise Plan  Access Code: [email or web address], added supine march instead of ball pass (too challenging)    Consulted and Agree with Plan of Care  Patient       Patient will benefit from skilled therapeutic intervention in order to improve the following deficits and impairments:  Decreased coordination, Decreased range of motion, Increased fascial restricitons, Pain, Decreased activity tolerance, Decreased strength, Decreased mobility, Increased muscle spasms  Visit Diagnosis: Cramp and spasm  Coccyx pain  Chronic bilateral low back pain with left-sided sciatica  Muscle weakness (generalized)     Problem List Patient Active Problem List   Diagnosis Date  Noted  . Symptomatic cholelithiasis 06/13/2018  . Postpartum care and examination 04/29/2018  . Urine frequency 04/29/2018  . Normal labor 03/18/2018  . 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 BMI 30-39.9 09/21/2017  . Obesity in pregnancy 09/21/2017  . Language barrier 09/21/2017  . Supervision of high risk pregnancy, antepartum 08/24/2017  . AMA (advanced maternal age) multigravida 35+ 08/24/2017  .  HYPERTRIGLYCERIDEMIA 12/15/2006    Trey Gulbranson 09/20/2019, 4:27 PM  [facility] [address] [place], [place], [address] Phone: [phone]   Fax:  [phone]  Name: Kari Ortega MRN: [id number] Date of Birth: 08-03-1980  Raeford Razor, PT 09/20/19 4:27 PM Phone: [phone] Fax: [phone]

## 2019-09-22 ENCOUNTER — Encounter: Payer: Self-pay | Admitting: Physical Therapy

## 2019-09-22 ENCOUNTER — Other Ambulatory Visit: Payer: Self-pay

## 2019-09-22 ENCOUNTER — Ambulatory Visit: Payer: Self-pay | Admitting: Physical Therapy

## 2019-09-22 DIAGNOSIS — M6281 Muscle weakness (generalized): Secondary | ICD-10-CM

## 2019-09-22 DIAGNOSIS — M533 Sacrococcygeal disorders, not elsewhere classified: Secondary | ICD-10-CM

## 2019-09-22 DIAGNOSIS — G8929 Other chronic pain: Secondary | ICD-10-CM

## 2019-09-22 DIAGNOSIS — R252 Cramp and spasm: Secondary | ICD-10-CM

## 2019-09-22 NOTE — Therapy (Signed)
[place], [place], [address] Phone: [phone]   Fax:  [phone]  Physical Therapy Treatment  Patient Details  Name: Kari Ortega MRN: [id number] Date of Birth: 02-12-1981 Referring Provider (PT): Dr. Eunice Blase   Encounter Date: 09/22/2019  PT End of Session - 09/22/19 1630    Visit Number  9    Date for PT Re-Evaluation  09/28/19    Authorization Type  CAFA    PT Start Time  [id number]    PT Stop Time  1510    PT Time Calculation (min)  50 min    Activity Tolerance  Patient tolerated treatment well    Behavior During Therapy  [facility] for tasks assessed/performed       Past Medical History:  Diagnosis Date  . Anemia   . 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  Unable to determine reason per pt.    . Irregular periods     Past Surgical History:  Procedure Laterality Date  . CHOLECYSTECTOMY N/A 06/14/2018   Procedure: LAPAROSCOPIC CHOLECYSTECTOMY;  Surgeon: [name], [name], MD;  Location: [place];  Service: General;  Laterality: N/A;  . TOOTH EXTRACTION      There were no vitals filed for this visit.  Subjective Assessment - 09/22/19 1626    Subjective  I have pain in my stomach, from the exercises.  Back always hurts when I sit.  I am a bit better though.    Currently in Pain?  Yes    Pain Score  3         [facility] Adult PT Treatment/Exercise - 09/22/19 0001      Lumbar Exercises: Stretches   Active Hamstring Stretch  2 reps;30 seconds    Lower Trunk Rotation  10 seconds    Lower Trunk Rotation Limitations  x 10       Lumbar Exercises: Supine   Bent Knee Raise  10 reps    Bridge  10 reps    Bridge with [employer]  10 reps    Bridge with [employer] Limitations  then iso hold x 10     Bridge with March  10 reps    Straight Leg Raise  10 reps      Lumbar Exercises: Quadruped   Other Quadruped Lumbar Exercises  childs pose forward /rocking x 5  and then lateral bias for L side       Knee/Hip Exercises: Stretches   Piriformis Stretch  3 reps;30 seconds    Piriformis Stretch Limitations  seated and supine       Knee/Hip Exercises: Aerobic   Nustep  6 min L5 UE and LE       Manual Therapy   Manual therapy comments  glute med compression with hip ER and IR on L side     Soft tissue mobilization  L piriformis release              PT Education - 09/22/19 2044    Education Details  rationale for warm up, pain vs fatigue during an exercise    Person(s) Educated  Patient    Methods  Explanation    Comprehension  Verbalized understanding       PT Short Term Goals - 09/22/19 2045      PT SHORT TERM GOAL #1   Title  Pt will be independent in her HEP.    Status  Achieved  PT SHORT TERM GOAL #2   Title  able to sit with pain decreased >/= 25% in lumbar and coccyx    Status  Achieved      PT SHORT TERM GOAL #3   Title  understand correct body mechanics with daily tasks to reduce strain on her back    Status  On-going        PT Long Term Goals - 08/03/19 1652      PT LONG TERM GOAL #1   Title  Independent with advanced HEP and how to progress herself    Time  8    Period  Weeks    Status  New    Target Date  09/28/19      PT LONG TERM GOAL #2   Title  able to sit and stand with minimal pain due to pelvis and coccyx in correct alignment    Baseline  ---    Time  8    Period  Weeks    Status  New    Target Date  09/28/19      PT LONG TERM GOAL #3   Title  Pt will be able to report minimal to no pain while sitting >/= 30 minutes.    Time  8    Period  Weeks    Status  New    Target Date  09/28/19      PT LONG TERM GOAL #4   Title  Pt will be able to amb 1/2 mile on community surfaces with minimal  pain reported.    Baseline  ---    Time  8    Period  Weeks    Status  New    Target Date  09/28/19      PT LONG TERM GOAL #5   Title  Pt will be able to bend and lift objects (10 pounds) off the  floor with correct body mechanics and no pain reported.    Baseline  ---    Time  8    Period  Weeks    Status  New    Target Date  09/28/19            Plan - 09/22/19 1631    Clinical Impression Statement  Pain reduced after NuStep today. LLE more painful with stretching (tightness). Seems to be improving but still has tightness in hips, pain in back. Needs education about body mechanics, increased time to understand her condition and reasons for pain. Likely will be renewed for more PT.    PT Treatment/Interventions  Biofeedback;Cryotherapy;Electrical Stimulation;Therapeutic activities;Therapeutic exercise;Neuromuscular re-education;Manual techniques;Patient/family education;Dry needling;Spinal Manipulations;Joint Manipulations    PT Next Visit Plan  renew v DC, goals    PT Home Exercise Plan  Access Code: [email or web address], added supine march instead of ball pass (too challenging)    Consulted and Agree with Plan of Care  Patient       Patient will benefit from skilled therapeutic intervention in order to improve the following deficits and impairments:  Decreased coordination, Decreased range of motion, Increased fascial restricitons, Pain, Decreased activity tolerance, Decreased strength, Decreased mobility, Increased muscle spasms  Visit Diagnosis: Cramp and spasm  Coccyx pain  Chronic bilateral low back pain with left-sided sciatica  Muscle weakness (generalized)     Problem List Patient Active Problem List   Diagnosis Date Noted  . Symptomatic cholelithiasis 06/13/2018  . Postpartum care and examination 04/29/2018  . Urine frequency 04/29/2018  . Normal labor 03/18/2018  . Paralysis of  the face 03/01/2018  . BMI 30-39.9 09/21/2017  . Obesity in pregnancy 09/21/2017  . Language barrier 09/21/2017  . Supervision of high risk pregnancy, antepartum 08/24/2017  . AMA (advanced maternal age) multigravida 35+ 08/24/2017  . HYPERTRIGLYCERIDEMIA 12/15/2006     Wenzel Backlund 09/22/2019, 8:48 PM  [facility] [address] [place], [place], [address] Phone: [phone]   Fax:  [phone]  Name: Kari Ortega MRN: [id number] Date of Birth: [birth year]  Raeford Razor, PT 09/22/19 8:49 PM Phone: [phone] Fax: [phone]

## 2019-09-27 ENCOUNTER — Ambulatory Visit: Payer: Self-pay | Admitting: Physical Therapy

## 2019-09-29 ENCOUNTER — Ambulatory Visit: Payer: Self-pay | Admitting: Physical Therapy

## 2019-09-29 ENCOUNTER — Other Ambulatory Visit: Payer: Self-pay

## 2019-09-29 ENCOUNTER — Encounter: Payer: Self-pay | Admitting: Physical Therapy

## 2019-09-29 DIAGNOSIS — M533 Sacrococcygeal disorders, not elsewhere classified: Secondary | ICD-10-CM

## 2019-09-29 DIAGNOSIS — R252 Cramp and spasm: Secondary | ICD-10-CM

## 2019-09-29 DIAGNOSIS — M6281 Muscle weakness (generalized): Secondary | ICD-10-CM

## 2019-09-29 DIAGNOSIS — G8929 Other chronic pain: Secondary | ICD-10-CM

## 2019-09-30 ENCOUNTER — Ambulatory Visit: Payer: Self-pay | Admitting: Family Medicine

## 2019-09-30 NOTE — Therapy (Signed)
[place], [place], [address] Phone: [phone]   Fax:  [phone]  Physical Therapy Treatment  Patient Details  Name: Kari Ortega MRN: [id number] Date of Birth: 25-Mar-1981 Referring Provider (PT): Dr. Eunice Blase   Encounter Date: 09/29/2019  PT End of Session - 09/29/19 1542    Visit Number  10    Number of Visits  18    Date for PT Re-Evaluation  11/03/19    Authorization Type  CAFA    PT Start Time  1534    PT Stop Time  1630    PT Time Calculation (min)  56 min    Activity Tolerance  Patient tolerated treatment well    Behavior During Therapy  [facility] for tasks assessed/performed       Past Medical History:  Diagnosis Date  . Anemia   . 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  Unable to determine reason per pt.    . Irregular periods     Past Surgical History:  Procedure Laterality Date  . CHOLECYSTECTOMY N/A 06/14/2018   Procedure: LAPAROSCOPIC CHOLECYSTECTOMY;  Surgeon: [name], [name], MD;  Location: [place];  Service: General;  Laterality: N/A;  . TOOTH EXTRACTION      There were no vitals filed for this visit.  Subjective Assessment - 09/29/19 1538    Subjective  HAd some tailbone pain yesterday I did my exercises and the pain was gone.  When I lift my leg I have some pulling, pain .    Currently in Pain?  Yes    Pain Score  3     Pain Location  Back   and L hip   Pain Orientation  Left    Pain Descriptors / Indicators  Aching    Pain Type  Chronic pain    Pain Onset  More than a month ago    Pain Frequency  Intermittent    Aggravating Factors   transfers , pick up items, lift L leg, walking    Pain Relieving Factors  tylenol, sometimes exercises         [facility] PT Assessment - 09/30/19 0001      Assessment   Medical Diagnosis  Pain in the coccyx    Referring Provider (PT)  Dr. Legrand Como Hilts    Hand Dominance  Right    Prior  Therapy  Yes       Precautions   Precautions  None      AROM   Lumbar Flexion  [facility]    Lumbar Extension  [facility]    Lumbar - Right Side Bend  WFL   min pain    Lumbar - Left Side Bend  WFL   pain , stretch on L    Lumbar - Right Rotation  WFL    Lumbar - Left Rotation  [facility]      Strength   Right Hip Flexion  4+/5    Left Hip Flexion  3+/5    Left Hip Extension  4/5    Left Hip ABduction  4/5          [facility] Adult PT Treatment/Exercise - 09/30/19 0001      Therapeutic Activites    Therapeutic Activities  Lifting    Lifting  various sized objects without weight, squatting and hingin vs bending from waist       Knee/Hip Exercises: Stretches   Piriformis Stretch  3 reps;30 seconds  Knee/Hip Exercises: Aerobic   Tread Mill  1.6 mph - 1.9 mph for 6 min max cues to take normal strides and not push into the belt.  Had nevere been on a TM before.       Moist Heat Therapy   Number Minutes Moist Heat  10 Minutes    Moist Heat Location  Hip       PT Education - 09/30/19 0737    Education Details  lifting, body mechanics    Person(s) Educated  Patient    Methods  Explanation;Demonstration;Verbal cues    Comprehension  Verbalized understanding;Need further instruction       PT Short Term Goals - 09/29/19 1542      PT SHORT TERM GOAL #1   Title  Pt will be independent in her HEP.    Status  Achieved      PT SHORT TERM GOAL #2   Title  able to sit with pain decreased >/= 25% in lumbar and coccyx    Baseline  a little better    Status  Achieved      PT SHORT TERM GOAL #3   Title  understand correct body mechanics with daily tasks to reduce strain on her back        PT Long Term Goals - 09/29/19 1543      PT LONG TERM GOAL #1   Title  Independent with advanced HEP and how to progress herself    Status  On-going      PT LONG TERM GOAL #2   Title  able to sit and stand with minimal pain due to pelvis and coccyx in correct alignment    Baseline  depends on the chair  and how she is sitting    Status  On-going      PT LONG TERM GOAL #3   Title  Pt will be able to report minimal to no pain while sitting >/= 30 minutes.    Baseline  but > 30 min has pain in back    Status  Achieved      PT LONG TERM GOAL #4   Title  Pt will be able to amb 1/2 mile on community surfaces with minimal  pain reported.    Status  On-going      PT LONG TERM GOAL #5   Title  Pt will be able to bend and lift objects (10 pounds) off the floor with correct body mechanics and no pain reported.    Status  On-going            Plan - 09/30/19 [id number]    Clinical Impression Statement  Patient cont to have L sided weakness in hip and pain in L low back.  Does not know how to safely lift her 30 lb + child. Does her HEP and finds some relief.  She has trouble lifting LLE when geting in and out of the car, bed as well as walking.    PT Treatment/Interventions  Biofeedback;Cryotherapy;Electrical Stimulation;Therapeutic activities;Therapeutic exercise;Neuromuscular re-education;Manual techniques;Patient/family education;Dry needling;Spinal Manipulations;Joint Manipulations    PT Next Visit Plan  lift from floor, hinging.  Manual to L leg, hip, back if needed.  Encourage stretching, activity    PT Home Exercise Plan  Access Code: [email or web address], added supine march instead of ball pass (too challenging)    Consulted and Agree with Plan of Care  Patient       Patient will benefit from skilled therapeutic intervention in order to improve the following  deficits and impairments:  Decreased coordination, Decreased range of motion, Increased fascial restricitons, Pain, Decreased activity tolerance, Decreased strength, Decreased mobility, Increased muscle spasms  Visit Diagnosis: Cramp and spasm  Coccyx pain  Chronic bilateral low back pain with left-sided sciatica  Muscle weakness (generalized)     Problem List Patient Active Problem List   Diagnosis Date Noted  . Symptomatic  cholelithiasis 06/13/2018  . Postpartum care and examination 04/29/2018  . Urine frequency 04/29/2018  . Normal labor 03/18/2018  . 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 BMI 30-39.9 09/21/2017  . Obesity in pregnancy 09/21/2017  . Language barrier 09/21/2017  . Supervision of high risk pregnancy, antepartum 08/24/2017  . AMA (advanced maternal age) multigravida 35+ 08/24/2017  . HYPERTRIGLYCERIDEMIA 12/15/2006    Evertt Chouinard 09/30/2019, 9:35 AM  [facility] [address] [place], [place], [address] Phone: [phone]   Fax:  [phone]  Name: Kallista Lacy MRN: [id number] Date of Birth: 01-23-1981  Raeford Razor, PT 09/30/19 9:36 AM Phone: [phone] Fax: [phone]

## 2019-10-04 ENCOUNTER — Ambulatory Visit: Payer: Self-pay | Admitting: Physical Therapy

## 2019-10-04 ENCOUNTER — Other Ambulatory Visit: Payer: Self-pay

## 2019-10-04 ENCOUNTER — Encounter: Payer: Self-pay | Admitting: Physical Therapy

## 2019-10-04 DIAGNOSIS — M5442 Lumbago with sciatica, left side: Secondary | ICD-10-CM

## 2019-10-04 DIAGNOSIS — R252 Cramp and spasm: Secondary | ICD-10-CM

## 2019-10-04 DIAGNOSIS — M533 Sacrococcygeal disorders, not elsewhere classified: Secondary | ICD-10-CM

## 2019-10-04 DIAGNOSIS — M6281 Muscle weakness (generalized): Secondary | ICD-10-CM

## 2019-10-04 NOTE — Therapy (Signed)
[place], [place], [address] Phone: [phone]   Fax:  [phone]  Physical Therapy Treatment  Patient Details  Name: Kari Ortega MRN: [id number] Date of Birth: January 03, 1981 Referring Provider (PT): Dr. Eunice Blase   Encounter Date: 10/04/2019  PT End of Session - 10/04/19 1643    Visit Number  11    Number of Visits  18    Date for PT Re-Evaluation  11/03/19    Authorization Type  CAFA    PT Start Time  1635    PT Stop Time  1713    PT Time Calculation (min)  38 min    Activity Tolerance  Patient tolerated treatment well    Behavior During Therapy  [facility] for tasks assessed/performed       Past Medical History:  Diagnosis Date  . Anemia   . 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  Unable to determine reason per pt.    . Irregular periods     Past Surgical History:  Procedure Laterality Date  . CHOLECYSTECTOMY N/A 06/14/2018   Procedure: LAPAROSCOPIC CHOLECYSTECTOMY;  Surgeon: [name], [name], MD;  Location: [place];  Service: General;  Laterality: N/A;  . TOOTH EXTRACTION      There were no vitals filed for this visit.  Subjective Assessment - 10/04/19 1642    Subjective  A little bit of pain in left knee and tailbone. When I walk too much, about 2 hours, it hurts. I did not notice when it started. I walked around outside a bit with my son for exercise. Its gotten a little better.                        [facility] Adult PT Treatment/Exercise - 10/04/19 0001      Knee/Hip Exercises: Stretches   Gastroc Stretch Limitations  slant board      Knee/Hip Exercises: Aerobic   Nustep  7 min L5 UE & LE      Knee/Hip Exercises: Supine   Other Supine Knee/Hip Exercises  ball squeeze with posterior pelvic tilt    Other Supine Knee/Hip Exercises  hooklying leg lengthening ball bw knees      Knee/Hip Exercises: Sidelying   Other Sidelying  Knee/Hip Exercises  Lt sidelying 90/90 for Rt SIJ   constant cues required     Manual Therapy   Soft tissue mobilization  Rt piriformis release               PT Short Term Goals - 09/29/19 1542      PT SHORT TERM GOAL #1   Title  Pt will be independent in her HEP.    Status  Achieved      PT SHORT TERM GOAL #2   Title  able to sit with pain decreased >/= 25% in lumbar and coccyx    Baseline  a little better    Status  Achieved      PT SHORT TERM GOAL #3   Title  understand correct body mechanics with daily tasks to reduce strain on her back        PT Long Term Goals - 09/29/19 1543      PT LONG TERM GOAL #1   Title  Independent with advanced HEP and how to progress herself    Status  On-going      PT LONG TERM GOAL #2   Title  able to  sit and stand with minimal pain due to pelvis and coccyx in correct alignment    Baseline  depends on the chair and how she is sitting    Status  On-going      PT LONG TERM GOAL #3   Title  Pt will be able to report minimal to no pain while sitting >/= 30 minutes.    Baseline  but > 30 min has pain in back    Status  Achieved      PT LONG TERM GOAL #4   Title  Pt will be able to amb 1/2 mile on community surfaces with minimal  pain reported.    Status  On-going      PT LONG TERM GOAL #5   Title  Pt will be able to bend and lift objects (10 pounds) off the floor with correct body mechanics and no pain reported.    Status  On-going            Plan - 10/04/19 1714    Clinical Impression Statement  time needed for cuing and education for exercise performance today. Reported very minimal pain in her tailbone following treatment today.    PT Treatment/Interventions  Biofeedback;Cryotherapy;Electrical Stimulation;Therapeutic activities;Therapeutic exercise;Neuromuscular re-education;Manual techniques;Patient/family education;Dry needling;Spinal Manipulations;Joint Manipulations    PT Next Visit Plan  Continue PRI exercise,  lift from floor, hinging.  Manual to L leg, hip, back if needed.  Encourage stretching, activity    PT Home Exercise Plan  Access Code: [email or web address], added supine march instead of ball pass (too challenging)    Consulted and Agree with Plan of Care  Patient       Patient will benefit from skilled therapeutic intervention in order to improve the following deficits and impairments:  Decreased coordination, Decreased range of motion, Increased fascial restricitons, Pain, Decreased activity tolerance, Decreased strength, Decreased mobility, Increased muscle spasms  Visit Diagnosis: Cramp and spasm  Coccyx pain  Chronic bilateral low back pain with left-sided sciatica  Muscle weakness (generalized)     Problem List Patient Active Problem List   Diagnosis Date Noted  . Symptomatic cholelithiasis 06/13/2018  . Postpartum care and examination 04/29/2018  . Urine frequency 04/29/2018  . Normal labor 03/18/2018  . 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 BMI 30-39.9 09/21/2017  . Obesity in pregnancy 09/21/2017  . Language barrier 09/21/2017  . Supervision of high risk pregnancy, antepartum 08/24/2017  . AMA (advanced maternal age) multigravida 35+ 08/24/2017  . HYPERTRIGLYCERIDEMIA 12/15/2006   Lachlan Pelto C. Hudson Majkowski PT, DPT 10/04/19 5:16 PM   [facility] [address] [place], [place], [address] Phone: [phone]   Fax:  [phone]  Name: Kami Maulden MRN: [id number] Date of Birth: June 27, 1980

## 2019-10-06 ENCOUNTER — Other Ambulatory Visit: Payer: Self-pay

## 2019-10-06 ENCOUNTER — Encounter: Payer: Self-pay | Admitting: Physical Therapy

## 2019-10-06 ENCOUNTER — Ambulatory Visit: Payer: Self-pay | Admitting: Physical Therapy

## 2019-10-06 ENCOUNTER — Ambulatory Visit (HOSPITAL_COMMUNITY)
Admission: EM | Admit: 2019-10-06 | Discharge: 2019-10-06 | Disposition: A | Discharge status: Home / Self Care | Payer: Self-pay

## 2019-10-06 DIAGNOSIS — M533 Sacrococcygeal disorders, not elsewhere classified: Secondary | ICD-10-CM

## 2019-10-06 DIAGNOSIS — R252 Cramp and spasm: Secondary | ICD-10-CM

## 2019-10-06 NOTE — Therapy (Signed)
[place], [place], [address] Phone: [phone]   Fax:  [phone]  Physical Therapy Treatment  Patient Details  Name: Kari Ortega MRN: [id number] Date of Birth: 1981-04-23 Referring Provider (PT): Dr. Eunice Ortega   Encounter Date: 10/06/2019  PT End of Session - 10/06/19 1511    Visit Number  12    Number of Visits  18    Date for PT Re-Evaluation  11/03/19    Authorization Type  CAFA    PT Start Time  1504    PT Stop Time  1539    PT Time Calculation (min)  35 min    Activity Tolerance  Patient tolerated treatment well    Behavior During Therapy  [facility] for tasks assessed/performed       Past Medical History:  Diagnosis Date  . Anemia   . 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  Unable to determine reason per pt.    . Irregular periods     Past Surgical History:  Procedure Laterality Date  . CHOLECYSTECTOMY N/A 06/14/2018   Procedure: LAPAROSCOPIC CHOLECYSTECTOMY;  Surgeon: Ortega, Kari Bruce, MD;  Location: [place];  Service: General;  Laterality: N/A;  . TOOTH EXTRACTION      There were no vitals filed for this visit.  Subjective Assessment - 10/06/19 1505    Subjective  Pain is over here (pointing to Lt butock)   No change in pain. Every time I sit down it hurts. I do not feel pain in the leg right now. exercise from last time made the pain in my legs go away. Husband massages my leg with rolling pin and I do my exercises every day.    Patient Stated Goals  feel better    Currently in Pain?  Yes    Pain Score  3     Pain Location  Buttocks    Pain Orientation  Left    Pain Descriptors / Indicators  --   unable to describe other than "pain"   Aggravating Factors   sitting down                        [facility] Adult PT Treatment/Exercise - 10/06/19 0001      Knee/Hip Exercises: Stretches   Passive Hamstring Stretch Limitations   supine with strap    ITB Stretch Limitations  supine with strap    Piriformis Stretch Limitations  hooklying pull across & seated    Other Knee/Hip Stretches  deep squat with knee press out with elbows      Manual Therapy   Soft tissue mobilization  Lt gluts- below piriformis coccygeus             PT Education - 10/06/19 1540    Education Details  pelvic floor musculature, extended time for subjective today to educate on types of pain    Person(s) Educated  Patient    Methods  Explanation    Comprehension  Verbalized understanding;Need further instruction       PT Short Term Goals - 09/29/19 1542      PT SHORT TERM GOAL #1   Title  Pt will be independent in her HEP.    Status  Achieved      PT SHORT TERM GOAL #2   Title  able to sit with pain decreased >/= 25% in lumbar and coccyx    Baseline  a little better  Status  Achieved      PT SHORT TERM GOAL #3   Title  understand correct body mechanics with daily tasks to reduce strain on her back        PT Long Term Goals - 09/29/19 1543      PT LONG TERM GOAL #1   Title  Independent with advanced HEP and how to progress herself    Status  On-going      PT LONG TERM GOAL #2   Title  able to sit and stand with minimal pain due to pelvis and coccyx in correct alignment    Baseline  depends on the chair and how she is sitting    Status  On-going      PT LONG TERM GOAL #3   Title  Pt will be able to report minimal to no pain while sitting >/= 30 minutes.    Baseline  but > 30 min has pain in back    Status  Achieved      PT LONG TERM GOAL #4   Title  Pt will be able to amb 1/2 mile on community surfaces with minimal  pain reported.    Status  On-going      PT LONG TERM GOAL #5   Title  Pt will be able to bend and lift objects (10 pounds) off the floor with correct body mechanics and no pain reported.    Status  On-going            Plan - 10/06/19 1541    Clinical Impression Statement  pt reported a  decrease in cocordant pain in seated following coccygeus STM. Added deep squat to HEP for stretching and advanced HS & ITB to use of strap.    PT Treatment/Interventions  Biofeedback;Cryotherapy;Electrical Stimulation;Therapeutic activities;Therapeutic exercise;Neuromuscular re-education;Manual techniques;Patient/family education;Dry needling;Spinal Manipulations;Joint Manipulations    PT Next Visit Plan  outcome off squat stretch?    PT Home Exercise Plan  Access Code: [email or web address], added supine march instead of ball pass (too challenging)    Consulted and Agree with Plan of Care  Patient       Patient will benefit from skilled therapeutic intervention in order to improve the following deficits and impairments:  Decreased coordination, Decreased range of motion, Increased fascial restricitons, Pain, Decreased activity tolerance, Decreased strength, Decreased mobility, Increased muscle spasms  Visit Diagnosis: Cramp and spasm  Coccyx pain     Problem List Patient Active Problem List   Diagnosis Date Noted  . Symptomatic cholelithiasis 06/13/2018  . Postpartum care and examination 04/29/2018  . Urine frequency 04/29/2018  . Normal labor 03/18/2018  . 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 BMI 30-39.9 09/21/2017  . Obesity in pregnancy 09/21/2017  . Language barrier 09/21/2017  . Supervision of high risk pregnancy, antepartum 08/24/2017  . AMA (advanced maternal age) multigravida 35+ 08/24/2017  . HYPERTRIGLYCERIDEMIA 12/15/2006    Kari Rebert C. Castin Ortega PT, DPT 10/06/19 3:43 PM   [place] [facility] [address] [place], [place], [address] Phone: [phone]   Fax:  [phone]  Name: Kari Ortega MRN: [id number] Date of Birth: 06/01/1980

## 2019-10-07 ENCOUNTER — Ambulatory Visit: Payer: Self-pay | Admitting: Family

## 2019-10-11 ENCOUNTER — Ambulatory Visit: Payer: Self-pay | Attending: Family Medicine | Admitting: Physical Therapy

## 2019-10-11 ENCOUNTER — Telehealth: Payer: Self-pay | Admitting: Physical Therapy

## 2019-10-11 DIAGNOSIS — M6281 Muscle weakness (generalized): Secondary | ICD-10-CM | POA: Insufficient documentation

## 2019-10-11 DIAGNOSIS — G8929 Other chronic pain: Secondary | ICD-10-CM | POA: Insufficient documentation

## 2019-10-11 DIAGNOSIS — R252 Cramp and spasm: Secondary | ICD-10-CM | POA: Insufficient documentation

## 2019-10-11 DIAGNOSIS — M5442 Lumbago with sciatica, left side: Secondary | ICD-10-CM | POA: Insufficient documentation

## 2019-10-11 DIAGNOSIS — M533 Sacrococcygeal disorders, not elsewhere classified: Secondary | ICD-10-CM | POA: Insufficient documentation

## 2019-10-11 NOTE — Telephone Encounter (Signed)
LVM regarding no show via interpreter and advised of next appointment time. Launa Goedken C. Casimir Barcellos PT, DPT 10/11/19 4:46 PM

## 2019-10-13 ENCOUNTER — Other Ambulatory Visit: Payer: Self-pay

## 2019-10-13 ENCOUNTER — Ambulatory Visit: Payer: Self-pay | Admitting: Physical Therapy

## 2019-10-13 ENCOUNTER — Encounter: Payer: Self-pay | Admitting: Physical Therapy

## 2019-10-13 DIAGNOSIS — M533 Sacrococcygeal disorders, not elsewhere classified: Secondary | ICD-10-CM

## 2019-10-13 DIAGNOSIS — R252 Cramp and spasm: Secondary | ICD-10-CM

## 2019-10-13 NOTE — Therapy (Signed)
[place], [place], [address] Phone: [phone]   Fax:  [phone]  Physical Therapy Treatment  Patient Details  Name: Kari Ortega MRN: [id number] Date of Birth: [birth year] Referring Provider (PT): Dr. Eunice Blase   Encounter Date: 10/13/2019  PT End of Session - 10/13/19 1552    Visit Number  13    Number of Visits  18    Date for PT Re-Evaluation  11/03/19    Authorization Type  CAFA    PT Start Time  1550   5 min not billed waiting for interpreter   PT Stop Time  1627    PT Time Calculation (min)  37 min    Activity Tolerance  Patient tolerated treatment well    Behavior During Therapy  [facility] for tasks assessed/performed       Past Medical History:  Diagnosis Date  . Anemia   . 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  Unable to determine reason per pt.    . Irregular periods     Past Surgical History:  Procedure Laterality Date  . CHOLECYSTECTOMY N/A 06/14/2018   Procedure: LAPAROSCOPIC CHOLECYSTECTOMY;  Surgeon: [name], [name], MD;  Location: [place];  Service: General;  Laterality: N/A;  . TOOTH EXTRACTION      There were no vitals filed for this visit.  Subjective Assessment - 10/13/19 1553    Subjective  Reports a little pain in coccyx region. cleaned the house and now has more pain in Lt SIJ.    Patient Stated Goals  feel better    Currently in Pain?  Yes    Pain Score  5     Pain Location  Back   SIJ   Pain Orientation  Left;Lower    Pain Descriptors / Indicators  --   pain   Aggravating Factors   house chores                        [facility] Adult PT Treatment/Exercise - 10/13/19 0001      Lumbar Exercises: Stretches   Lower Trunk Rotation Limitations  x10      Knee/Hip Exercises: Aerobic   Tread Mill  1.9 at L10 incline      Knee/Hip Exercises: Standing   Other Standing Knee Exercises  squat to tap table       Knee/Hip Exercises: Supine   Other Supine Knee/Hip Exercises  iso ab set with physioball press      Manual Therapy   Soft tissue mobilization  Lt gluts & piriformis, thoracolumbar paraspinals             PT Education - 10/13/19 1641    Education Details  heel lift    Person(s) Educated  Patient    Methods  Explanation    Comprehension  Verbalized understanding;Need further instruction       PT Short Term Goals - 09/29/19 1542      PT SHORT TERM GOAL #1   Title  Pt will be independent in her HEP.    Status  Achieved      PT SHORT TERM GOAL #2   Title  able to sit with pain decreased >/= 25% in lumbar and coccyx    Baseline  a little better    Status  Achieved      PT SHORT TERM GOAL #3   Title  understand correct body mechanics with daily tasks  to reduce strain on her back        PT Long Term Goals - 09/29/19 1543      PT LONG TERM GOAL #1   Title  Independent with advanced HEP and how to progress herself    Status  On-going      PT LONG TERM GOAL #2   Title  able to sit and stand with minimal pain due to pelvis and coccyx in correct alignment    Baseline  depends on the chair and how she is sitting    Status  On-going      PT LONG TERM GOAL #3   Title  Pt will be able to report minimal to no pain while sitting >/= 30 minutes.    Baseline  but > 30 min has pain in back    Status  Achieved      PT LONG TERM GOAL #4   Title  Pt will be able to amb 1/2 mile on community surfaces with minimal  pain reported.    Status  On-going      PT LONG TERM GOAL #5   Title  Pt will be able to bend and lift objects (10 pounds) off the floor with correct body mechanics and no pain reported.    Status  On-going            Plan - 10/13/19 1636    Clinical Impression Statement  Pt had incr pain at Lt SIJ today after cleaning her house, we talked about alternating feet stepping forward while vacuuming. After pelvic rotation was corrected, noted 1/2 CM leg length lackin  in Rt vs Lt. Single layer heel lift was added to Rt shoe and I asked her to wear it until her next appointment. Interpreter did not answer via [employer] video call so after 5 min we proceeded, I am very familiar with this patient and she felt comfortable with our ability to communicate.    PT Treatment/Interventions  Biofeedback;Cryotherapy;Electrical Stimulation;Therapeutic activities;Therapeutic exercise;Neuromuscular re-education;Manual techniques;Patient/family education;Dry needling;Spinal Manipulations;Joint Manipulations    PT Next Visit Plan  outcome of heel lift?    PT Home Exercise Plan  Access Code: [email or web address], added supine march instead of ball pass (too challenging)    Consulted and Agree with Plan of Care  Patient       Patient will benefit from skilled therapeutic intervention in order to improve the following deficits and impairments:  Decreased coordination, Decreased range of motion, Increased fascial restricitons, Pain, Decreased activity tolerance, Decreased strength, Decreased mobility, Increased muscle spasms  Visit Diagnosis: Cramp and spasm  Coccyx pain     Problem List Patient Active Problem List   Diagnosis Date Noted  . Symptomatic cholelithiasis 06/13/2018  . Postpartum care and examination 04/29/2018  . Urine frequency 04/29/2018  . Normal labor 03/18/2018  . 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 BMI 30-39.9 09/21/2017  . Obesity in pregnancy 09/21/2017  . Language barrier 09/21/2017  . Supervision of high risk pregnancy, antepartum 08/24/2017  . AMA (advanced maternal age) multigravida 35+ 08/24/2017  . HYPERTRIGLYCERIDEMIA 12/15/2006    Zeya Balles C. Tinia Oravec PT, DPT 10/13/19 4:41 PM   [place] [facility] [address] [place], [place], [address] Phone: [phone]   Fax:  [phone]  Name: Kari Ortega MRN: [id number] Date of Birth: [birth year]

## 2019-10-14 ENCOUNTER — Other Ambulatory Visit: Payer: Self-pay

## 2019-10-14 ENCOUNTER — Encounter: Payer: Self-pay | Admitting: Family

## 2019-10-14 ENCOUNTER — Ambulatory Visit: Payer: Self-pay | Attending: Family | Admitting: Family

## 2019-10-14 VITALS — BP 106/69 | HR 65 | Temp 97.5°F | Resp 16 | Wt 177.0 lb

## 2019-10-14 DIAGNOSIS — R21 Rash and other nonspecific skin eruption: Secondary | ICD-10-CM

## 2019-10-14 DIAGNOSIS — G5603 Carpal tunnel syndrome, bilateral upper limbs: Secondary | ICD-10-CM

## 2019-10-14 DIAGNOSIS — Z789 Other specified health status: Secondary | ICD-10-CM

## 2019-10-14 NOTE — Patient Instructions (Addendum)
Referral to hand surgery.   [place].     Carpal Tunnel Syndrome  Carpal tunnel syndrome is a condition that causes pain in your hand and arm. The carpal tunnel is a narrow area that is on the palm side of your wrist. Repeated wrist motion or certain diseases may cause swelling in the tunnel. This swelling can pinch the main nerve in the wrist (median nerve). What are the causes? This condition may be caused by:  Repeated wrist motions.  Wrist injuries.  Arthritis.  A sac of fluid (cyst) or abnormal growth (tumor) in the carpal tunnel.  Fluid buildup during pregnancy. Sometimes the cause is not known. What increases the risk? The following factors may make you more likely to develop this condition:  Having a job in which you move your wrist in the same way many times. This includes jobs like being a Software engineer or a Scientist, water quality.  Being a woman.  Having other health conditions, such as: ? Diabetes. ? Obesity. ? A thyroid gland that is not active enough (hypothyroidism). ? Kidney failure. What are the signs or symptoms? Symptoms of this condition include:  A tingling feeling in your fingers.  Tingling or a loss of feeling (numbness) in your hand.  Pain in your entire arm. This pain may get worse when you bend your wrist and elbow for a long time.  Pain in your wrist that goes up your arm to your shoulder.  Pain that goes down into your palm or fingers.  A weak feeling in your hands. You may find it hard to grab and hold items. You may feel worse at night. How is this diagnosed? This condition is diagnosed with a medical history and physical exam. You may also have tests, such as:  Electromyogram (EMG). This test checks the signals that the nerves send to the muscles.  Nerve conduction study. This test checks how well signals pass through your nerves.  Imaging tests, such as X-rays, ultrasound, and MRI. These tests check for what might be the cause of your  condition. How is this treated? This condition may be treated with:  Lifestyle changes. You will be asked to stop or change the activity that caused your problem.  Doing exercise and activities that make bones and muscles stronger (physical therapy).  Learning how to use your hand again (occupational therapy).  Medicines for pain and swelling (inflammation). You may have injections in your wrist.  A wrist splint.  Surgery. Follow these instructions at home: If you have a splint:  Wear the splint as told by your doctor. Remove it only as told by your doctor.  Loosen the splint if your fingers: ? Tingle. ? Lose feeling (become numb). ? Turn cold and blue.  Keep the splint clean.  If the splint is not waterproof: ? Do not let it get wet. ? Cover it with a watertight covering when you take a bath or a shower. Managing pain, stiffness, and swelling   If told, put ice on the painful area: ? If you have a removable splint, remove it as told by your doctor. ? Put ice in a plastic bag. ? Place a towel between your skin and the bag. ? Leave the ice on for 20 minutes, 2-3 times per day. General instructions  Take over-the-counter and prescription medicines only as told by your doctor.  Rest your wrist from any activity that may cause pain. If needed, talk with your boss at work about changes  that can help your wrist heal.  Do any exercises as told by your doctor, physical therapist, or occupational therapist.  Keep all follow-up visits as told by your doctor. This is important. Contact a doctor if:  You have new symptoms.  Medicine does not help your pain.  Your symptoms get worse. Get help right away if:  You have very bad numbness or tingling in your wrist or hand. Summary  Carpal tunnel syndrome is a condition that causes pain in your hand and arm.  It is often caused by repeated wrist motions.  Lifestyle changes and medicines are used to treat this problem.  Surgery may help in very bad cases.  Follow your doctor's instructions about wearing a splint, resting your wrist, keeping follow-up visits, and calling for help. This information is not intended to replace advice given to you by your health care provider. Make sure you discuss any questions you have with your health care provider. Document Revised: 09/04/2017 Document Reviewed: 09/04/2017 Elsevier Patient Education  [place].

## 2019-10-14 NOTE — Progress Notes (Signed)
Patient ID: Kari Ortega, female    DOB: 1980/06/29  MRN: [id number]  CC: HAND NUMBNESS   Subjective: Kari Ortega is a 39 y.o. female with history of hypertriglyceridemia who presents for hand numbness.   1. NUMBNESS: Duration: months Onset: sudden Location: hands Bilateral: yes Symmetric: no  right hand is worse Decreased sensation: yes  Weakness: no Pain: yes 3/10 Quality:  dull Severity: 3/10  Frequency: constant Trauma: no Recent illness: no Diabetes: no Thyroid disease: no  HIV: no  Alcoholism: no  Spinal cord injury: no Alleviating factors: none  Aggravating factors: at night especially when laying on arm in bed to breastfeed her baby, mopping, sweeping, washing dishes Status: worse Treatments attempted: wrist splints make worse, Ibuprofen did not help   Patient Active Problem List   Diagnosis Date Noted  . Symptomatic cholelithiasis 06/13/2018  . Postpartum care and examination 04/29/2018  . Urine frequency 04/29/2018  . Normal labor 03/18/2018  . 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 BMI 30-39.9 09/21/2017  . Obesity in pregnancy 09/21/2017  . Language barrier 09/21/2017  . Supervision of high risk pregnancy, antepartum 08/24/2017  . AMA (advanced maternal age) multigravida 35+ 08/24/2017  . HYPERTRIGLYCERIDEMIA 12/15/2006     Current Outpatient Medications on File Prior to Visit  Medication Sig Dispense Refill  . acetaminophen (TYLENOL) 500 MG tablet Take 2 tablets (1,000 mg total) by mouth every 6 (six) hours as needed. 30 tablet 0  . Cholecalciferol (VITAMIN D-3) 125 MCG (5000 UT) TABS Take 1 tablet by mouth daily. 90 tablet 3  . diazepam (VALIUM) 5 MG tablet Take 1 by mouth 1 hour  pre-procedure with very light food. May bring 2nd tablet to appointment. 2 tablet 0  . ibuprofen (ADVIL) 600 MG tablet Take 1 tablet (600 mg total) by mouth every 8 (eight) hours as needed. 30 tablet 0  . meloxicam (MOBIC) 15 MG tablet Take 0.5-1  tablets (7.5-15 mg total) by mouth daily as needed for pain. 30 tablet 6  . Prenatal Vit-Fe Fumarate-FA (MULTIVITAMIN-PRENATAL) 27-0.8 MG TABS tablet Take 1 tablet by mouth daily at 12 noon.     Current Facility-Administered Medications on File Prior to Visit  Medication Dose Route Frequency Provider Last Rate Last Admin  . polyethylene glycol powder (GLYCOLAX/MIRALAX) container 255 g  1 Container Oral Once Rasch, Artist Pais, NP        No Known Allergies  Social History   Socioeconomic History  . Marital status: Married    Spouse name: Not on file  . Number of children: Not on file  . Years of education: Not on file  . Highest education level: Not on file  Occupational History  . Not on file  Tobacco Use  . Smoking status: Never Smoker  . Smokeless tobacco: Never Used  Substance and Sexual Activity  . Alcohol use: No  . Drug use: No  . Sexual activity: Yes    Birth control/protection: None  Other Topics Concern  . Not on file  Social History Narrative  . Not on file   Social Determinants of Health   Financial Resource Strain:   . Difficulty of Paying Living Expenses:   Food Insecurity:   . Worried About Charity fundraiser in the Last Year:   . Arboriculturist in the Last Year:   Transportation Needs:   . Film/video editor (Medical):   Marland Kitchen Lack of Transportation (Non-Medical):   Physical Activity:   . Days of Exercise  per Week:   . Minutes of Exercise per Session:   Stress:   . Feeling of Stress :   Social Connections:   . Frequency of Communication with Friends and Family:   . Frequency of Social Gatherings with Friends and Family:   . Attends Religious Services:   . Active Member of Clubs or Organizations:   . Attends Archivist Meetings:   Marland Kitchen Marital Status:   Intimate Partner Violence:   . Fear of Current or Ex-Partner:   . Emotionally Abused:   Marland Kitchen Physically Abused:   . Sexually Abused:     Family History  Problem Relation Age of Onset    . Diabetes Mother     Past Surgical History:  Procedure Laterality Date  . CHOLECYSTECTOMY N/A 06/14/2018   Procedure: LAPAROSCOPIC CHOLECYSTECTOMY;  Surgeon: [name], [name], MD;  Location: [facility];  Service: General;  Laterality: N/A;  . TOOTH EXTRACTION      ROS: Review of Systems Negative except as stated above  PHYSICAL EXAM: Vitals with BMI 10/14/2019 08/02/2019 07/29/2018  Height - 4' 10.268" 4\' 9"   Weight 177 lbs 165 lbs 160 lbs  BMI - 73.71 06.26  Systolic 948 546 270  Diastolic 69 70 72  Pulse 65 84 72  SpO2- 97%, room air  Temperature- 97.12F, oral  Physical Exam General appearance - alert, well appearing, and in no distress and oriented to person, place, and time Mental status - alert, oriented to person, place, and time, normal mood, behavior, speech, dress, motor activity, and thought processes Neck - supple, no significant adenopathy Lymphatics - no palpable lymphadenopathy, no hepatosplenomegaly Chest - clear to auscultation, no wheezes, rales or rhonchi, symmetric air entry, no tachypnea, retractions or cyanosis Heart - normal rate, regular rhythm, normal S1, S2, no murmurs, rubs, clicks or gallops Neurological - alert, oriented, normal speech, no focal findings or movement disorder noted, neck supple without rigidity, cranial nerves II through XII intact, DTR's normal and symmetric, motor and sensory grossly normal bilaterally, normal muscle tone, no tremors, strength 5/5, Romberg sign negative, normal gait and station Musculoskeletal - no joint tenderness, deformity or swelling, no muscular tenderness noted Extremities - peripheral pulses normal, no pedal edema, no clubbing or cyanosis . ASSESSMENT AND PLAN: 1. Bilateral carpal tunnel syndrome: -Seems patient has bilateral carpal tunnel syndrome. Patient is a housewife. Patient reports hand numbness is worse with mopping, sweeping, washing dishes, and while laying on her arm in bed to breastfeed her baby.   -Patient last visit on 09/12/2019 with me for the same issue. At that time counseled patient to try wrist splints at night and as needed during the day and to avoid aggravation through hyperextension of the wrist. Vitamin B12 was obtained and returned higher than normal therefore vitamin B12 supplement was not needed.  -Today reports wrist splints make the numbness worse and Ibuprofen does not help. Reports the numbness is unbearable at this point.  -Counseled patient that the symptoms of carpal tunnel may be ongoing and chronic in some cases.  -Referral to Hand Surgery for further evaluation. - Ambulatory referral to Hand Surgery  2. Language barrier: English as a second language teacher participated during this visit.  -Interpreter Name: Wilhemena Durie, ID#: [id number]  Patient was given the opportunity to ask questions.  Patient verbalized understanding of the plan and was able to repeat key elements of the plan. Patient was given clear instructions to go to Emergency Department or return to medical center if symptoms don't improve, worsen, or new problems  develop.The patient verbalized understanding.   Camillia Herter, NP

## 2019-10-17 ENCOUNTER — Telehealth: Payer: Self-pay | Admitting: Family Medicine

## 2019-10-17 NOTE — Telephone Encounter (Signed)
Patient came in requesting to know that name of the medication that was going to be prescribed to her for her rash. Patient states that the provider was going to send over an Rx to the [facility] pharmacy.

## 2019-10-18 ENCOUNTER — Ambulatory Visit: Payer: Self-pay | Admitting: Physical Therapy

## 2019-10-18 ENCOUNTER — Encounter: Payer: Self-pay | Admitting: Physical Therapy

## 2019-10-18 ENCOUNTER — Other Ambulatory Visit: Payer: Self-pay

## 2019-10-18 DIAGNOSIS — R252 Cramp and spasm: Secondary | ICD-10-CM

## 2019-10-18 DIAGNOSIS — M533 Sacrococcygeal disorders, not elsewhere classified: Secondary | ICD-10-CM

## 2019-10-18 MED ORDER — SUNSCREEN SPF50 EX LOTN: 1.0000 "application " | TOPICAL_LOTION | Freq: Three times a day (TID) | CUTANEOUS | 0 refills | Status: DC

## 2019-10-18 NOTE — Therapy (Signed)
[place], [place], [address] Phone: [phone]   Fax:  [phone]  Physical Therapy Treatment  Patient Details  Name: Kari Ortega MRN: [id number] Date of Birth: 02/24/1981 Referring Provider (PT): Dr. Eunice Blase   Encounter Date: 10/18/2019  PT End of Session - 10/18/19 1555    Visit Number  14    Number of Visits  18    Date for PT Re-Evaluation  11/03/19    Authorization Type  CAFA    PT Start Time  1551   pt arrived late   PT Stop Time  1626    PT Time Calculation (min)  35 min    Activity Tolerance  Patient tolerated treatment well    Behavior During Therapy  [facility] for tasks assessed/performed       Past Medical History:  Diagnosis Date  . Anemia   . 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  Unable to determine reason per pt.    . Irregular periods     Past Surgical History:  Procedure Laterality Date  . CHOLECYSTECTOMY N/A 06/14/2018   Procedure: LAPAROSCOPIC CHOLECYSTECTOMY;  Surgeon: [name], [name], MD;  Location: [facility];  Service: General;  Laterality: N/A;  . TOOTH EXTRACTION      There were no vitals filed for this visit.  Subjective Assessment - 10/18/19 1553    Subjective  I feel good. Only a little pain in my leg- no pain in coccyx. I have been wearing the heel lift.    Patient Stated Goals  feel better    Currently in Pain?  No/denies                        [facility] Adult PT Treatment/Exercise - 10/18/19 0001      Lumbar Exercises: Stretches   Active Hamstring Stretch  Left;Right;5 reps    Active Hamstring Stretch Limitations  2 sets    Figure 4 Stretch Limitations  supine pull across    Gastroc Stretch Limitations  standing lunge      Knee/Hip Exercises: Standing   Heel Raises  Both;20 reps;2 sets    Functional Squat Limitations  tap tables      Knee/Hip Exercises: Supine   Bridges with [employer]   Both;15 reps    Bridges with Clamshell  Both;10 reps   5 clams in each lift   Other Supine Knee/Hip Exercises  supine march with band around knees             PT Education - 10/18/19 1626    Education Details  heel lift & long term use    Person(s) Educated  Patient    Methods  Explanation;Handout    Comprehension  Verbalized understanding;Need further instruction       PT Short Term Goals - 09/29/19 1542      PT SHORT TERM GOAL #1   Title  Pt will be independent in her HEP.    Status  Achieved      PT SHORT TERM GOAL #2   Title  able to sit with pain decreased >/= 25% in lumbar and coccyx    Baseline  a little better    Status  Achieved      PT SHORT TERM GOAL #3   Title  understand correct body mechanics with daily tasks to reduce strain on her back        PT Long Term  Goals - 09/29/19 1543      PT LONG TERM GOAL #1   Title  Independent with advanced HEP and how to progress herself    Status  On-going      PT LONG TERM GOAL #2   Title  able to sit and stand with minimal pain due to pelvis and coccyx in correct alignment    Baseline  depends on the chair and how she is sitting    Status  On-going      PT LONG TERM GOAL #3   Title  Pt will be able to report minimal to no pain while sitting >/= 30 minutes.    Baseline  but > 30 min has pain in back    Status  Achieved      PT LONG TERM GOAL #4   Title  Pt will be able to amb 1/2 mile on community surfaces with minimal  pain reported.    Status  On-going      PT LONG TERM GOAL #5   Title  Pt will be able to bend and lift objects (10 pounds) off the floor with correct body mechanics and no pain reported.    Status  On-going            Plan - 10/18/19 1629    Clinical Impression Statement  Significant improvement with heel lift in shoe. Feels fatigue in legs which we discussed is normal. Added strength training to HEP. Encouraged her to continue wearing the lift for the next few weeks so her muscles  can "settle" into the new length tension relationship.    PT Treatment/Interventions  Biofeedback;Cryotherapy;Electrical Stimulation;Therapeutic activities;Therapeutic exercise;Neuromuscular re-education;Manual techniques;Patient/family education;Dry needling;Spinal Manipulations;Joint Manipulations    PT Home Exercise Plan  Access Code: [email or web address], added supine march instead of ball pass (too challenging)    Consulted and Agree with Plan of Care  Patient       Patient will benefit from skilled therapeutic intervention in order to improve the following deficits and impairments:  Decreased coordination, Decreased range of motion, Increased fascial restricitons, Pain, Decreased activity tolerance, Decreased strength, Decreased mobility, Increased muscle spasms  Visit Diagnosis: Cramp and spasm  Coccyx pain     Problem List Patient Active Problem List   Diagnosis Date Noted  . Symptomatic cholelithiasis 06/13/2018  . Postpartum care and examination 04/29/2018  . Urine frequency 04/29/2018  . Normal labor 03/18/2018  . 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 BMI 30-39.9 09/21/2017  . Obesity in pregnancy 09/21/2017  . Language barrier 09/21/2017  . Supervision of high risk pregnancy, antepartum 08/24/2017  . AMA (advanced maternal age) multigravida 35+ 08/24/2017  . HYPERTRIGLYCERIDEMIA 12/15/2006    Kinneth Fujiwara C. Tashawna Thom PT, DPT 10/18/19 4:34 PM   [facility] [place] [address] [place], [place], [address] Phone: [phone]   Fax:  [phone]  Name: Kari Ortega MRN: [id number] Date of Birth: 1980/09/28

## 2019-10-18 NOTE — Addendum Note (Signed)
Addended by: Camillia Herter on: 10/18/2019 08:08 AM   Modules accepted: Orders

## 2019-10-18 NOTE — Addendum Note (Signed)
Addended by: Camillia Herter on: 10/18/2019 08:07 AM   Modules accepted: Orders

## 2019-10-20 ENCOUNTER — Encounter: Payer: Self-pay | Admitting: Physical Therapy

## 2019-10-20 ENCOUNTER — Other Ambulatory Visit: Payer: Self-pay

## 2019-10-20 ENCOUNTER — Ambulatory Visit: Payer: Self-pay | Admitting: Physical Therapy

## 2019-10-20 DIAGNOSIS — M6281 Muscle weakness (generalized): Secondary | ICD-10-CM

## 2019-10-20 DIAGNOSIS — M533 Sacrococcygeal disorders, not elsewhere classified: Secondary | ICD-10-CM

## 2019-10-20 DIAGNOSIS — G8929 Other chronic pain: Secondary | ICD-10-CM

## 2019-10-20 DIAGNOSIS — R252 Cramp and spasm: Secondary | ICD-10-CM

## 2019-10-20 NOTE — Therapy (Signed)
[facility], [place], [address] Phone: [phone]   Fax:  [phone]  Physical Therapy Treatment  Patient Details  Name: Kari Ortega MRN: [id number] Date of Birth: Apr 25, 1981 Referring Provider (PT): Dr. Eunice Blase   Encounter Date: 10/20/2019   PT End of Session - 10/20/19 1543    Visit Number 15    Number of Visits 18    Date for PT Re-Evaluation 11/03/19    Authorization Type CAFA    PT Start Time 1540    PT Stop Time 1619    PT Time Calculation (min) 39 min    Activity Tolerance Patient tolerated treatment well    Behavior During Therapy [facility] for tasks assessed/performed           Past Medical History:  Diagnosis Date  . Anemia   . 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  Unable to determine reason per pt.    . Irregular periods     Past Surgical History:  Procedure Laterality Date  . CHOLECYSTECTOMY N/A 06/14/2018   Procedure: LAPAROSCOPIC CHOLECYSTECTOMY;  Surgeon: [name], [name], MD;  Location: [facility];  Service: General;  Laterality: N/A;  . TOOTH EXTRACTION      There were no vitals filed for this visit.   Subjective Assessment - 10/20/19 1541    Subjective I feel a little better. Just some discomfort in Lt lower back.    Patient Stated Goals feel better    Currently in Pain? Yes    Pain Score 3     Pain Location Back    Pain Orientation Lower;Left    Pain Descriptors / Indicators Aching    Aggravating Factors  standing    Pain Relieving Factors , rest                             [facility] Adult PT Treatment/Exercise - 10/20/19 0001      Lumbar Exercises: Stretches   Lower Trunk Rotation Limitations x10    Figure 4 Stretch Limitations supine pull across      Knee/Hip Exercises: Stretches   Passive Hamstring Stretch Limitations seated edge of table      Knee/Hip Exercises: Aerobic   Elliptical 5 min L7 UE & LE  following DN      Knee/Hip Exercises: Standing   Heel Raises 20 reps;Both    Heel Raises Limitations ball bw knees    Functional Squat Limitations tap chair      Manual Therapy   Manual therapy comments skilled palpation and monitoring during TPDN    Soft tissue mobilization Lt glut max-superior fibers            Trigger Point Dry Needling - 10/20/19 0001    Muscles Treated Back/Hip Gluteus maximus    Gluteus Maximus Response Twitch response elicited;Palpable increased muscle length   left               PT Education - 10/20/19 1605    Education Details what is a trigger point, rationale for her pain    Person(s) Educated Patient    Methods Explanation    Comprehension Verbalized understanding;Need further instruction            PT Short Term Goals - 09/29/19 1542      PT SHORT TERM GOAL #1   Title Pt will be independent in her HEP.    Status Achieved  PT SHORT TERM GOAL #2   Title able to sit with pain decreased >/= 25% in lumbar and coccyx    Baseline a little better    Status Achieved      PT SHORT TERM GOAL #3   Title understand correct body mechanics with daily tasks to reduce strain on her back             PT Long Term Goals - 09/29/19 1543      PT LONG TERM GOAL #1   Title Independent with advanced HEP and how to progress herself    Status On-going      PT LONG TERM GOAL #2   Title able to sit and stand with minimal pain due to pelvis and coccyx in correct alignment    Baseline depends on the chair and how she is sitting    Status On-going      PT LONG TERM GOAL #3   Title Pt will be able to report minimal to no pain while sitting >/= 30 minutes.    Baseline but > 30 min has pain in back    Status Achieved      PT LONG TERM GOAL #4   Title Pt will be able to amb 1/2 mile on community surfaces with minimal  pain reported.    Status On-going      PT LONG TERM GOAL #5   Title Pt will be able to bend and lift objects (10 pounds) off  the floor with correct body mechanics and no pain reported.    Status On-going                 Plan - 10/20/19 1604    Clinical Impression Statement Tighness and trigger points palpated in superior fibers of glut max recreated concordant pain and was reduced with DN. pt verbalized soreness as expected.    PT Treatment/Interventions Biofeedback;Cryotherapy;Electrical Stimulation;Therapeutic activities;Therapeutic exercise;Neuromuscular re-education;Manual techniques;Patient/family education;Dry needling;Spinal Manipulations;Joint Manipulations    PT Next Visit Plan outcome of dry needling? core strengthening    PT Home Exercise Plan Access Code: [email or web address], added supine march instead of ball pass (too challenging)    Consulted and Agree with Plan of Care Patient           Patient will benefit from skilled therapeutic intervention in order to improve the following deficits and impairments:  Decreased coordination, Decreased range of motion, Increased fascial restricitons, Pain, Decreased activity tolerance, Decreased strength, Decreased mobility, Increased muscle spasms  Visit Diagnosis: Cramp and spasm  Coccyx pain  Muscle weakness (generalized)  Chronic bilateral low back pain with left-sided sciatica     Problem List Patient Active Problem List   Diagnosis Date Noted  . Symptomatic cholelithiasis 06/13/2018  . Postpartum care and examination 04/29/2018  . Urine frequency 04/29/2018  . Normal labor 03/18/2018  . 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 BMI 30-39.9 09/21/2017  . Obesity in pregnancy 09/21/2017  . Language barrier 09/21/2017  . Supervision of high risk pregnancy, antepartum 08/24/2017  . AMA (advanced maternal age) multigravida 35+ 08/24/2017  . HYPERTRIGLYCERIDEMIA 12/15/2006    Dray Dente C. Engelbert Sevin PT, DPT 10/20/19 4:33 PM   [facility] [address] [place], [place], [address] Phone: [phone]    Fax:  [phone]  Name: Kari Ortega MRN: [id number] Date of Birth: Oct 12, 1980

## 2019-10-25 ENCOUNTER — Other Ambulatory Visit: Payer: Self-pay

## 2019-10-25 ENCOUNTER — Ambulatory Visit: Payer: Self-pay | Admitting: Physical Therapy

## 2019-10-25 DIAGNOSIS — R252 Cramp and spasm: Secondary | ICD-10-CM

## 2019-10-25 DIAGNOSIS — M5442 Lumbago with sciatica, left side: Secondary | ICD-10-CM

## 2019-10-25 DIAGNOSIS — M533 Sacrococcygeal disorders, not elsewhere classified: Secondary | ICD-10-CM

## 2019-10-25 DIAGNOSIS — M6281 Muscle weakness (generalized): Secondary | ICD-10-CM

## 2019-10-25 NOTE — Therapy (Signed)
[place], [place], [address] Phone: [phone]   Fax:  [phone]  Physical Therapy Treatment  Patient Details  Name: Kari Ortega MRN: [id number] Date of Birth: 1980-11-12 Referring Provider (PT): Dr. Eunice Blase   Encounter Date: 10/25/2019   PT End of Session - 10/25/19 1631    Visit Number 16    Number of Visits 18    Date for PT Re-Evaluation 11/03/19    Authorization Type CAFA    PT Start Time 1620    PT Stop Time 1708    PT Time Calculation (min) 48 min    Activity Tolerance Patient tolerated treatment well           Past Medical History:  Diagnosis Date  . Anemia   . 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  Unable to determine reason per pt.    . Irregular periods     Past Surgical History:  Procedure Laterality Date  . CHOLECYSTECTOMY N/A 06/14/2018   Procedure: LAPAROSCOPIC CHOLECYSTECTOMY;  Surgeon: [name], [name], MD;  Location: [facility];  Service: General;  Laterality: N/A;  . TOOTH EXTRACTION      There were no vitals filed for this visit.   Subjective Assessment - 10/25/19 1630    Subjective Having a little pain. Waist L side.    Currently in Pain? Yes    Pain Location Back             [facility] Adult PT Treatment/Exercise - 10/25/19 0001      Lumbar Exercises: Stretches   Active Hamstring Stretch 2 reps;30 seconds    Lower Trunk Rotation Limitations x3 , 30 sec  knees crossed       Lumbar Exercises: Aerobic   Nustep 5 min LE and UE L5 warm up       Lumbar Exercises: Standing   Functional Squats 15 reps    Row Limitations 1 plate x 15     Shoulder Extension Limitations 1 plate x 15     Other Standing Lumbar Exercises Palloff press 1 plate on Freemotion added oblique twist x 10     Other Standing Lumbar Exercises push/pull Freemotion bilateral UEs x 10 each direction       Knee/Hip Exercises: Seated   Sit to Sand 10  reps   with dumbbell press forward, cues to avoid arching      Moist Heat Therapy   Number Minutes Moist Heat 10 Minutes   sidelying    Moist Heat Location Lumbar Spine                    PT Short Term Goals - 09/29/19 1542      PT SHORT TERM GOAL #1   Title Pt will be independent in her HEP.    Status Achieved      PT SHORT TERM GOAL #2   Title able to sit with pain decreased >/= 25% in lumbar and coccyx    Baseline a little better    Status Achieved      PT SHORT TERM GOAL #3   Title understand correct body mechanics with daily tasks to reduce strain on her back             PT Long Term Goals - 09/29/19 1543      PT LONG TERM GOAL #1   Title Independent with advanced HEP and how to progress herself    Status  On-going      PT LONG TERM GOAL #2   Title able to sit and stand with minimal pain due to pelvis and coccyx in correct alignment    Baseline depends on the chair and how she is sitting    Status On-going      PT LONG TERM GOAL #3   Title Pt will be able to report minimal to no pain while sitting >/= 30 minutes.    Baseline but > 30 min has pain in back    Status Achieved      PT LONG TERM GOAL #4   Title Pt will be able to amb 1/2 mile on community surfaces with minimal  pain reported.    Status On-going      PT LONG TERM GOAL #5   Title Pt will be able to bend and lift objects (10 pounds) off the floor with correct body mechanics and no pain reported.    Status On-going                 Plan - 10/25/19 1649    Clinical Impression Statement Min cues needed for standing core strength exercises.  Very tired but pain increased only a mild amount. Sees Dr. Erlinda Hong for another issue on Thursday (hands)?    PT Treatment/Interventions Biofeedback;Cryotherapy;Electrical Stimulation;Therapeutic activities;Therapeutic exercise;Neuromuscular re-education;Manual techniques;Patient/family education;Dry needling;Spinal Manipulations;Joint Manipulations    PT  Next Visit Plan cont core and prep for DC vs renew    PT Home Exercise Plan Access Code: [email or web address], added supine march instead of ball pass (too challenging)    Consulted and Agree with Plan of Care Patient           Patient will benefit from skilled therapeutic intervention in order to improve the following deficits and impairments:  Decreased coordination, Decreased range of motion, Increased fascial restricitons, Pain, Decreased activity tolerance, Decreased strength, Decreased mobility, Increased muscle spasms  Visit Diagnosis: Cramp and spasm  Coccyx pain  Muscle weakness (generalized)  Chronic bilateral low back pain with left-sided sciatica     Problem List Patient Active Problem List   Diagnosis Date Noted  . Symptomatic cholelithiasis 06/13/2018  . Postpartum care and examination 04/29/2018  . Urine frequency 04/29/2018  . Normal labor 03/18/2018  . 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 BMI 30-39.9 09/21/2017  . Obesity in pregnancy 09/21/2017  . Language barrier 09/21/2017  . Supervision of high risk pregnancy, antepartum 08/24/2017  . AMA (advanced maternal age) multigravida 35+ 08/24/2017  . HYPERTRIGLYCERIDEMIA 12/15/2006    Kari Ortega 10/25/2019, 5:03 PM  [facility] [address] [place], [place], [address] Phone: [phone]   Fax:  [phone]  Name: Kari Ortega MRN: [id number] Date of Birth: 16-Oct-1980  Raeford Razor, PT 10/25/19 5:04 PM Phone: [phone] Fax: [phone]

## 2019-10-26 ENCOUNTER — Encounter: Payer: Self-pay | Admitting: Physical Therapy

## 2019-10-26 ENCOUNTER — Ambulatory Visit: Payer: Self-pay | Admitting: Physical Therapy

## 2019-10-26 DIAGNOSIS — R252 Cramp and spasm: Secondary | ICD-10-CM

## 2019-10-26 DIAGNOSIS — M533 Sacrococcygeal disorders, not elsewhere classified: Secondary | ICD-10-CM

## 2019-10-26 DIAGNOSIS — M6281 Muscle weakness (generalized): Secondary | ICD-10-CM

## 2019-10-26 NOTE — Therapy (Signed)
[place], [place], [address] Phone: [phone]   Fax:  [phone]  Physical Therapy Treatment  Patient Details  Name: Kari Ortega MRN: [id number] Date of Birth: 02-27-1981 Referring Provider (PT): Dr. Eunice Blase   Encounter Date: 10/26/2019   PT End of Session - 10/26/19 1727    Visit Number 17    Number of Visits 18    Date for PT Re-Evaluation 11/03/19    Authorization Type CAFA    PT Start Time 1725   pt arrived late   PT Stop Time 1802    PT Time Calculation (min) 37 min    Activity Tolerance Patient tolerated treatment well    Behavior During Therapy [facility] for tasks assessed/performed           Past Medical History:  Diagnosis Date  . Anemia   . 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  Unable to determine reason per pt.    . Irregular periods     Past Surgical History:  Procedure Laterality Date  . CHOLECYSTECTOMY N/A 06/14/2018   Procedure: LAPAROSCOPIC CHOLECYSTECTOMY;  Surgeon: [name], [name], MD;  Location: [place];  Service: General;  Laterality: N/A;  . TOOTH EXTRACTION      There were no vitals filed for this visit.   Subjective Assessment - 10/26/19 1728    Subjective Just had a little pain in back, applied a hot pack that got rid of the pain. Just the smallest bit of pain today. Hot pack was very helpful.    Patient Stated Goals feel better    Currently in Pain? Yes    Pain Score 2     Pain Location Back    Pain Orientation Lower;Left    Pain Descriptors / Indicators Aching    Aggravating Factors  standing & walking    Pain Relieving Factors rest                             [facility] Adult PT Treatment/Exercise - 10/26/19 0001      Lumbar Exercises: Aerobic   Nustep 5 min L8 UE & Le      Moist Heat Therapy   Number Minutes Moist Heat 10 Minutes   5 min concurrent with education   Moist Heat Location  Lumbar Spine      Manual Therapy   Soft tissue mobilization Lt gluts, piriformis                  PT Education - 10/26/19 1803    Education Details wear of lift, POC, endurance exercise & importance of HEP    Person(s) Educated Patient    Methods Explanation    Comprehension Verbalized understanding;Need further instruction            PT Short Term Goals - 09/29/19 1542      PT SHORT TERM GOAL #1   Title Pt will be independent in her HEP.    Status Achieved      PT SHORT TERM GOAL #2   Title able to sit with pain decreased >/= 25% in lumbar and coccyx    Baseline a little better    Status Achieved      PT SHORT TERM GOAL #3   Title understand correct body mechanics with daily tasks to reduce strain on her back  PT Long Term Goals - 09/29/19 1543      PT LONG TERM GOAL #1   Title Independent with advanced HEP and how to progress herself    Status On-going      PT LONG TERM GOAL #2   Title able to sit and stand with minimal pain due to pelvis and coccyx in correct alignment    Baseline depends on the chair and how she is sitting    Status On-going      PT LONG TERM GOAL #3   Title Pt will be able to report minimal to no pain while sitting >/= 30 minutes.    Baseline but > 30 min has pain in back    Status Achieved      PT LONG TERM GOAL #4   Title Pt will be able to amb 1/2 mile on community surfaces with minimal  pain reported.    Status On-going      PT LONG TERM GOAL #5   Title Pt will be able to bend and lift objects (10 pounds) off the floor with correct body mechanics and no pain reported.    Status On-going                 Plan - 10/26/19 1759    Clinical Impression Statement Pt has done very well, continues to have some soreness in Right hip and I encouraged her to continue stretches and exercises but to also use low-impact exercises such as cycling to improve endurance. Next visit will be d/c.    PT Next Visit Plan  review HEP & d/c    PT Home Exercise Plan Access Code: [email or web address], added supine march instead of ball pass (too challenging)    Consulted and Agree with Plan of Care Patient           Patient will benefit from skilled therapeutic intervention in order to improve the following deficits and impairments:  Decreased coordination, Decreased range of motion, Increased fascial restricitons, Pain, Decreased activity tolerance, Decreased strength, Decreased mobility, Increased muscle spasms  Visit Diagnosis: Cramp and spasm  Coccyx pain  Muscle weakness (generalized)     Problem List Patient Active Problem List   Diagnosis Date Noted  . Symptomatic cholelithiasis 06/13/2018  . Postpartum care and examination 04/29/2018  . Urine frequency 04/29/2018  . Normal labor 03/18/2018  . 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 BMI 30-39.9 09/21/2017  . Obesity in pregnancy 09/21/2017  . Language barrier 09/21/2017  . Supervision of high risk pregnancy, antepartum 08/24/2017  . AMA (advanced maternal age) multigravida 35+ 08/24/2017  . HYPERTRIGLYCERIDEMIA 12/15/2006    Devarius Nelles C. Linday Rhodes PT, DPT 10/26/19 6:05 PM   [facility] [address] [place], [place], [address] Phone: [phone]   Fax:  [phone]  Name: Kari Ortega MRN: [id number] Date of Birth: 1981/04/22

## 2019-10-27 ENCOUNTER — Other Ambulatory Visit: Payer: Self-pay

## 2019-10-27 ENCOUNTER — Ambulatory Visit (INDEPENDENT_AMBULATORY_CARE_PROVIDER_SITE_OTHER): Payer: Self-pay | Admitting: Orthopaedic Surgery

## 2019-10-27 ENCOUNTER — Encounter: Payer: Self-pay | Admitting: Physical Therapy

## 2019-10-27 ENCOUNTER — Ambulatory Visit (INDEPENDENT_AMBULATORY_CARE_PROVIDER_SITE_OTHER): Payer: Self-pay

## 2019-10-27 DIAGNOSIS — R200 Anesthesia of skin: Secondary | ICD-10-CM

## 2019-10-27 NOTE — Progress Notes (Signed)
Office Visit Note   Patient: Kari Ortega           Date of Birth: 1981-02-02           MRN: [id number] Visit Date: 10/27/2019              Requested by: Camillia Herter, NP [address] [place],  [place] [address] PCP: Antony Blackbird, MD   Assessment & Plan: Visit Diagnoses:  1. Arm numbness     Plan: Impression is bilateral hand and arm numbness.  We will obtain nerve conduction studies to evaluate for carpal tunnel syndrome.  Follow-up after the nerve conduction studies.  Today's encounter was performed through an interpreter which increased the complexity of the visit.  Follow-Up Instructions: Return if symptoms worsen or fail to improve.   Orders:  Orders Placed This Encounter  Procedures  . XR Cervical Spine 2 or 3 views  . Ambulatory referral to Physical Medicine Rehab   No orders of the defined types were placed in this encounter.     Procedures: No procedures performed   Clinical Data: No additional findings.   Subjective: Chief Complaint  Patient presents with  . arm numbness    Patient comes in for evaluation of bilateral arm numbness.  She states that she has only occasional neck pain that has happened once to twice.  She feels that that the pain starts in her shoulders and goes down the arm.  She feels a constant numbness in both of her hands.  She has taken naproxen which has not been very helpful.  She is currently breast-feeding so she is concerned about taking additional medications.   Review of Systems  Constitutional: Negative.   HENT: Negative.   Eyes: Negative.   Respiratory: Negative.   Cardiovascular: Negative.   Endocrine: Negative.   Musculoskeletal: Negative.   Neurological: Negative.   Hematological: Negative.   Psychiatric/Behavioral: Negative.   All other systems reviewed and are negative.    Objective: Vital Signs: There were no vitals taken for this visit.  Physical Exam Vitals and nursing note reviewed.   Constitutional:      Appearance: She is well-developed.  HENT:     Head: Normocephalic and atraumatic.  Pulmonary:     Effort: Pulmonary effort is normal.  Abdominal:     Palpations: Abdomen is soft.  Musculoskeletal:     Cervical back: Neck supple.  Skin:    General: Skin is warm.     Capillary Refill: Capillary refill takes less than 2 seconds.  Neurological:     Mental Status: She is alert and oriented to person, place, and time.  Psychiatric:        Behavior: Behavior normal.        Thought Content: Thought content normal.        Judgment: Judgment normal.     Ortho Exam Bilateral hands show positive carpal tunnel compressive signs.  No focal motor or sensory deficits.  No muscle atrophy. Specialty Comments:  No specialty comments available.  Imaging: XR Cervical Spine 2 or 3 views  Result Date: 10/27/2019 Straightening of C spine.  No acute abnormalities.    PMFS History: Patient Active Problem List   Diagnosis Date Noted  . Symptomatic cholelithiasis 06/13/2018  . Postpartum care and examination 04/29/2018  . Urine frequency 04/29/2018  . Normal labor 03/18/2018  . 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 BMI 30-39.9 09/21/2017  . Obesity in pregnancy 09/21/2017  . Language barrier 09/21/2017  .  Supervision of high risk pregnancy, antepartum 08/24/2017  . AMA (advanced maternal age) multigravida 35+ 08/24/2017  . HYPERTRIGLYCERIDEMIA 12/15/2006   Past Medical History:  Diagnosis Date  . Anemia   . 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  Unable to determine reason per pt.    . Irregular periods     Family History  Problem Relation Age of Onset  . Diabetes Mother     Past Surgical History:  Procedure Laterality Date  . CHOLECYSTECTOMY N/A 06/14/2018   Procedure: LAPAROSCOPIC CHOLECYSTECTOMY;  Surgeon: [name], [name], MD;  Location: [facility];  Service: General;  Laterality: N/A;  . TOOTH EXTRACTION     Social History    Occupational History  . Not on file  Tobacco Use  . Smoking status: Never Smoker  . Smokeless tobacco: Never Used  Vaping Use  . Vaping Use: Never used  Substance and Sexual Activity  . Alcohol use: No  . Drug use: No  . Sexual activity: Yes    Birth control/protection: None

## 2019-10-31 ENCOUNTER — Encounter: Payer: Self-pay | Admitting: Physical Medicine & Rehabilitation

## 2019-11-01 ENCOUNTER — Ambulatory Visit: Payer: Self-pay | Admitting: Physical Therapy

## 2019-11-02 ENCOUNTER — Encounter: Payer: Self-pay | Admitting: Physical Therapy

## 2019-11-02 ENCOUNTER — Ambulatory Visit: Payer: Self-pay | Admitting: Physical Therapy

## 2019-11-02 ENCOUNTER — Other Ambulatory Visit: Payer: Self-pay

## 2019-11-02 DIAGNOSIS — M533 Sacrococcygeal disorders, not elsewhere classified: Secondary | ICD-10-CM

## 2019-11-02 DIAGNOSIS — M6281 Muscle weakness (generalized): Secondary | ICD-10-CM

## 2019-11-02 DIAGNOSIS — R252 Cramp and spasm: Secondary | ICD-10-CM

## 2019-11-02 NOTE — Therapy (Signed)
[place], [place], [address] Phone: [phone]   Fax:  [phone]  Physical Therapy Treatment/Discharge  Patient Details  Name: Kari Ortega MRN: [id number] Date of Birth: 05-20-1980 Referring Provider (PT): Dr. Eunice Blase   Encounter Date: 11/02/2019   PT End of Session - 11/02/19 1558    Visit Number 18    Number of Visits 18    Date for PT Re-Evaluation 11/03/19    PT Start Time 7001   pt arrived late   PT Stop Time 1640    PT Time Calculation (min) 42 min    Activity Tolerance Patient tolerated treatment well    Behavior During Therapy [facility] for tasks assessed/performed           Past Medical History:  Diagnosis Date  . Anemia   . 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  Unable to determine reason per pt.    . Irregular periods     Past Surgical History:  Procedure Laterality Date  . CHOLECYSTECTOMY N/A 06/14/2018   Procedure: LAPAROSCOPIC CHOLECYSTECTOMY;  Surgeon: [name], [name], MD;  Location: [place];  Service: General;  Laterality: N/A;  . TOOTH EXTRACTION      There were no vitals filed for this visit.   Subjective Assessment - 11/02/19 1559    Subjective Today I do not feel much of a pain, just a little in the foot and a little in the back. Compalins of Rt lateral ankle pain in inversion.              [facility] PT Assessment - 11/02/19 0001      Assessment   Medical Diagnosis Pain in the coccyx    Referring Provider (PT) Dr. Eunice Blase      Strength   Right Hip Flexion 4+/5    Right Hip ABduction 5/5    Left Hip Flexion 4+/5    Left Hip ABduction 5/5      Palpation   SI assessment  Decreased Lt upper quadrant mobility & decreased superior spring from Lt ILA- addressed wtih mobilizations today                                 PT Education - 11/02/19 1911    Education Details incr time due to  interpretation: heel lift, anatomy of condition, goals, POC, importance of continued HEP, returning to MD    Person(s) Educated Patient    Methods Explanation    Comprehension Verbalized understanding            PT Short Term Goals - 09/29/19 1542      PT SHORT TERM GOAL #1   Title Pt will be independent in her HEP.    Status Achieved      PT SHORT TERM GOAL #2   Title able to sit with pain decreased >/= 25% in lumbar and coccyx    Baseline a little better    Status Achieved      PT SHORT TERM GOAL #3   Title understand correct body mechanics with daily tasks to reduce strain on her back             PT Long Term Goals - 11/02/19 1625      PT LONG TERM GOAL #1   Title Independent with advanced HEP and how to progress herself    Status Achieved  PT LONG TERM GOAL #2   Title able to sit and stand with minimal pain due to pelvis and coccyx in correct alignment    Baseline it doesn't hurt so bad when I sit. sometimes it is really bad but right now it is not that bad.    Status Partially Met      PT LONG TERM GOAL #3   Title Pt will be able to report minimal to no pain while sitting >/= 30 minutes.    Status Achieved      PT LONG TERM GOAL #4   Title Pt will be able to amb 1/2 mile on community surfaces with minimal  pain reported.    Baseline can do that but I feel lik I cannot handle anything else    Status Partially Met      PT LONG TERM GOAL #5   Title Pt will be able to bend and lift objects (10 pounds) off the floor with correct body mechanics and no pain reported.    Baseline I can do it but I feel like I have a lot of pain    Status Not Met                 Plan - 11/02/19 1904    Clinical Impression Statement Pain reports were inconsistent today, pt arrived complaining of very little pain and then was describing whole leg pain. She says that it does occasionally become severe still but wearing single layer heel lift in Rt shoe has made her be able  to stand without fear of pain. Her strength has improved and she reports compliance with HEP. Continues to have tightness in piriformis and surrounding musculature that we can realease with DN and STM but does not appear to have long-lasting effects. Her Lt SIJ is very stiff compared to spring ability on the Rt and she cont to complain of bil SIJ pain but seems that the muscles around it are more sore. At this point, we have completed 18 visits of PT and pt cont to complain of similar SIJ and Lt leg pain. However, coccyx pain that she was originally referred for has been resolved. I would like for her to return to Dr Junius Roads to consider other treatment options such as possible SIJ injection but I advised that the final decision would be made between her and Dr Junius Roads. Pt verbalized understanding.    PT Treatment/Interventions Biofeedback;Cryotherapy;Electrical Stimulation;Therapeutic activities;Therapeutic exercise;Neuromuscular re-education;Manual techniques;Patient/family education;Dry needling;Spinal Manipulations;Joint Manipulations    PT Home Exercise Plan Access Code: [email or web address], added supine march instead of ball pass (too challenging)    Consulted and Agree with Plan of Care Patient           Patient will benefit from skilled therapeutic intervention in order to improve the following deficits and impairments:  Decreased coordination, Decreased range of motion, Increased fascial restricitons, Pain, Decreased activity tolerance, Decreased strength, Decreased mobility, Increased muscle spasms  Visit Diagnosis: Cramp and spasm  Coccyx pain  Muscle weakness (generalized)     Problem List Patient Active Problem List   Diagnosis Date Noted  . Symptomatic cholelithiasis 06/13/2018  . Postpartum care and examination 04/29/2018  . Urine frequency 04/29/2018  . Normal labor 03/18/2018  . 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 BMI 30-39.9 09/21/2017  . Obesity in pregnancy 09/21/2017  . Language  barrier 09/21/2017  . Supervision of high risk pregnancy, antepartum 08/24/2017  . AMA (advanced maternal age) multigravida 35+ 08/24/2017  . HYPERTRIGLYCERIDEMIA 12/15/2006  PHYSICAL THERAPY DISCHARGE SUMMARY  Visits from Start of Care: 18  Current functional level related to goals / functional outcomes: See above   Remaining deficits: See above   Education / Equipment: Anatomy of condition, POC, HEP, exercise form/rationale  Plan: Patient agrees to discharge.  Patient goals were partially met. Patient is being discharged due to lack of progress.  ?????      Malaiyah Achorn C. Joshawn Crissman PT, DPT 11/02/19 7:12 PM   [place], [place], [address] Phone: [phone]   Fax:  [phone]  Name: Kari Ortega MRN: [id number] Date of Birth: 1980-07-09

## 2019-11-03 ENCOUNTER — Ambulatory Visit: Payer: Self-pay | Admitting: Physical Therapy

## 2019-11-08 ENCOUNTER — Encounter: Payer: Self-pay | Admitting: Physical Therapy

## 2019-11-09 ENCOUNTER — Ambulatory Visit: Payer: Self-pay | Admitting: Orthopaedic Surgery

## 2019-11-10 ENCOUNTER — Encounter: Payer: Self-pay | Admitting: Physical Therapy

## 2019-11-10 ENCOUNTER — Encounter: Payer: Self-pay | Admitting: Physical Medicine & Rehabilitation

## 2019-11-10 ENCOUNTER — Other Ambulatory Visit: Payer: Self-pay

## 2019-11-10 ENCOUNTER — Encounter: Payer: Self-pay | Attending: Physical Medicine & Rehabilitation | Admitting: Physical Medicine & Rehabilitation

## 2019-11-10 VITALS — BP 126/79 | HR 77 | Temp 98.2°F | Ht 58.75 in | Wt 177.6 lb

## 2019-11-10 DIAGNOSIS — G5601 Carpal tunnel syndrome, right upper limb: Secondary | ICD-10-CM | POA: Insufficient documentation

## 2019-11-10 NOTE — Progress Notes (Signed)
Please see media tab for detailed report

## 2019-11-15 ENCOUNTER — Encounter: Payer: Self-pay | Admitting: Physical Therapy

## 2019-11-17 ENCOUNTER — Encounter: Payer: Self-pay | Admitting: Physical Therapy

## 2019-11-21 ENCOUNTER — Ambulatory Visit: Payer: Self-pay | Admitting: Family Medicine

## 2019-11-22 ENCOUNTER — Encounter: Payer: Self-pay | Admitting: Physical Therapy

## 2019-11-23 ENCOUNTER — Ambulatory Visit (INDEPENDENT_AMBULATORY_CARE_PROVIDER_SITE_OTHER): Payer: Self-pay | Admitting: Orthopaedic Surgery

## 2019-11-23 ENCOUNTER — Encounter: Payer: Self-pay | Admitting: Orthopaedic Surgery

## 2019-11-23 VITALS — Ht 59.0 in | Wt 175.6 lb

## 2019-11-23 DIAGNOSIS — G5603 Carpal tunnel syndrome, bilateral upper limbs: Secondary | ICD-10-CM

## 2019-11-23 NOTE — Progress Notes (Signed)
Office Visit Note   Patient: Kari Ortega           Date of Birth: 07/28/1980           MRN: [id number] Visit Date: 11/23/2019              Requested by: Antony Blackbird, MD [place],  [place] [address] PCP: Antony Blackbird, MD   Assessment & Plan: Visit Diagnoses:  1. Bilateral carpal tunnel syndrome     Plan: Impression is bilateral carpal tunnel syndrome moderate to severe on the right and moderate on the left.  We have discussed cortisone injections versus our recommendation of surgical intervention due to the severity of compression.  The patient has elected to proceed with carpal tunnel release with the right one being first.  Risk, benefits and poss complications reviewed.  Rehab recovery time discussed.  All questions were answered. Today's encounter was made more complex due to language barrier.  Follow-Up Instructions: Return for post-op.   Orders:  No orders of the defined types were placed in this encounter.  No orders of the defined types were placed in this encounter.     Procedures: No procedures performed   Clinical Data: No additional findings.   Subjective: Chief Complaint  Patient presents with  . Right Hand - Follow-up  . Left Hand - Follow-up    NCV/EMG     HPI patient is a pleasant 39 year old Spanish-speaking right-hand-dominant female who is here today with an interpreter.  She has been dealing with bilateral carpal tunnel syndrome right greater than left.  Recent nerve conduction study showed moderate to severe compression on the right and moderate on the left.  She continues to have symptoms worse at night as well as with using her hands.  She has tried night splints without relief of symptoms.  Review of Systems as detailed in HPI.  All others reviewed and are negative.   Objective: Vital Signs: Ht 4\' 11"  (1.499 m)   Wt 175 lb 9.6 oz (79.7 kg)   BMI 35.47 kg/m   Physical Exam well-developed well-nourished  female no acute distress.  Alert and oriented x3.  Ortho Exam examination of both hands reveals positive Tinel on the right negative Tinel on the left.  Negative Phalen both sides.  Slightly decreased grip strength on the right.  No thenar atrophy.  She is neurovascular intact distally.  Specialty Comments:  No specialty comments available.  Imaging: No new imaging   PMFS History: Patient Active Problem List   Diagnosis Date Noted  . Carpal tunnel syndrome of right wrist 11/10/2019  . Symptomatic cholelithiasis 06/13/2018  . Postpartum care and examination 04/29/2018  . Urine frequency 04/29/2018  . Normal labor 03/18/2018  . 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 BMI 30-39.9 09/21/2017  . Obesity in pregnancy 09/21/2017  . Language barrier 09/21/2017  . Supervision of high risk pregnancy, antepartum 08/24/2017  . AMA (advanced maternal age) multigravida 35+ 08/24/2017  . HYPERTRIGLYCERIDEMIA 12/15/2006   Past Medical History:  Diagnosis Date  . Anemia   . 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  Unable to determine reason per pt.    . Irregular periods     Family History  Problem Relation Age of Onset  . Diabetes Mother     Past Surgical History:  Procedure Laterality Date  . CHOLECYSTECTOMY N/A 06/14/2018   Procedure: LAPAROSCOPIC CHOLECYSTECTOMY;  Surgeon: [name], [name], MD;  Location: [facility];  Service: General;  Laterality: N/A;  . TOOTH EXTRACTION     Social History   Occupational History  . Not on file  Tobacco Use  . Smoking status: Never Smoker  . Smokeless tobacco: Never Used  Vaping Use  . Vaping Use: Never used  Substance and Sexual Activity  . Alcohol use: No  . Drug use: No  . Sexual activity: Yes    Birth control/protection: None

## 2019-11-24 ENCOUNTER — Encounter: Payer: Self-pay | Admitting: Physical Therapy

## 2019-11-28 ENCOUNTER — Telehealth: Payer: Self-pay | Admitting: Orthopaedic Surgery

## 2019-11-28 NOTE — Telephone Encounter (Signed)
Okay to put referral in

## 2019-11-28 NOTE — Telephone Encounter (Signed)
I'm not sure insurance will approve as physical therapy will not really help for carpal tunnel syndrome

## 2019-11-28 NOTE — Telephone Encounter (Signed)
For the carpal tunnel syndrome?

## 2019-11-28 NOTE — Telephone Encounter (Signed)
Patient stopped by.   She is requesting a referral to PT prior to her surgery. For now she would like to postpone her surgery.   Call back: [phone]

## 2019-11-28 NOTE — Telephone Encounter (Signed)
Advised patient. Would like to continue with PT referral.

## 2019-11-28 NOTE — Telephone Encounter (Signed)
yes

## 2019-11-29 ENCOUNTER — Other Ambulatory Visit: Payer: Self-pay

## 2019-11-29 DIAGNOSIS — G5603 Carpal tunnel syndrome, bilateral upper limbs: Secondary | ICD-10-CM

## 2019-11-29 NOTE — Telephone Encounter (Signed)
Referral made 

## 2019-11-29 NOTE — Telephone Encounter (Signed)
Ok to do then

## 2019-12-13 ENCOUNTER — Other Ambulatory Visit: Payer: Self-pay

## 2019-12-13 ENCOUNTER — Encounter: Payer: Self-pay | Admitting: Occupational Therapy

## 2019-12-13 ENCOUNTER — Ambulatory Visit: Payer: Self-pay | Attending: Physician Assistant | Admitting: Occupational Therapy

## 2019-12-13 DIAGNOSIS — R209 Unspecified disturbances of skin sensation: Secondary | ICD-10-CM | POA: Insufficient documentation

## 2019-12-13 DIAGNOSIS — R208 Other disturbances of skin sensation: Secondary | ICD-10-CM

## 2019-12-13 DIAGNOSIS — M25632 Stiffness of left wrist, not elsewhere classified: Secondary | ICD-10-CM | POA: Insufficient documentation

## 2019-12-13 DIAGNOSIS — M79642 Pain in left hand: Secondary | ICD-10-CM | POA: Insufficient documentation

## 2019-12-13 DIAGNOSIS — M79641 Pain in right hand: Secondary | ICD-10-CM | POA: Insufficient documentation

## 2019-12-13 DIAGNOSIS — M25631 Stiffness of right wrist, not elsewhere classified: Secondary | ICD-10-CM | POA: Insufficient documentation

## 2019-12-13 DIAGNOSIS — R600 Localized edema: Secondary | ICD-10-CM | POA: Insufficient documentation

## 2019-12-13 NOTE — Therapy (Signed)
[facility] [address] [place], [place], [address] Phone: [phone]   Fax:  [phone]  Occupational Therapy Evaluation  Patient Details  Name: Kari Ortega MRN: [id number] Date of Birth: [birth year] Referring Provider (OT): Dwana Melena   Encounter Date: 12/13/2019   OT End of Session - 12/13/19 1638    Visit Number 1    Number of Visits 13    Authorization Type self pay    OT Start Time 1230    OT Stop Time 1315    OT Time Calculation (min) 45 min    Activity Tolerance Patient tolerated treatment well    Behavior During Therapy [facility] for tasks assessed/performed           Past Medical History:  Diagnosis Date  . Anemia   . 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  Unable to determine reason per pt.    . Irregular periods     Past Surgical History:  Procedure Laterality Date  . CHOLECYSTECTOMY N/A 06/14/2018   Procedure: LAPAROSCOPIC CHOLECYSTECTOMY;  Surgeon: [name], [name], MD;  Location: [facility];  Service: General;  Laterality: N/A;  . TOOTH EXTRACTION      There were no vitals filed for this visit.   Subjective Assessment - 12/13/19 1238    Subjective  I was scheduled for surgery, but I wanted to try therapy first    Patient is accompanied by: Interpreter   Mariel   Currently in Pain? Yes    Pain Score 4     Pain Location Hand    Pain Orientation Right;Left    Pain Descriptors / Indicators Numbness    Pain Type Chronic pain    Pain Onset More than a month ago    Pain Frequency Constant    Aggravating Factors  using my hands    Pain Relieving Factors rest             [facility] OT Assessment - 12/13/19 0001      Assessment   Medical Diagnosis Carpal Tunnel Syndrome    Referring Provider (OT) Dwana Melena    Onset Date/Surgical Date 11/29/19    Hand Dominance Right    Prior Therapy No      Prior Function   Level of Independence Independent  with basic ADLs    Vocation Works at home   Mother - 3 children   Leisure  homemaker      ADL   Eating/Feeding Minimal assistance   cutting meat   Grooming Independent    Upper Body Bathing Independent    Lower Body Bathing Independent    Upper Body Dressing Increased time    Lower Body Dressing Independent      IADL   Prior Level of Function Light Housekeeping independent    Light Housekeeping Performs light daily tasks such as dishwashing, bed making   children helping   Prior Level of Function Meal Prep independent    Meal Prep --   reports difficulty with cutting, peeling, chopping     Written Expression   Dominant Hand Right    Handwriting 100% legible      Posture/Postural Control   Posture/Postural Control No significant limitations      Sensation   Light Touch Impaired by gross assessment   diminished thumb, 2-4 bilaterally   Stereognosis Appears Intact    Hot/Cold Not tested   reports no problem   Additional Comments Hypersensitivity to hot  Coordination   9 Hole Peg Test Right;Left    Right 9 Hole Peg Test 23.68    Left 9 Hole Peg Test 22.75      ROM / Strength   AROM / PROM / Strength AROM      AROM   Overall AROM  Deficits    Overall AROM Comments Limited shoulder range bilaterally      Hand Function   Right Hand Gross Grasp Impaired    Right Hand Grip (lbs) 42.5    Right Hand Lateral Pinch 16 lbs    Right Hand 3 Point Pinch 20 lbs    Left Hand Gross Grasp Impaired    Left Hand Grip (lbs) 46.7    Left Hand Lateral Pinch 18 lbs    Left 3 point pinch 19 lbs                           OT Education - 12/13/19 1637    Education Details avoid excessive flexion in wrists (eg carrying son) , avoide repetitive wrist flexion activities, wear braces for rest and sleep if tolerated    Person(s) Educated Patient    Methods Explanation    Comprehension Verbalized understanding            OT Short Term Goals - 12/13/19 1645      OT  SHORT TERM GOAL #1   Title Patient will complete a home exercise program designed to improve range of motion in BUE's due 01/12/20    Time 3    Period Weeks    Status New    Target Date 01/12/20      OT SHORT TERM GOAL #2   Title Patient will demonstrate awareness of edema management techniques    Time 3    Period Weeks    Status New      OT SHORT TERM GOAL #3   Title Patient will demonstrate awareness of task modification to reduce symptoms    Time 3    Period Weeks    Status New      OT SHORT TERM GOAL #4   Title Patient will demonstrate effective splint wear and care    Time 3    Period Weeks    Status New             OT Long Term Goals - 12/13/19 1647      OT LONG TERM GOAL #1   Title Patient will complete an updated HEP to address strength and range of motion in BUE'S due 02/11/20    Time 6    Period Weeks    Status New    Target Date 02/11/20      OT LONG TERM GOAL #2   Title Patient will report decreased symptoms with ADL (e.g. combing or washing hair)    Time 6    Period Weeks    Status New      OT LONG TERM GOAL #3   Title Patient will demonstrate 5 lb increase in grip strength in BUE to improve functional grip    Time 6    Period Weeks    Status New                 Plan - 12/13/19 1639    Clinical Impression Statement Patient is a 39 year old woman with 2+ yr history of carpal tunnel symptoms.  Symptoms started during pregnancy and have since progressed.  Patient was  scheduled for carpal tunnel surgery, but has opted to try therapy first,  Patient is experiencing weakness, numbness and pain in BUE (rue>lue)  Patient will benefit from skilled OT intervention to reduce symptoms and to help her regain strength and functional use in BUE.    OT Occupational Profile and History Problem Focused Assessment - Including review of records relating to presenting problem    Occupational performance deficits (Please refer to evaluation for details):  ADL's;IADL's;Rest and Sleep    Body Structure / Function / Physical Skills ADL;Coordination;Endurance;GMC;UE functional use;Sensation;Fascial restriction;Decreased knowledge of precautions;Body mechanics;Flexibility;IADL;Pain;Strength;FMC;Dexterity;Edema;Mobility;ROM    Rehab Potential Good    Clinical Decision Making Limited treatment options, no task modification necessary    Comorbidities Affecting Occupational Performance: None    Modification or Assistance to Complete Evaluation  No modification of tasks or assist necessary to complete eval    OT Frequency 2x / week    OT Duration 6 weeks    OT Treatment/Interventions Self-care/ADL training;Therapeutic exercise;Patient/family education;Splinting;Neuromuscular education;Fluidtherapy;Therapeutic activities;Passive range of motion;Manual Therapy;DME and/or AE instruction;Contrast Bath;Ultrasound;Cryotherapy;Moist Heat    Plan Review OT goals, Teach conservative management CTS HEP, symptom management - modalities - fluido or [place]    Consulted and Agree with Plan of Care Patient           Patient will benefit from skilled therapeutic intervention in order to improve the following deficits and impairments:   Body Structure / Function / Physical Skills: ADL, Coordination, Endurance, GMC, UE functional use, Sensation, Fascial restriction, Decreased knowledge of precautions, Body mechanics, Flexibility, IADL, Pain, Strength, FMC, Dexterity, Edema, Mobility, ROM       Visit Diagnosis: Pain in right hand - Plan: Ot plan of care cert/re-cert  Pain in left hand - Plan: Ot plan of care cert/re-cert  Localized edema - Plan: Ot plan of care cert/re-cert  Stiffness of left wrist, not elsewhere classified - Plan: Ot plan of care cert/re-cert  Stiffness of right wrist, not elsewhere classified - Plan: Ot plan of care cert/re-cert  Other disturbances of skin sensation - Plan: Ot plan of care cert/re-cert    Problem List Patient Active Problem  List   Diagnosis Date Noted  . Carpal tunnel syndrome of right wrist 11/10/2019  . Symptomatic cholelithiasis 06/13/2018  . Postpartum care and examination 04/29/2018  . Urine frequency 04/29/2018  . Normal labor 03/18/2018  . 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 BMI 30-39.9 09/21/2017  . Obesity in pregnancy 09/21/2017  . Language barrier 09/21/2017  . Supervision of high risk pregnancy, antepartum 08/24/2017  . AMA (advanced maternal age) multigravida 35+ 08/24/2017  . HYPERTRIGLYCERIDEMIA 12/15/2006    Kari Ortega, Kari Ortega 12/13/2019, 4:53 PM  [facility] [address] [place], [place], [address] Phone: [phone]   Fax:  [phone]  Name: Kari Ortega MRN: [id number] Date of Birth: 08-28-1980

## 2019-12-14 ENCOUNTER — Ambulatory Visit (HOSPITAL_BASED_OUTPATIENT_CLINIC_OR_DEPARTMENT_OTHER): Admit: 2019-12-14 | Payer: Self-pay | Admitting: Orthopaedic Surgery

## 2019-12-14 ENCOUNTER — Encounter (HOSPITAL_BASED_OUTPATIENT_CLINIC_OR_DEPARTMENT_OTHER): Payer: Self-pay

## 2019-12-14 SURGERY — CARPAL TUNNEL RELEASE
Anesthesia: Regional | Laterality: Right

## 2019-12-15 ENCOUNTER — Other Ambulatory Visit: Payer: Self-pay

## 2019-12-15 ENCOUNTER — Ambulatory Visit: Payer: Self-pay | Attending: Family Medicine | Admitting: Family Medicine

## 2019-12-15 ENCOUNTER — Encounter: Payer: Self-pay | Admitting: Family Medicine

## 2019-12-15 VITALS — BP 113/77 | HR 71 | Ht 59.0 in | Wt 174.4 lb

## 2019-12-15 DIAGNOSIS — Z0000 Encounter for general adult medical examination without abnormal findings: Secondary | ICD-10-CM

## 2019-12-15 DIAGNOSIS — Z13228 Encounter for screening for other metabolic disorders: Secondary | ICD-10-CM

## 2019-12-15 DIAGNOSIS — Z124 Encounter for screening for malignant neoplasm of cervix: Secondary | ICD-10-CM

## 2019-12-15 DIAGNOSIS — R1032 Left lower quadrant pain: Secondary | ICD-10-CM

## 2019-12-15 DIAGNOSIS — Z1159 Encounter for screening for other viral diseases: Secondary | ICD-10-CM

## 2019-12-15 NOTE — Patient Instructions (Addendum)
Mantenimiento de Technical sales engineer en [place] [employer], Female Adoptar un estilo de vida saludable y recibir atencin preventiva son importantes para promover la salud y Musician. Consulte al mdico sobre:  El esquema adecuado para hacerse pruebas y exmenes peridicos.  Cosas que puede hacer por su cuenta para prevenir enfermedades y [employer]. Qu debo saber sobre la dieta, el peso y el ejercicio? Consuma una dieta saludable   Consuma una dieta que incluya muchas verduras, frutas, productos lcteos con bajo contenido de [place] y Advertising account planner.  No consuma muchos alimentos ricos en grasas slidas, azcares agregados o sodio. Mantenga un peso saludable El ndice de masa muscular [facility] [place]) se [place] para identificar problemas de [place]. Proporciona una estimacin de la grasa corporal basndose en el peso y la altura. Su mdico puede ayudarle a Radiation protection practitioner [place] y a Scientist, forensic o Theatre manager un peso saludable. Haga ejercicio con regularidad Haga ejercicio con regularidad. Esta es una de las prcticas ms importantes que puede hacer por su salud. La mayora de los adultos deben seguir estas pautas:  Optometrist, al menos, 185mnutos de actividad fsica por semana. El ejercicio debe aumentar la frecuencia cardaca y hNature conservation officertranspirar (ejercicio de intensidad moderada).  Hacer ejercicios de fortalecimiento por lo mHalliburton Companypor semana. Agregue esto a su plan de ejercicio de intensidad moderada.  Pasar menos tiempo sentados. Incluso la actividad fsica ligera puede ser beneficiosa. Controle sus niveles de colesterol y lpidos en la sangre Comience a realizarse anlisis de lpidos y cResearch officer, trade unionen la sangre a los 20aos y luego reptalos cada 5aos. Hgase controlar los niveles de colesterol con mayor frecuencia si:  Sus niveles de lpidos y colesterol son altos.  Es mayor de 40aos.  Presenta un alto riesgo de padecer enfermedades cardacas. Qu debo saber sobre las pruebas de  deteccin del cncer? Segn su historia clnica y sus antecedentes familiares, es posible que deba realizarse pruebas de deteccin del cncer en diferentes edades. Esto puede incluir pruebas de deteccin de lo siguiente:  Cncer de mama.  Cncer de cuello uterino.  Cncer colorrectal.  Cncer de piel.  Cncer de pulmn. Qu debo saber sobre la enfermedad cardaca, la diabetes y la hipertensin arterial? Presin arterial y enfermedad cardaca  La hipertensin arterial causa enfermedades cardacas y aSerbiael riesgo de accidente cerebrovascular. Es ms probable que esto se manifieste en las personas que tienen lecturas de presin arterial alta, tienen ascendencia africana o tienen sobrepeso.  Hgase controlar la presin arterial: ? Cada 3 a 5 aos si tiene entre 18 y 389aos. ? Todos los aos si es mayor de 4[place] Diabetes Realcese exmenes de deteccin de la diabetes con regularidad. Este anlisis revisa el nivel de azcar en la sangre en aStronghurst Hgase las pruebas de deteccin:  Cada tresaos despus de los 428aosde edad si tiene un peso normal y un bajo riesgo de padecer diabetes.  Con ms frecuencia y a partir de uSt. Petersedad inferior si tiene sobrepeso o un alto riesgo de padecer diabetes. Qu debo saber sobre la prevencin de infecciones? Hepatitis B Si tiene un riesgo ms alto de contraer hepatitis B, debe someterse a un examen de deteccin de este virus. Hable con el mdico para averiguar si tiene riesgo de contraer la infeccin por hepatitis B. Hepatitis C Se recomienda el anlisis a:  THexion Specialty Chemicals1945 y 1965.  Todas las personas que tengan un riesgo de haber contrado hepatitis C. Enfermedades de transmisin sexual (ETS)  Hgase las  pruebas de deteccin de ITS, incluidas la gonorrea y la clamidia, si: ? Es sexualmente activa y es menor de 24aos. ? Es mayor de 24aos, y el mdico le informa que corre riesgo de tener este tipo de  infecciones. ? La actividad sexual ha cambiado desde que le hicieron la ltima prueba de deteccin y tiene un riesgo mayor de tener clamidia o gonorrea. Pregntele al mdico si usted tiene riesgo.  Pregntele al mdico si usted tiene un alto riesgo de contraer VIH. El mdico tambin puede recomendarle un medicamento recetado para ayudar a evitar la infeccin por el VIH. Si elige tomar medicamentos para prevenir el VIH, primero debe hacerse los anlisis de deteccin del VIH. Luego debe hacerse anlisis cada 3meses mientras est tomando los medicamentos. Embarazo  Si est por dejar de menstruar (fase premenopusica) y usted puede quedar embarazada, busque asesoramiento antes de quedar embarazada.  Tome de 400 a 800microgramos (mcg) de cido flico todos los das si queda embarazada.  Pida mtodos de control de la natalidad (anticonceptivos) si desea evitar un embarazo no deseado. Osteoporosis y menopausia La osteoporosis es una enfermedad en la que los huesos pierden los minerales y la fuerza por el avance de la edad. El resultado pueden ser fracturas en los huesos. Si tiene 65aos o ms, o si est en riesgo de sufrir osteoporosis y fracturas, pregunte a su mdico si debe:  Hacerse pruebas de deteccin de prdida sea.  Tomar un suplemento de calcio o de vitamina D para reducir el riesgo de fracturas.  Recibir terapia de reemplazo hormonal (TRH) para tratar los sntomas de la menopausia. Siga estas instrucciones en su casa: Estilo de vida  No consuma ningn producto que contenga nicotina o tabaco, como cigarrillos, cigarrillos electrnicos y tabaco de mascar. Si necesita ayuda para dejar de fumar, consulte al mdico.  No consuma drogas.  No comparta agujas.  Solicite ayuda a su mdico si necesita apoyo o informacin para abandonar las drogas. Consumo de alcohol  No beba alcohol si: ? Su mdico le indica no hacerlo. ? Est embarazada, puede estar embarazada o est tratando de quedar  embarazada.  Si bebe alcohol: ? Limite la cantidad que consume de 0 a 1 medida por da. ? Limite la ingesta si est amamantando.  Est atento a la cantidad de alcohol que hay en las bebidas que toma. En los [place], una medida equivale a una botella de cerveza de 12oz (355ml), un vaso de vino de 5oz (148ml) o un vaso de una bebida alcohlica de alta graduacin de 1oz (44ml). Instrucciones generales  Realcese los estudios de rutina de la salud, dentales y de la vista.  Mantngase al da con las vacunas.  Infrmele a su mdico si: ? Se siente deprimida con frecuencia. ? Alguna vez ha sido vctima de maltrato o no se siente segura en su casa. Resumen  Adoptar un estilo de vida saludable y recibir atencin preventiva son importantes para promover la salud y el bienestar.  Siga las instrucciones del mdico acerca de una dieta saludable, el ejercicio y la realizacin de pruebas o exmenes para detectar enfermedades.  Siga las instrucciones del mdico con respecto al control del colesterol y la presin arterial. Esta informacin no tiene como fin reemplazar el consejo del mdico. Asegrese de hacerle al mdico cualquier pregunta que tenga. Document Revised: 05/19/2018 Document Reviewed: 05/19/2018 Elsevier Patient Education  2020 Elsevier Inc.  

## 2019-12-15 NOTE — Progress Notes (Signed)
Subjective:  Patient ID: Kari Ortega, female    DOB: [birth year]  Age: 39 y.o. MRN: [id number]  CC: Annual Exam and Gynecologic Exam   HPI Eli Adami presents for an annual physical exam. She is due for Pap smear today. She has had LLQ pain for the last 2 months and denies presence of urinary symptoms, vaginal discharge, nausea, vomiting. LMP was  Past Medical History:  Diagnosis Date  . Anemia   . 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  Unable to determine reason per pt.    . Irregular periods     Past Surgical History:  Procedure Laterality Date  . CHOLECYSTECTOMY N/A 06/14/2018   Procedure: LAPAROSCOPIC CHOLECYSTECTOMY;  Surgeon: [name], [name], MD;  Location: [facility];  Service: General;  Laterality: N/A;  . TOOTH EXTRACTION      Family History  Problem Relation Age of Onset  . Diabetes Mother     No Known Allergies  Outpatient Medications Prior to Visit  Medication Sig Dispense Refill  . acetaminophen (TYLENOL) 500 MG tablet Take 2 tablets (1,000 mg total) by mouth every 6 (six) hours as needed. 30 tablet 0  . Cholecalciferol (VITAMIN D-3) 125 MCG (5000 UT) TABS Take 1 tablet by mouth daily. 90 tablet 3  . ibuprofen (ADVIL) 600 MG tablet Take 1 tablet (600 mg total) by mouth every 8 (eight) hours as needed. 30 tablet 0  . meloxicam (MOBIC) 15 MG tablet Take 0.5-1 tablets (7.5-15 mg total) by mouth daily as needed for pain. 30 tablet 6  . Sunscreen SPF50 LOTN Apply 1 application topically in the morning, at noon, and at bedtime. 100 mL 0  . diazepam (VALIUM) 5 MG tablet Take 1 by mouth 1 hour  pre-procedure with very light food. May bring 2nd tablet to appointment. (Patient not taking: Reported on 12/15/2019) 2 tablet 0  . Prenatal Vit-Fe Fumarate-FA (MULTIVITAMIN-PRENATAL) 27-0.8 MG TABS tablet Take 1 tablet by mouth daily at 12 noon. (Patient not taking: Reported on 12/15/2019)     Facility-Administered  Medications Prior to Visit  Medication Dose Route Frequency Provider Last Rate Last Admin  . polyethylene glycol powder (GLYCOLAX/MIRALAX) container 255 g  1 Container Oral Once Rasch, Anderson Malta I, NP         ROS Review of Systems  Constitutional: Negative for activity change, appetite change and fatigue.  HENT: Negative for congestion, sinus pressure and sore throat.   Eyes: Negative for visual disturbance.  Respiratory: Negative for cough, chest tightness, shortness of breath and wheezing.   Cardiovascular: Negative for chest pain and palpitations.  Gastrointestinal: Positive for abdominal pain. Negative for abdominal distention and constipation.  Endocrine: Negative for polydipsia.  Genitourinary: Negative for dysuria and frequency.  Musculoskeletal: Negative for arthralgias and back pain.  Skin: Negative for rash.  Neurological: Negative for tremors, light-headedness and numbness.  Hematological: Does not bruise/bleed easily.  Psychiatric/Behavioral: Negative for agitation and behavioral problems.    Objective:  BP 113/77   Pulse 71   Ht 4\' 11"  (1.499 m)   Wt 174 lb 6.4 oz (79.1 kg)   SpO2 97%   BMI 35.22 kg/m   BP/Weight 12/15/2019 06/13/5425 0/10/2374  Systolic BP 283 - 151  Diastolic BP 77 - 79  Wt. (Lbs) 174.4 175.6 177.6  BMI 35.22 35.47 36.18      Physical Exam Exam conducted with a chaperone present.  Constitutional:      General: She is not in acute distress.  Appearance: She is well-developed. She is not diaphoretic.  HENT:     Head: Normocephalic.     Right Ear: External ear normal.     Left Ear: External ear normal.     Nose: Nose normal.  Eyes:     Conjunctiva/sclera: Conjunctivae normal.     Pupils: Pupils are equal, round, and reactive to light.  Neck:     Vascular: No JVD.  Cardiovascular:     Rate and Rhythm: Normal rate and regular rhythm.     Heart sounds: Normal heart sounds. No murmur heard.  No gallop.   Pulmonary:     Effort:  Pulmonary effort is normal. No respiratory distress.     Breath sounds: Normal breath sounds. No wheezing or rales.  Chest:     Chest wall: No tenderness.     Breasts:        Right: No mass, nipple discharge, skin change or tenderness.        Left: No mass, nipple discharge, skin change or tenderness.  Abdominal:     General: Bowel sounds are normal. There is no distension.     Palpations: Abdomen is soft. There is no mass.     Tenderness: There is no abdominal tenderness.  Genitourinary:    Comments: External genitalia, cervix, vagina, adnexa-normal Musculoskeletal:        General: No tenderness. Normal range of motion.     Cervical back: Normal range of motion.  Skin:    General: Skin is warm and dry.  Neurological:     Mental Status: She is alert and oriented to person, place, and time.     Deep Tendon Reflexes: Reflexes are normal and symmetric.     CMP Latest Ref Rng & Units 06/14/2018 06/13/2018 06/13/2018  Glucose 70 - 99 mg/dL 84 - 127(H)  BUN 6 - 20 mg/dL 11 - 14  Creatinine 0.44 - 1.00 mg/dL 0.65 0.56 0.77  Sodium 135 - 145 mmol/L 141 - 140  Potassium 3.5 - 5.1 mmol/L 3.5 - 3.6  Chloride 98 - 111 mmol/L 110 - 106  CO2 22 - 32 mmol/L 23 - 24  Calcium 8.9 - 10.3 mg/dL 8.4(L) - 9.8  Total Protein 6.5 - 8.1 g/dL 6.6 - 7.8  Total Bilirubin 0.3 - 1.2 mg/dL 0.9 - 0.6  Alkaline Phos 38 - 126 U/L 162(H) - 132(H)  AST 15 - 41 U/L 209(H) - 163(H)  ALT 0 - 44 U/L 253(H) - 128(H)    Lipid Panel  No results found for: CHOL, TRIG, HDL, CHOLHDL, VLDL, LDLCALC, LDLDIRECT  CBC    Component Value Date/Time   WBC 8.0 06/14/2018 0239   RBC 4.19 06/14/2018 0239   HGB 11.3 (L) 06/14/2018 0239   HGB 10.9 (L) 12/28/2017 0826   HCT 35.2 (L) 06/14/2018 0239   HCT 33.8 (L) 12/28/2017 0826   PLT 269 06/14/2018 0239   PLT 301 12/28/2017 0826   MCV 84.0 06/14/2018 0239   MCV 86 12/28/2017 0826   MCH 27.0 06/14/2018 0239   MCHC 32.1 06/14/2018 0239   RDW 13.8 06/14/2018 0239   RDW 14.9  12/28/2017 0826   LYMPHSABS 1.8 12/21/2017 1501   MONOABS 0.5 07/01/2016 0948   EOSABS 0.2 12/21/2017 1501   BASOSABS 0.0 12/21/2017 1501    Lab Results  Component Value Date   HGBA1C 5.2 08/24/2017    Assessment & Plan:  1. Annual physical exam Counseled on 150 minutes of exercise per week, healthy eating (including decreased daily  intake of saturated fats, cholesterol, added sugars, sodium), routine healthcare maintenance. - CBC with Differential/Platelet - Lipid panel  2. Screening for cervical cancer - Cytology - PAP([place]) - Cervicovaginal ancillary only  3. Screening for metabolic disorder - Basic Metabolic Panel - Hemoglobin A1c  4. Screening for viral disease - HCV RNA quant rflx ultra or genotyp([facility])  5. Left lower quadrant abdominal pain Unknown etiology Discussed prevention of constipation, will send of Pap smear If symptoms persist consider evaluation for ovarian cyst.    No orders of the defined types were placed in this encounter.   Follow-up: Return in about 1 year (around 12/14/2020) for annual Physical exam.       Charlott Rakes, MD, FAAFP. [facility] and [facility], [place]   12/15/2019, 12:21 PM

## 2019-12-15 NOTE — Progress Notes (Signed)
Patient is here for physical, she has been having pain in lower abdomen pelvic area.

## 2019-12-16 LAB — CYTOLOGY - PAP
Comment: NEGATIVE
Diagnosis: NEGATIVE
High risk HPV: NEGATIVE

## 2019-12-16 LAB — CERVICOVAGINAL ANCILLARY ONLY
Bacterial Vaginitis (gardnerella): NEGATIVE
Candida Glabrata: NEGATIVE
Candida Vaginitis: NEGATIVE
Chlamydia: NEGATIVE
Comment: NEGATIVE
Comment: NEGATIVE
Comment: NEGATIVE
Comment: NEGATIVE
Comment: NEGATIVE
Comment: NORMAL
Neisseria Gonorrhea: NEGATIVE
Trichomonas: NEGATIVE

## 2019-12-17 LAB — CBC WITH DIFFERENTIAL/PLATELET
Basophils Absolute: 0 10*3/uL (ref 0.0–0.2)
Basos: 0 %
EOS (ABSOLUTE): 0.2 10*3/uL (ref 0.0–0.4)
Eos: 2 %
Hematocrit: 39 % (ref 34.0–46.6)
Hemoglobin: 12.4 g/dL (ref 11.1–15.9)
Immature Grans (Abs): 0 10*3/uL (ref 0.0–0.1)
Immature Granulocytes: 0 %
Lymphocytes Absolute: 2.5 10*3/uL (ref 0.7–3.1)
Lymphs: 22 %
MCH: 27.2 pg (ref 26.6–33.0)
MCHC: 31.8 g/dL (ref 31.5–35.7)
MCV: 86 fL (ref 79–97)
Monocytes Absolute: 0.7 10*3/uL (ref 0.1–0.9)
Monocytes: 6 %
Neutrophils Absolute: 7.8 10*3/uL — ABNORMAL HIGH (ref 1.4–7.0)
Neutrophils: 70 %
Platelets: 302 10*3/uL (ref 150–450)
RBC: 4.56 x10E6/uL (ref 3.77–5.28)
RDW: 14.1 % (ref 11.7–15.4)
WBC: 11.4 10*3/uL — ABNORMAL HIGH (ref 3.4–10.8)

## 2019-12-17 LAB — LIPID PANEL
Chol/HDL Ratio: 5.4 ratio — ABNORMAL HIGH (ref 0.0–4.4)
Cholesterol, Total: 198 mg/dL (ref 100–199)
HDL: 37 mg/dL — ABNORMAL LOW (ref >39)
LDL Chol Calc (NIH): 132 mg/dL — ABNORMAL HIGH (ref 0–99)
Triglycerides: 159 mg/dL — ABNORMAL HIGH (ref 0–149)
VLDL Cholesterol Cal: 29 mg/dL (ref 5–40)

## 2019-12-17 LAB — BASIC METABOLIC PANEL
BUN/Creatinine Ratio: 20 (ref 9–23)
BUN: 12 mg/dL (ref 6–20)
CO2: 22 mmol/L (ref 20–29)
Calcium: 8.6 mg/dL — ABNORMAL LOW (ref 8.7–10.2)
Chloride: 107 mmol/L — ABNORMAL HIGH (ref 96–106)
Creatinine, Ser: 0.59 mg/dL (ref 0.57–1.00)
GFR calc Af Amer: 134 mL/min/{1.73_m2} (ref >59)
GFR calc non Af Amer: 116 mL/min/{1.73_m2} (ref >59)
Glucose: 85 mg/dL (ref 65–99)
Potassium: 4.2 mmol/L (ref 3.5–5.2)
Sodium: 142 mmol/L (ref 134–144)

## 2019-12-17 LAB — HCV RNA QUANT RFLX ULTRA OR GENOTYP: HCV Quant Baseline: NOT DETECTED IU/mL

## 2019-12-17 LAB — HEMOGLOBIN A1C
Est. average glucose Bld gHb Est-mCnc: 117 mg/dL
Hgb A1c MFr Bld: 5.7 % — ABNORMAL HIGH (ref 4.8–5.6)

## 2019-12-21 ENCOUNTER — Encounter: Payer: Self-pay | Admitting: Occupational Therapy

## 2019-12-21 ENCOUNTER — Other Ambulatory Visit: Payer: Self-pay

## 2019-12-21 ENCOUNTER — Ambulatory Visit: Payer: Self-pay | Admitting: Occupational Therapy

## 2019-12-21 DIAGNOSIS — M25632 Stiffness of left wrist, not elsewhere classified: Secondary | ICD-10-CM

## 2019-12-21 DIAGNOSIS — M25631 Stiffness of right wrist, not elsewhere classified: Secondary | ICD-10-CM

## 2019-12-21 DIAGNOSIS — R208 Other disturbances of skin sensation: Secondary | ICD-10-CM

## 2019-12-21 DIAGNOSIS — M79642 Pain in left hand: Secondary | ICD-10-CM

## 2019-12-21 DIAGNOSIS — R600 Localized edema: Secondary | ICD-10-CM

## 2019-12-21 DIAGNOSIS — M79641 Pain in right hand: Secondary | ICD-10-CM

## 2019-12-21 NOTE — Therapy (Signed)
[facility] [address] [place], [place], [address] Phone: [phone]   Fax:  [phone]  Occupational Therapy Treatment  Patient Details  Name: Kari Ortega MRN: [id number] Date of Birth: 11-Mar-1981 Referring Provider (OT): Dwana Melena   Encounter Date: 12/21/2019   OT End of Session - 12/21/19 1432    Visit Number 2    Number of Visits 13    Authorization Type self pay    OT Start Time 1315    OT Stop Time 1400    OT Time Calculation (min) 45 min    Activity Tolerance Patient limited by pain    Behavior During Therapy [facility] for tasks assessed/performed           Past Medical History:  Diagnosis Date  . Anemia   . 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  Unable to determine reason per pt.    . Irregular periods     Past Surgical History:  Procedure Laterality Date  . CHOLECYSTECTOMY N/A 06/14/2018   Procedure: LAPAROSCOPIC CHOLECYSTECTOMY;  Surgeon: [name], [name], MD;  Location: [facility] [facility];  Service: General;  Laterality: N/A;  . TOOTH EXTRACTION      There were no vitals filed for this visit.   Subjective Assessment - 12/21/19 1419    Subjective  My pain is from my neck down to my hand    Patient is accompanied by: Interpreter    Currently in Pain? Yes    Pain Score 4     Pain Location Neck    Pain Orientation Right    Pain Descriptors / Indicators Aching    Pain Type Chronic pain    Pain Onset More than a month ago    Pain Frequency Constant    Aggravating Factors  using my hands    Pain Relieving Factors rest, tylenol                        OT Treatments/Exercises (OP) - 12/21/19 0001      ADLs   ADL Comments Reviewed OT goals.        Modalities   Modalities Moist Heat      Moist Heat Therapy   Number Minutes Moist Heat 10 Minutes    Moist Heat Location Cervical      RUE Fluidotherapy   Number Minutes Fluidotherapy 10  Minutes    RUE Fluidotherapy Location Hand;Wrist;Forearm    Comments increased pain and numbness      LUE Fluidotherapy   Number Minutes Fluidotherapy 10 Minutes    LUE Fluidotherapy Location Hand;Wrist;Upper arm    Comments minor improvement in symptoms                  OT Education - 12/21/19 1432    Education Details carpal tunnel massage, carpal tunnel conservative management - hand exercises    Person(s) Educated Patient    Methods Explanation;Demonstration;Tactile cues;Verbal cues    Comprehension Need further instruction            OT Short Term Goals - 12/21/19 1330      OT SHORT TERM GOAL #1   Title Patient will complete a home exercise program designed to improve range of motion in BUE's due 01/12/20    Time 3    Period Weeks    Status On-going    Target Date 01/12/20      OT SHORT TERM GOAL #2  Title Patient will demonstrate awareness of edema management techniques    Time 3    Period Weeks    Status On-going      OT SHORT TERM GOAL #3   Title Patient will demonstrate awareness of task modification to reduce symptoms    Time 3    Period Weeks    Status On-going      OT SHORT TERM GOAL #4   Title Patient will demonstrate effective splint wear and care    Time 3    Period Weeks    Status On-going             OT Long Term Goals - 12/21/19 1336      OT LONG TERM GOAL #1   Title Patient will complete an updated HEP to address strength and range of motion in BUE'S due 02/11/20    Time 6    Period Weeks    Status On-going      OT LONG TERM GOAL #2   Title Patient will report decreased symptoms with ADL (e.g. combing or washing hair)    Time 6    Period Weeks    Status On-going      OT LONG TERM GOAL #3   Title Patient will demonstrate 5 lb increase in grip strength in BUE to improve functional grip    Time 6    Period Weeks    Status On-going                 Plan - 12/21/19 1433    Clinical Impression Statement Patient with  symptoms more significant on right side - from neck down through right arm.  Left sided symptoms less severe- although near constant.  Patient very fearful at idea of surgery, and worried that she doesn't have adequate help at home afterward.  Patient does not respond well to heat modalities in RUE especially - exaggerates numbness and pain.    OT Occupational Profile and History Problem Focused Assessment - Including review of records relating to presenting problem    Occupational performance deficits (Please refer to evaluation for details): ADL's;IADL's;Rest and Sleep    Body Structure / Function / Physical Skills ADL;Coordination;Endurance;GMC;UE functional use;Sensation;Fascial restriction;Decreased knowledge of precautions;Body mechanics;Flexibility;IADL;Pain;Strength;FMC;Dexterity;Edema;Mobility;ROM    Rehab Potential Good    Clinical Decision Making Limited treatment options, no task modification necessary    Comorbidities Affecting Occupational Performance: None    Modification or Assistance to Complete Evaluation  No modification of tasks or assist necessary to complete eval    OT Frequency 2x / week    OT Duration 6 weeks    OT Treatment/Interventions Self-care/ADL training;Therapeutic exercise;Patient/family education;Splinting;Neuromuscular education;Fluidtherapy;Therapeutic activities;Passive range of motion;Manual Therapy;DME and/or AE instruction;Contrast Bath;Ultrasound;Cryotherapy;Moist Heat    Plan Does not do well with fluido.  Need to address right arm/neck range of motion - but did not tolerate median nerve glides due to shoulder pain.  Possibly ultrasound/manual, and ice.    OT Home Exercise Plan hand exercises- consevative management carpal tunel    Consulted and Agree with Plan of Care Patient           Patient will benefit from skilled therapeutic intervention in order to improve the following deficits and impairments:   Body Structure / Function / Physical Skills: ADL,  Coordination, Endurance, GMC, UE functional use, Sensation, Fascial restriction, Decreased knowledge of precautions, Body mechanics, Flexibility, IADL, Pain, Strength, FMC, Dexterity, Edema, Mobility, ROM       Visit Diagnosis: Pain in right hand  Pain in left hand  Localized edema  Stiffness of left wrist, not elsewhere classified  Stiffness of right wrist, not elsewhere classified  Other disturbances of skin sensation    Problem List Patient Active Problem List   Diagnosis Date Noted  . Carpal tunnel syndrome of right wrist 11/10/2019  . Symptomatic cholelithiasis 06/13/2018  . Postpartum care and examination 04/29/2018  . Urine frequency 04/29/2018  . Normal labor 03/18/2018  . 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 BMI 30-39.9 09/21/2017  . Obesity in pregnancy 09/21/2017  . Language barrier 09/21/2017  . Supervision of high risk pregnancy, antepartum 08/24/2017  . AMA (advanced maternal age) multigravida 35+ 08/24/2017  . HYPERTRIGLYCERIDEMIA 12/15/2006    Mariah Milling, OTR/L 12/21/2019, 2:39 PM  [facility] [address] [place], [place], [address] Phone: [phone]   Fax:  [phone]  Name: Kari Ortega MRN: [id number] Date of Birth: 03/06/1981

## 2019-12-23 ENCOUNTER — Telehealth: Payer: Self-pay

## 2019-12-23 DIAGNOSIS — R102 Pelvic and perineal pain: Secondary | ICD-10-CM

## 2019-12-23 NOTE — Telephone Encounter (Signed)
Patient name and DOB has been verified Patient was informed of lab results. Patient had no questions.  

## 2019-12-23 NOTE — Telephone Encounter (Signed)
Patient was called and given normal lab results, but patient would like to get further evaluation of the lower abdominal pain.

## 2019-12-23 NOTE — Telephone Encounter (Signed)
-----   Message from Charlott Rakes, MD sent at 12/17/2019  3:57 PM EDT ----- Please inform the patient that labs are normal. Thank you.

## 2019-12-26 NOTE — Addendum Note (Signed)
Addended by: Charlott Rakes on: 12/26/2019 12:30 PM   Modules accepted: Orders

## 2019-12-26 NOTE — Telephone Encounter (Signed)
Patient was called and informed of [place] appointment

## 2019-12-26 NOTE — Telephone Encounter (Signed)
Pelvic ultrasound has been ordered to evaluate further

## 2019-12-27 ENCOUNTER — Telehealth: Payer: Self-pay | Admitting: Orthopaedic Surgery

## 2019-12-27 ENCOUNTER — Inpatient Hospital Stay: Payer: Self-pay | Admitting: Orthopaedic Surgery

## 2019-12-27 NOTE — Telephone Encounter (Signed)
Patient did not come into her appt today. States this appt was a post op appt and she never had SU. States she will be doing some PT sessions and then if not any better she will call us to make an appt if needed.

## 2019-12-27 NOTE — Telephone Encounter (Signed)
Pt would like a CB in regards to her appt on 12/27/19; pt will need an interpreter on the line.   [phone]

## 2019-12-29 ENCOUNTER — Ambulatory Visit: Payer: Self-pay | Admitting: Family Medicine

## 2019-12-30 LAB — GLUCOSE, POCT (MANUAL RESULT ENTRY): POC Glucose: 110 mg/dl — AB (ref 70–99)

## 2020-01-02 ENCOUNTER — Other Ambulatory Visit: Payer: Self-pay

## 2020-01-02 ENCOUNTER — Ambulatory Visit (HOSPITAL_COMMUNITY)
Admission: RE | Admit: 2020-01-02 | Discharge: 2020-01-02 | Disposition: A | Discharge status: Home / Self Care | Payer: Self-pay | Source: Ambulatory Visit | Attending: Family Medicine | Admitting: Family Medicine

## 2020-01-02 DIAGNOSIS — R102 Pelvic and perineal pain unspecified side: Secondary | ICD-10-CM

## 2020-01-03 ENCOUNTER — Ambulatory Visit: Payer: Self-pay | Admitting: Occupational Therapy

## 2020-01-03 ENCOUNTER — Other Ambulatory Visit (INDEPENDENT_AMBULATORY_CARE_PROVIDER_SITE_OTHER): Payer: Self-pay | Admitting: Family Medicine

## 2020-01-03 ENCOUNTER — Encounter: Payer: Self-pay | Admitting: Occupational Therapy

## 2020-01-03 DIAGNOSIS — R600 Localized edema: Secondary | ICD-10-CM

## 2020-01-03 DIAGNOSIS — R208 Other disturbances of skin sensation: Secondary | ICD-10-CM

## 2020-01-03 DIAGNOSIS — M25631 Stiffness of right wrist, not elsewhere classified: Secondary | ICD-10-CM

## 2020-01-03 DIAGNOSIS — M79642 Pain in left hand: Secondary | ICD-10-CM

## 2020-01-03 DIAGNOSIS — M25632 Stiffness of left wrist, not elsewhere classified: Secondary | ICD-10-CM

## 2020-01-03 DIAGNOSIS — R102 Pelvic and perineal pain: Secondary | ICD-10-CM

## 2020-01-03 DIAGNOSIS — M79641 Pain in right hand: Secondary | ICD-10-CM

## 2020-01-03 NOTE — Therapy (Signed)
[facility] [address] [place], [place], [address] Phone: [phone]   Fax:  [phone]  Occupational Therapy Treatment  Patient Details  Name: Kari Ortega MRN: [id number] Date of Birth: 23-Jun-1980 Referring Provider (OT): Dwana Melena   Encounter Date: 01/03/2020   OT End of Session - 01/03/20 1503    Visit Number 3    Number of Visits 13    Authorization Type self pay    OT Start Time 1407    OT Stop Time 1445    OT Time Calculation (min) 38 min    Activity Tolerance Patient tolerated treatment well    Behavior During Therapy [facility] for tasks assessed/performed           Past Medical History:  Diagnosis Date   Anemia    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  Unable to determine reason per pt.     Irregular periods     Past Surgical History:  Procedure Laterality Date   CHOLECYSTECTOMY N/A 06/14/2018   Procedure: LAPAROSCOPIC CHOLECYSTECTOMY;  Surgeon: [name], [name], MD;  Location: [facility];  Service: General;  Laterality: N/A;   TOOTH EXTRACTION      There were no vitals filed for this visit.   Subjective Assessment - 01/03/20 1411    Subjective  A little better    Patient is accompanied by: Interpreter   Verdis Frederickson [phone]   Currently in Pain? Yes    Pain Score 6     Pain Location Neck    Pain Orientation Right    Pain Descriptors / Indicators Aching    Pain Type Chronic pain    Pain Onset More than a month ago    Pain Frequency Constant    Aggravating Factors  using my hands    Pain Relieving Factors rest, tylenol                        OT Treatments/Exercises (OP) - 01/03/20 0001      Neurological Re-education Exercises   Elbow Extension Self ROM   elbow, wrist, digit extension stretch L/R - Able to tolerate     Modalities   Modalities Moist Heat      Moist Heat Therapy   Number Minutes Moist Heat 10 Minutes    Moist Heat  Location Cervical;Shoulder      Ultrasound   Ultrasound Location volar wrist, distal forearm, proximal hand - R/L    Ultrasound Parameters 26mhz, pulsed 20%, 0.8 W/CM2 8 min R, and 5 min L    Ultrasound Goals Edema;Pain   reduction in pain     Manual Therapy   Soft tissue mobilization volar wrist, gentle mobilization.  Metacarpal/thumb mobilization, distal forearm without increase in symptoms                  OT Education - 01/03/20 1502    Education Details elbow, wrist, digit composite extension stretch Right and Left    Person(s) Educated Patient    Methods Explanation;Demonstration;Verbal cues    Comprehension Verbalized understanding;Returned demonstration            OT Short Term Goals - 01/03/20 1505      OT SHORT TERM GOAL #1   Title Patient will complete a home exercise program designed to improve range of motion in BUE's due 01/12/20    Time 3    Period Weeks    Status On-going  Target Date 01/12/20      OT SHORT TERM GOAL #2   Title Patient will demonstrate awareness of edema management techniques    Time 3    Period Weeks    Status On-going      OT SHORT TERM GOAL #3   Title Patient will demonstrate awareness of task modification to reduce symptoms    Time 3    Period Weeks    Status On-going      OT SHORT TERM GOAL #4   Title Patient will demonstrate effective splint wear and care    Time 3    Period Weeks    Status On-going             OT Long Term Goals - 01/03/20 1505      OT LONG TERM GOAL #1   Title Patient will complete an updated HEP to address strength and range of motion in BUE'S due 02/11/20    Time 6    Period Weeks    Status On-going      OT LONG TERM GOAL #2   Title Patient will report decreased symptoms with ADL (e.g. combing or washing hair)    Time 6    Period Weeks    Status On-going      OT LONG TERM GOAL #3   Title Patient will demonstrate 5 lb increase in grip strength in BUE to improve functional grip     Time 6    Period Weeks    Status On-going                 Plan - 01/03/20 1503    Clinical Impression Statement Patient with slightly less pain, in hands - still troubled by right neck/shoulder pain.  Patient with continued numbness in thumb and 2-4 digits.    OT Occupational Profile and History Problem Focused Assessment - Including review of records relating to presenting problem    Occupational performance deficits (Please refer to evaluation for details): ADL's;IADL's;Rest and Sleep    Body Structure / Function / Physical Skills ADL;Coordination;Endurance;GMC;UE functional use;Sensation;Fascial restriction;Decreased knowledge of precautions;Body mechanics;Flexibility;IADL;Pain;Strength;FMC;Dexterity;Edema;Mobility;ROM    Rehab Potential Good    Clinical Decision Making Limited treatment options, no task modification necessary    Comorbidities Affecting Occupational Performance: None    Modification or Assistance to Complete Evaluation  No modification of tasks or assist necessary to complete eval    OT Frequency 2x / week    OT Duration 6 weeks    OT Treatment/Interventions Self-care/ADL training;Therapeutic exercise;Patient/family education;Splinting;Neuromuscular education;Fluidtherapy;Therapeutic activities;Passive range of motion;Manual Therapy;DME and/or AE instruction;Contrast Bath;Ultrasound;Cryotherapy;Moist Heat    Plan Right arm/neck range of motion, manual - posterior shoulder    OT Home Exercise Plan hand exercises- consevative management carpal tunel    Consulted and Agree with Plan of Care Patient           Patient will benefit from skilled therapeutic intervention in order to improve the following deficits and impairments:   Body Structure / Function / Physical Skills: ADL, Coordination, Endurance, GMC, UE functional use, Sensation, Fascial restriction, Decreased knowledge of precautions, Body mechanics, Flexibility, IADL, Pain, Strength, FMC, Dexterity, Edema,  Mobility, ROM       Visit Diagnosis: Pain in right hand  Pain in left hand  Localized edema  Stiffness of left wrist, not elsewhere classified  Stiffness of right wrist, not elsewhere classified  Other disturbances of skin sensation    Problem List Patient Active Problem List   Diagnosis Date Noted  Carpal tunnel syndrome of right wrist 11/10/2019   Symptomatic cholelithiasis 06/13/2018   Postpartum care and examination 04/29/2018   Urine frequency 04/29/2018   Normal labor 03/18/2018   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 [place], [place], [address] Phone: [phone]   Fax:  [phone]  Name: Kari Ortega MRN: [id number] Date of Birth: 04-25-1981

## 2020-01-12 ENCOUNTER — Encounter: Payer: Self-pay | Admitting: Occupational Therapy

## 2020-01-12 ENCOUNTER — Ambulatory Visit: Payer: Self-pay | Attending: Physician Assistant | Admitting: Occupational Therapy

## 2020-01-12 ENCOUNTER — Other Ambulatory Visit: Payer: Self-pay

## 2020-01-12 DIAGNOSIS — M79642 Pain in left hand: Secondary | ICD-10-CM | POA: Insufficient documentation

## 2020-01-12 DIAGNOSIS — R209 Unspecified disturbances of skin sensation: Secondary | ICD-10-CM | POA: Insufficient documentation

## 2020-01-12 DIAGNOSIS — R208 Other disturbances of skin sensation: Secondary | ICD-10-CM

## 2020-01-12 DIAGNOSIS — M79641 Pain in right hand: Secondary | ICD-10-CM | POA: Insufficient documentation

## 2020-01-12 DIAGNOSIS — R600 Localized edema: Secondary | ICD-10-CM | POA: Insufficient documentation

## 2020-01-12 DIAGNOSIS — M25631 Stiffness of right wrist, not elsewhere classified: Secondary | ICD-10-CM | POA: Insufficient documentation

## 2020-01-12 DIAGNOSIS — M25632 Stiffness of left wrist, not elsewhere classified: Secondary | ICD-10-CM | POA: Insufficient documentation

## 2020-01-12 NOTE — Therapy (Signed)
[facility] [address] [place], [place], [address] Phone: [phone]   Fax:  [phone]  Occupational Therapy Treatment  Patient Details  Name: Kari Ortega MRN: [id number] Date of Birth: 1980/05/30 Referring Provider (OT): Dwana Melena   Encounter Date: 01/12/2020   OT End of Session - 01/12/20 1842    Visit Number 4    Number of Visits 13    Authorization Type self pay    OT Start Time 6606    OT Stop Time 1835    OT Time Calculation (min) 42 min    Activity Tolerance Patient tolerated treatment well    Behavior During Therapy [facility] for tasks assessed/performed           Past Medical History:  Diagnosis Date  . Anemia   . 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  Unable to determine reason per pt.    . Irregular periods     Past Surgical History:  Procedure Laterality Date  . CHOLECYSTECTOMY N/A 06/14/2018   Procedure: LAPAROSCOPIC CHOLECYSTECTOMY;  Surgeon: [name], [name], MD;  Location: [facility];  Service: General;  Laterality: N/A;  . TOOTH EXTRACTION      There were no vitals filed for this visit.   Subjective Assessment - 01/12/20 1800    Subjective  I wore the brace to show you what I wear at night    Patient is accompanied by: Interpreter   jorge [phone]   Currently in Pain? Yes    Pain Score 4     Pain Location Neck    Pain Orientation Right    Pain Descriptors / Indicators Aching    Pain Type Chronic pain    Pain Onset More than a month ago    Pain Frequency Constant    Aggravating Factors  using my hands    Pain Relieving Factors rest , tylenol    Multiple Pain Sites No                        OT Treatments/Exercises (OP) - 01/12/20 0001      Neurological Re-education Exercises   Other Exercises 1 Reviewed stretches for shoulder/elbow/neck.        Ultrasound   Ultrasound Location volar wrist, palm of right hand     Ultrasound Parameters 19mhz, pulsed, 0.8 w/cm2 x 8 min right    Ultrasound Goals Edema;Pain      Manual Therapy   Manual Therapy Soft tissue mobilization    Soft tissue mobilization Right upper trap, vertebral border of scapula.  Patient reports pain reduction after mobilization (from 4/10- 3/10)    Scapular Mobilization Right abd/add, elevation/depression                  OT Education - 01/12/20 1841    Education Details nerve glides    Person(s) Educated Patient    Methods Explanation;Demonstration;Verbal cues    Comprehension Verbalized understanding;Returned demonstration            OT Short Term Goals - 01/12/20 1843      OT SHORT TERM GOAL #1   Title Patient will complete a home exercise program designed to improve range of motion in BUE's due 01/12/20    Time 3    Period Weeks    Status Achieved    Target Date 01/12/20      OT SHORT TERM GOAL #2   Title Patient will demonstrate awareness  of edema management techniques    Time 3    Period Weeks    Status Achieved      OT SHORT TERM GOAL #3   Title Patient will demonstrate awareness of task modification to reduce symptoms    Time 3    Period Weeks    Status Achieved      OT SHORT TERM GOAL #4   Title Patient will demonstrate effective splint wear and care    Time 3    Period Weeks    Status Achieved             OT Long Term Goals - 01/12/20 1844      OT LONG TERM GOAL #1   Title Patient will complete an updated HEP to address strength and range of motion in BUE'S due 02/11/20    Time 6    Period Weeks    Status On-going      OT LONG TERM GOAL #2   Title Patient will report decreased symptoms with ADL (e.g. combing or washing hair)    Time 6    Period Weeks    Status On-going      OT LONG TERM GOAL #3   Title Patient will demonstrate 5 lb increase in grip strength in BUE to improve functional grip    Time 6    Period Weeks    Status On-going                 Plan - 01/12/20 1842     Clinical Impression Statement Patient with slow but steady improvement in functional use of BUE'S and mild improvement of symptoms.    OT Occupational Profile and History Problem Focused Assessment - Including review of records relating to presenting problem    Occupational performance deficits (Please refer to evaluation for details): ADL's;IADL's;Rest and Sleep    Body Structure / Function / Physical Skills ADL;Coordination;Endurance;GMC;UE functional use;Sensation;Fascial restriction;Decreased knowledge of precautions;Body mechanics;Flexibility;IADL;Pain;Strength;FMC;Dexterity;Edema;Mobility;ROM    Rehab Potential Good    Clinical Decision Making Limited treatment options, no task modification necessary    Comorbidities Affecting Occupational Performance: None    Modification or Assistance to Complete Evaluation  No modification of tasks or assist necessary to complete eval    OT Frequency 2x / week    OT Duration 6 weeks    OT Treatment/Interventions Self-care/ADL training;Therapeutic exercise;Patient/family education;Splinting;Neuromuscular education;Fluidtherapy;Therapeutic activities;Passive range of motion;Manual Therapy;DME and/or AE instruction;Contrast Bath;Ultrasound;Cryotherapy;Moist Heat    Plan Right arm/neck range of motion, manual - posterior shoulder    OT Home Exercise Plan hand exercises- consevative management carpal tunel    Consulted and Agree with Plan of Care Patient           Patient will benefit from skilled therapeutic intervention in order to improve the following deficits and impairments:   Body Structure / Function / Physical Skills: ADL, Coordination, Endurance, GMC, UE functional use, Sensation, Fascial restriction, Decreased knowledge of precautions, Body mechanics, Flexibility, IADL, Pain, Strength, FMC, Dexterity, Edema, Mobility, ROM       Visit Diagnosis: Pain in right hand  Pain in left hand  Localized edema  Stiffness of left wrist, not  elsewhere classified  Stiffness of right wrist, not elsewhere classified  Other disturbances of skin sensation    Problem List Patient Active Problem List   Diagnosis Date Noted  . Carpal tunnel syndrome of right wrist 11/10/2019  . Symptomatic cholelithiasis 06/13/2018  . Postpartum care and examination 04/29/2018  . Urine frequency 04/29/2018  .  Normal labor 03/18/2018  . 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 BMI 30-39.9 09/21/2017  . Obesity in pregnancy 09/21/2017  . Language barrier 09/21/2017  . Supervision of high risk pregnancy, antepartum 08/24/2017  . AMA (advanced maternal age) multigravida 35+ 08/24/2017  . HYPERTRIGLYCERIDEMIA 12/15/2006    Mariah Milling, OTR/L 01/12/2020, 6:45 PM  [facility] [address] [place], [place], [address] Phone: [phone]   Fax:  [phone]  Name: Kari Ortega MRN: [id number] Date of Birth: Nov 08, 1980

## 2020-01-18 ENCOUNTER — Other Ambulatory Visit: Payer: Self-pay

## 2020-01-18 ENCOUNTER — Ambulatory Visit: Payer: Self-pay | Admitting: Occupational Therapy

## 2020-01-18 ENCOUNTER — Encounter: Payer: Self-pay | Admitting: Occupational Therapy

## 2020-01-18 DIAGNOSIS — R208 Other disturbances of skin sensation: Secondary | ICD-10-CM

## 2020-01-18 DIAGNOSIS — M79642 Pain in left hand: Secondary | ICD-10-CM

## 2020-01-18 DIAGNOSIS — R600 Localized edema: Secondary | ICD-10-CM

## 2020-01-18 DIAGNOSIS — M79641 Pain in right hand: Secondary | ICD-10-CM

## 2020-01-18 DIAGNOSIS — M25632 Stiffness of left wrist, not elsewhere classified: Secondary | ICD-10-CM

## 2020-01-18 DIAGNOSIS — M25631 Stiffness of right wrist, not elsewhere classified: Secondary | ICD-10-CM

## 2020-01-18 NOTE — Therapy (Signed)
[facility] [address] [place], [place], [address] Phone: [phone]   Fax:  [phone]  Occupational Therapy Treatment  Patient Details  Name: Kari Ortega MRN: [id number] Date of Birth: 04-Sep-1980 Referring Provider (OT): Dwana Melena   Encounter Date: 01/18/2020   OT End of Session - 01/18/20 1710    Visit Number 5    Number of Visits 13    Authorization Type self pay    OT Start Time 1625    OT Stop Time 1700    OT Time Calculation (min) 35 min    Activity Tolerance Patient tolerated treatment well    Behavior During Therapy [facility] for tasks assessed/performed           Past Medical History:  Diagnosis Date  . Anemia   . 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  Unable to determine reason per pt.    . Irregular periods     Past Surgical History:  Procedure Laterality Date  . CHOLECYSTECTOMY N/A 06/14/2018   Procedure: LAPAROSCOPIC CHOLECYSTECTOMY;  Surgeon: [name], [name], MD;  Location: [facility];  Service: General;  Laterality: N/A;  . TOOTH EXTRACTION      There were no vitals filed for this visit.   Subjective Assessment - 01/18/20 1704    Subjective  Patient with pain in neck moreso than in shoulder today    Patient is accompanied by: Interpreter    Currently in Pain? Yes    Pain Score 6     Pain Location Neck    Pain Orientation Right    Pain Descriptors / Indicators Aching    Pain Type Chronic pain    Pain Onset More than a month ago    Pain Frequency Constant                        OT Treatments/Exercises (OP) - 01/18/20 0001      Exercises   Exercises Wrist      Wrist Exercises   Bar Weights/Barbell (Wrist Flexion) 1 lb    Bar Weights/Barbell (Wrist Extension) 1 lb    Bar Weights/Barbell (Radial Deviation) 1 lb    Bar Weights/Barbell (Ulnar Deviation) 1 lb    Other wrist exercises Forearm pron/supination with 1lb dumb bell  all 10 reps      Moist Heat Therapy   Number Minutes Moist Heat 10 Minutes    Moist Heat Location Cervical;Shoulder      Manual Therapy   Manual Therapy Soft tissue mobilization    Soft tissue mobilization Right upper trap.  Patient reports pain reduction after mobilization.  "Very little" pain after mob.  Mobilized interossei forearm, and base of wrist/palm.      Scapular Mobilization Right abd/add, elevation/depression                  OT Education - 01/18/20 1709    Education Details 1 lb weight (or water bottle/can) for wrist flex/ext, forearm supination/pronation    Person(s) Educated Patient    Methods Explanation;Demonstration;Verbal cues    Comprehension Verbalized understanding;Returned demonstration            OT Short Term Goals - 01/18/20 1710      OT SHORT TERM GOAL #1   Title Patient will complete a home exercise program designed to improve range of motion in BUE's due 01/12/20    Time 3    Period Weeks    Status  Achieved    Target Date 01/12/20      OT SHORT TERM GOAL #2   Title Patient will demonstrate awareness of edema management techniques    Time 3    Period Weeks    Status Achieved      OT SHORT TERM GOAL #3   Title Patient will demonstrate awareness of task modification to reduce symptoms    Time 3    Period Weeks    Status Achieved      OT SHORT TERM GOAL #4   Title Patient will demonstrate effective splint wear and care    Time 3    Period Weeks    Status Achieved             OT Long Term Goals - 01/18/20 1711      OT LONG TERM GOAL #1   Title Patient will complete an updated HEP to address strength and range of motion in BUE'S due 02/11/20    Time 6    Period Weeks    Status On-going      OT LONG TERM GOAL #2   Title Patient will report decreased symptoms with ADL (e.g. combing or washing hair)    Time 6    Period Weeks    Status On-going      OT LONG TERM GOAL #3   Title Patient will demonstrate 5 lb increase in grip  strength in BUE to improve functional grip    Time 6    Period Weeks    Status On-going                 Plan - 01/18/20 1710    Clinical Impression Statement Patient with slow but steady improvement in functional use of BUE'S and mild improvement of symptoms.    OT Occupational Profile and History Problem Focused Assessment - Including review of records relating to presenting problem    Occupational performance deficits (Please refer to evaluation for details): ADL's;IADL's;Rest and Sleep    Body Structure / Function / Physical Skills ADL;Coordination;Endurance;GMC;UE functional use;Sensation;Fascial restriction;Decreased knowledge of precautions;Body mechanics;Flexibility;IADL;Pain;Strength;FMC;Dexterity;Edema;Mobility;ROM    Rehab Potential Good    Clinical Decision Making Limited treatment options, no task modification necessary    Comorbidities Affecting Occupational Performance: None    Modification or Assistance to Complete Evaluation  No modification of tasks or assist necessary to complete eval    OT Frequency 2x / week    OT Duration 6 weeks    OT Treatment/Interventions Self-care/ADL training;Therapeutic exercise;Patient/family education;Splinting;Neuromuscular education;Fluidtherapy;Therapeutic activities;Passive range of motion;Manual Therapy;DME and/or AE instruction;Contrast Bath;Ultrasound;Cryotherapy;Moist Heat    Plan Right arm/neck range of motion, manual - posterior shoulder    OT Home Exercise Plan hand exercises- consevative management carpal tunel    Consulted and Agree with Plan of Care Patient           Patient will benefit from skilled therapeutic intervention in order to improve the following deficits and impairments:   Body Structure / Function / Physical Skills: ADL, Coordination, Endurance, GMC, UE functional use, Sensation, Fascial restriction, Decreased knowledge of precautions, Body mechanics, Flexibility, IADL, Pain, Strength, FMC, Dexterity, Edema,  Mobility, ROM       Visit Diagnosis: Pain in right hand  Pain in left hand  Localized edema  Stiffness of left wrist, not elsewhere classified  Stiffness of right wrist, not elsewhere classified  Other disturbances of skin sensation    Problem List Patient Active Problem List   Diagnosis Date Noted  . Carpal tunnel  syndrome of right wrist 11/10/2019  . Symptomatic cholelithiasis 06/13/2018  . Postpartum care and examination 04/29/2018  . Urine frequency 04/29/2018  . Normal labor 03/18/2018  . 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 BMI 30-39.9 09/21/2017  . Obesity in pregnancy 09/21/2017  . Language barrier 09/21/2017  . Supervision of high risk pregnancy, antepartum 08/24/2017  . AMA (advanced maternal age) multigravida 35+ 08/24/2017  . HYPERTRIGLYCERIDEMIA 12/15/2006    Mariah Milling, OTR/L 01/18/2020, 5:12 PM  [facility] [address] [place], [place], [address] Phone: [phone]   Fax:  [phone]  Name: Kari Ortega MRN: [id number] Date of Birth: [birth year]

## 2020-01-20 ENCOUNTER — Telehealth: Payer: Self-pay | Admitting: Family Medicine

## 2020-01-20 NOTE — Telephone Encounter (Signed)
Notify patient that her US showed that the lining of her uterus is thickened and the recommendation is for her to have a repeat US the week after her next menses so she needs to call here or OB/GYN if she was referred as soon as she starts her next menses so that her repeat US can be ordered

## 2020-01-20 NOTE — Telephone Encounter (Signed)
Copied from [facility] [id number]. Topic: General - Other >> Jan 20, 2020  2:24 PM Hinda Lenis D wrote: Reason for CRM: PT need to go over her ultrasound with a medical team / please advise

## 2020-01-20 NOTE — Telephone Encounter (Signed)
UTC pt w/ interpreter due to [employer] phone # not working, will follow-up.

## 2020-01-23 ENCOUNTER — Other Ambulatory Visit: Payer: Self-pay | Admitting: Family Medicine

## 2020-01-23 DIAGNOSIS — R9389 Abnormal findings on diagnostic imaging of other specified body structures: Secondary | ICD-10-CM

## 2020-01-23 NOTE — Telephone Encounter (Signed)
Spoke w/ pt via [employer] language services [place], [phone]) and informed of result/recommendations. Pt reports that she ended last period on 01/11/20 and wants to know if order can be placed for her now or does she need to call back at the start of next period, pls advise, thanks.

## 2020-01-23 NOTE — Telephone Encounter (Signed)
Please notify patient that per radiology, the repeat US needs to be done in the week immediately after her menstrual period ends therefore a new order was placed for her to have her US done during the first week of October but she should contact the office if her menses occurs sooner or later than expected so that the [place] can be changed if needed.

## 2020-01-23 NOTE — Progress Notes (Signed)
Patient ID: Kari Ortega, female   DOB: 11/02/1980, 39 y.o.   MRN: [id number]   Patient needs pelvic US in follow-up of thickened endometrium. Per radiology recommendations, she needs repeat US in 6-8 weeks during the week immediately  following her menses. Patient was able to be contacted and her menses ended on 01/11/2020 therefore will send in order for [place] to be done during the first week of October and patient should call if her menses is earlier or later than expected.

## 2020-01-24 NOTE — Progress Notes (Signed)
Patient has been informed of ultrasound and next ultrasound.

## 2020-01-24 NOTE — Telephone Encounter (Signed)
Call to pt via [employer] Mickel Baas, [phone]), pt aware of instructions and voiced understanding.

## 2020-01-25 ENCOUNTER — Encounter: Payer: Self-pay | Admitting: Occupational Therapy

## 2020-01-25 ENCOUNTER — Ambulatory Visit: Payer: Self-pay | Admitting: Occupational Therapy

## 2020-01-25 ENCOUNTER — Other Ambulatory Visit: Payer: Self-pay

## 2020-01-25 DIAGNOSIS — R600 Localized edema: Secondary | ICD-10-CM

## 2020-01-25 DIAGNOSIS — M25632 Stiffness of left wrist, not elsewhere classified: Secondary | ICD-10-CM

## 2020-01-25 DIAGNOSIS — M79642 Pain in left hand: Secondary | ICD-10-CM

## 2020-01-25 DIAGNOSIS — M79641 Pain in right hand: Secondary | ICD-10-CM

## 2020-01-25 DIAGNOSIS — R208 Other disturbances of skin sensation: Secondary | ICD-10-CM

## 2020-01-25 DIAGNOSIS — M25631 Stiffness of right wrist, not elsewhere classified: Secondary | ICD-10-CM

## 2020-01-25 NOTE — Patient Instructions (Signed)
Putty-in-Your-Hand    Apriete lentamente una masilla o pelota de goma blanda mientras respira normalmente. Repita con la otra mano. Repita la serie 10 veces. Haga 2 Pinch: Palmar    Pellizque la masilla con el pulgar derecho y la punta de cada uno de los dedos. Repita 10 veces. Haga 2 sesiones por Training and development officer. Actividad: Pele frutas como naranjas o limones.* Despegue calcomanas de una superficie.  Copyright  VHI. All rights reserved.  Finger / Thumb Activities: Extension    Amase la masilla dndole forma de soga utilizando todos los dedos extendidos. Simule "hacer dedo" moviendo el pulgar hacia arriba y [place].  Copyright  VHI. All rights reserved.   sesiones por Training and development officer.  http://gt2.exer.us/885   Copyright  VHI. All rights reserved.

## 2020-01-25 NOTE — Therapy (Signed)
[facility] [address] [place], [place], [address] Phone: [phone]   Fax:  [phone]  Occupational Therapy Treatment  Patient Details  Name: Kari Ortega MRN: [id number] Date of Birth: July 14, 1980 Referring Provider (OT): Dwana Melena   Encounter Date: 01/25/2020   OT End of Session - 01/25/20 1703    Visit Number 6    Number of Visits 13    Authorization Type self pay    OT Start Time 2229    OT Stop Time 1658    OT Time Calculation (min) 41 min    Activity Tolerance Patient tolerated treatment well    Behavior During Therapy [facility] for tasks assessed/performed           Past Medical History:  Diagnosis Date  . Anemia   . 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  Unable to determine reason per pt.    . Irregular periods     Past Surgical History:  Procedure Laterality Date  . CHOLECYSTECTOMY N/A 06/14/2018   Procedure: LAPAROSCOPIC CHOLECYSTECTOMY;  Surgeon: [name], [name], MD;  Location: [facility];  Service: General;  Laterality: N/A;  . TOOTH EXTRACTION      There were no vitals filed for this visit.   Subjective Assessment - 01/25/20 1624    Subjective  Patient now reporting pain in back    Patient is accompanied by: Interpreter   Josue [phone]   Currently in Pain? Yes    Pain Score 4     Pain Location Neck    Pain Orientation Right    Pain Descriptors / Indicators Aching    Pain Type Chronic pain    Pain Onset More than a month ago    Pain Frequency Constant    Aggravating Factors  using my hands    Pain Relieving Factors rest tylenol              [facility] OT Assessment - 01/25/20 0001      Hand Function   Right Hand Grip (lbs) 40.1    Left Hand Grip (lbs) 46.7                    OT Treatments/Exercises (OP) - 01/25/20 0001      Theraputty   Theraputty - Roll red    Theraputty - Grip red    Theraputty - Pinch red      Moist  Heat Therapy   Number Minutes Moist Heat 10 Minutes    Moist Heat Location Shoulder      Manual Therapy   Manual Therapy Soft tissue mobilization    Soft tissue mobilization Right upper trap.  Patient reports pain reduction after mobilization.  "Very little" pain after mob.  Mobilized interossei forearm, and base of wrist/palm.                    OT Education - 01/25/20 1703    Education Details theraputty exercises - grip, roll pinch with red.  Patient witrh soreness in right arm with squeezing - encouraged to apply less pressure for RUE    Person(s) Educated Patient    Methods Explanation;Demonstration;Handout    Comprehension Verbalized understanding;Returned demonstration            OT Short Term Goals - 01/25/20 1628      OT SHORT TERM GOAL #1   Title Patient will complete a home exercise program designed to improve range of motion  in [place] due 01/12/20    Time 3    Period Weeks    Status Achieved    Target Date 01/12/20      OT SHORT TERM GOAL #2   Title Patient will demonstrate awareness of edema management techniques    Time 3    Period Weeks    Status Achieved      OT SHORT TERM GOAL #3   Title Patient will demonstrate awareness of task modification to reduce symptoms    Time 3    Period Weeks    Status Achieved      OT SHORT TERM GOAL #4   Title Patient will demonstrate effective splint wear and care    Time 3    Period Weeks    Status Achieved             OT Long Term Goals - 01/25/20 1628      OT LONG TERM GOAL #1   Title Patient will complete an updated HEP to address strength and range of motion in BUE'S due 02/11/20    Time 6    Period Weeks    Status On-going      OT LONG TERM GOAL #2   Title Patient will report decreased symptoms with ADL (e.g. combing or washing hair)    Time 6    Period Weeks    Status Achieved      OT LONG TERM GOAL #3   Title Patient will demonstrate 5 lb increase in grip strength in BUE to improve  functional grip    Time 6    Period Weeks    Status On-going                 Plan - 01/25/20 1704    Clinical Impression Statement Patient with slow but steady improvement in functional use of BUE'S and mild improvement of symptoms.    OT Occupational Profile and History Problem Focused Assessment - Including review of records relating to presenting problem    Occupational performance deficits (Please refer to evaluation for details): ADL's;IADL's;Rest and Sleep    Body Structure / Function / Physical Skills ADL;Coordination;Endurance;GMC;UE functional use;Sensation;Fascial restriction;Decreased knowledge of precautions;Body mechanics;Flexibility;IADL;Pain;Strength;FMC;Dexterity;Edema;Mobility;ROM    Rehab Potential Good    Clinical Decision Making Limited treatment options, no task modification necessary    Comorbidities Affecting Occupational Performance: None    Modification or Assistance to Complete Evaluation  No modification of tasks or assist necessary to complete eval    OT Frequency 2x / week    OT Duration 6 weeks    OT Treatment/Interventions Self-care/ADL training;Therapeutic exercise;Patient/family education;Splinting;Neuromuscular education;Fluidtherapy;Therapeutic activities;Passive range of motion;Manual Therapy;DME and/or AE instruction;Contrast Bath;Ultrasound;Cryotherapy;Moist Heat    Plan Check HEP putty- Needs emphasis on functional use of BUE without exaggerating symptoms.  Right arm/neck range of motion, manual - posterior shoulder    OT Home Exercise Plan hand exercises- consevative management carpal tunel    Consulted and Agree with Plan of Care Patient           Patient will benefit from skilled therapeutic intervention in order to improve the following deficits and impairments:   Body Structure / Function / Physical Skills: ADL, Coordination, Endurance, GMC, UE functional use, Sensation, Fascial restriction, Decreased knowledge of precautions, Body  mechanics, Flexibility, IADL, Pain, Strength, FMC, Dexterity, Edema, Mobility, ROM       Visit Diagnosis: Pain in right hand  Pain in left hand  Localized edema  Stiffness of left wrist, not elsewhere classified  Stiffness of right wrist, not elsewhere classified  Other disturbances of skin sensation    Problem List Patient Active Problem List   Diagnosis Date Noted  . Carpal tunnel syndrome of right wrist 11/10/2019  . Symptomatic cholelithiasis 06/13/2018  . Postpartum care and examination 04/29/2018  . Urine frequency 04/29/2018  . Normal labor 03/18/2018  . 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 BMI 30-39.9 09/21/2017  . Obesity in pregnancy 09/21/2017  . Language barrier 09/21/2017  . Supervision of high risk pregnancy, antepartum 08/24/2017  . AMA (advanced maternal age) multigravida 35+ 08/24/2017  . HYPERTRIGLYCERIDEMIA 12/15/2006    Mariah Milling, OTR/L 01/25/2020, 5:08 PM  [facility] [address] [place], [place], [address] Phone: [phone]   Fax:  [phone]  Name: Kari Ortega MRN: [id number] Date of Birth: 1980-05-26

## 2020-01-27 ENCOUNTER — Ambulatory Visit (HOSPITAL_COMMUNITY): Payer: Self-pay

## 2020-01-30 ENCOUNTER — Ambulatory Visit: Payer: Self-pay | Admitting: Occupational Therapy

## 2020-01-31 ENCOUNTER — Other Ambulatory Visit: Payer: Self-pay

## 2020-01-31 ENCOUNTER — Ambulatory Visit: Payer: Self-pay | Admitting: Occupational Therapy

## 2020-01-31 ENCOUNTER — Encounter: Payer: Self-pay | Admitting: Occupational Therapy

## 2020-01-31 DIAGNOSIS — M79642 Pain in left hand: Secondary | ICD-10-CM

## 2020-01-31 DIAGNOSIS — R600 Localized edema: Secondary | ICD-10-CM

## 2020-01-31 DIAGNOSIS — M79641 Pain in right hand: Secondary | ICD-10-CM

## 2020-01-31 DIAGNOSIS — R208 Other disturbances of skin sensation: Secondary | ICD-10-CM

## 2020-01-31 DIAGNOSIS — M25631 Stiffness of right wrist, not elsewhere classified: Secondary | ICD-10-CM

## 2020-01-31 DIAGNOSIS — M25632 Stiffness of left wrist, not elsewhere classified: Secondary | ICD-10-CM

## 2020-01-31 NOTE — Therapy (Signed)
[facility] [address] [place], [place], [address] Phone: [phone]   Fax:  [phone]  Occupational Therapy Treatment  Patient Details  Name: Kari Ortega MRN: [id number] Date of Birth: [birth year] Referring Provider (OT): Dwana Melena   Encounter Date: 01/31/2020   OT End of Session - 01/31/20 1623    Visit Number 7    Number of Visits 13    Authorization Type self pay    OT Start Time 4270    OT Stop Time 1615    OT Time Calculation (min) 43 min    Activity Tolerance Patient tolerated treatment well    Behavior During Therapy [facility] for tasks assessed/performed           Past Medical History:  Diagnosis Date  . Anemia   . 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  Unable to determine reason per pt.    . Irregular periods     Past Surgical History:  Procedure Laterality Date  . CHOLECYSTECTOMY N/A 06/14/2018   Procedure: LAPAROSCOPIC CHOLECYSTECTOMY;  Surgeon: [name], [name], MD;  Location: [facility];  Service: General;  Laterality: N/A;  . TOOTH EXTRACTION      There were no vitals filed for this visit.   Subjective Assessment - 01/31/20 1537    Subjective  Patient reports that pain in neck is at #4, and two days ago hands were hurting real bad.  (7/10)    Patient is accompanied by: Interpreter   Geralyn Flash [phone]   Currently in Pain? Yes    Pain Score 4     Pain Location Neck    Pain Orientation Right    Pain Descriptors / Indicators Aching    Pain Type Chronic pain    Pain Onset More than a month ago    Pain Frequency Constant    Aggravating Factors  Using my hands    Pain Relieving Factors tylenol    Multiple Pain Sites Yes    Pain Score 4    Pain Location Hand    Pain Orientation Right;Left    Pain Descriptors / Indicators Aching    Pain Type Acute pain    Pain Onset In the past 7 days    Pain Frequency Intermittent    Aggravating Factors  cleaning     Pain Relieving Factors tylenol                        OT Treatments/Exercises (OP) - 01/31/20 0001      Exercises   Exercises Hand      Hand Exercises   Hand Gripper with Large Beads 5 blocks at a time alternating hands on second setting.  Patient with minimal increase in pain with activity - but resolved during rest.      Other Hand Exercises Grip strength 40lbs R/L.  Patient indicates she is doing putty exercises at home.        Moist Heat Therapy   Number Minutes Moist Heat 10 Minutes    Moist Heat Location Cervical;Shoulder      Ultrasound   Ultrasound Location volar wrist, palm    Ultrasound Parameters 31mhz, pulsed 20%, 0.8 w/cm2, 35min left, 8 min right    Ultrasound Goals Edema;Pain   Pain resolved from 4/10 to 0/10                   OT Short Term Goals - 01/31/20 1626  OT SHORT TERM GOAL #1   Title Patient will complete a home exercise program designed to improve range of motion in BUE's due 01/12/20    Time 3    Period Weeks    Status Achieved    Target Date 01/12/20      OT SHORT TERM GOAL #2   Title Patient will demonstrate awareness of edema management techniques    Time 3    Period Weeks    Status Achieved      OT SHORT TERM GOAL #3   Title Patient will demonstrate awareness of task modification to reduce symptoms    Time 3    Period Weeks    Status Achieved      OT SHORT TERM GOAL #4   Title Patient will demonstrate effective splint wear and care    Time 3    Period Weeks    Status Achieved             OT Long Term Goals - 01/31/20 1627      OT LONG TERM GOAL #1   Title Patient will complete an updated HEP to address strength and range of motion in BUE'S due 02/11/20    Time 6    Period Weeks    Status On-going      OT LONG TERM GOAL #2   Title Patient will report decreased symptoms with ADL (e.g. combing or washing hair)    Time 6    Period Weeks    Status Achieved      OT LONG TERM GOAL #3   Title  Patient will demonstrate 5 lb increase in grip strength in BUE to improve functional grip    Time 6    Period Weeks    Status On-going                 Plan - 01/31/20 1624    Clinical Impression Statement Patient with consistent report of neck pain.  Have encouraged her to follow up with MD to determine if neck issue is realted to hand numbness.  Patient has young children at home, one of which she is stil breastfeeding.    OT Occupational Profile and History Problem Focused Assessment - Including review of records relating to presenting problem    Occupational performance deficits (Please refer to evaluation for details): ADL's;IADL's;Rest and Sleep    Body Structure / Function / Physical Skills ADL;Coordination;Endurance;GMC;UE functional use;Sensation;Fascial restriction;Decreased knowledge of precautions;Body mechanics;Flexibility;IADL;Pain;Strength;FMC;Dexterity;Edema;Mobility;ROM    Rehab Potential Good    Clinical Decision Making Limited treatment options, no task modification necessary    Comorbidities Affecting Occupational Performance: None    Modification or Assistance to Complete Evaluation  No modification of tasks or assist necessary to complete eval    OT Frequency 2x / week    OT Duration 6 weeks    OT Treatment/Interventions Self-care/ADL training;Therapeutic exercise;Patient/family education;Splinting;Neuromuscular education;Fluidtherapy;Therapeutic activities;Passive range of motion;Manual Therapy;DME and/or AE instruction;Contrast Bath;Ultrasound;Cryotherapy;Moist Heat    Plan Symptom management, gentle strengthening, work toward remaining goals    OT Home Exercise Plan hand exercises- consevative management carpal tunel    Consulted and Agree with Plan of Care Patient           Patient will benefit from skilled therapeutic intervention in order to improve the following deficits and impairments:   Body Structure / Function / Physical Skills: ADL, Coordination,  Endurance, GMC, UE functional use, Sensation, Fascial restriction, Decreased knowledge of precautions, Body mechanics, Flexibility, IADL, Pain, Strength, FMC, Dexterity, Edema,  Mobility, ROM       Visit Diagnosis: Pain in right hand  Pain in left hand  Localized edema  Stiffness of left wrist, not elsewhere classified  Stiffness of right wrist, not elsewhere classified  Other disturbances of skin sensation    Problem List Patient Active Problem List   Diagnosis Date Noted  . Carpal tunnel syndrome of right wrist 11/10/2019  . Symptomatic cholelithiasis 06/13/2018  . Postpartum care and examination 04/29/2018  . Urine frequency 04/29/2018  . Normal labor 03/18/2018  . 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 BMI 30-39.9 09/21/2017  . Obesity in pregnancy 09/21/2017  . Language barrier 09/21/2017  . Supervision of high risk pregnancy, antepartum 08/24/2017  . AMA (advanced maternal age) multigravida 35+ 08/24/2017  . HYPERTRIGLYCERIDEMIA 12/15/2006    Mariah Milling, OTR/L 01/31/2020, 4:28 PM  [facility] [address] [place], [place], [address] Phone: [phone]   Fax:  [phone]  Name: Kari Ortega MRN: [id number] Date of Birth: August 29, 1980

## 2020-02-07 ENCOUNTER — Ambulatory Visit: Payer: Self-pay | Admitting: Occupational Therapy

## 2020-02-07 ENCOUNTER — Ambulatory Visit (INDEPENDENT_AMBULATORY_CARE_PROVIDER_SITE_OTHER): Payer: Self-pay | Admitting: Orthopaedic Surgery

## 2020-02-07 ENCOUNTER — Other Ambulatory Visit: Payer: Self-pay

## 2020-02-07 ENCOUNTER — Encounter: Payer: Self-pay | Admitting: Occupational Therapy

## 2020-02-07 ENCOUNTER — Encounter: Payer: Self-pay | Admitting: Physician Assistant

## 2020-02-07 DIAGNOSIS — R600 Localized edema: Secondary | ICD-10-CM

## 2020-02-07 DIAGNOSIS — M79641 Pain in right hand: Secondary | ICD-10-CM

## 2020-02-07 DIAGNOSIS — M5412 Radiculopathy, cervical region: Secondary | ICD-10-CM

## 2020-02-07 DIAGNOSIS — M25631 Stiffness of right wrist, not elsewhere classified: Secondary | ICD-10-CM

## 2020-02-07 DIAGNOSIS — M79642 Pain in left hand: Secondary | ICD-10-CM

## 2020-02-07 DIAGNOSIS — M25632 Stiffness of left wrist, not elsewhere classified: Secondary | ICD-10-CM

## 2020-02-07 NOTE — Therapy (Signed)
[facility] [address] [place], [place], [address] Phone: [phone]   Fax:  [phone]  Occupational Therapy Treatment  Patient Details  Name: Kari Ortega MRN: [id number] Date of Birth: October 24, 1980 Referring Provider (OT): Dwana Melena   Encounter Date: 02/07/2020    Past Medical History:  Diagnosis Date  . Anemia   . 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  Unable to determine reason per pt.    . Irregular periods     Past Surgical History:  Procedure Laterality Date  . CHOLECYSTECTOMY N/A 06/14/2018   Procedure: LAPAROSCOPIC CHOLECYSTECTOMY;  Surgeon: [name], [name], MD;  Location: [facility];  Service: General;  Laterality: N/A;  . TOOTH EXTRACTION      There were no vitals filed for this visit.   Subjective Assessment - 02/07/20 1411    Subjective  Patient continues to report numbness constant in the 3rd digit on the right hand. Patient reports pain in the BUE hands and at the right side of the neck radiating towards the back.    Patient is accompanied by: Interpreter   Geralyn Flash [phone]   Currently in Pain? Yes    Pain Score 3     Pain Location Hand    Pain Orientation Right;Left    Pain Descriptors / Indicators Aching;Dull    Pain Type Chronic pain    Pain Onset More than a month ago    Pain Frequency Constant    Aggravating Factors  cook something and peel something    Pain Relieving Factors moving hand around    Pain Score 6    Pain Location Neck    Pain Orientation Right    Pain Type Chronic pain    Pain Radiating Towards down the back    Pain Onset More than a month ago               Interpreter: Fabio Bering #[id number]  Treatment:    OT Treatments/Exercises (OP) - 02/07/20 1418      Wrist Exercises   Other wrist exercises AROM BUE x 10 wrist flexion, extension, UD/RD Strengthening 1 lb x 10 reps    Other wrist exercises supinator/pronator  wheel, forearm gym      Neurological Re-education Exercises   Hand Gripper with Large Beads BUE gripper with 2nd setting with 1 inch blocks              OT Short Term Goals - 01/31/20 1626      OT SHORT TERM GOAL #1   Title Patient will complete a home exercise program designed to improve range of motion in BUE's due 01/12/20    Time 3    Period Weeks    Status Achieved    Target Date 01/12/20      OT SHORT TERM GOAL #2   Title Patient will demonstrate awareness of edema management techniques    Time 3    Period Weeks    Status Achieved      OT SHORT TERM GOAL #3   Title Patient will demonstrate awareness of task modification to reduce symptoms    Time 3    Period Weeks    Status Achieved      OT SHORT TERM GOAL #4   Title Patient will demonstrate effective splint wear and care    Time 3    Period Weeks    Status Achieved  OT Long Term Goals - 01/31/20 1627      OT LONG TERM GOAL #1   Title Patient will complete an updated HEP to address strength and range of motion in BUE'S due 02/11/20    Time 6    Period Weeks    Status On-going      OT LONG TERM GOAL #2   Title Patient will report decreased symptoms with ADL (e.g. combing or washing hair)    Time 6    Period Weeks    Status Achieved      OT LONG TERM GOAL #3   Title Patient will demonstrate 5 lb increase in grip strength in BUE to improve functional grip    Time 6    Period Weeks    Status On-going                 Plan - 02/07/20 1414    Clinical Impression Statement Patient is going to MD tomorrow to follow up about neck pain. Patient reports decrease in numbness since starting therapy but continues to present with numbness and pain in BUE hands.    OT Occupational Profile and History Problem Focused Assessment - Including review of records relating to presenting problem    Occupational performance deficits (Please refer to evaluation for details): ADL's;IADL's;Rest and Sleep     Body Structure / Function / Physical Skills ADL;Coordination;Endurance;GMC;UE functional use;Sensation;Fascial restriction;Decreased knowledge of precautions;Body mechanics;Flexibility;IADL;Pain;Strength;FMC;Dexterity;Edema;Mobility;ROM    Rehab Potential Good    Clinical Decision Making Limited treatment options, no task modification necessary    Comorbidities Affecting Occupational Performance: None    Modification or Assistance to Complete Evaluation  No modification of tasks or assist necessary to complete eval    OT Frequency 2x / week    OT Duration 6 weeks    OT Treatment/Interventions Self-care/ADL training;Therapeutic exercise;Patient/family education;Splinting;Neuromuscular education;Fluidtherapy;Therapeutic activities;Passive range of motion;Manual Therapy;DME and/or AE instruction;Contrast Bath;Ultrasound;Cryotherapy;Moist Heat    Plan Symptom management, gentle strengthening, work toward remaining goals    OT Home Exercise Plan hand exercises- consevative management carpal tunel    Consulted and Agree with Plan of Care Patient           Patient will benefit from skilled therapeutic intervention in order to improve the following deficits and impairments:   Body Structure / Function / Physical Skills: ADL, Coordination, Endurance, GMC, UE functional use, Sensation, Fascial restriction, Decreased knowledge of precautions, Body mechanics, Flexibility, IADL, Pain, Strength, FMC, Dexterity, Edema, Mobility, ROM       Visit Diagnosis: Pain in right hand  Pain in left hand  Localized edema  Stiffness of left wrist, not elsewhere classified  Stiffness of right wrist, not elsewhere classified    Problem List Patient Active Problem List   Diagnosis Date Noted  . Carpal tunnel syndrome of right wrist 11/10/2019  . Symptomatic cholelithiasis 06/13/2018  . Postpartum care and examination 04/29/2018  . Urine frequency 04/29/2018  . Normal labor 03/18/2018  . Paralysis of the  face 03/01/2018  . BMI 30-39.9 09/21/2017  . Obesity in pregnancy 09/21/2017  . Language barrier 09/21/2017  . Supervision of high risk pregnancy, antepartum 08/24/2017  . AMA (advanced maternal age) multigravida 35+ 08/24/2017  . HYPERTRIGLYCERIDEMIA 12/15/2006    Zachery Conch MOT, OTR/L  02/07/2020, 2:54 PM  [facility] [address] [place], [place], [address] Phone: [phone]   Fax:  [phone]  Name: Banessa Mao MRN: [id number] Date of Birth: April 25, 1981

## 2020-02-07 NOTE — Progress Notes (Signed)
Office Visit Note   Patient: Kari Ortega           Date of Birth: 1980/07/25           MRN: [id number] Visit Date: 02/07/2020              Requested by: Antony Blackbird, MD [place],  [place] [address] PCP: Antony Blackbird, MD   Assessment & Plan: Visit Diagnoses:  1. Radiculopathy of cervical spine     Plan: Impression is chronic neck pain with bilateral upper extremity radiculopathy right greater than left with underlying carpal tunnel syndrome both sides.  We have discussed steroid taper and muscle relaxer as well as physical therapy.  The patient would only like to proceed with physical therapy.  New prescription was provided today.  She will follow-up with [place] as needed.  Follow-Up Instructions: Return if symptoms worsen or fail to improve.   Orders:  No orders of the defined types were placed in this encounter.  No orders of the defined types were placed in this encounter.     Procedures: No procedures performed   Clinical Data: No additional findings.   Subjective: Chief Complaint  Patient presents with   Lower Back - Pain   Neck - Pain    HPI patient is a very pleasant 39 year old Spanish-speaking female who comes in today with an interpreter.  She is here with neck pain and bilateral upper extremity radiculopathy right greater than left.  She has been dealing with this for a while.  The majority of her pain actually starts at the base of her neck and radiates into the parascapular regions.  She has paresthesias to both hands particularly the thumb, index and long fingers.  She does have known carpal tunnel syndrome both sides with the moderate to severe severity on the right moderate on the left.  She has been in physical therapy for this which has helped all of her fingers except for the right long finger.  She has been taking Tylenol without significant relief of symptoms.  She has not been to physical therapy for her neck.  No  previous ESI injections either.  Review of Systems as detailed in HPI.  All others reviewed and are negative.   Objective: Vital Signs: There were no vitals taken for this visit.  Physical Exam well-developed well-nourished female no acute distress.  Alert oriented x3.  Ortho Exam examination of her cervical spine reveals mild tenderness to the para spinous region on the right greater than left.  Moderate tenderness to the right parascapular region.  Increased pain with cervical spine flexion.  No focal weakness.  She is neurovascular intact distally.  Specialty Comments:  No specialty comments available.  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  Unable to determine reason per pt.     Irregular  periods     Family History  Problem Relation Age of Onset   Diabetes Mother     Past Surgical History:  Procedure Laterality Date   CHOLECYSTECTOMY N/A 06/14/2018   Procedure: LAPAROSCOPIC CHOLECYSTECTOMY;  Surgeon: [name], [name], MD;  Location: [facility];  Service: General;  Laterality: N/A;   TOOTH EXTRACTION     Social History   Occupational History   Not on file  Tobacco Use   Smoking status: Never Smoker   Smokeless tobacco: Never Used  Vaping Use   Vaping Use: Never used  Substance and Sexual Activity   Alcohol  use: No   Drug use: No   Sexual activity: Yes    Birth control/protection: None

## 2020-02-08 ENCOUNTER — Encounter: Payer: Self-pay | Admitting: Occupational Therapy

## 2020-02-08 ENCOUNTER — Ambulatory Visit: Payer: Self-pay | Admitting: Occupational Therapy

## 2020-02-08 DIAGNOSIS — R208 Other disturbances of skin sensation: Secondary | ICD-10-CM

## 2020-02-08 DIAGNOSIS — M79641 Pain in right hand: Secondary | ICD-10-CM

## 2020-02-08 DIAGNOSIS — R600 Localized edema: Secondary | ICD-10-CM

## 2020-02-08 DIAGNOSIS — M25631 Stiffness of right wrist, not elsewhere classified: Secondary | ICD-10-CM

## 2020-02-08 DIAGNOSIS — M79642 Pain in left hand: Secondary | ICD-10-CM

## 2020-02-08 DIAGNOSIS — M25632 Stiffness of left wrist, not elsewhere classified: Secondary | ICD-10-CM

## 2020-02-08 NOTE — Therapy (Signed)
[facility] [address] [place], [place], [address] Phone: [phone]   Fax:  [phone]  Occupational Therapy Treatment  Patient Details  Name: Kari Ortega MRN: [id number] Date of Birth: [birth year] Referring Provider (OT): Dwana Melena   Encounter Date: 02/08/2020   OT End of Session - 02/08/20 1731    Visit Number 9    Number of Visits 13    Authorization Type self pay    OT Start Time 1618    OT Stop Time 1708    OT Time Calculation (min) 50 min    Activity Tolerance Patient tolerated treatment well    Behavior During Therapy [facility] for tasks assessed/performed           Past Medical History:  Diagnosis Date   Anemia    Facial paralysis    c/o facial paralysis with last deliveries.  Unable to determine reason per pt.     Irregular periods     Past Surgical History:  Procedure Laterality Date   CHOLECYSTECTOMY N/A 06/14/2018   Procedure: LAPAROSCOPIC CHOLECYSTECTOMY;  Surgeon: [name], [name], MD;  Location: [facility];  Service: General;  Laterality: N/A;   TOOTH EXTRACTION      There were no vitals filed for this visit.   Subjective Assessment - 02/08/20 1719    Subjective  Patient followed with doctor about neck pain as we discussed.  She now has orders for PT for cervical radiculopathy.  Working to set her up in clinic where she had PT prior - and was very pleased with results.    Patient is accompanied by: Interpreter    Currently in Pain? Yes    Pain Score 2     Pain Location Hand    Pain Orientation Right;Left    Pain Descriptors / Indicators Aching;Numbness    Pain Type Chronic pain    Pain Onset More than a month ago    Pain Frequency Constant    Aggravating Factors  heat, heavy work    Pain Relieving Factors rest, massage, gentle movement    Multiple Pain Sites Yes    Pain Score 6    Pain Location Neck    Pain Orientation Right    Pain Descriptors / Indicators  Aching    Pain Type Chronic pain    Pain Radiating Towards down her back    Pain Onset More than a month ago    Pain Frequency Intermittent    Aggravating Factors  carrying baby, housekeeping    Pain Relieving Factors tylenol                        OT Treatments/Exercises (OP) - 02/08/20 0001      ADLs   ADL Comments Advised patient to seek medical attention for persistent neck pain.  This is now her primary concern.  Discussed the benefit of PT to address her cervical and thoracic concerns.  Discussed that her situation is challenging as she is a busy mom with young children, and she is frequently carrying a toddler - so little opportunity to rest arms/back.       Moist Heat Therapy   Number Minutes Moist Heat 10 Minutes    Moist Heat Location Cervical;Shoulder      Manual Therapy   Manual Therapy Soft tissue mobilization    Soft tissue mobilization Vertebral border scap, along spine of scap - R - reduction in pain  OT Short Term Goals - 02/08/20 1733      OT SHORT TERM GOAL #1   Title Patient will complete a home exercise program designed to improve range of motion in BUE's due 01/12/20    Time 3    Period Weeks    Status Achieved    Target Date 01/12/20      OT SHORT TERM GOAL #2   Title Patient will demonstrate awareness of edema management techniques    Time 3    Period Weeks    Status Achieved      OT SHORT TERM GOAL #3   Title Patient will demonstrate awareness of task modification to reduce symptoms    Time 3    Period Weeks    Status Achieved      OT SHORT TERM GOAL #4   Title Patient will demonstrate effective splint wear and care    Time 3    Period Weeks    Status Achieved             OT Long Term Goals - 02/08/20 1734      OT LONG TERM GOAL #1   Title Patient will complete an updated HEP to address strength and range of motion in BUE'S due 02/11/20    Time 6    Period Weeks    Status On-going      OT  LONG TERM GOAL #2   Title Patient will report decreased symptoms with ADL (e.g. combing or washing hair)    Time 6    Period Weeks    Status Achieved      OT LONG TERM GOAL #3   Title Patient will demonstrate 5 lb increase in grip strength in BUE to improve functional grip    Time 6    Period Weeks    Status On-going                 Plan - 02/08/20 1732    Clinical Impression Statement Patient reports overall improved pain and functional use of BUE's but still has persistent numbness, specifically in R Long finger.  Patient has recently been reporting persistent pain in neck - now has PT orders to address.    OT Occupational Profile and History Problem Focused Assessment - Including review of records relating to presenting problem    Occupational performance deficits (Please refer to evaluation for details): ADL's;IADL's;Rest and Sleep    Body Structure / Function / Physical Skills ADL;Coordination;Endurance;GMC;UE functional use;Sensation;Fascial restriction;Decreased knowledge of precautions;Body mechanics;Flexibility;IADL;Pain;Strength;FMC;Dexterity;Edema;Mobility;ROM    Rehab Potential Good    Clinical Decision Making Limited treatment options, no task modification necessary    Comorbidities Affecting Occupational Performance: None    Modification or Assistance to Complete Evaluation  No modification of tasks or assist necessary to complete eval    OT Frequency 2x / week    OT Duration 6 weeks    OT Treatment/Interventions Self-care/ADL training;Therapeutic exercise;Patient/family education;Splinting;Neuromuscular education;Fluidtherapy;Therapeutic activities;Passive range of motion;Manual Therapy;DME and/or AE instruction;Contrast Bath;Ultrasound;Cryotherapy;Moist Heat    Plan Symptom management, gentle strengthening, work toward remaining goals    OT Home Exercise Plan hand exercises- consevative management carpal tunel    Consulted and Agree with Plan of Care Patient            Patient will benefit from skilled therapeutic intervention in order to improve the following deficits and impairments:   Body Structure / Function / Physical Skills: ADL, Coordination, Endurance, GMC, UE functional use, Sensation, Fascial restriction, Decreased knowledge of precautions,  Body mechanics, Flexibility, IADL, Pain, Strength, FMC, Dexterity, Edema, Mobility, ROM       Visit Diagnosis: Pain in right hand  Pain in left hand  Localized edema  Stiffness of left wrist, not elsewhere classified  Stiffness of right wrist, not elsewhere classified  Other disturbances of skin sensation    Problem List Patient Active Problem List   Diagnosis Date Noted   Carpal tunnel syndrome of right wrist 11/10/2019   Symptomatic cholelithiasis 06/13/2018   Postpartum care and examination 04/29/2018   Urine frequency 04/29/2018   Normal labor 03/18/2018   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 [place], [place], [address] Phone: [phone]   Fax:  [phone]  Name: Sequita Wise MRN: [id number] Date of Birth: [birth year]

## 2020-02-15 ENCOUNTER — Ambulatory Visit: Payer: Self-pay | Admitting: Occupational Therapy

## 2020-02-16 ENCOUNTER — Telehealth: Payer: Self-pay | Admitting: *Deleted

## 2020-02-16 NOTE — Telephone Encounter (Signed)
Called centralized scheduling to schedule the  Appt. Patient apt scheduled for [place] on 10/12 at 0700 at [facility]. Patient aware.

## 2020-02-16 NOTE — Telephone Encounter (Signed)
Copied from [place] [id number]. Topic: General - Other >> Feb 16, 2020  8:34 AM Alanda Slim E wrote: Reason for CRM: Pt was advised to let the provider know when her cycle started so she can be scheduled for an ultrasound to check uterus / Pt call and stated she started her cycle today and it should last about 5 fives /please advise

## 2020-02-17 ENCOUNTER — Ambulatory Visit: Payer: Self-pay

## 2020-02-17 ENCOUNTER — Ambulatory Visit: Payer: Self-pay | Admitting: Occupational Therapy

## 2020-02-20 ENCOUNTER — Ambulatory Visit: Payer: Self-pay | Admitting: Occupational Therapy

## 2020-02-21 ENCOUNTER — Other Ambulatory Visit: Payer: Self-pay

## 2020-02-21 ENCOUNTER — Ambulatory Visit: Payer: Self-pay | Attending: Family Medicine

## 2020-02-21 ENCOUNTER — Ambulatory Visit: Payer: Self-pay | Attending: Physician Assistant

## 2020-02-21 ENCOUNTER — Ambulatory Visit (HOSPITAL_COMMUNITY)
Admission: RE | Admit: 2020-02-21 | Discharge: 2020-02-21 | Disposition: A | Discharge status: Home / Self Care | Payer: Self-pay | Source: Ambulatory Visit | Attending: Family Medicine | Admitting: Family Medicine

## 2020-02-21 DIAGNOSIS — M5412 Radiculopathy, cervical region: Secondary | ICD-10-CM | POA: Insufficient documentation

## 2020-02-21 DIAGNOSIS — R9389 Abnormal findings on diagnostic imaging of other specified body structures: Secondary | ICD-10-CM | POA: Insufficient documentation

## 2020-02-21 DIAGNOSIS — R293 Abnormal posture: Secondary | ICD-10-CM | POA: Insufficient documentation

## 2020-02-21 DIAGNOSIS — M62838 Other muscle spasm: Secondary | ICD-10-CM | POA: Insufficient documentation

## 2020-02-22 ENCOUNTER — Ambulatory Visit: Payer: Self-pay | Admitting: Occupational Therapy

## 2020-02-22 NOTE — Therapy (Signed)
[place], [place], [address] Phone: [phone]   Fax:  [phone]  Physical Therapy Evaluation  Patient Details  Name: Kari Ortega MRN: [id number] Date of Birth: 1981/03/28 Referring Provider (PT): Aundra Dubin, [place]   Encounter Date: 02/21/2020   PT End of Session - 02/22/20 1557    Visit Number 1    Number of Visits 13    Date for PT Re-Evaluation 04/14/20    Authorization Type Self pay    PT Start Time 1620    PT Stop Time 1706    PT Time Calculation (min) 46 min    Activity Tolerance Patient tolerated treatment well    Behavior During Therapy [facility] for tasks assessed/performed           Past Medical History:  Diagnosis Date  . Anemia   . 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  Unable to determine reason per pt.    . Irregular periods     Past Surgical History:  Procedure Laterality Date  . CHOLECYSTECTOMY N/A 06/14/2018   Procedure: LAPAROSCOPIC CHOLECYSTECTOMY;  Surgeon: [name], [name], MD;  Location: [place];  Service: General;  Laterality: N/A;  . TOOTH EXTRACTION      There were no vitals filed for this visit.    Subjective Assessment - 02/22/20 1541    Subjective Pt reports cervical, upper shoulder, and mid back pain R>L. When the pain is really bad it feel slike there is ball in my mid back    Limitations Lifting;House hold activities    Patient Stated Goals Decreased pain and for numbness of my hand to feel better    Currently in Pain? Yes    Pain Score 7    range of 3-8/10   Pain Location Neck   upper shoulder and midback   Pain Orientation Right;Left    Pain Descriptors / Indicators Aching;Tingling;Tightness;Throbbing    Pain Type Acute pain    Pain Radiating Towards lateral upper arm    Pain Onset More than a month ago    Pain Frequency Constant    Aggravating Factors  lifting, mopping, cleaning    Pain Relieving Factors  rest, massage    Effect of Pain on Daily Activities Moderate impact              [facility] PT Assessment - 02/22/20 0001      Assessment   Medical Diagnosis Cervical spine radiculopathy    Referring Provider (PT) Aundra Dubin, PA-C    Onset Date/Surgical Date --   1.5 months   Prior Therapy No      Precautions   Precautions None      Restrictions   Weight Bearing Restrictions No      Balance Screen   Has the patient fallen in the past 6 months No      Prior Function   Level of Independence Independent with basic ADLs    Vocation Works at home   Mother - 3 children     Cognition   Overall Cognitive Status Within Functional Limits for tasks assessed      Observation/Other Assessments   Focus on Therapeutic Outcomes (FOTO)  NA      Sensation   Light Touch --   constant numbness of R 3rd finger; intermit. 2nd and 4th     Posture/Postural Control   Posture/Postural Control Postural limitations    Postural Limitations Forward head;Rounded Shoulders  Posture Comments CT step off      ROM / Strength   AROM / PROM / Strength AROM;Strength      AROM   Overall AROM Comments For cervical and R shoulder ROMs WNLs. Pt reports pulling R shoulder discomfort c flexion, SB, and rotation with L SB and  rotation causing the most discomfort.      Strength   Overall Strength Comments R shoulder strength was 4+ to 5/5    Strength Assessment Site Hand    Right/Left hand Right;Left    Right Hand Grip (lbs) 30, 30 = 30    Left Hand Grip (lbs) 40, 28 = 34                      Objective measurements completed on examination: See above findings.               PT Education - 02/22/20 1556    Education Details Eval findings, POC, HEP, use of tennis ball for trigger point massage    Person(s) Educated Patient    Methods Explanation;Demonstration;Tactile cues;Verbal cues;Handout    Comprehension Verbalized understanding;Returned demonstration;Verbal cues  required;Tactile cues required;Need further instruction            PT Short Term Goals - 02/22/20 1630      PT SHORT TERM GOAL #1   Title Pt will be independent in her HEP.    Baseline Started on HEP    Status New    Target Date 03/14/20      PT SHORT TERM GOAL #2   Title Pt will voice understanding of measure to reduce and manage pain    Status New    Target Date 03/14/20      PT SHORT TERM GOAL #3   Title Pt will voice understanding of proper posture body mechanics and their impact on her pain    Status New    Target Date 03/14/20             PT Long Term Goals - 02/22/20 1635      PT LONG TERM GOAL #1   Title Pt will be able to self correct posture without cueing  in sitting and standing    Status New    Target Date 04/14/20      PT LONG TERM GOAL #2   Title Pt will report improved pain level with her daily activities of cleaning and cooking with 4/10 pain or less.    Baseline 3-8/10    Status New    Target Date 04/14/20      PT LONG TERM GOAL #3   Title Pt will demonstrate proper body mechanics for daily activities    Status New    Target Date 04/14/20      PT LONG TERM GOAL #4   Title Pt wil be ind in an advanced HE{P    Status New    Target Date 04/14/20                  Plan - 02/22/20 1608    Clinical Impression Statement Pt presents with neck, upper shoulder, and mid back pain appearing primarily related to increased muscle tension associated with poor posture. Pt was not able to complete a chin tuck to correct forward head posture. Cervical ROMs were WNLs and they reproduced R upper shoulder pulling discomfort, but not arm or hand pain or increased numbess. Pt will benefit from PT to address posture, increased cervical, upper  trap, and mid back muscle tension, and pain to improve her tolerance and functional abilities of her neck and upper extremities.    Personal Factors and Comorbidities Fitness;Comorbidity 2    Comorbidities obese, R  carpal tunnel    Examination-Activity Limitations Lift;Sit;Reach Overhead    Examination-Participation Restrictions Laundry;Cleaning;Meal Prep    Stability/Clinical Decision Making Stable/Uncomplicated    Clinical Decision Making Low    Rehab Potential Good    PT Frequency 2x / week    PT Duration 6 weeks    PT Treatment/Interventions Biofeedback;Cryotherapy;Electrical Stimulation;Therapeutic activities;Therapeutic exercise;Neuromuscular re-education;Manual techniques;Patient/family education;Dry needling;Spinal Manipulations;Joint Manipulations;ADLs/Self Care Home Management;Iontophoresis 4mg /ml Dexamethasone;Traction;Taping    PT Next Visit Plan Assess response to HEP and Tennis ball massage. Progress ther ex and use modalities as indicated    PT Home Exercise Plan [id number]    Consulted and Agree with Plan of Care Patient           Patient will benefit from skilled therapeutic intervention in order to improve the following deficits and impairments:  Decreased activity tolerance, Increased muscle spasms, Obesity, Pain, Decreased strength, Postural dysfunction, Improper body mechanics  Visit Diagnosis: Radiculopathy of cervical spine  Muscle spasms of neck  Abnormal posture     Problem List Patient Active Problem List   Diagnosis Date Noted  . Carpal tunnel syndrome of right wrist 11/10/2019  . Symptomatic cholelithiasis 06/13/2018  . Postpartum care and examination 04/29/2018  . Urine frequency 04/29/2018  . Normal labor 03/18/2018  . 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 BMI 30-39.9 09/21/2017  . Obesity in pregnancy 09/21/2017  . Language barrier 09/21/2017  . Supervision of high risk pregnancy, antepartum 08/24/2017  . AMA (advanced maternal age) multigravida 35+ 08/24/2017  . HYPERTRIGLYCERIDEMIA 12/15/2006    Gar Ponto MS, PT 02/22/20 4:48 PM  [facility] [address] [place], [place], [address] Phone:  [phone]   Fax:  [phone]  Name: Melodi Happel MRN: [id number] Date of Birth: November 16, 1980

## 2020-02-27 ENCOUNTER — Encounter: Payer: Self-pay | Admitting: Family Medicine

## 2020-02-27 ENCOUNTER — Other Ambulatory Visit: Payer: Self-pay

## 2020-02-27 ENCOUNTER — Ambulatory Visit: Payer: Self-pay | Attending: Family Medicine | Admitting: Family Medicine

## 2020-02-27 VITALS — BP 100/70 | HR 70 | Temp 97.3°F | Ht 59.0 in | Wt 176.8 lb

## 2020-02-27 DIAGNOSIS — Z603 Acculturation difficulty: Secondary | ICD-10-CM

## 2020-02-27 DIAGNOSIS — G8929 Other chronic pain: Secondary | ICD-10-CM

## 2020-02-27 DIAGNOSIS — R1032 Left lower quadrant pain: Secondary | ICD-10-CM

## 2020-02-27 DIAGNOSIS — Z789 Other specified health status: Secondary | ICD-10-CM

## 2020-02-27 DIAGNOSIS — Z758 Other problems related to medical facilities and other health care: Secondary | ICD-10-CM

## 2020-02-27 DIAGNOSIS — Z23 Encounter for immunization: Secondary | ICD-10-CM

## 2020-02-27 NOTE — Progress Notes (Signed)
Established Patient Office Visit  Subjective:  Patient ID: Kari Ortega, female    DOB: Jan 26, 1981  Age: 39 y.o. MRN: [id number]  Video interpretation system used at today's visit to help with language barrier  CC:  Chief Complaint  Patient presents with  . Results    HPI Kari Ortega, 39 year old female, who presents secondary to complaint of recurrent left lower quadrant pain for which she has had recent pelvic ultrasound.  On review of chart, patient had annual physical exam on 12/15/2019 and at that time had complaint of left lower quadrant pain for which patient was concerned about possible ovarian cyst.  Patient was scheduled for pelvic exam and on initial ultrasound, endometrial thickening was noted and patient was asked to have repeat ultrasound during the first week of her next menses.  This ultrasound had to be rescheduled to coordinate with onset of patient's menses.  Ultrasound which was done 02/21/2020 showed that endometrial thickening had resolved and normal appearance of the ovaries.         Patient reports continued left lower quadrant pain especially when she coughs or sneezes as she also feels that something pops out with coughing or sneezing but then goes back into place.  She reports that she does have some mild pain present at today's visit in the left lower quadrant.  Pain is sharp when it occurs.  Pain has been going on for almost a year.  She denies any nausea/vomiting/diarrhea or constipation.  No blood in the stool or black stools.  Past Medical History:  Diagnosis Date  . Anemia   . 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  Unable to determine reason per pt.    . Irregular periods     Past Surgical History:  Procedure Laterality Date  . CHOLECYSTECTOMY N/A 06/14/2018   Procedure: LAPAROSCOPIC CHOLECYSTECTOMY;  Surgeon: [name], [name], MD;  Location: [place];  Service: General;  Laterality: N/A;  . TOOTH  EXTRACTION      Family History  Problem Relation Age of Onset  . Diabetes Mother     Social History   Socioeconomic History  . Marital status: Married    Spouse name: Not on file  . Number of children: Not on file  . Years of education: Not on file  . Highest education level: Not on file  Occupational History  . Not on file  Tobacco Use  . Smoking status: Never Smoker  . Smokeless tobacco: Never Used  Vaping Use  . Vaping Use: Never used  Substance and Sexual Activity  . Alcohol use: No  . Drug use: No  . Sexual activity: Yes    Birth control/protection: None  Other Topics Concern  . Not on file  Social History Narrative  . Not on file   Social Determinants of Health   Financial Resource Strain:   . Difficulty of Paying Living Expenses: Not on file  Food Insecurity:   . Worried About Charity fundraiser in the Last Year: Not on file  . Ran Out of Food in the Last Year: Not on file  Transportation Needs:   . Lack of Transportation (Medical): Not on file  . Lack of Transportation (Non-Medical): Not on file  Physical Activity:   . Days of Exercise per Week: Not on file  . Minutes of Exercise per Session: Not on file  Stress:   . Feeling of Stress : Not on file  Social Connections:   .  Frequency of Communication with Friends and Family: Not on file  . Frequency of Social Gatherings with Friends and Family: Not on file  . Attends Religious Services: Not on file  . Active Member of Clubs or Organizations: Not on file  . Attends Archivist Meetings: Not on file  . Marital Status: Not on file  Intimate Partner Violence:   . Fear of Current or Ex-Partner: Not on file  . Emotionally Abused: Not on file  . Physically Abused: Not on file  . Sexually Abused: Not on file    Outpatient Medications Prior to Visit  Medication Sig Dispense Refill  . acetaminophen (TYLENOL) 500 MG tablet Take 2 tablets (1,000 mg total) by mouth every 6 (six) hours as needed. 30  tablet 0  . Cholecalciferol (VITAMIN D-3) 125 MCG (5000 UT) TABS Take 1 tablet by mouth daily. 90 tablet 3  . ibuprofen (ADVIL) 600 MG tablet Take 1 tablet (600 mg total) by mouth every 8 (eight) hours as needed. 30 tablet 0  . Sunscreen SPF50 LOTN Apply 1 application topically in the morning, at noon, and at bedtime. 100 mL 0  . diazepam (VALIUM) 5 MG tablet Take 1 by mouth 1 hour  pre-procedure with very light food. May bring 2nd tablet to appointment. (Patient not taking: Reported on 12/15/2019) 2 tablet 0  . meloxicam (MOBIC) 15 MG tablet Take 0.5-1 tablets (7.5-15 mg total) by mouth daily as needed for pain. (Patient not taking: Reported on 02/27/2020) 30 tablet 6  . Prenatal Vit-Fe Fumarate-FA (MULTIVITAMIN-PRENATAL) 27-0.8 MG TABS tablet Take 1 tablet by mouth daily at 12 noon. (Patient not taking: Reported on 12/15/2019)     Facility-Administered Medications Prior to Visit  Medication Dose Route Frequency Provider Last Rate Last Admin  . polyethylene glycol powder (GLYCOLAX/MIRALAX) container 255 g  1 Container Oral Once Rasch, Anderson Malta I, NP        No Known Allergies  ROS Review of Systems    Objective:    Physical Exam Vitals and nursing note reviewed.  Constitutional:      General: She is not in acute distress.    Appearance: Normal appearance. She is obese.  Cardiovascular:     Rate and Rhythm: Normal rate and regular rhythm.  Pulmonary:     Effort: Pulmonary effort is normal.     Breath sounds: Normal breath sounds.  Abdominal:     Palpations: Abdomen is soft.     Tenderness: There is abdominal tenderness (Tenderness to palpation in the left lower abdomen, no palpable abnormality such as hernia.  Mild subcutaneous nodules likely representing lipomas). There is no right CVA tenderness, left CVA tenderness or guarding.  Neurological:     Mental Status: She is alert.     BP 100/70 (BP Location: Right Arm, Patient Position: Sitting)   Pulse 70   Temp (!) 97.3 F (36.3  C)   Ht 4\' 11"  (1.499 m)   Wt 176 lb 12.8 oz (80.2 kg)   SpO2 97%   BMI 35.71 kg/m  Wt Readings from Last 3 Encounters:  02/27/20 176 lb 12.8 oz (80.2 kg)  12/15/19 174 lb 6.4 oz (79.1 kg)  11/23/19 175 lb 9.6 oz (79.7 kg)     Health Maintenance Due  Topic Date Due  . Hepatitis C Screening  Never done  . COVID-19 Vaccine (1) Never done  . INFLUENZA VACCINE  12/11/2019      Lab Results  Component Value Date   TSH 1.420 08/24/2017  Lab Results  Component Value Date   WBC 11.4 (H) 12/15/2019   HGB 12.4 12/15/2019   HCT 39.0 12/15/2019   MCV 86 12/15/2019   PLT 302 12/15/2019   Lab Results  Component Value Date   NA 142 12/15/2019   K 4.2 12/15/2019   CO2 22 12/15/2019   GLUCOSE 85 12/15/2019   BUN 12 12/15/2019   CREATININE 0.59 12/15/2019   BILITOT 0.9 06/14/2018   ALKPHOS 162 (H) 06/14/2018   AST 209 (H) 06/14/2018   ALT 253 (H) 06/14/2018   PROT 6.6 06/14/2018   ALBUMIN 3.1 (L) 06/14/2018   CALCIUM 8.6 (L) 12/15/2019   ANIONGAP 8 06/14/2018   Lab Results  Component Value Date   CHOL 198 12/15/2019   Lab Results  Component Value Date   HDL 37 (L) 12/15/2019   Lab Results  Component Value Date   LDLCALC 132 (H) 12/15/2019   Lab Results  Component Value Date   TRIG 159 (H) 12/15/2019   Lab Results  Component Value Date   CHOLHDL 5.4 (H) 12/15/2019   Lab Results  Component Value Date   HGBA1C 5.7 (H) 12/15/2019      Assessment & Plan:  1. Chronic left lower quadrant pain Patient reports recurrent left lower quadrant pain which she states is worse with coughing/sneezing and she feels as if something "pops out" when she has coughing or sneezing and increased pain.  On examination, she does have some palpable subcutaneous nodules which are most likely lipomas, but no actual palpable abnormality such as hernia appreciated on examination in the areas where patient reports that her pain occurs.  She will be referred to general surgery for further  evaluation.  She is also status post pelvic ultrasound as she had complaint of left lower quadrant pain during her annual physical exam and ultrasound was ordered by another physician who performed the exam.  Initial ultrasound showed endometrial thickening and patient had repeat ultrasound which showed resolution of endometrial thickening and there was no other source of patient's left lower quadrant pain such as ovarian cyst or uterine fibroid. - Ambulatory referral to General Surgery  2. Need for immunization against influenza Patient was offered and agreed to have influenza immunization at today's visit and she also received educational material regarding influenza immunization. - Flu Vaccine QUAD 36+ mos IM  3.  Language barrier Video interpretation system used to help with language barrier   Follow-up: Return for abdominal pain- surgery referral placed; office f/u here as needed.   Antony Blackbird, MD

## 2020-02-27 NOTE — Progress Notes (Signed)
U/S ON 10/12 WANTS FLU

## 2020-02-29 ENCOUNTER — Other Ambulatory Visit: Payer: Self-pay

## 2020-02-29 ENCOUNTER — Ambulatory Visit: Payer: Self-pay

## 2020-02-29 DIAGNOSIS — R293 Abnormal posture: Secondary | ICD-10-CM

## 2020-02-29 DIAGNOSIS — M62838 Other muscle spasm: Secondary | ICD-10-CM

## 2020-02-29 DIAGNOSIS — M5412 Radiculopathy, cervical region: Secondary | ICD-10-CM

## 2020-02-29 NOTE — Therapy (Signed)
[place], [place], [address] Phone: [phone]   Fax:  [phone]  Physical Therapy Treatment  Patient Details  Name: Kari Ortega MRN: [id number] Date of Birth: 09-02-1980 Referring Provider (PT): Aundra Dubin, [place]   Encounter Date: 02/29/2020   PT End of Session - 02/29/20 1632    Visit Number 2    Number of Visits 13    Date for PT Re-Evaluation 04/14/20    Authorization Type Self pay    PT Start Time 1625    PT Stop Time 1700    PT Time Calculation (min) 35 min    Activity Tolerance Patient tolerated treatment well    Behavior During Therapy [facility] for tasks assessed/performed           Past Medical History:  Diagnosis Date  . Anemia   . 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  Unable to determine reason per pt.    . Irregular periods     Past Surgical History:  Procedure Laterality Date  . CHOLECYSTECTOMY N/A 06/14/2018   Procedure: LAPAROSCOPIC CHOLECYSTECTOMY;  Surgeon: [name], [name], MD;  Location: [place];  Service: General;  Laterality: N/A;  . TOOTH EXTRACTION      There were no vitals filed for this visit.   Subjective Assessment - 02/29/20 1625    Subjective "I am having cramping from here to down here" as pt points from bilateral shoulders to mid thoracic spine.    Limitations Lifting;House hold activities    Patient Stated Goals Decreased pain and for numbness of my hand to feel better    Currently in Pain? Yes    Pain Score 6     Pain Location Neck    Pain Orientation Right;Left    Pain Descriptors / Indicators Aching;Tingling;Tightness;Throbbing    Pain Type Acute pain    Pain Radiating Towards middle finger continues to have numbness    Pain Onset More than a month ago    Pain Onset More than a month ago              [facility] PT Assessment - 02/29/20 0001      Assessment   Medical Diagnosis Cervical spine  radiculopathy    Referring Provider (PT) Nathaniel Man                         [facility] Adult PT Treatment/Exercise - 02/29/20 0001      Self-Care   Self-Care Other Self-Care Comments    Other Self-Care Comments  Pt education regarding trigger points and myofascial release, HEP, upper crossed syndrome and importance of stretching tight muscles and strengthening weak muscles. Discussed dry needling for next session.      Exercises   Exercises Neck      Neck Exercises: Machines for Strengthening   UBE (Upper Arm Bike) L2 20min forward, 2 min backward      Neck Exercises: Standing   Other Standing Exercises Standing Ys at wall for lower trap activation 5x before pt began having increased irritation along trigger points in back musculature      Neck Exercises: Seated   Other Seated Exercise Seated scapular retraction 15x with cues to avoid shrugging shoulder/upper trap activation      Neck Exercises: Supine   Neck Retraction 15 reps;3 secs    Neck Retraction Limitations Cues for proper technique      Manual Therapy  Manual Therapy Soft tissue mobilization;Joint mobilization;Myofascial release;Passive ROM;Manual Traction    Manual therapy comments --    Joint Mobilization Grades I-IV PA lower cervical and thoracic joint mobilizations. Numbness and tingling in C7 distribution (R middle finger) that was eased following PA mobs at C6-T1.    Soft tissue mobilization STM along medial border and spine of scapula; STM along B rhomboids, upper traps, paraspinals, levator scap, and L latissimus dorsi    Myofascial Release Myofascial release of trigger points in B upper trap, paraspinals, levator scap, and rhomboids    Passive ROM Passive B upper trap and levator scap stretch    Manual Traction Suboccipital release with gentle intermittent cervical distraction      Neck Exercises: Stretches   Upper Trapezius Stretch 2 reps;30 seconds;Right;Left    Corner Stretch 4 reps;30  seconds   Doorway 2 x each side                 PT Education - 02/29/20 1820    Education Details Pt education regarding trigger points and myofascial release, HEP, upper crossed syndrome and importance of stretching tight muscles and strengthening weak muscles. Discussed dry needling for next session.    Person(s) Educated Patient    Methods Explanation;Demonstration;Tactile cues;Verbal cues    Comprehension Verbalized understanding;Returned demonstration            PT Short Term Goals - 02/22/20 1630      PT SHORT TERM GOAL #1   Title Pt will be independent in her HEP.    Baseline Started on HEP    Status New    Target Date 03/14/20      PT SHORT TERM GOAL #2   Title Pt will voice understanding of measure to reduce and manage pain    Status New    Target Date 03/14/20      PT SHORT TERM GOAL #3   Title Pt will voice understanding of proper posture body mechanics and their impact on her pain    Status New    Target Date 03/14/20             PT Long Term Goals - 02/22/20 1635      PT LONG TERM GOAL #1   Title Pt will be able to self correct posture without cueing  in sitting and standing    Status New    Target Date 04/14/20      PT LONG TERM GOAL #2   Title Pt will report improved pain level with her daily activities of cleaning and cooking with 4/10 pain or less.    Baseline 3-8/10    Status New    Target Date 04/14/20      PT LONG TERM GOAL #3   Title Pt will demonstrate proper body mechanics for daily activities    Status New    Target Date 04/14/20      PT LONG TERM GOAL #4   Title Pt wil be ind in an advanced HE{P    Status New    Target Date 04/14/20                 Plan - 02/29/20 1811    Clinical Impression Statement Pt tolerated treatment session well with decrease in R middle finger (C7 distribution) numbness and tingling. Pt continued to have tenderness and symptoms in trigger points throughout bilateral shoulder and upper/mid  back (B upper traps, paraspinals, rhomboids, L latissimus dorsi). Pt expressed interest in dry needling for next  session but did not want to today due to existing soreness in her arm from a recent flu shot. Pt will continue to benefit from postural control exercises and stretches.    Personal Factors and Comorbidities Fitness;Comorbidity 2    Comorbidities obese, R carpal tunnel    Examination-Activity Limitations Lift;Sit;Reach Overhead    Examination-Participation Restrictions Laundry;Cleaning;Meal Prep    Stability/Clinical Decision Making Stable/Uncomplicated    Rehab Potential Good    PT Frequency 2x / week    PT Duration 6 weeks    PT Treatment/Interventions Biofeedback;Cryotherapy;Electrical Stimulation;Therapeutic activities;Therapeutic exercise;Neuromuscular re-education;Manual techniques;Patient/family education;Dry needling;Spinal Manipulations;Joint Manipulations;ADLs/Self Care Home Management;Iontophoresis 4mg /ml Dexamethasone;Traction;Taping    PT Next Visit Plan Assess response to previous session. Dry needling if pt shows continued interest. Progress ther ex and use modalities as indicated.    PT Home Exercise Plan [id number] - upper trap stretch    Consulted and Agree with Plan of Care Patient           Patient will benefit from skilled therapeutic intervention in order to improve the following deficits and impairments:  Decreased activity tolerance, Increased muscle spasms, Obesity, Pain, Decreased strength, Postural dysfunction, Improper body mechanics  Visit Diagnosis: Radiculopathy of cervical spine  Muscle spasms of neck  Abnormal posture     Problem List Patient Active Problem List   Diagnosis Date Noted  . Carpal tunnel syndrome of right wrist 11/10/2019  . Symptomatic cholelithiasis 06/13/2018  . Postpartum care and examination 04/29/2018  . Urine frequency 04/29/2018  . Normal labor 03/18/2018  . 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 BMI 30-39.9 09/21/2017   . Obesity in pregnancy 09/21/2017  . Language barrier 09/21/2017  . Supervision of high risk pregnancy, antepartum 08/24/2017  . AMA (advanced maternal age) multigravida 35+ 08/24/2017  . HYPERTRIGLYCERIDEMIA 12/15/2006   Haydee Monica, PT, DPT 02/29/20 6:35 PM  [place] [facility] [address] [place], [place], [address] Phone: [phone]   Fax:  [phone]  Name: Kari Ortega MRN: [id number] Date of Birth: [birth year]

## 2020-03-06 ENCOUNTER — Ambulatory Visit: Payer: Self-pay

## 2020-03-06 ENCOUNTER — Other Ambulatory Visit: Payer: Self-pay

## 2020-03-06 DIAGNOSIS — R293 Abnormal posture: Secondary | ICD-10-CM

## 2020-03-06 DIAGNOSIS — M62838 Other muscle spasm: Secondary | ICD-10-CM

## 2020-03-06 DIAGNOSIS — M5412 Radiculopathy, cervical region: Secondary | ICD-10-CM

## 2020-03-06 NOTE — Therapy (Signed)
[place], [place], [address] Phone: [phone]   Fax:  [phone]  Physical Therapy Treatment  Patient Details  Name: Kari Ortega MRN: [id number] Date of Birth: 1980/08/23 Referring Provider (PT): Aundra Dubin, [place]   Encounter Date: 03/06/2020   PT End of Session - 03/06/20 1536    Visit Number 3    Number of Visits 13    Date for PT Re-Evaluation 04/14/20    Authorization Type Self pay    PT Start Time 1537   pt arrived late   PT Stop Time 1615    PT Time Calculation (min) 38 min    Activity Tolerance Patient tolerated treatment well    Behavior During Therapy [facility] for tasks assessed/performed           Past Medical History:  Diagnosis Date  . Anemia   . 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  Unable to determine reason per pt.    . Irregular periods     Past Surgical History:  Procedure Laterality Date  . CHOLECYSTECTOMY N/A 06/14/2018   Procedure: LAPAROSCOPIC CHOLECYSTECTOMY;  Surgeon: [name], [name], MD;  Location: [place];  Service: General;  Laterality: N/A;  . TOOTH EXTRACTION      There were no vitals filed for this visit.   Subjective Assessment - 03/06/20 1541    Subjective "I am feeling better than last time but having pain in my neck and back still. My middle finger is still numb but it comes and goes."    Limitations Lifting;House hold activities    Patient Stated Goals Decreased pain and for numbness of my hand to feel better    Currently in Pain? Yes    Pain Score 7     Pain Location Neck    Pain Orientation Right;Left    Pain Descriptors / Indicators Aching;Tightness;Tingling;Throbbing    Pain Type Acute pain    Pain Radiating Towards middle finger continues to have numbness    Pain Onset More than a month ago    Pain Onset More than a month ago              [facility] PT Assessment - 03/06/20 0001      Assessment     Medical Diagnosis Cervical spine radiculopathy    Referring Provider (PT) Nathaniel Man                         [facility] Adult PT Treatment/Exercise - 03/06/20 0001      Self-Care   Self-Care Other Self-Care Comments    Other Self-Care Comments  HEP, form during interventions, and importance of exercises/stretches for maintenance of pain relief and improved activity tolerance in addition to STM. Education regarding TPDN and expectation of some soreness afterwards.      Neck Exercises: Machines for Strengthening   UBE (Upper Arm Bike) L2.5 3 min forward, 3 min backward      Neck Exercises: Theraband   Rows 20 reps;Red    Rows Limitations Cues provided for optimal form      Neck Exercises: Standing   Neck Retraction 15 reps    Neck Retraction Limitations Against wall with rolled wash cloth behind head; cues provided for form    Other Standing Exercises Cervical EXT SNAG with pillow case 15x      Manual Therapy   Manual Therapy Soft tissue mobilization;Joint mobilization;Myofascial release;Passive ROM;Manual  Traction    Manual therapy comments Skilled palpation and monitoring during TPDN of B upper traps done by Carolyne Littles    Joint Mobilization Grades I-IV PA lower cervical and thoracic joint mobilizations. Numbness and tingling in C7 distribution (R middle finger) that was eased but still slightly present following PA mobs at C6-T1.    Soft tissue mobilization STM along medial border and spine of scapula; STM along B rhomboids, upper traps, paraspinals, levator scap, and L latissimus dorsi    Myofascial Release Myofascial release of trigger points in B upper trap, paraspinals, levator scap, and rhomboids    Manual Traction Suboccipital release with gentle intermittent cervical distraction            Trigger Point Dry Needling - 03/06/20 0001    Consent Given? Yes    Education Handout Provided Previously provided   Verbal education provided   Muscles  Treated Head and Neck Upper trapezius   bilateral   Upper Trapezius Response Twitch reponse elicited                PT Education - 03/06/20 1623    Education Details HEP, form during interventions, and importance of exercises/stretches for maintenance of pain relief and improved activity tolerance in addition to STM. Education regarding TPDN and expectation of some soreness afterwards.    Person(s) Educated Patient    Methods Explanation;Demonstration;Tactile cues;Verbal cues    Comprehension Verbalized understanding;Returned demonstration;Tactile cues required;Verbal cues required            PT Short Term Goals - 02/22/20 1630      PT SHORT TERM GOAL #1   Title Pt will be independent in her HEP.    Baseline Started on HEP    Status New    Target Date 03/14/20      PT SHORT TERM GOAL #2   Title Pt will voice understanding of measure to reduce and manage pain    Status New    Target Date 03/14/20      PT SHORT TERM GOAL #3   Title Pt will voice understanding of proper posture body mechanics and their impact on her pain    Status New    Target Date 03/14/20             PT Long Term Goals - 02/22/20 1635      PT LONG TERM GOAL #1   Title Pt will be able to self correct posture without cueing  in sitting and standing    Status New    Target Date 04/14/20      PT LONG TERM GOAL #2   Title Pt will report improved pain level with her daily activities of cleaning and cooking with 4/10 pain or less.    Baseline 3-8/10    Status New    Target Date 04/14/20      PT LONG TERM GOAL #3   Title Pt will demonstrate proper body mechanics for daily activities    Status New    Target Date 04/14/20      PT LONG TERM GOAL #4   Title Pt wil be ind in an advanced HE{P    Status New    Target Date 04/14/20                 Plan - 03/06/20 1626    Clinical Impression Statement Pt experienced ease of numbness and tingling in R middle finger following PA joint  mobilizations (grades I-IV) of lower cervical and  thoracic spine. Pt has TTP during joint mobilizations and STM along B upper traps, paraspinals, and rhomboids. Pt had twitch response during TPDN of B upper traps with no adverse effects noted.    Personal Factors and Comorbidities Fitness;Comorbidity 2    Comorbidities obese, R carpal tunnel    Examination-Activity Limitations Lift;Sit;Reach Overhead    Examination-Participation Restrictions Laundry;Cleaning;Meal Prep    Stability/Clinical Decision Making Stable/Uncomplicated    Rehab Potential Good    PT Frequency 2x / week    PT Duration 6 weeks    PT Treatment/Interventions Biofeedback;Cryotherapy;Electrical Stimulation;Therapeutic activities;Therapeutic exercise;Neuromuscular re-education;Manual techniques;Patient/family education;Dry needling;Spinal Manipulations;Joint Manipulations;ADLs/Self Care Home Management;Iontophoresis 4mg /ml Dexamethasone;Traction;Taping    PT Next Visit Plan Assess response to TPDN previous session. Progress postural control interventions (rows, cervical retractions, lower trap activation, seated thoracic EXT against foam roller, serratus anterior, corner stretch, upper trap/levator stretches).    PT Home Exercise Plan [id number] - upper trap stretch    Consulted and Agree with Plan of Care Patient           Patient will benefit from skilled therapeutic intervention in order to improve the following deficits and impairments:  Decreased activity tolerance, Increased muscle spasms, Obesity, Pain, Decreased strength, Postural dysfunction, Improper body mechanics  Visit Diagnosis: Radiculopathy of cervical spine  Muscle spasms of neck  Abnormal posture     Problem List Patient Active Problem List   Diagnosis Date Noted  . Carpal tunnel syndrome of right wrist 11/10/2019  . Symptomatic cholelithiasis 06/13/2018  . Postpartum care and examination 04/29/2018  . Urine frequency 04/29/2018  . Normal labor  03/18/2018  . 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 BMI 30-39.9 09/21/2017  . Obesity in pregnancy 09/21/2017  . Language barrier 09/21/2017  . Supervision of high risk pregnancy, antepartum 08/24/2017  . AMA (advanced maternal age) multigravida 35+ 08/24/2017  . HYPERTRIGLYCERIDEMIA 12/15/2006     Haydee Monica, PT, DPT 03/06/20 4:36 PM   [facility] [address] [place], [place], [address] Phone: [phone]   Fax:  [phone]  Name: Kari Ortega MRN: [id number] Date of Birth: 02-01-1981

## 2020-03-08 ENCOUNTER — Other Ambulatory Visit: Payer: Self-pay

## 2020-03-08 ENCOUNTER — Ambulatory Visit: Payer: Self-pay

## 2020-03-08 DIAGNOSIS — R293 Abnormal posture: Secondary | ICD-10-CM

## 2020-03-08 DIAGNOSIS — M5412 Radiculopathy, cervical region: Secondary | ICD-10-CM

## 2020-03-08 DIAGNOSIS — M62838 Other muscle spasm: Secondary | ICD-10-CM

## 2020-03-08 NOTE — Therapy (Signed)
[place], [place], [address] Phone: [phone]   Fax:  [phone]  Physical Therapy Treatment  Patient Details  Name: Kari Ortega MRN: [id number] Date of Birth: [birth year] Referring Provider (PT): Aundra Dubin, [place]   Encounter Date: 03/08/2020   PT End of Session - 03/08/20 1627    Visit Number 4    Number of Visits 13    Date for PT Re-Evaluation 04/14/20    Authorization Type Self pay    PT Start Time 1625   pt arrived late   PT Stop Time 1705    PT Time Calculation (min) 40 min    Activity Tolerance Patient tolerated treatment well    Behavior During Therapy [facility] for tasks assessed/performed           Past Medical History:  Diagnosis Date  . Anemia   . 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  Unable to determine reason per pt.    . Irregular periods     Past Surgical History:  Procedure Laterality Date  . CHOLECYSTECTOMY N/A 06/14/2018   Procedure: LAPAROSCOPIC CHOLECYSTECTOMY;  Surgeon: [name], [name], MD;  Location: [place];  Service: General;  Laterality: N/A;  . TOOTH EXTRACTION      There were no vitals filed for this visit.   Subjective Assessment - 03/08/20 1629    Subjective "I am having pain in my neck and my back with numbness in my middle finger" referring to R middle finger. Pt reported feeling some relief accompanied with soreness in her shoulders (bilateral upper traps) secondary to trigger point dry needling done during previous session. Pt was reassured that soreness after TPDN is an expected response.    Limitations Lifting;House hold activities    Patient Stated Goals Decreased pain and for numbness of my hand to feel better    Currently in Pain? Yes    Pain Score 6     Pain Location Neck    Pain Orientation Right;Left    Pain Descriptors / Indicators Aching;Throbbing;Tingling;Tightness    Pain Onset More than a month  ago    Pain Onset More than a month ago              [facility] PT Assessment - 03/08/20 0001      Assessment   Medical Diagnosis Cervical spine radiculopathy    Referring Provider (PT) Nathaniel Man                         [facility] PT Treatment/Exercise - 03/08/20 0001      Self-Care   Self-Care Other Self-Care Comments    Other Self-Care Comments  Reviewed HEP and pt advised to continue performing interventions at home, education regarding expected response following TPDN and IASTM, explanation during manual therapy, cues for form during exercises (cat/camel, childs pose, shoulder rows/EXT, Ys at wall, doorway stretch).      Neck Exercises: Machines for Strengthening   UBE (Upper Arm Bike) L3.5 3 min forward, 3 min backward      Neck Exercises: Theraband   Shoulder Extension 20 reps;Blue    Shoulder Extension Limitations Cues for slow eccentric control    Rows 20 reps;Blue    Rows Limitations Cues provided for optimal form      Neck Exercises: Standing   Neck Retraction 20 reps    Neck Retraction Limitations Against ball at wall; cues for form  Other Standing Exercises Standing Ys at wall for lower trap activation 8x before pt had complaints of irritation along R thoracic paraspinals      Neck Exercises: Prone   Other Prone Exercise Cat/camel 10x with tactile, verbal, and visual cues for form    Other Prone Exercise Childs pose with cues for form 5x ~10 second holds      Manual Therapy   Manual Therapy Soft tissue mobilization;Joint mobilization;Myofascial release;Passive ROM;Manual Traction    Manual therapy comments IASTM along R levator scap and R thoracic paraspinals - explained to pt that minor redness is expected    Joint Mobilization PA at L C6 TP to facilitation R rotation and decrease R middle finger numbness    Soft tissue mobilization STM along B levator scap, upper traps, and R thoracic paraspinals    Myofascial Release Myofacial  release in B levator scap and upper traps    Manual Traction Suboccipital release with gentle intermittent cervical distraction      Neck Exercises: Stretches   Corner Stretch 2 reps;30 seconds   Doorway 2 x each side                 PT Education - 03/08/20 1630    Education Details Reviewed HEP and pt advised to continue performing interventions at home, education regarding expected response following TPDN and IASTM, explanation during manual therapy, cues for form during exercises (cat/camel, childs pose, shoulder rows/EXT, Ys at wall, doorway stretch).    Person(s) Educated Patient    Methods Explanation;Demonstration;Tactile cues;Verbal cues    Comprehension Verbalized understanding;Returned demonstration;Verbal cues required;Tactile cues required            PT Short Term Goals - 02/22/20 1630      PT SHORT TERM GOAL #1   Title Pt will be independent in her HEP.    Baseline Started on HEP    Status New    Target Date 03/14/20      PT SHORT TERM GOAL #2   Title Pt will voice understanding of measure to reduce and manage pain    Status New    Target Date 03/14/20      PT SHORT TERM GOAL #3   Title Pt will voice understanding of proper posture body mechanics and their impact on her pain    Status New    Target Date 03/14/20             PT Long Term Goals - 02/22/20 1635      PT LONG TERM GOAL #1   Title Pt will be able to self correct posture without cueing  in sitting and standing    Status New    Target Date 04/14/20      PT LONG TERM GOAL #2   Title Pt will report improved pain level with her daily activities of cleaning and cooking with 4/10 pain or less.    Baseline 3-8/10    Status New    Target Date 04/14/20      PT LONG TERM GOAL #3   Title Pt will demonstrate proper body mechanics for daily activities    Status New    Target Date 04/14/20      PT LONG TERM GOAL #4   Title Pt wil be ind in an advanced HE{P    Status New    Target Date  04/14/20                 Plan - 03/08/20 1715  Clinical Impression Statement Pt tolerated progression to blue theraband and addition of B shoulder extension, cat/camel, and childs pose without complaints of significant increase in pain. Pt did have irritation of R thoracic paraspinals during Ys at wall that was eased with IASTM and stretches (cat/camel and childs pose). Pt experienced relief of R middle finger numbness following cervical distraction and PA of L C6 transverse process to facilitate R rotation of C6 on C7.    Personal Factors and Comorbidities Fitness;Comorbidity 2    Comorbidities obese, R carpal tunnel    Examination-Activity Limitations Lift;Sit;Reach Overhead    Examination-Participation Restrictions Laundry;Cleaning;Meal Prep    Stability/Clinical Decision Making Stable/Uncomplicated    Rehab Potential Good    PT Frequency 2x / week    PT Duration 6 weeks    PT Treatment/Interventions Biofeedback;Cryotherapy;Electrical Stimulation;Therapeutic activities;Therapeutic exercise;Neuromuscular re-education;Manual techniques;Patient/family education;Dry needling;Spinal Manipulations;Joint Manipulations;ADLs/Self Care Home Management;Iontophoresis 4mg /ml Dexamethasone;Traction;Taping    PT Next Visit Plan Progress postural control interventions (add seated thoracic EXT against foam roller, serratus anterior - push up plus or punches in supine, upper trap/levator stretches). Continue manual therapy (STM, suboccipital release, myofascial release IASTM). Consider thread the needle, bird dogs (begin with UE only if needed), open books.    PT Home Exercise Plan [id number] - upper trap stretch    Consulted and Agree with Plan of Care Patient           Patient will benefit from skilled therapeutic intervention in order to improve the following deficits and impairments:  Decreased activity tolerance, Increased muscle spasms, Obesity, Pain, Decreased strength, Postural dysfunction,  Improper body mechanics  Visit Diagnosis: Radiculopathy of cervical spine  Muscle spasms of neck  Abnormal posture     Problem List Patient Active Problem List   Diagnosis Date Noted  . Carpal tunnel syndrome of right wrist 11/10/2019  . Symptomatic cholelithiasis 06/13/2018  . Postpartum care and examination 04/29/2018  . Urine frequency 04/29/2018  . Normal labor 03/18/2018  . 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 BMI 30-39.9 09/21/2017  . Obesity in pregnancy 09/21/2017  . Language barrier 09/21/2017  . Supervision of high risk pregnancy, antepartum 08/24/2017  . AMA (advanced maternal age) multigravida 35+ 08/24/2017  . HYPERTRIGLYCERIDEMIA 12/15/2006     Haydee Monica, PT, DPT 03/08/20 5:37 PM   [facility] [address] [place], [place], [address] Phone: [phone]   Fax:  [phone]  Name: Kari Ortega MRN: [id number] Date of Birth: 1980/10/13

## 2020-03-12 ENCOUNTER — Encounter: Payer: Self-pay | Admitting: Surgery

## 2020-03-12 ENCOUNTER — Ambulatory Visit (INDEPENDENT_AMBULATORY_CARE_PROVIDER_SITE_OTHER): Payer: Self-pay | Admitting: Surgery

## 2020-03-12 ENCOUNTER — Telehealth: Payer: Self-pay

## 2020-03-12 ENCOUNTER — Other Ambulatory Visit: Payer: Self-pay

## 2020-03-12 VITALS — BP 116/78 | HR 84 | Temp 98.5°F | Resp 12 | Ht 59.0 in | Wt 178.0 lb

## 2020-03-12 DIAGNOSIS — R1032 Left lower quadrant pain: Secondary | ICD-10-CM
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
## 2020-03-12 NOTE — Telephone Encounter (Addendum)
PT contacted Ms. Yoona by phone call via [place] [phone]) for a Spanish interpreter. Patient states she had a doctor's appointment in [place] at 2:30pm then was referred to go see a Psychologist, sport and exercise. Pt reports that plans to be at Thursday's appointment this week. Pt was asked if everything is okay and instructed to let us know if anything changes, but pt did not indicate that her appointment Thursday would be impacted by today's doctor's visit.  Interpreter: Felix Ahmadi #[id number]  Haydee Monica, PT, DPT 03/12/20 5:31 PM

## 2020-03-12 NOTE — Patient Instructions (Addendum)
Your CT Abdomen and Pelvis with contrast is scheduled for at 03/28/20 at 3:30pm at [facility]. Arrival time 3:15pm. Nothing to eat or drink 4 hours prior. Pick up prep kit prior.   See your appointment below. Call the office if you have any questions or concerns.   Su CT de abdomen y pelvis con contraste est programada para el 17/11/21 a las 3:30 pm en [employer]. Hora de llegada 3:15 pm. Nada para comer o beber 4 horas antes. Recoja el kit de preparacin antes.  [address] [place] [address]  Vea su cita a continuacin. Llame a la oficina si tiene alguna pregunta o inquietud.

## 2020-03-15 ENCOUNTER — Other Ambulatory Visit: Payer: Self-pay

## 2020-03-15 ENCOUNTER — Ambulatory Visit: Payer: Self-pay | Attending: Family Medicine | Admitting: Physical Therapy

## 2020-03-15 ENCOUNTER — Encounter: Payer: Self-pay | Admitting: Physical Therapy

## 2020-03-15 DIAGNOSIS — R293 Abnormal posture: Secondary | ICD-10-CM | POA: Insufficient documentation

## 2020-03-15 DIAGNOSIS — M79641 Pain in right hand: Secondary | ICD-10-CM | POA: Insufficient documentation

## 2020-03-15 DIAGNOSIS — M62838 Other muscle spasm: Secondary | ICD-10-CM | POA: Insufficient documentation

## 2020-03-15 DIAGNOSIS — M79642 Pain in left hand: Secondary | ICD-10-CM | POA: Insufficient documentation

## 2020-03-15 DIAGNOSIS — M5412 Radiculopathy, cervical region: Secondary | ICD-10-CM | POA: Insufficient documentation

## 2020-03-15 NOTE — Therapy (Signed)
[place], [place], [address] Phone: [phone]   Fax:  [phone]  Physical Therapy Treatment  Patient Details  Name: Kari Ortega MRN: [id number] Date of Birth: 11/05/1980 Referring Provider (PT): Aundra Dubin, [place]   Encounter Date: 03/15/2020   PT End of Session - 03/15/20 1636    Visit Number 5    Number of Visits 13    Date for PT Re-Evaluation 04/14/20    Authorization Type Self pay    PT Start Time 1633    PT Stop Time 1713    PT Time Calculation (min) 40 min    Activity Tolerance Patient tolerated treatment well    Behavior During Therapy [facility] for tasks assessed/performed           Past Medical History:  Diagnosis Date  . Anemia   . 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  Unable to determine reason per pt.    . Irregular periods     Past Surgical History:  Procedure Laterality Date  . CHOLECYSTECTOMY N/A 06/14/2018   Procedure: LAPAROSCOPIC CHOLECYSTECTOMY;  Surgeon: [name], [name], MD;  Location: [place];  Service: General;  Laterality: N/A;  . TOOTH EXTRACTION      There were no vitals filed for this visit.   Subjective Assessment - 03/15/20 1636    Subjective Numbness in finger is starting to come and go. Sometimes have HA- both sides across forehead. I feel a little bump or ball under Rt scapula.                             [place] Adult PT Treatment/Exercise - 03/15/20 0001      Lumbar Exercises: Stretches   Other Lumbar Stretch Exercise upper trap stretch      Lumbar Exercises: Aerobic   UBE (Upper Arm Bike) retro 3 min fwd 2 min L2      Lumbar Exercises: Standing   Row 20 reps    Theraband Level (Row) Level 3 (Green)    Other Standing Lumbar Exercises GHJ ER red tband      Manual Therapy   Manual therapy comments skilled palpation and monitoring during TPDN    Joint Mobilization bil first rib  depression, gross rib mobility & thoracic PA following DN                    PT Short Term Goals - 02/22/20 1630      PT SHORT TERM GOAL #1   Title Pt will be independent in her HEP.    Baseline Started on HEP    Status New    Target Date 03/14/20      PT SHORT TERM GOAL #2   Title Pt will voice understanding of measure to reduce and manage pain    Status New    Target Date 03/14/20      PT SHORT TERM GOAL #3   Title Pt will voice understanding of proper posture body mechanics and their impact on her pain    Status New    Target Date 03/14/20             PT Long Term Goals - 02/22/20 1635      PT LONG TERM GOAL #1   Title Pt will be able to self correct posture without cueing  in sitting and standing    Status New  Target Date 04/14/20      PT LONG TERM GOAL #2   Title Pt will report improved pain level with her daily activities of cleaning and cooking with 4/10 pain or less.    Baseline 3-8/10    Status New    Target Date 04/14/20      PT LONG TERM GOAL #3   Title Pt will demonstrate proper body mechanics for daily activities    Status New    Target Date 04/14/20      PT LONG TERM GOAL #4   Title Pt wil be ind in an advanced HE{P    Status New    Target Date 04/14/20                 Plan - 03/15/20 1715    Clinical Impression Statement concordant pain in Rt paraspinals around T8 and pt was able to relax upon palpation following. Multiple cavitations noted in thoracic region with grade 4 mobilizations. pt reported soreness as expected following treatment and I asked her to move around and take a hot shower later in the day.    PT Treatment/Interventions Biofeedback;Cryotherapy;Electrical Stimulation;Therapeutic activities;Therapeutic exercise;Neuromuscular re-education;Manual techniques;Patient/family education;Dry needling;Spinal Manipulations;Joint Manipulations;ADLs/Self Care Home Management;Iontophoresis 4mg /ml Dexamethasone;Traction;Taping     PT Next Visit Plan DN PRN, continue manual, thoracic mobility    PT Home Exercise Plan [id number] - upper trap stretch    Consulted and Agree with Plan of Care Patient           Patient will benefit from skilled therapeutic intervention in order to improve the following deficits and impairments:  Decreased activity tolerance, Increased muscle spasms, Obesity, Pain, Decreased strength, Postural dysfunction, Improper body mechanics  Visit Diagnosis: Radiculopathy of cervical spine  Muscle spasms of neck  Abnormal posture  Pain in right hand     Problem List Patient Active Problem List   Diagnosis Date Noted  . Carpal tunnel syndrome of right wrist 11/10/2019  . Symptomatic cholelithiasis 06/13/2018  . Postpartum care and examination 04/29/2018  . Urine frequency 04/29/2018  . Normal labor 03/18/2018  . 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 BMI 30-39.9 09/21/2017  . Obesity in pregnancy 09/21/2017  . Language barrier 09/21/2017  . Supervision of high risk pregnancy, antepartum 08/24/2017  . AMA (advanced maternal age) multigravida 35+ 08/24/2017  . HYPERTRIGLYCERIDEMIA 12/15/2006    Kari Ortega PT, DPT 03/15/20 5:17 PM   [facility] [address] [place], [place], [address] Phone: [phone]   Fax:  [phone]  Name: Kari Ortega MRN: [id number] Date of Birth: 08-12-1980

## 2020-03-20 ENCOUNTER — Encounter: Payer: Self-pay | Admitting: Physical Therapy

## 2020-03-20 ENCOUNTER — Ambulatory Visit: Payer: Self-pay | Admitting: Physical Therapy

## 2020-03-20 ENCOUNTER — Other Ambulatory Visit: Payer: Self-pay

## 2020-03-20 DIAGNOSIS — M62838 Other muscle spasm: Secondary | ICD-10-CM

## 2020-03-20 DIAGNOSIS — R293 Abnormal posture: Secondary | ICD-10-CM

## 2020-03-20 DIAGNOSIS — M5412 Radiculopathy, cervical region: Secondary | ICD-10-CM

## 2020-03-20 NOTE — Therapy (Signed)
[place], [place], [address] Phone: [phone]   Fax:  [phone]  Physical Therapy Treatment  Patient Details  Name: Kari Ortega MRN: [id number] Date of Birth: [birth year] Referring Provider (PT): Aundra Dubin, [place]   Encounter Date: 03/20/2020   PT End of Session - 03/20/20 1600    Visit Number 6    Number of Visits 13    Date for PT Re-Evaluation 04/14/20    Authorization Type Self pay    PT Start Time 1553    PT Stop Time 1633    PT Time Calculation (min) 40 min    Activity Tolerance Patient tolerated treatment well    Behavior During Therapy [facility] for tasks assessed/performed           Past Medical History:  Diagnosis Date  . Anemia   . 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  Unable to determine reason per pt.    . Irregular periods     Past Surgical History:  Procedure Laterality Date  . CHOLECYSTECTOMY N/A 06/14/2018   Procedure: LAPAROSCOPIC CHOLECYSTECTOMY;  Surgeon: [name], [name], MD;  Location: [place];  Service: General;  Laterality: N/A;  . TOOTH EXTRACTION      There were no vitals filed for this visit.   Subjective Assessment - 03/20/20 1557    Subjective Points to inferior Rt scapula for pain. Numbness is a  little better. I feel like I don't have as much strength in one side of my face. It pulls from temporal region down SCM when I open my mouth.    Patient Stated Goals Decreased pain and for numbness of my hand to feel better    Currently in Pain? Yes    Pain Score 5     Pain Location Back    Pain Orientation Right;Left    Pain Descriptors / Indicators Aching    Aggravating Factors  constant pain    Pain Relieving Factors massage                             [facility] Adult PT Treatment/Exercise - 03/20/20 0001      Neck Exercises: Seated   Other Seated Exercise external rotation green tband    Other  Seated Exercise thoracic extension over chair with deep breathing       Neck Exercises: Supine   Shoulder Abduction Limitations horiz abd green tband    Upper Extremity D2 Flexion;Extension    Theraband Level (UE D2) Level 3 (Green)      Manual Therapy   Manual therapy comments skilled palpation and monitoring during TPDN    Joint Mobilization thoracic PA, Rt rib cage mobilization    Soft tissue mobilization bil thoracic paraspinals      Neck Exercises: Stretches   Other Neck Stretches supine W chest stretch with deep breathing            Trigger Point Dry Needling - 03/20/20 0001    Other Dry Needling T5-7 bil paraspinals                PT Education - 03/20/20 1645    Education Details see plan    Person(s) Educated Patient    Methods Explanation;Handout    Comprehension Verbalized understanding;Need further instruction            PT Short Term Goals - 02/22/20 1630  PT SHORT TERM GOAL #1   Title Pt will be independent in her HEP.    Baseline Started on HEP    Status New    Target Date 03/14/20      PT SHORT TERM GOAL #2   Title Pt will voice understanding of measure to reduce and manage pain    Status New    Target Date 03/14/20      PT SHORT TERM GOAL #3   Title Pt will voice understanding of proper posture body mechanics and their impact on her pain    Status New    Target Date 03/14/20             PT Long Term Goals - 02/22/20 1635      PT LONG TERM GOAL #1   Title Pt will be able to self correct posture without cueing  in sitting and standing    Status New    Target Date 04/14/20      PT LONG TERM GOAL #2   Title Pt will report improved pain level with her daily activities of cleaning and cooking with 4/10 pain or less.    Baseline 3-8/10    Status New    Target Date 04/14/20      PT LONG TERM GOAL #3   Title Pt will demonstrate proper body mechanics for daily activities    Status New    Target Date 04/14/20      PT LONG TERM  GOAL #4   Title Pt wil be ind in an advanced HE{P    Status New    Target Date 04/14/20                 Plan - 03/20/20 1631    Clinical Impression Statement Tightness and concordant pain found along mid T-spine where she tends to hinge forward when slouching. We discussed posture and affects on neck region. Pt also expressed concerns about temporal region into TMJ and SCM- she has a diagnosis of facial paralysis and I told her we will evaluate at her next visit.    PT Treatment/Interventions Biofeedback;Cryotherapy;Electrical Stimulation;Therapeutic activities;Therapeutic exercise;Neuromuscular re-education;Manual techniques;Patient/family education;Dry needling;Spinal Manipulations;Joint Manipulations;ADLs/Self Care Home Management;Iontophoresis 4mg /ml Dexamethasone;Traction;Taping    PT Next Visit Plan evaluate Lt TMJ/temporal region.    PT Home Exercise Plan [id number] - upper trap stretch    Consulted and Agree with Plan of Care Patient           Patient will benefit from skilled therapeutic intervention in order to improve the following deficits and impairments:  Decreased activity tolerance, Increased muscle spasms, Obesity, Pain, Decreased strength, Postural dysfunction, Improper body mechanics  Visit Diagnosis: Radiculopathy of cervical spine  Muscle spasms of neck  Abnormal posture     Problem List Patient Active Problem List   Diagnosis Date Noted  . Carpal tunnel syndrome of right wrist 11/10/2019  . Symptomatic cholelithiasis 06/13/2018  . Postpartum care and examination 04/29/2018  . Urine frequency 04/29/2018  . Normal labor 03/18/2018  . 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 BMI 30-39.9 09/21/2017  . Obesity in pregnancy 09/21/2017  . Language barrier 09/21/2017  . Supervision of high risk pregnancy, antepartum 08/24/2017  . AMA (advanced maternal age) multigravida 35+ 08/24/2017  . HYPERTRIGLYCERIDEMIA 12/15/2006    Nadene Witherspoon C. Viola Kinnick PT,  DPT 03/20/20 4:46 PM   [place] [facility] [address] [place], [place], [address] Phone: [phone]   Fax:  [phone]  Name: Zakirah Weingart MRN:  [id number] Date of Birth: 02/20/1981

## 2020-03-22 ENCOUNTER — Other Ambulatory Visit: Payer: Self-pay

## 2020-03-22 ENCOUNTER — Encounter: Payer: Self-pay | Admitting: Physical Therapy

## 2020-03-22 ENCOUNTER — Ambulatory Visit: Payer: Self-pay | Admitting: Physical Therapy

## 2020-03-22 DIAGNOSIS — M79641 Pain in right hand: Secondary | ICD-10-CM

## 2020-03-22 DIAGNOSIS — M5412 Radiculopathy, cervical region: Secondary | ICD-10-CM

## 2020-03-22 DIAGNOSIS — R293 Abnormal posture: Secondary | ICD-10-CM

## 2020-03-22 DIAGNOSIS — M62838 Other muscle spasm: Secondary | ICD-10-CM

## 2020-03-22 NOTE — Therapy (Signed)
[place], [place], [address] Phone: [phone]   Fax:  [phone]  Physical Therapy Treatment  Patient Details  Name: Kari Ortega MRN: [id number] Date of Birth: [birth year] Referring Provider (PT): Aundra Dubin, [place]   Encounter Date: 03/22/2020   PT End of Session - 03/22/20 1556    Visit Number 7    Number of Visits 13    Date for PT Re-Evaluation 04/14/20    Authorization Type Self pay    PT Start Time 1555    PT Stop Time 1631    PT Time Calculation (min) 36 min    Activity Tolerance Patient tolerated treatment well    Behavior During Therapy [facility] for tasks assessed/performed           Past Medical History:  Diagnosis Date  . Anemia   . 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  Unable to determine reason per pt.    . Irregular periods     Past Surgical History:  Procedure Laterality Date  . CHOLECYSTECTOMY N/A 06/14/2018   Procedure: LAPAROSCOPIC CHOLECYSTECTOMY;  Surgeon: [name], [name], MD;  Location: [place];  Service: General;  Laterality: N/A;  . TOOTH EXTRACTION      There were no vitals filed for this visit.   Subjective Assessment - 03/22/20 1557    Subjective I am feeling a little better today. Last night I noticed that I have numbness in my hand. Especially today I am noticing more numbness. Right now I feel Rt arm pain and hand numbness but sometimes I feel them separately. Only feel the bottom half of the middle finger right now.    Patient Stated Goals Decreased pain and for numbness of my hand to feel better              [facility] PT Assessment - 03/22/20 0001      Sensation   Additional Comments decreased sensation on Rt side of face      Strength   Overall Strength Comments unable to activate frontalis, temporalis or masster. tongue control ok                         [facility] Adult PT Treatment/Exercise -  03/22/20 0001      Manual Therapy   Manual therapy comments prewrap roll for wrist extensor compression    Soft tissue mobilization IASTM Rt wrist extensor group                  PT Education - 03/22/20 1959    Education Details anatomy of condition, follow ups    Person(s) Educated Patient    Methods Explanation    Comprehension Verbalized understanding;Need further instruction            PT Short Term Goals - 02/22/20 1630      PT SHORT TERM GOAL #1   Title Pt will be independent in her HEP.    Baseline Started on HEP    Status New    Target Date 03/14/20      PT SHORT TERM GOAL #2   Title Pt will voice understanding of measure to reduce and manage pain    Status New    Target Date 03/14/20      PT SHORT TERM GOAL #3   Title Pt will voice understanding of proper posture body mechanics and their impact on her pain  Status New    Target Date 03/14/20             PT Long Term Goals - 02/22/20 1635      PT LONG TERM GOAL #1   Title Pt will be able to self correct posture without cueing  in sitting and standing    Status New    Target Date 04/14/20      PT LONG TERM GOAL #2   Title Pt will report improved pain level with her daily activities of cleaning and cooking with 4/10 pain or less.    Baseline 3-8/10    Status New    Target Date 04/14/20      PT LONG TERM GOAL #3   Title Pt will demonstrate proper body mechanics for daily activities    Status New    Target Date 04/14/20      PT LONG TERM GOAL #4   Title Pt wil be ind in an advanced HE{P    Status New    Target Date 04/14/20                 Plan - 03/22/20 2000    Clinical Impression Statement Pt is wearing night braces on her wrists which helps with numbness. Noted to have significant tightness in wrist extensor group and reduced with IASTM. Used prewrap to create compression brace to see how she does. UE pain does not follow neuro pattern. Pt did ask that I evaluate her facial  musculature- She has tension in facial musculature but with limited activation and decreased sensation I do not feel it is appropriate to DN. Will request referral to neuro to further discuss facial paralysis.    PT Treatment/Interventions Biofeedback;Cryotherapy;Electrical Stimulation;Therapeutic activities;Therapeutic exercise;Neuromuscular re-education;Manual techniques;Patient/family education;Dry needling;Spinal Manipulations;Joint Manipulations;ADLs/Self Care Home Management;Iontophoresis 4mg /ml Dexamethasone;Traction;Taping    PT Next Visit Plan outcome of treatment to wrist extensors, address STGs.    PT Home Exercise Plan [id number] - upper trap stretch    Consulted and Agree with Plan of Care Patient           Patient will benefit from skilled therapeutic intervention in order to improve the following deficits and impairments:  Decreased activity tolerance, Increased muscle spasms, Obesity, Pain, Decreased strength, Postural dysfunction, Improper body mechanics  Visit Diagnosis: Radiculopathy of cervical spine  Muscle spasms of neck  Abnormal posture  Pain in right hand     Problem List Patient Active Problem List   Diagnosis Date Noted  . Carpal tunnel syndrome of right wrist 11/10/2019  . Symptomatic cholelithiasis 06/13/2018  . Postpartum care and examination 04/29/2018  . Urine frequency 04/29/2018  . Normal labor 03/18/2018  . 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 BMI 30-39.9 09/21/2017  . Obesity in pregnancy 09/21/2017  . Language barrier 09/21/2017  . Supervision of high risk pregnancy, antepartum 08/24/2017  . AMA (advanced maternal age) multigravida 35+ 08/24/2017  . HYPERTRIGLYCERIDEMIA 12/15/2006    Lizvet Chunn C. Charlene Cowdrey PT, DPT 03/22/20 8:11 PM   [facility] [address] [place], [place], [address] Phone: [phone]   Fax:  [phone]  Name: Kari Ortega MRN: [id number] Date of  Birth: [birth year]

## 2020-03-23 ENCOUNTER — Other Ambulatory Visit: Payer: Self-pay | Admitting: Family Medicine

## 2020-03-23 DIAGNOSIS — G510 Bell's palsy: Secondary | ICD-10-CM

## 2020-03-23 NOTE — Progress Notes (Signed)
Patient ID: Kari Ortega, female   DOB: May 18, 1980, 39 y.o.   MRN: [id number]   Patient with history of facial paralysis following childbirth and is currently being seen by physical medicine and rehab secondary to cervical radiculopathy and patient per rehabilitation has questions regarding dry needling to help with facial paralysis.  Message received from rehabilitation requesting neurology referral for patient.

## 2020-03-24 ENCOUNTER — Telehealth: Payer: Self-pay | Admitting: Physical Therapy

## 2020-03-24 NOTE — Telephone Encounter (Signed)
Spoke with patient via interpreter Garlon Hatchet- advised that referral has been placed to neuro for facial paralysis. They will contact her to schedule.  Angie Hogg C. Ovella Manygoats PT, DPT 03/24/20 11:47 AM

## 2020-03-27 ENCOUNTER — Encounter: Payer: Self-pay | Admitting: Physical Therapy

## 2020-03-27 ENCOUNTER — Ambulatory Visit: Payer: Self-pay | Admitting: Physical Therapy

## 2020-03-27 ENCOUNTER — Other Ambulatory Visit: Payer: Self-pay

## 2020-03-27 ENCOUNTER — Encounter: Payer: Self-pay | Admitting: Neurology

## 2020-03-27 DIAGNOSIS — M62838 Other muscle spasm: Secondary | ICD-10-CM

## 2020-03-27 DIAGNOSIS — M5412 Radiculopathy, cervical region: Secondary | ICD-10-CM

## 2020-03-27 DIAGNOSIS — R293 Abnormal posture: Secondary | ICD-10-CM

## 2020-03-27 NOTE — Therapy (Signed)
[place], [place], [address] Phone: [phone]   Fax:  [phone]  Physical Therapy Treatment  Patient Details  Name: Kari Ortega MRN: [id number] Date of Birth: 06/17/1980 Referring Provider (PT): Aundra Dubin, [place]   Encounter Date: 03/27/2020   PT End of Session - 03/27/20 1554    Visit Number 8    Number of Visits 13    Date for PT Re-Evaluation 04/14/20    Authorization Type Self pay    PT Start Time 1548    PT Stop Time 1629    PT Time Calculation (min) 41 min    Activity Tolerance Patient tolerated treatment well    Behavior During Therapy [facility] for tasks assessed/performed           Past Medical History:  Diagnosis Date  . Anemia   . 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  Unable to determine reason per pt.    . Irregular periods     Past Surgical History:  Procedure Laterality Date  . CHOLECYSTECTOMY N/A 06/14/2018   Procedure: LAPAROSCOPIC CHOLECYSTECTOMY;  Surgeon: [name], [name], MD;  Location: [facility];  Service: General;  Laterality: N/A;  . TOOTH EXTRACTION      There were no vitals filed for this visit.   Subjective Assessment - 03/27/20 1550    Subjective appointment for facial paralysis in Jan. Just a little bit of pain along bil upper traps. IASTM took the pain away. right now it only hurts just a little bit on dorsal aspect of wrist. Points to right shoulder and into scapula when describing pain.    Patient Stated Goals Decreased pain and for numbness of my hand to feel better                             [facility] Adult PT Treatment/Exercise - 03/27/20 0001      Neck Exercises: Supine   Shoulder Flexion Limitations with horiz abd pull on red tband    Shoulder Abduction Limitations horiz abd red tband    Other Supine Exercise supine W red tband      Neck Exercises: Prone   Other Prone Exercise qped row  red tband      Manual Therapy   Soft tissue mobilization IASTM & trigger point release bil upper & mid traps, bil rhomboids, levator scap      Neck Exercises: Stretches   Other Neck Stretches supine chest stretch                  PT Education - 03/27/20 1630    Education Details incr time required for subjective & education on exercises due to lag in intperpretation system    Person(s) Educated Patient    Methods Explanation;Handout    Comprehension Verbalized understanding;Need further instruction            PT Short Term Goals - 03/27/20 1555      PT SHORT TERM GOAL #1   Title Pt will be independent in her HEP.    Status Achieved      PT SHORT TERM GOAL #2   Title Pt will voice understanding of measure to reduce and manage pain    Status Achieved      PT SHORT TERM GOAL #3   Title Pt will voice understanding of proper posture body mechanics and their impact on her pain  Baseline I try to fix my posture during the day because it hurts when I have bad posture.    Status Achieved             PT Long Term Goals - 02/22/20 1635      PT LONG TERM GOAL #1   Title Pt will be able to self correct posture without cueing  in sitting and standing    Status New    Target Date 04/14/20      PT LONG TERM GOAL #2   Title Pt will report improved pain level with her daily activities of cleaning and cooking with 4/10 pain or less.    Baseline 3-8/10    Status New    Target Date 04/14/20      PT LONG TERM GOAL #3   Title Pt will demonstrate proper body mechanics for daily activities    Status New    Target Date 04/14/20      PT LONG TERM GOAL #4   Title Pt wil be ind in an advanced HE{P    Status New    Target Date 04/14/20                 Plan - 03/27/20 1629    Clinical Impression Statement Incr time required for subjective today due to lag in interpreter system. trigger points reduced in bil upper traps & periscap regions which decreased concordant  pain. Exercises in supine & quadruped to control shoulder elevation and progressed HEP.    PT Treatment/Interventions Biofeedback;Cryotherapy;Electrical Stimulation;Therapeutic activities;Therapeutic exercise;Neuromuscular re-education;Manual techniques;Patient/family education;Dry needling;Spinal Manipulations;Joint Manipulations;ADLs/Self Care Home Management;Iontophoresis 4mg /ml Dexamethasone;Traction;Taping    PT Next Visit Plan end of POC coming soon. add free weights.    PT Home Exercise Plan [id number] - upper trap stretch    Consulted and Agree with Plan of Care Patient           Patient will benefit from skilled therapeutic intervention in order to improve the following deficits and impairments:  Decreased activity tolerance, Increased muscle spasms, Obesity, Pain, Decreased strength, Postural dysfunction, Improper body mechanics  Visit Diagnosis: Radiculopathy of cervical spine  Muscle spasms of neck  Abnormal posture     Problem List Patient Active Problem List   Diagnosis Date Noted  . Carpal tunnel syndrome of right wrist 11/10/2019  . Symptomatic cholelithiasis 06/13/2018  . Postpartum care and examination 04/29/2018  . Urine frequency 04/29/2018  . Normal labor 03/18/2018  . 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 BMI 30-39.9 09/21/2017  . Obesity in pregnancy 09/21/2017  . Language barrier 09/21/2017  . Supervision of high risk pregnancy, antepartum 08/24/2017  . AMA (advanced maternal age) multigravida 35+ 08/24/2017  . HYPERTRIGLYCERIDEMIA 12/15/2006    Churchill Grimsley C. Adrieana Fennelly PT, DPT 03/27/20 4:32 PM   [place], [place], [address] Phone: [phone]   Fax:  [phone]  Name: Isla Sabree MRN: [id number] Date of Birth: [birth year]

## 2020-03-28 ENCOUNTER — Ambulatory Visit (HOSPITAL_COMMUNITY)
Admission: RE | Admit: 2020-03-28 | Discharge: 2020-03-28 | Disposition: A | Discharge status: Home / Self Care | Payer: Self-pay | Source: Ambulatory Visit | Attending: Surgery | Admitting: Surgery

## 2020-03-28 DIAGNOSIS — R1032 Left lower quadrant pain: Secondary | ICD-10-CM | POA: Insufficient documentation

## 2020-03-28 MED ORDER — IOHEXOL 300 MG/ML  SOLN
100.0000 mL | Freq: Once | INTRAMUSCULAR | Status: AC | PRN
  Administered 2020-03-28: 100 mL via INTRAVENOUS

## 2020-03-29 ENCOUNTER — Ambulatory Visit: Payer: Self-pay | Admitting: Physical Therapy

## 2020-03-29 ENCOUNTER — Encounter: Payer: Self-pay | Admitting: Physical Therapy

## 2020-03-29 ENCOUNTER — Other Ambulatory Visit: Payer: Self-pay

## 2020-03-29 DIAGNOSIS — M79641 Pain in right hand: Secondary | ICD-10-CM

## 2020-03-29 DIAGNOSIS — R293 Abnormal posture: Secondary | ICD-10-CM

## 2020-03-29 DIAGNOSIS — M5412 Radiculopathy, cervical region: Secondary | ICD-10-CM

## 2020-03-29 DIAGNOSIS — M62838 Other muscle spasm: Secondary | ICD-10-CM

## 2020-03-29 NOTE — Therapy (Signed)
[place], [place], [address] Phone: [phone]   Fax:  [phone]  Physical Therapy Treatment  Patient Details  Name: Kari Ortega MRN: [id number] Date of Birth: 06/14/1980 Referring Provider (PT): Aundra Dubin, [place]   Encounter Date: 03/29/2020   PT End of Session - 03/29/20 1552    Visit Number 9    Number of Visits 13    Date for PT Re-Evaluation 04/14/20    Authorization Type Self pay    PT Start Time 1545    PT Stop Time 1629    PT Time Calculation (min) 44 min    Activity Tolerance Patient tolerated treatment well    Behavior During Therapy [facility] for tasks assessed/performed           Past Medical History:  Diagnosis Date  . Anemia   . 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  Unable to determine reason per pt.    . Irregular periods     Past Surgical History:  Procedure Laterality Date  . CHOLECYSTECTOMY N/A 06/14/2018   Procedure: LAPAROSCOPIC CHOLECYSTECTOMY;  Surgeon: [name], [name], MD;  Location: [facility];  Service: General;  Laterality: N/A;  . TOOTH EXTRACTION      There were no vitals filed for this visit.   Subjective Assessment - 03/29/20 1548    Subjective Right now I am having some back pain across my back and some across my neck. It is a lot better but I was feeling some soreness after last treatment and the day after. Barely have any pain with my hand but a little pain in Rt deltoid region. C/o thoracic region pain that wraps around to the front.    Patient Stated Goals Decreased pain and for numbness of my hand to feel better    Currently in Pain? Yes    Pain Score 5     Pain Location Back    Pain Orientation Mid    Pain Descriptors / Indicators Stabbing    Aggravating Factors  deep breath    Pain Relieving Factors unknown                             [facility] Adult PT Treatment/Exercise - 03/29/20 0001       Neck Exercises: Standing   Other Standing Exercises 3lb bent over row & bent over W row    Other Standing Exercises GHJ scaption back to wall      Neck Exercises: Prone   W Back 15 reps    Shoulder Extension Limitations scap retraction + GHJ extension    Other Prone Exercise scpaular retraction      Manual Therapy   Joint Mobilization thoracic PA, rib mobilizations & holds with breathing    Soft tissue mobilization Rt middle trap, rhomboid      Neck Exercises: Stretches   Other Neck Stretches thoracic extension over pillows in supine    Other Neck Stretches L stretch using chair                    PT Short Term Goals - 03/27/20 1555      PT SHORT TERM GOAL #1   Title Pt will be independent in her HEP.    Status Achieved      PT SHORT TERM GOAL #2   Title Pt will voice understanding of measure to reduce and manage pain  Status Achieved      PT SHORT TERM GOAL #3   Title Pt will voice understanding of proper posture body mechanics and their impact on her pain    Baseline I try to fix my posture during the day because it hurts when I have bad posture.    Status Achieved             PT Long Term Goals - 02/22/20 1635      PT LONG TERM GOAL #1   Title Pt will be able to self correct posture without cueing  in sitting and standing    Status New    Target Date 04/14/20      PT LONG TERM GOAL #2   Title Pt will report improved pain level with her daily activities of cleaning and cooking with 4/10 pain or less.    Baseline 3-8/10    Status New    Target Date 04/14/20      PT LONG TERM GOAL #3   Title Pt will demonstrate proper body mechanics for daily activities    Status New    Target Date 04/14/20      PT LONG TERM GOAL #4   Title Pt wil be ind in an advanced HE{P    Status New    Target Date 04/14/20                 Plan - 03/29/20 1620    Clinical Impression Statement progressed to free weights and focus on scapular motion.  concordant pain found in t5 rib and reduced with manual therapy. Cavitations noted in upper thoracic region with Grade 4 pressure.    PT Treatment/Interventions Biofeedback;Cryotherapy;Electrical Stimulation;Therapeutic activities;Therapeutic exercise;Neuromuscular re-education;Manual techniques;Patient/family education;Dry needling;Spinal Manipulations;Joint Manipulations;ADLs/Self Care Home Management;Iontophoresis 4mg /ml Dexamethasone;Traction;Taping    PT Next Visit Plan end of POC coming soon. lifting    PT Home Exercise Plan [id number] - upper trap stretch    Consulted and Agree with Plan of Care Patient           Patient will benefit from skilled therapeutic intervention in order to improve the following deficits and impairments:  Decreased activity tolerance, Increased muscle spasms, Obesity, Pain, Decreased strength, Postural dysfunction, Improper body mechanics  Visit Diagnosis: Radiculopathy of cervical spine  Muscle spasms of neck  Abnormal posture  Pain in right hand     Problem List Patient Active Problem List   Diagnosis Date Noted  . Carpal tunnel syndrome of right wrist 11/10/2019  . Symptomatic cholelithiasis 06/13/2018  . Postpartum care and examination 04/29/2018  . Urine frequency 04/29/2018  . Normal labor 03/18/2018  . 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 BMI 30-39.9 09/21/2017  . Obesity in pregnancy 09/21/2017  . Language barrier 09/21/2017  . Supervision of high risk pregnancy, antepartum 08/24/2017  . AMA (advanced maternal age) multigravida 35+ 08/24/2017  . HYPERTRIGLYCERIDEMIA 12/15/2006   Khamani Fairley C. Lina Hitch PT, DPT 03/29/20 4:32 PM   [place], [place], [address] Phone: [phone]   Fax:  [phone]  Name: Kari Ortega MRN: [id number] Date of Birth: Jan 17, 1981

## 2020-04-02 ENCOUNTER — Ambulatory Visit (INDEPENDENT_AMBULATORY_CARE_PROVIDER_SITE_OTHER): Payer: Self-pay | Admitting: Surgery

## 2020-04-02 ENCOUNTER — Other Ambulatory Visit: Payer: Self-pay

## 2020-04-02 ENCOUNTER — Encounter: Payer: Self-pay | Admitting: Surgery

## 2020-04-02 VITALS — BP 119/81 | HR 78 | Temp 98.6°F | Wt 176.8 lb

## 2020-04-02 DIAGNOSIS — R1032 Left lower quadrant pain: Secondary | ICD-10-CM

## 2020-04-02 NOTE — Patient Instructions (Addendum)
May take Advil and Ibuprofen for pain as needed for muscular pain. Call the office as needed and if the pain does not go away after 3 months.   Dolor musculoesquelticoCuando Conservation officer, historic buildings es intenso Musculoskeletal Pain "Dolor musculoesqueltico" hace referencia a los dolores y las [employer], las articulaciones, los msculos y los tejidos que los rodean. Este dolor puede ocurrir en cualquier parte del cuerpo. Puede durar un breve perodo (agudo) o prolongarse mucho tiempo (crnico). Es posible que se realicen un examen fsico, anlisis de laboratorio y estudios de diagnstico por imgenes para Animator causa del dolor musculoesqueltico. Siga estas indicaciones en su casa:  Estilo de vida  Trate de controlar o reducir los niveles de estrs. El estrs aumenta la tensin muscular y Biochemist, clinical musculoesqueltico. Es importante reconocer cuando est ansioso o estresado y aprender distintas formas de Market researcher. Estas pueden incluir, entre [place], las siguientes: ? Yoga o meditacin. ? Terapia cognitiva o conductual. ? Acupuntura o terapia de masajes.  Podr seguir con todas las actividades, a menos que Magazine features editor generen ms [employer]. Cuando el dolor Dodge, retome las actividades habituales de a poco. Aumente gradualmente la intensidad y la duracin de las actividades o del ejercicio que realice. Control del dolor, la rigidez y Engineer, structural los medicamentos de venta libre y los recetados solamente como se lo haya indicado el mdico.  Si el dolor es intenso, el reposo en cama puede ser beneficioso. Acustese o sintese en cualquier posicin que sea cmoda, pero salga de la cama y camine al [employer].  Si se lo indican, aplique calor en la zona afectada con la frecuencia que le haya indicado el mdico. Use la fuente de calor que el mdico le recomiende, como una compresa de calor hmedo o una almohadilla trmica. ? Coloque una [employer]  piel y la fuente de Freight forwarder. ? Aplique el calor durante un perodo de 20a47minutos. ? Retire la fuente de calor si la piel se pone de color rojo brillante. Esto es especialmente importante si no puede sentir dolor, calor o fro. Puede correr un riesgo mayor de sufrir quemaduras.  Si se lo indican, aplique hielo sobre la zona dolorida. ? Ponga el hielo en una bolsa plstica. ? Coloque una [employer] piel y la bolsa de hielo. ? Coloque el hielo durante 82minutos, de 2a3veces por da. Instrucciones generales  El mdico puede recomendarle que consulte a un fisioterapeuta. Esta persona puede ayudarlo a elaborar un programa de ejercicios seguro. Haga ejercicios como se lo haya indicado el fisioterapeuta.  Concurra a todas las visitas de control, incluidas las sesiones de fisioterapia, como se lo hayan indicado los mdicos. Esto es importante. Comunquese con un mdico si:  El Holiday representative.  Los medicamentos no Financial trader.  No puede usar la parte del cuerpo que le duele, como un brazo, una pierna o el cuello.  Tiene dificultad para dormir.  Tiene dificultad para [employer] Corporation cotidianas. Solicite ayuda de inmediato si:  Tiene una nueva lesin o el dolor empeora o es diferente.  Tiene adormecimiento u hormigueo en la zona dolorida. Resumen  "Dolor musculoesqueltico" hace referencia a los dolores y las [employer] Companies, las articulaciones, los msculos y los tejidos [employer] rodean.  Este dolor puede ocurrir en cualquier parte del cuerpo.  El mdico puede recomendarle que consulte a un fisioterapeuta. Esta persona puede ayudarlo a elaborar un programa de ejercicios seguro. Kari Ortega  ejercicios como se lo haya indicado el fisioterapeuta.  Disminuya su nivel de estrs. El estrs puede Occupational hygienist musculoesqueltico. [place] los mtodos para disminuir el estrs se pueden mencionar la meditacin, el yoga, la terapia cognitiva o conductual, la acupuntura y la  terapia de [place]. Esta informacin no tiene Marine scientist el consejo del mdico. Asegrese de hacerle al mdico cualquier pregunta que tenga. Document Revised: 01/26/2017 Document Reviewed: 01/26/2017 Elsevier Patient Education  [name].

## 2020-04-02 NOTE — Progress Notes (Signed)
04/02/2020  History of Present Illness: Kari Ortega is a 39 y.o. female presenting for follow-up of left lower quadrant abdominal pain.  Patient had a CT scan of the abdomen and pelvis with contrast on 11/17 which did not show any intra-abdominal pathology.  I have personally reviewed the images.  There is no evidence of any diverticulosis or diverticulitis or any intra-abdominal masses.  There is no evidence of any hernia for appendicitis.  Today the patient reports that her left lower quadrant abdominal pain has significantly improved and is only sore or tender when she presses deeply in that area.  Past Medical History: Past Medical History:  Diagnosis Date  . Anemia   . 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  Unable to determine reason per pt.    . Irregular periods      Past Surgical History: Past Surgical History:  Procedure Laterality Date  . CHOLECYSTECTOMY N/A 06/14/2018   Procedure: LAPAROSCOPIC CHOLECYSTECTOMY;  Surgeon: [name], [name], MD;  Location: [facility];  Service: General;  Laterality: N/A;  . TOOTH EXTRACTION      Home Medications: Prior to Admission medications   Medication Sig Start Date End Date Taking? Authorizing Provider  acetaminophen (TYLENOL) 500 MG tablet Take 2 tablets (1,000 mg total) by mouth every 6 (six) hours as needed. 06/14/18  Yes Saverio Danker, PA-C  Cholecalciferol (VITAMIN D-3) 125 MCG (5000 UT) TABS Take 1 tablet by mouth daily. 03/18/19  Yes Hilts, Legrand Como, MD  Sunscreen SPF50 LOTN Apply 1 application topically in the morning, at noon, and at bedtime. 10/18/19  Yes Camillia Herter, NP    Allergies: No Known Allergies  Review of Systems: Review of Systems  Constitutional: Negative for chills and fever.  Respiratory: Negative for shortness of breath.   Cardiovascular: Negative for chest pain.  Gastrointestinal: Positive for abdominal pain (improving). Negative for constipation, diarrhea, nausea and  vomiting.    Physical Exam BP 119/81   Pulse 78   Temp 98.6 F (37 C) (Oral)   Wt 176 lb 12.8 oz (80.2 kg)   SpO2 97%   BMI 35.71 kg/m  CONSTITUTIONAL: No acute distress HEENT:  Normocephalic, atraumatic, extraocular motion intact. RESPIRATORY:  Normal respiratory effort without pathologic use of accessory muscles. CARDIOVASCULAR: Regular rhythm and rate. GI: The abdomen is soft, nondistended, with no significant tenderness to palpation.  The patient only has some tenderness in the left lower quadrant near the ASIS area with very deep palpation. NEUROLOGIC:  Motor and sensation is grossly normal.  Cranial nerves are grossly intact. PSYCH:  Alert and oriented to person, place and time. Affect is normal.  Labs/Imaging: CT scan abdomen and pelvis on 03/28/2020: IMPRESSION: No acute intra-abdominal or pelvic pathology. No bowel obstruction. Normal appendix.  Assessment and Plan: This is a 39 y.o. female with improving left lower quadrant abdominal pain.  -The patient reports that she started having this pain after doing heavy lifting picking up a big package of water bottles.  However currently her pain has improved significantly and is only present with very deep palpation of the left lower quadrant.  Discussed with her that this most likely could be a musculoskeletal issue such as a pull or injury of the psoas muscle.  At this point there is no surgical needs and she does not require any surgery or procedures.  Discussed with her that she can take Tylenol or ibuprofen as needed for pain but that this should be improving over the  next few weeks.  She should refrain from doing any strenuous activity to allow for that area to heal.  She should contact us if she continues to have pain or any worsening issues in about 3 months.  Otherwise she may follow-up with [place] as needed.  Face-to-face time spent with the patient and care providers was 15 minutes, with more than 50% of the time spent  counseling, educating, and coordinating care of the patient.     Melvyn Neth, [facility]

## 2020-04-03 ENCOUNTER — Ambulatory Visit: Payer: Self-pay | Admitting: Physical Therapy

## 2020-04-03 DIAGNOSIS — R293 Abnormal posture: Secondary | ICD-10-CM

## 2020-04-03 DIAGNOSIS — M79641 Pain in right hand: Secondary | ICD-10-CM

## 2020-04-03 DIAGNOSIS — M5412 Radiculopathy, cervical region: Secondary | ICD-10-CM

## 2020-04-03 DIAGNOSIS — M79642 Pain in left hand: Secondary | ICD-10-CM

## 2020-04-03 DIAGNOSIS — M62838 Other muscle spasm: Secondary | ICD-10-CM

## 2020-04-03 NOTE — Therapy (Signed)
[place], [place], [address] Phone: [phone]   Fax:  [phone]  Physical Therapy Treatment  Patient Details  Name: Kari Ortega MRN: [id number] Date of Birth: 10-24-1980 Referring Provider (PT): Aundra Dubin, [place]   Encounter Date: 04/03/2020   PT End of Session - 04/03/20 1548    Visit Number 10    Number of Visits 13    Date for PT Re-Evaluation 04/14/20    Authorization Type Self pay    PT Start Time 1546    PT Stop Time 1630    PT Time Calculation (min) 44 min    Activity Tolerance Patient tolerated treatment well    Behavior During Therapy [facility] for tasks assessed/performed           Past Medical History:  Diagnosis Date  . Anemia   . 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  Unable to determine reason per pt.    . Irregular periods     Past Surgical History:  Procedure Laterality Date  . CHOLECYSTECTOMY N/A 06/14/2018   Procedure: LAPAROSCOPIC CHOLECYSTECTOMY;  Surgeon: [name], [name], MD;  Location: [facility];  Service: General;  Laterality: N/A;  . TOOTH EXTRACTION      There were no vitals filed for this visit.   Subjective Assessment - 04/03/20 1548    Subjective I am experiencing some pain here (rubbing Rt upper trap) and it is running up into my head. Very little numbness in hand. Middle finger I do not feel the numbness much in comparison to before.    Patient is accompained by: Interpreter   Cory Roughen [phone]   Patient Stated Goals Decreased pain and for numbness of my hand to feel better                             [facility] Adult PT Treatment/Exercise - 04/03/20 0001      Neck Exercises: Standing   Other Standing Exercises bent over row 10lb kettle bell    Other Standing Exercises GHJ scaption back to wall, 1lb in Rt hand      Neck Exercises: Seated   Other Seated Exercise 10lb biceps curl with cues for scap  retraction    Other Seated Exercise 10lb kettle bell overhead reach      Manual Therapy   Soft tissue mobilization IASTM Rt upper trap, levator scap, STM Rt infraspinatus      Neck Exercises: Stretches   Upper Trapezius Stretch Right;20 seconds    Levator Stretch Right;20 seconds                  PT Education - 04/03/20 1552    Education Details carpal tunnel contributing to numbness in hand, avoiding reaching high levels of pain, wrapping up POC & independence    Person(s) Educated Patient    Methods Explanation    Comprehension Verbalized understanding            PT Short Term Goals - 03/27/20 1555      PT SHORT TERM GOAL #1   Title Pt will be independent in her HEP.    Status Achieved      PT SHORT TERM GOAL #2   Title Pt will voice understanding of measure to reduce and manage pain    Status Achieved      PT SHORT TERM GOAL #3   Title Pt will voice understanding  of proper posture body mechanics and their impact on her pain    Baseline I try to fix my posture during the day because it hurts when I have bad posture.    Status Achieved             PT Long Term Goals - 04/03/20 1554      PT LONG TERM GOAL #1   Title Pt will be able to self correct posture without cueing  in sitting and standing    Baseline understands proper posture and is able to demonstrate    Status Achieved      PT LONG TERM GOAL #2   Title Pt will report improved pain level with her daily activities of cleaning and cooking with 4/10 pain or less.    Baseline up to about a 6/10    Status On-going      PT LONG TERM GOAL #3   Title Pt will demonstrate proper body mechanics for daily activities    Status On-going      PT LONG TERM GOAL #4   Title Pt wil be ind in an advanced HE{P    Baseline doing exercises daily    Status Achieved                 Plan - 04/03/20 1559    Clinical Impression Statement Asked her to keep pain <3/10, pause and take breaks and stretch when  she gets to this level. increased lifting weight to 10lb kettle bell with cues to avoid compensation through upper trap. Advised that regular STM at home will be important to maintain low levels of pain.    PT Treatment/Interventions Biofeedback;Cryotherapy;Electrical Stimulation;Therapeutic activities;Therapeutic exercise;Neuromuscular re-education;Manual techniques;Patient/family education;Dry needling;Spinal Manipulations;Joint Manipulations;ADLs/Self Care Home Management;Iontophoresis 4mg /ml Dexamethasone;Traction;Taping    PT Next Visit Plan d/c    PT Home Exercise Plan [id number] - upper trap stretch    Consulted and Agree with Plan of Care Patient           Patient will benefit from skilled therapeutic intervention in order to improve the following deficits and impairments:  Decreased activity tolerance, Increased muscle spasms, Obesity, Pain, Decreased strength, Postural dysfunction, Improper body mechanics  Visit Diagnosis: Radiculopathy of cervical spine  Muscle spasms of neck  Abnormal posture  Pain in right hand  Pain in left hand     Problem List Patient Active Problem List   Diagnosis Date Noted  . Carpal tunnel syndrome of right wrist 11/10/2019  . Symptomatic cholelithiasis 06/13/2018  . Postpartum care and examination 04/29/2018  . Urine frequency 04/29/2018  . Normal labor 03/18/2018  . 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 BMI 30-39.9 09/21/2017  . Obesity in pregnancy 09/21/2017  . Language barrier 09/21/2017  . Supervision of high risk pregnancy, antepartum 08/24/2017  . AMA (advanced maternal age) multigravida 35+ 08/24/2017  . HYPERTRIGLYCERIDEMIA 12/15/2006    Magnus Crescenzo C. Burtis Imhoff PT, DPT 04/03/20 5:22 PM   [facility] [address] [place], [place], [address] Phone: [phone]   Fax:  [phone]  Name: Kari Ortega MRN: [id number] Date of Birth: [birth year]

## 2020-04-10 ENCOUNTER — Ambulatory Visit: Payer: Self-pay | Admitting: Physical Therapy

## 2020-04-14 IMAGING — US US MFM OB FOLLOW-UP
1 series · 14 of 28 positions shown · non-contrast
Comparison: none

[Series 1: us mfm ob follow-up · 14 of 65 slices shown]
[im 3/65]
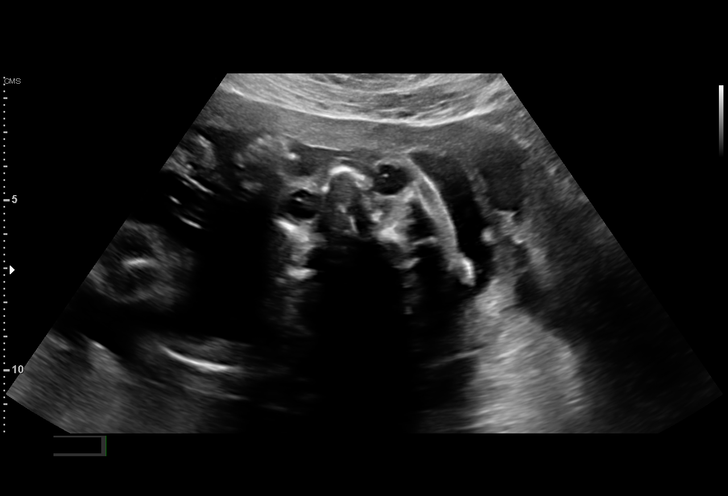
[im 8/65]
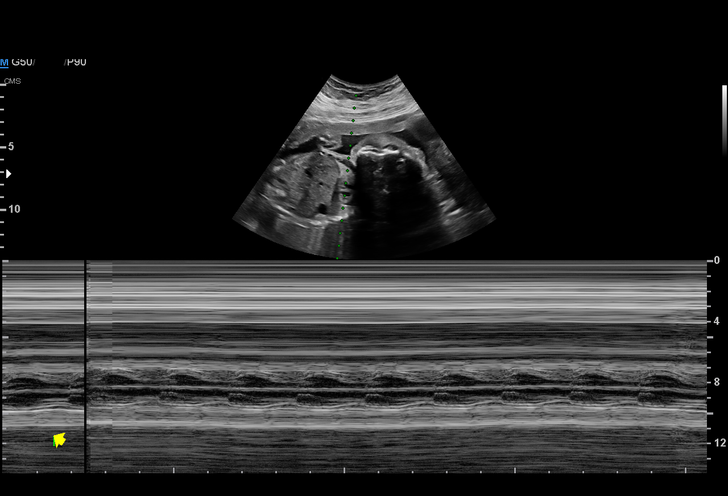
[im 12/65]
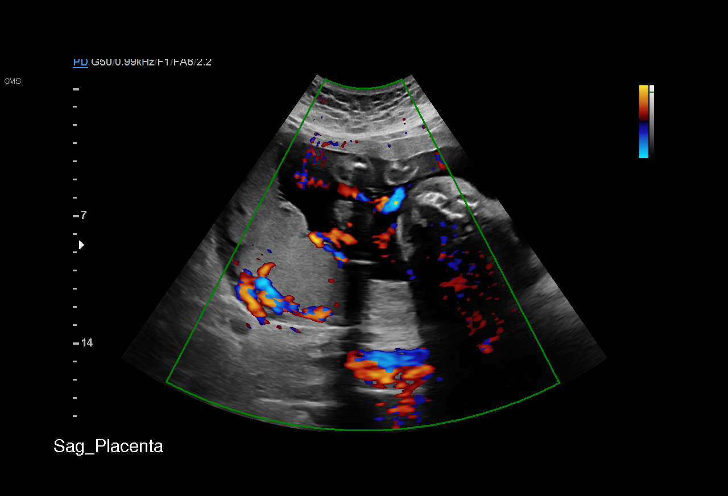
[im 17/65]
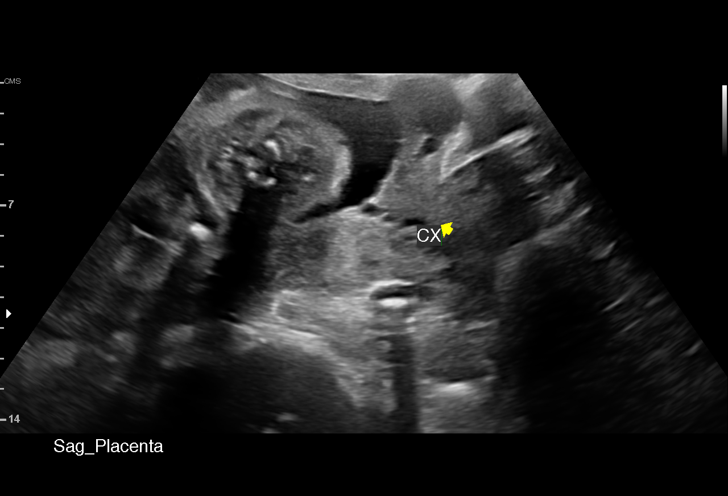
[im 22/65]
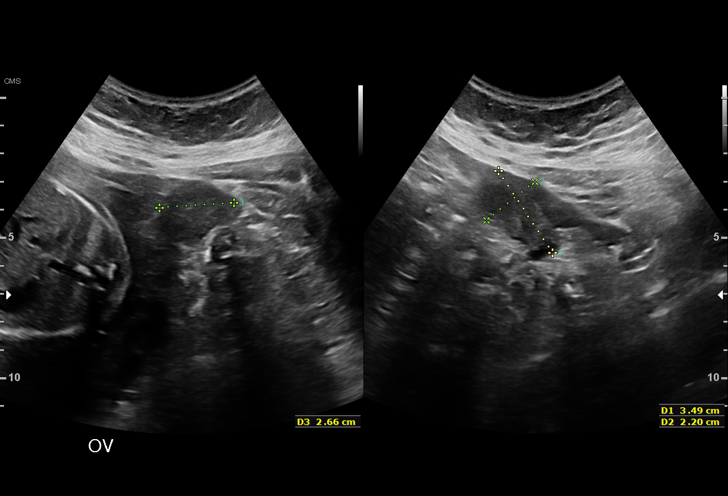
[im 27/65]
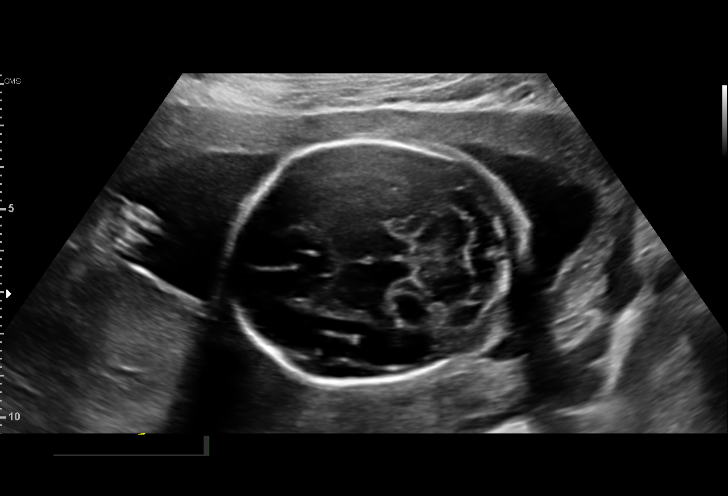
[im 31/65]
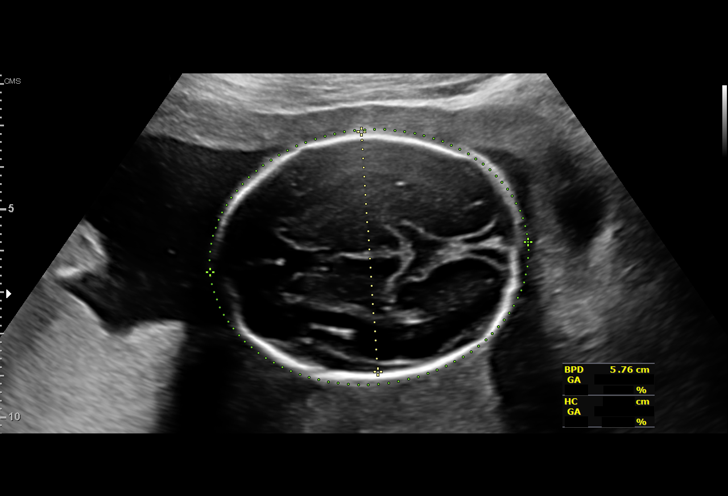
[im 36/65]
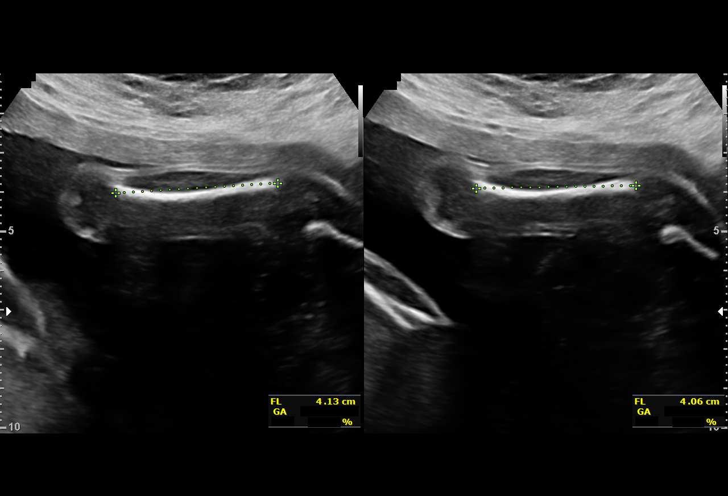
[im 41/65]
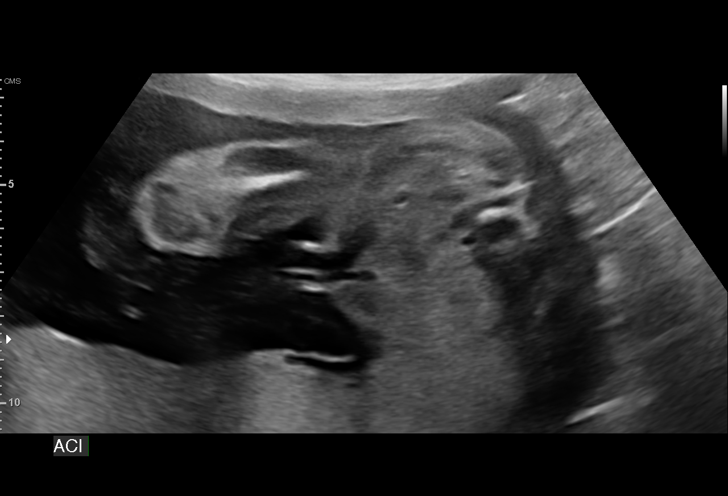
[im 46/65]
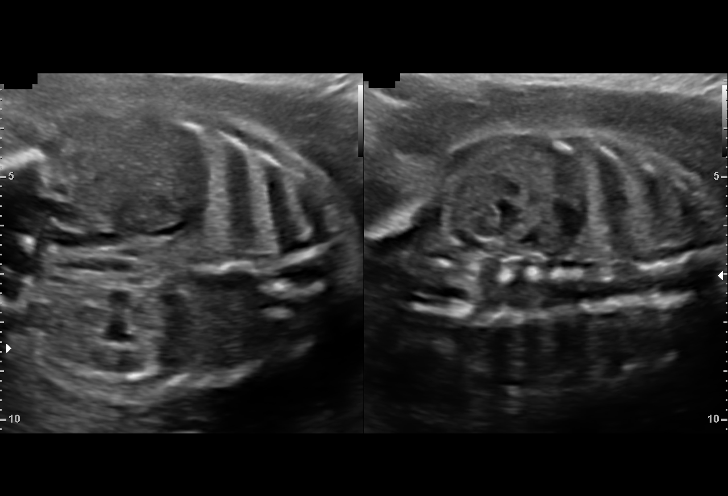
[im 50/65]
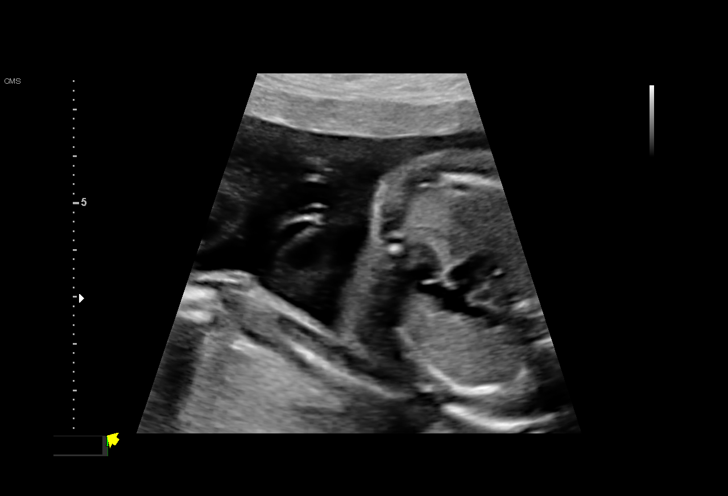
[im 55/65]
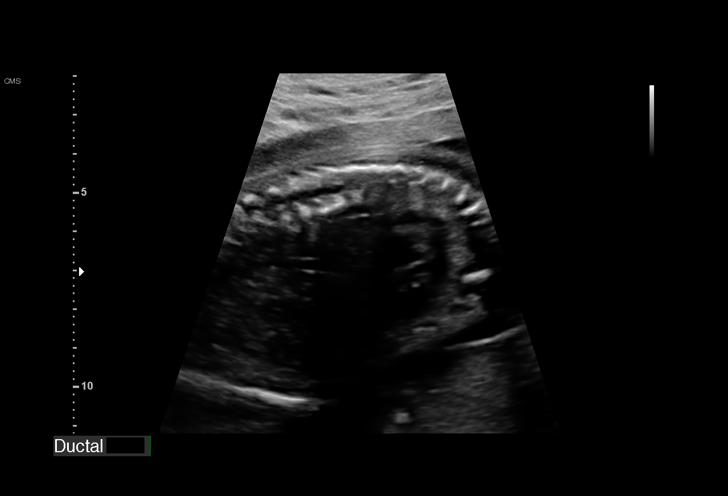
[im 60/65]
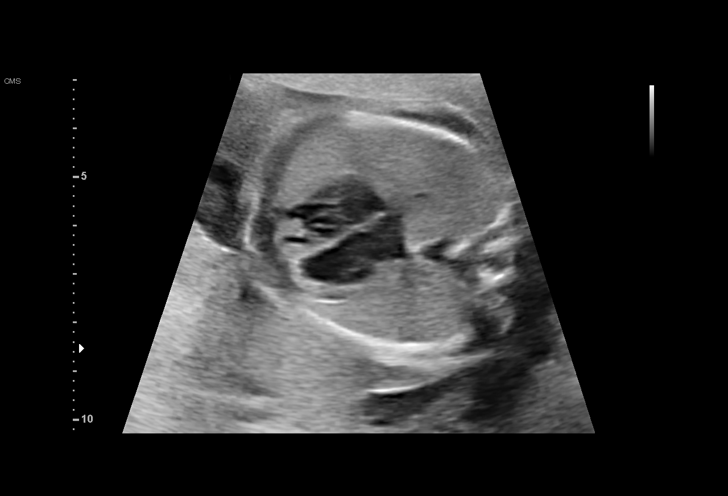
[im 65/65]
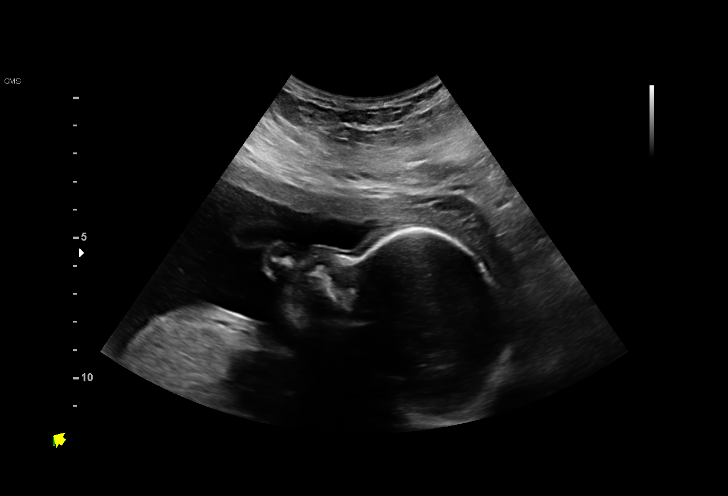

[14 of 28 positions shown; findings below may reference images not displayed]

[REDACTED]

1  LUCIA APARECIDA              [id number]      [id number]     [id number]
Indications

23 weeks gestation of pregnancy
Advanced maternal age multigravida 35+,
second trimester (low risk NIPS)
Encounter for other antenatal screening
follow-up
Obesity complicating pregnancy, second
trimester
Low lying placenta, antepartum
OB History

Gravidity:    5         Term:   2        Prem:   0        SAB:   1
TOP:          0       Ectopic:  1        Living: 2
Fetal Evaluation

Num Of Fetuses:     1
Fetal Heart         155
Rate(bpm):
Cardiac Activity:   Observed
Presentation:       Cephalic
Placenta:           Posterior
P. Cord Insertion:  Visualized

Amniotic Fluid
AFI FV:      Subjectively within normal limits

Largest Pocket(cm)
5.68
Biometry

BPD:      57.8  mm     G. Age:  23w 5d         72  %    CI:        72.28   %    70 - 86
FL/HC:      18.9   %    19.2 -
HC:      216.3  mm     G. Age:  23w 4d         62  %    HC/AC:      1.07        1.05 -
AC:      202.9  mm     G. Age:  24w 6d         91  %    FL/BPD:     70.6   %    71 - 87
FL:       40.8  mm     G. Age:  23w 2d         46  %    FL/AC:      20.1   %    20 - 24
HUM:      41.8  mm     G. Age:  25w 2d         93  %

Est. FW:     661  gm      1 lb 7 oz     70  %
Gestational Age

LMP:           23w 0d        Date:  06/15/17                 EDD:   03/22/18
U/S Today:     23w 6d                                        EDD:   03/16/18
Best:          23w 0d     Det. By:  LMP  (06/15/17)          EDD:   03/22/18
Anatomy

Cranium:               Appears normal         Aortic Arch:            Not well visualized
Cavum:                 Appears normal         Ductal Arch:            Not well visualized
Ventricles:            Appears normal         Diaphragm:              Appears normal
Choroid Plexus:        Appears normal         Stomach:                Appears normal, left
sided
Cerebellum:            Appears normal         Abdomen:                Appears normal
Posterior Fossa:       Appears normal         Abdominal Wall:         Appears nml (cord
insert, abd wall)
Nuchal Fold:           Not applicable (>20    Cord Vessels:           Appears normal (3
wks GA)                                        vessel cord)
Face:                  Appears normal         Kidneys:                Appear normal
(orbits and profile)
Lips:                  Previously seen        Bladder:                Appears normal
Thoracic:              Appears normal         Spine:                  Previously seen
Heart:                 Appears normal         Upper Extremities:      Previously seen
(4CH, axis, and
situs)
RVOT:                  Appears normal         Lower Extremities:      Previously seen
LVOT:                  Appears normal

Other:  Fetus appears to be a male. 5th digit visualized. Heels previously
visualized. Technically difficult due to maternal habitus and fetal
position.
Cervix Uterus Adnexa

Cervix
Length:            3.8  cm.
Normal appearance by transabdominal scan.

Uterus
No abnormality visualized.

Left Ovary
Within normal limits.

Right Ovary
Within normal limits.

Cul De Sac:   No free fluid seen.

Adnexa:       No abnormality visualized.
Impression

Patient returned for completion of fetal anatomy. Amniotic
fluid is normal and good fetal activity is seen. Fetal growth is
appropriate for gestational age.

Cardiac anatomy and profile are normal.

Placenta appears normal and not low lying.

We reassured the patient of the findings.
Recommendations

Follow-up scans as clinically indicated.

## 2020-04-27 ENCOUNTER — Ambulatory Visit: Payer: Self-pay | Admitting: Physical Therapy

## 2020-05-08 ENCOUNTER — Other Ambulatory Visit: Payer: Self-pay

## 2020-05-08 ENCOUNTER — Ambulatory Visit: Payer: Self-pay | Attending: Physician Assistant

## 2020-05-08 DIAGNOSIS — R293 Abnormal posture: Secondary | ICD-10-CM | POA: Insufficient documentation

## 2020-05-08 DIAGNOSIS — M79642 Pain in left hand: Secondary | ICD-10-CM | POA: Insufficient documentation

## 2020-05-08 DIAGNOSIS — M79641 Pain in right hand: Secondary | ICD-10-CM | POA: Insufficient documentation

## 2020-05-08 DIAGNOSIS — M62838 Other muscle spasm: Secondary | ICD-10-CM | POA: Insufficient documentation

## 2020-05-08 DIAGNOSIS — M5412 Radiculopathy, cervical region: Secondary | ICD-10-CM | POA: Insufficient documentation

## 2020-05-09 NOTE — Therapy (Signed)
[facility] [address] [place], [place], [address] Phone: [phone]   Fax:  [phone]  Physical Therapy Treatment/Re-evaluation  Patient Details  Name: Kari Ortega MRN: [id number] Date of Birth: Sep 28, 1980 Referring Provider (PT): Cristie Hem, [place]   Encounter Date: 05/08/2020   PT End of Session - 05/09/20 0801    Visit Number 11    Number of Visits 13    Date for PT Re-Evaluation 04/14/20    Authorization Type Self pay    PT Start Time 1837    PT Stop Time 1920    PT Time Calculation (min) 43 min    Activity Tolerance Patient tolerated treatment well    Behavior During Therapy [facility] for tasks assessed/performed           Past Medical History:  Diagnosis Date   Anemia    Facial paralysis    c/o facial paralysis with last deliveries.  Unable to determine reason per pt.     Irregular periods     Past Surgical History:  Procedure Laterality Date   CHOLECYSTECTOMY N/A 06/14/2018   Procedure: LAPAROSCOPIC CHOLECYSTECTOMY;  Surgeon: [name], De Blanch, MD;  Location: [facility] OR;  Service: General;  Laterality: N/A;   TOOTH EXTRACTION      There were no vitals filed for this visit.   Subjective Assessment - 05/09/20 0757    Subjective Patient reports that she started to feel better after last several PT sessions but has been having increased symptoms in R shoulder and hand recently with weakness and numbness in first 4 digits that is especially apparent when she is washing her hair or cooking.    Patient is accompained by: Interpreter   Donald Pore [id number]   Limitations Lifting;House hold activities    Patient Stated Goals Decreased pain and for numbness of my hand to feel better    Currently in Pain? Yes    Pain Score 5     Pain Location Neck    Pain Orientation Right    Pain Descriptors / Indicators Aching    Pain Type Chronic pain    Pain Onset More than a month ago    Pain Onset More than a  month ago              [facility] PT Assessment - 05/09/20 0001      Assessment   Medical Diagnosis Cervical spine radiculopathy    Referring Provider (PT) Cristie Hem, PA-C      AROM   Overall AROM Comments Minimal pain in R neck with L ROT, L SB, and EXT    Lumbar Flexion 26    Lumbar Extension 50    Lumbar - Right Side Bend 24    Lumbar - Left Side Bend 48    Lumbar - Right Rotation 65    Lumbar - Left Rotation 65      Strength   Overall Strength Comments BUE MMT WFL                         [facility] Adult PT Treatment/Exercise - 05/09/20 0001      Neck Exercises: Standing   Other Standing Exercises bent over row 10lb kettle bell      Neck Exercises: Seated   Other Seated Exercise 10# biceps curl with cues for scap retraction    Other Seated Exercise 10# kettle bell overhead reach      Neck Exercises: Supine   Neck  Retraction 15 reps      Manual Therapy   Joint Mobilization Gentle cervical joint mobilization to tolerance    Soft tissue mobilization STM and myofascial release along R upper trap and levator scap    Passive ROM Passive R upper trap stretch    Manual Traction Gentle manual cervical distraction      Neck Exercises: Stretches   Upper Trapezius Stretch Right;2 reps;30 seconds    Levator Stretch Right;2 reps;30 seconds                  PT Education - 05/09/20 0759    Education Details Reviewed once again carpel tunnel contributing to patient's symptoms in R hand and to avoid reaching high levels of pain. Discussed a couple of sessions to address current chief complaint then pt performing HEP independently to maintain results.    Person(s) Educated Patient    Methods Explanation    Comprehension Verbalized understanding;Need further instruction            PT Short Term Goals - 03/27/20 1555      PT SHORT TERM GOAL #1   Title Pt will be independent in her HEP.    Status Achieved      PT SHORT TERM GOAL #2   Title Pt will  voice understanding of measure to reduce and manage pain    Status Achieved      PT SHORT TERM GOAL #3   Title Pt will voice understanding of proper posture body mechanics and their impact on her pain    Baseline I try to fix my posture during the day because it hurts when I have bad posture.    Status Achieved             PT Long Term Goals - 04/03/20 1554      PT LONG TERM GOAL #1   Title Pt will be able to self correct posture without cueing  in sitting and standing    Baseline understands proper posture and is able to demonstrate    Status Achieved      PT LONG TERM GOAL #2   Title Pt will report improved pain level with her daily activities of cleaning and cooking with 4/10 pain or less.    Baseline up to about a 6/10    Status On-going      PT LONG TERM GOAL #3   Title Pt will demonstrate proper body mechanics for daily activities    Status On-going      PT LONG TERM GOAL #4   Title Pt wil be ind in an advanced HE{P    Baseline doing exercises daily    Status Achieved                 Plan - 05/09/20 0801    Clinical Impression Statement Patient presents to PT for re-evaluation for the first time since 04/03/2020. She reports having improvement overall but states she recently has had increased R cervical and upper trap pain with symptoms in R hand (digits 1-4) especially when cooking or washing her hair. PT reiterated to patient that she should keep pain < 3/10 when performing activities and take breaks to stretch and perform HEP. PT discussed with patient to continue skilled PT intervention to re-address chief complaint over 2-3 sessions and review interventions for patient to continue performing independently to maintain lower pain levels.    Personal Factors and Comorbidities Fitness;Comorbidity 2    Comorbidities obese, R carpal tunnel  Examination-Activity Limitations Lift;Sit;Reach Overhead    Examination-Participation Restrictions Laundry;Cleaning;Meal  Prep    Stability/Clinical Decision Making Stable/Uncomplicated    Rehab Potential Good    PT Frequency 2x / week    PT Duration 4 weeks    PT Treatment/Interventions Biofeedback;Cryotherapy;Electrical Stimulation;Therapeutic activities;Therapeutic exercise;Neuromuscular re-education;Manual techniques;Patient/family education;Dry needling;Spinal Manipulations;Joint Manipulations;ADLs/Self Care Home Management;Iontophoresis 4mg /ml Dexamethasone;Traction;Taping    PT Next Visit Plan Review HEP, manual techniques, postural control, stretching and strengthening    PT Home Exercise Plan [id number] - upper trap stretch    Consulted and Agree with Plan of Care Patient           Patient will benefit from skilled therapeutic intervention in order to improve the following deficits and impairments:  Decreased activity tolerance,Increased muscle spasms,Obesity,Pain,Decreased strength,Postural dysfunction,Improper body mechanics  Visit Diagnosis: Radiculopathy of cervical spine  Muscle spasms of neck  Abnormal posture  Pain in right hand  Pain in left hand     Problem List Patient Active Problem List   Diagnosis Date Noted   Carpal tunnel syndrome of right wrist 11/10/2019   Symptomatic cholelithiasis 06/13/2018   Postpartum care and examination 04/29/2018   Urine frequency 04/29/2018   Normal labor 03/18/2018   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   BMI 30-39.9 09/21/2017   Obesity in pregnancy 09/21/2017   Language barrier 09/21/2017   Supervision of high risk pregnancy, antepartum 08/24/2017   AMA (advanced maternal age) multigravida 35+ 08/24/2017   HYPERTRIGLYCERIDEMIA 12/15/2006       Haydee Monica, PT, DPT 05/09/20 8:09 AM  [facility] [address] [place], [place], [address] Phone: [phone]   Fax:  [phone]  Name: Kari Ortega MRN: [id number] Date of Birth: May 05, 1981

## 2020-05-31 ENCOUNTER — Ambulatory Visit: Payer: Self-pay

## 2020-06-06 ENCOUNTER — Ambulatory Visit: Payer: Self-pay | Attending: Physician Assistant

## 2020-06-06 ENCOUNTER — Other Ambulatory Visit: Payer: Self-pay

## 2020-06-06 DIAGNOSIS — M79641 Pain in right hand: Secondary | ICD-10-CM | POA: Insufficient documentation

## 2020-06-06 DIAGNOSIS — R293 Abnormal posture: Secondary | ICD-10-CM | POA: Insufficient documentation

## 2020-06-06 DIAGNOSIS — M62838 Other muscle spasm: Secondary | ICD-10-CM | POA: Insufficient documentation

## 2020-06-06 DIAGNOSIS — M5412 Radiculopathy, cervical region: Secondary | ICD-10-CM | POA: Insufficient documentation

## 2020-06-06 NOTE — Progress Notes (Signed)
NEUROLOGY CONSULTATION NOTE  Kari Ortega MRN: [id number] DOB: 01/05/1981  Referring provider: Antony Blackbird, MD Primary care provider: Antony Blackbird, MD  Reason for consult:  Facial paralysis   Subjective:  Kari Ortega. Double is a 39 year old right-handed female who presents for facial paralysis.  History supplemented by rehab and referring provider's notes.   Phone interpreter used, ID [id number].   She developed left sided facial paralysis at age 36.  Over the course of 3 days, she developed difficulty closing her left eye and moving the left side of her mouth.  Symptoms lasted about 4 months.  Overall, symptoms improved but she always had residual left sided facial paralysis.  While in [place], she did receive injections in her face, possibly acupuncture or dry needling, which was helpful.  She reports exacerbation of symptoms (sensation of facial swelling, pain and weakness) following the birth of her 3 children.  She currently experiences pain in the left side of her face.  She is undergoing physical therapy for cervical radiculopathy - right sided neck pain with numbness in the right hand (4th and 5th digits) and her therapist told her there may be therapy that they can offer.      PAST MEDICAL HISTORY: Past Medical History:  Diagnosis Date  . Anemia   . 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  Unable to determine reason per pt.    . Irregular periods     PAST SURGICAL HISTORY: Past Surgical History:  Procedure Laterality Date  . CHOLECYSTECTOMY N/A 06/14/2018   Procedure: LAPAROSCOPIC CHOLECYSTECTOMY;  Surgeon: [name], [name], MD;  Location: [facility];  Service: General;  Laterality: N/A;  . TOOTH EXTRACTION      MEDICATIONS: Current Outpatient Medications on File Prior to Visit  Medication Sig Dispense Refill  . acetaminophen (TYLENOL) 500 MG tablet Take 2 tablets (1,000 mg total) by mouth every 6 (six) hours as needed. 30  tablet 0  . Cholecalciferol (VITAMIN D-3) 125 MCG (5000 UT) TABS Take 1 tablet by mouth daily. 90 tablet 3  . Sunscreen SPF50 LOTN Apply 1 application topically in the morning, at noon, and at bedtime. 100 mL 0   Current Facility-Administered Medications on File Prior to Visit  Medication Dose Route Frequency Provider Last Rate Last Admin  . polyethylene glycol powder (GLYCOLAX/MIRALAX) container 255 g  1 Container Oral Once Rasch, Anderson Malta I, NP        ALLERGIES: No Known Allergies  FAMILY HISTORY: Family History  Problem Relation Age of Onset  . Diabetes Mother     SOCIAL HISTORY: Social History   Socioeconomic History  . Marital status: Married    Spouse name: Not on file  . Number of children: Not on file  . Years of education: Not on file  . Highest education level: Not on file  Occupational History  . Not on file  Tobacco Use  . Smoking status: Never Smoker  . Smokeless tobacco: Never Used  Vaping Use  . Vaping Use: Never used  Substance and Sexual Activity  . Alcohol use: No  . Drug use: No  . Sexual activity: Yes    Birth control/protection: None  Other Topics Concern  . Not on file  Social History Narrative  . Not on file   Social Determinants of Health   Financial Resource Strain: Not on file  Food Insecurity: Not on file  Transportation Needs: Not on file  Physical Activity: Not on file  Stress:  Not on file  Social Connections: Not on file  Intimate Partner Violence: Not on file    Objective:  Blood pressure 126/84, pulse 77, height 5' (1.524 m), weight 173 lb (78.5 kg), SpO2 98 %, currently breastfeeding. General: No acute distress.  Patient appears well-groomed.   Head:  Normocephalic/atraumatic Eyes:  fundi examined but not visualized Neck: supple, right paraspinal tenderness, full range of motion Back: No paraspinal tenderness Heart: regular rate and rhythm Lungs: Clear to auscultation bilaterally. Vascular: No carotid  bruits. Neurological Exam: Mental status: alert and oriented to person, place, and time, recent and remote memory intact, fund of knowledge intact, attention and concentration intact, speech fluent and not dysarthric, language intact. Cranial nerves: CN I: not tested CN II: pupils equal, round and reactive to light, visual fields intact CN III, IV, VI:  full range of motion, no nystagmus, no ptosis CN V: facial sensation slightly dulled on the left V1-V3 CN VII: left upper and lower facial weakness CN VIII: hearing intact CN IX, X: gag intact, uvula midline CN XI: sternocleidomastoid and trapezius muscles intact CN XII: tongue midline Bulk & Tone: normal, no fasciculations. Motor:  muscle strength 5/5 throughout Sensation:  Pinprick, temperature and vibratory sensation intact. Deep Tendon Reflexes:  2+ throughout,  toes downgoing.   Finger to nose testing:  Without dysmetria.   Heel to shin:  Without dysmetria.   Gait:  Normal station and stride.  Romberg negative.  Assessment/Plan:   1.  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  Check MRI brain/left facial nerve with and without contrast 2.  Refer to physical therapy 3.  Start gabapentin titrating to 300mg  at bedtime 4.  Follow up 4 to 6 months.  Thank you for allowing me to take part in the care of this patient.  Metta Clines, DO  CC:  Antony Blackbird, MD

## 2020-06-07 ENCOUNTER — Other Ambulatory Visit: Payer: Self-pay

## 2020-06-07 ENCOUNTER — Encounter: Payer: Self-pay | Admitting: Neurology

## 2020-06-07 ENCOUNTER — Ambulatory Visit (INDEPENDENT_AMBULATORY_CARE_PROVIDER_SITE_OTHER): Payer: Self-pay | Admitting: Neurology

## 2020-06-07 VITALS — BP 126/84 | HR 77 | Ht 60.0 in | Wt 173.0 lb

## 2020-06-07 DIAGNOSIS — G510 Bell's palsy: Secondary | ICD-10-CM

## 2020-06-07 MED ORDER — GABAPENTIN 100 MG PO CAPS: ORAL_CAPSULE | ORAL | 0 refills | Status: DC

## 2020-06-07 NOTE — Therapy (Signed)
[place], [place], [address] Phone: [phone]   Fax:  [phone]  Physical Therapy Treatment/ Re-Certification  Patient Details  Name: Kari Ortega MRN: [id number] Date of Birth: August 26, 1980 Referring Provider (PT): Aundra Dubin, [place]   Encounter Date: 06/06/2020   PT End of Session - 06/07/20 1025    Visit Number 12    Number of Visits 14    Date for PT Re-Evaluation 06/22/20    Authorization Type Self pay    PT Start Time 1513    PT Stop Time 1552    PT Time Calculation (min) 39 min    Activity Tolerance Patient tolerated treatment well    Behavior During Therapy [facility] for tasks assessed/performed           Past Medical History:  Diagnosis Date  . Anemia   . 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  Unable to determine reason per pt.    . Irregular periods     Past Surgical History:  Procedure Laterality Date  . CHOLECYSTECTOMY N/A 06/14/2018   Procedure: LAPAROSCOPIC CHOLECYSTECTOMY;  Surgeon: [name], [name], MD;  Location: [facility];  Service: General;  Laterality: N/A;  . TOOTH EXTRACTION      There were no vitals filed for this visit.   Subjective Assessment - 06/07/20 1015    Subjective Pt reports she is having R neck pain and numbness and weakness of her 4th and 5th digits, having difficulty holding the zipper of her jacket when zipping/unzipping. Pt requested to be seen for her 2 remaining scheduled PT sessions, since she has not been able to come in for PT in the past month.    Patient Stated Goals Decreased pain and for numbness of my hand to feel better    Currently in Pain? Yes    Pain Score 6     Pain Location Neck    Pain Orientation Right    Pain Descriptors / Indicators Aching    Pain Type Chronic pain    Pain Onset More than a month ago                             [facility] Adult PT Treatment/Exercise -  06/07/20 0001      Neck Exercises: Standing   Other Standing Exercises bent over row 10lb kettle bell      Neck Exercises: Seated   Other Seated Exercise 10# biceps curl with cues for scap retraction    Other Seated Exercise Cervical ext 10x 2 c assist from towel with downward pull      Neck Exercises: Supine   Neck Retraction 10 reps                  PT Education - 06/07/20 1021    Education Details Recommended pt completing towel assisted cervical extensions 6x daily for 10 reps    Person(s) Educated Patient    Methods Demonstration;Explanation;Tactile cues;Verbal cues;Handout    Comprehension Verbalized understanding;Returned demonstration;Verbal cues required;Tactile cues required;Need further instruction            PT Short Term Goals - 03/27/20 1555      PT SHORT TERM GOAL #1   Title Pt will be independent in her HEP.    Status Achieved      PT SHORT TERM GOAL #2   Title Pt will voice understanding of measure to reduce  and manage pain    Status Achieved      PT SHORT TERM GOAL #3   Title Pt will voice understanding of proper posture body mechanics and their impact on her pain    Baseline I try to fix my posture during the day because it hurts when I have bad posture.    Status Achieved             PT Long Term Goals - 06/07/20 1028      PT LONG TERM GOAL #2   Title Pt will report improved pain level with her daily activities of cleaning and cooking with 4/10 pain or less.    Status On-going    Target Date 07/20/20      PT LONG TERM GOAL #3   Title Pt will demonstrate proper body mechanics for daily activities    Status On-going    Target Date 07/20/20                 Plan - 06/07/20 1031    Clinical Impression Statement Pt has not been able to come in for PT appts since last re-cert. With testing R 4th and 5th finger pinch strength, it was found to be normal. With cervical ext assisted by downward towel pull, pt reported improved r  cervical pain from 6/10 to 3/10. Review of pt's course of care has found pt's improvement to be intermittent. Will continue PT 2 more vists through 06/22/20 to ensure proper understanding of a final HEP and understanding of pain management through posture, exs, and use of heat/cold. If pt's response to PT continues to be limited at that time, pt was advised to schedule an appt with her orthopedic physician.    Personal Factors and Comorbidities Fitness;Comorbidity 2    Comorbidities obese, R carpal tunnel    Examination-Activity Limitations Lift;Sit;Reach Overhead    Examination-Participation Restrictions Laundry;Cleaning;Meal Prep    Stability/Clinical Decision Making Stable/Uncomplicated    Clinical Decision Making Low    Rehab Potential Good    PT Frequency 1x / week    PT Duration 2 weeks    PT Treatment/Interventions Biofeedback;Cryotherapy;Electrical Stimulation;Therapeutic activities;Therapeutic exercise;Neuromuscular re-education;Manual techniques;Patient/family education;Dry needling;Spinal Manipulations;Joint Manipulations;ADLs/Self Care Home Management;Iontophoresis 4mg /ml Dexamethasone;Traction;Taping    PT Next Visit Plan Review response to towel assist cervical ext    PT Home Exercise Plan [id number] - upper trap stretch    Consulted and Agree with Plan of Care Patient           Patient will benefit from skilled therapeutic intervention in order to improve the following deficits and impairments:  Decreased activity tolerance,Increased muscle spasms,Obesity,Pain,Decreased strength,Postural dysfunction,Improper body mechanics  Visit Diagnosis: Radiculopathy of cervical spine  Muscle spasms of neck  Abnormal posture  Pain in right hand     Problem List Patient Active Problem List   Diagnosis Date Noted  . Carpal tunnel syndrome of right wrist 11/10/2019  . Symptomatic cholelithiasis 06/13/2018  . Postpartum care and examination 04/29/2018  . Urine frequency 04/29/2018   . Normal labor 03/18/2018  . 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 BMI 30-39.9 09/21/2017  . Obesity in pregnancy 09/21/2017  . Language barrier 09/21/2017  . Supervision of high risk pregnancy, antepartum 08/24/2017  . AMA (advanced maternal age) multigravida 35+ 08/24/2017  . HYPERTRIGLYCERIDEMIA 12/15/2006     Gar Ponto MS, PT 06/07/20 10:48 AM  [facility] [address] [place], [place], [address] Phone: [phone]   Fax:  [phone]  Name:  Charlisse Hoberg MRN: [id number] Date of Birth: 1980-06-27

## 2020-06-07 NOTE — Patient Instructions (Addendum)
1.  We will check MRI of brain and left facial nerve with contrast. We have sent a referral to [facility] for your MRI and they will call you directly to schedule your appointment. They are located at [place]. If you need to contact them directly please call [phone].  2.  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  For facial pain, start gabapentin 100mg .  Take 1 capsule at bedtime for one week, then 2 capsules at bedtime for one week, then 3 capsules at bedtime.  Contact me for refills. 4.  Follow up 4 to 6 months.   1. Revisaremos resonancia magntica de cerebro y nervio facial izquierdo con contraste 2. Remitir a fisioterapia por parlisis facial 3. Para el dolor facial, comience con gabapentina 100 mg. Tome 1 cpsula a la hora de acostarse durante [place], luego 2 cpsulas a la hora de acostarse durante [place], luego 3 cpsulas a la hora de [place]. Contctame para recargas. 4. Seguimiento de 4 a 6 meses.    Parlisis facial en los adultos Bell's Palsy, Adult  La parlisis facial es una incapacidad a corto plazo para mover los msculos de una parte del rostro. La incapacidad para moverse, tambin llamada parlisis, es el resultado de la inflamacin o la compresin del sptimo nervio craneal. Este nervio se desplaza a lo largo del crneo y por debajo de la oreja hasta el lado de la cara. Este nervio es responsable de los movimientos faciales, que incluyen parpadear, cerrar los ojos, sonrer y Systems developer. Cules son las causas? Se desconoce la causa exacta de esta afeccin. Puede ser causada por la infeccin de un virus, como el virus de la varicela (herpes zster), de Epstein-Barr o de las paperas. Qu incrementa el riesgo? Es ms probable que sufra esta afeccin si:  Est embarazada.  Tiene diabetes.  Recientemente ha tenido una infeccin en la nariz, la garganta o las vas respiratorias.  Tiene debilitado el sistema de defensa del  organismo (sistema inmunitario).  Tuvo una lesin facial, como una fractura.  Tiene antecedentes familiares de parlisis facial. Cules son los signos o los sntomas? Los sntomas de esta afeccin incluyen:  Debilidad en un lado del rostro.  Cada del prpado y de la comisura de la boca.  Exceso de lagrimeo en uno de los ojos.  Dificultad para cerrar el prpado.  Hilbert Corrigan.  Sequedad en la boca.  Cambios en el sentido del gusto.  Cambio en el aspecto facial.  Dolor detrs de [place].  Zumbido en una o ambas orejas.  Sensibilidad al ruido en [place].  Contraccin facial.  Dolor de cabeza.  Trastornos del habla.  Mareos.  Dificultad para comer o beber. La mayor parte del [place], solo una parte del rostro se ve afectada. En casos muy poco frecuentes, la parlisis facial puede afectar todo el rostro. Cmo se diagnostica? Esta afeccin se diagnostica en funcin de lo siguiente:  Sus sntomas.  Sus antecedentes mdicos.  Un examen fsico. Es posible que, adems, deba consultar a un especialista en trastornos de los nervios (neurlogo) o enfermedades y afecciones oculares (oftalmlogo). Pueden hacerle estudios, por ejemplo:  Una prueba para controlar si hubo dao nervioso (electromiograma).  Estudios de diagnstico por imgenes, como una exploracin por tomografa computarizada (TC) o Health visitor (RM).  Anlisis de [place]. Cmo se trata? Esta afeccin afecta a cada persona de un modo diferente. A veces, los sntomas desaparecen sin tratamiento en el plazo de  unas semanas. Si el tratamiento es necesario, vara de persona a Nurse, learning disability. El objetivo del tratamiento es reducir la inflamacin y proteger los ojos para que no se daen. El tratamiento de la parlisis facial puede incluir lo siguiente:  Medicamentos, como por ejemplo: ? Corticoesteroides para reducir la hinchazn y la inflamacin. ? Medicamentos antivirales. ? Analgsicos, como  aspirina, paracetamol o ibuprofeno.  Gotas oftlmicas o ungento para [employer].  Proteccin para los ojos, si no puede cerrar el ojo.  Ejercicios o masajes para recuperar la fuerza y la funcin del msculo (fisioterapia). Siga estas instrucciones en su casa:  Use los medicamentos de venta libre y los recetados solamente como se lo haya indicado el mdico.  Si el ojo est afectado: ? Aplique el ungento o las gotas oftlmicas para Theatre manager los ojos humectados tal como se lo haya indicado el mdico. ? Siga las instrucciones del mdico para el cuidado y Health visitor de los ojos como se lo haya indicado el mdico.  Haga los ejercicios de fisioterapia como se lo haya indicado el mdico.  Cumpla con todas las visitas de seguimiento. Esto es importante.   Comunquese con un mdico si:  Tiene fiebre o escalofros.  Los sntomas no mejoran en un lapso de 2 a 3semanas, o empeoran.  El ojo est rojo, irritado o le duele.  Aparecen nuevos sntomas. Solicite ayuda de inmediato si:  Siente debilidad o entumecimiento en alguna parte del cuerpo que no sea el rostro.  Tiene dificultad para tragar.  Tiene dolor o rigidez en el cuello.  Tiene mareos o Risk manager. Resumen  La parlisis facial es una incapacidad a corto plazo para mover los msculos de una parte del rostro. La incapacidad para moverse es el resultado de la inflamacin o la compresin del nervio facial.  Esta afeccin afecta a cada persona de un modo diferente. A veces, los sntomas desaparecen sin tratamiento en el plazo de unas semanas.  Si el tratamiento es necesario, vara de persona a Nurse, learning disability. El objetivo del tratamiento es reducir la inflamacin y proteger los ojos para que no se daen.  Comunquese con el mdico si los sntomas no mejoran en el lapso de 2 a 3semanas, o empeoran. Esta informacin no tiene Marine scientist el consejo del mdico. Asegrese de hacerle al mdico cualquier  pregunta que tenga. Document Revised: 02/20/2020 Document Reviewed: 02/20/2020 Elsevier Patient Education  2021 [employer].

## 2020-06-11 ENCOUNTER — Other Ambulatory Visit: Payer: Self-pay

## 2020-06-11 ENCOUNTER — Ambulatory Visit: Payer: Self-pay | Attending: Internal Medicine | Admitting: Internal Medicine

## 2020-06-11 ENCOUNTER — Encounter: Payer: Self-pay | Admitting: Internal Medicine

## 2020-06-11 VITALS — BP 127/82 | HR 78 | Temp 98.2°F | Resp 16 | Ht 58.5 in | Wt 173.6 lb

## 2020-06-11 DIAGNOSIS — R21 Rash and other nonspecific skin eruption: Secondary | ICD-10-CM

## 2020-06-11 DIAGNOSIS — E669 Obesity, unspecified: Secondary | ICD-10-CM

## 2020-06-11 MED ORDER — TRIAMCINOLONE ACETONIDE 0.1 % EX CREA: 1.0000 "application " | TOPICAL_CREAM | Freq: Every day | CUTANEOUS | 0 refills | Status: DC | PRN

## 2020-06-11 NOTE — Progress Notes (Signed)
Patient ID: Kari Ortega, female    DOB: April 22, 1981  MRN: [id number]  CC: Rash  Subjective: Kari Ortega is a 40 y.o. female who presents for rash Her concerns today include:  Pt with hx obesity, HL, pre DM  Patient had presented today stating she was here for her annual exam.  However she had a full exam done in August of last year including Pap smear with Dr. Margarita Rana.  Patient advised that she is not due for her annual exam as yet.   Concern today is with a rash around her neck which she states has been there for 7 months.  The skin around the posterior neck is dark and she feels that this discoloration is getting worse.     Advised that she was prediabetic based on blood test done back in August.  She is not very active.  Not sure what she should do in terms of her eating habits.  HM: completed [facility] 08/27/19 and 09/19/19   Patient Active Problem List   Diagnosis Date Noted  . Carpal tunnel syndrome of right wrist 11/10/2019  . Symptomatic cholelithiasis 06/13/2018  . Postpartum care and examination 04/29/2018  . Urine frequency 04/29/2018  . Normal labor 03/18/2018  . 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 BMI 30-39.9 09/21/2017  . Obesity in pregnancy 09/21/2017  . Language barrier 09/21/2017  . Supervision of high risk pregnancy, antepartum 08/24/2017  . AMA (advanced maternal age) multigravida 35+ 08/24/2017  . HYPERTRIGLYCERIDEMIA 12/15/2006     Current Outpatient Medications on File Prior to Visit  Medication Sig Dispense Refill  . acetaminophen (TYLENOL) 500 MG tablet Take 2 tablets (1,000 mg total) by mouth every 6 (six) hours as needed. 30 tablet 0  . Cholecalciferol (VITAMIN D-3) 125 MCG (5000 UT) TABS Take 1 tablet by mouth daily. 90 tablet 3  . gabapentin (NEURONTIN) 100 MG capsule Take 1 capsule at bedtime for a week, then 2 capsules at bedtime for a week, then 3 capsules at bedtime 90 capsule 0  . Sunscreen SPF50 LOTN Apply  1 application topically in the morning, at noon, and at bedtime. (Patient not taking: Reported on 06/07/2020) 100 mL 0   Current Facility-Administered Medications on File Prior to Visit  Medication Dose Route Frequency Provider Last Rate Last Admin  . polyethylene glycol powder (GLYCOLAX/MIRALAX) container 255 g  1 Container Oral Once Rasch, Artist Pais, NP        No Known Allergies  Social History   Socioeconomic History  . Marital status: Married    Spouse name: Not on file  . Number of children: Not on file  . Years of education: Not on file  . Highest education level: Not on file  Occupational History  . Not on file  Tobacco Use  . Smoking status: Never Smoker  . Smokeless tobacco: Never Used  Vaping Use  . Vaping Use: Never used  Substance and Sexual Activity  . Alcohol use: No  . Drug use: No  . Sexual activity: Yes    Birth control/protection: None  Other Topics Concern  . Not on file  Social History Narrative   Right handed   Social Determinants of Health   Financial Resource Strain: Not on file  Food Insecurity: Not on file  Transportation Needs: Not on file  Physical Activity: Not on file  Stress: Not on file  Social Connections: Not on file  Intimate Partner Violence: Not on file    Family History  Problem Relation Age of Onset  . Diabetes Mother     Past Surgical History:  Procedure Laterality Date  . CHOLECYSTECTOMY N/A 06/14/2018   Procedure: LAPAROSCOPIC CHOLECYSTECTOMY;  Surgeon: [name], [name], MD;  Location: [facility];  Service: General;  Laterality: N/A;  . TOOTH EXTRACTION      ROS: Review of Systems Negative except as stated above  PHYSICAL EXAM: BP 127/82   Pulse 78   Temp 98.2 F (36.8 C)   Resp 16   Ht 4' 10.5" (1.486 m)   Wt 173 lb 9.6 oz (78.7 kg)   SpO2 98%   BMI 35.66 kg/m   Wt Readings from Last 3 Encounters:  06/11/20 173 lb 9.6 oz (78.7 kg)  06/07/20 173 lb (78.5 kg)  04/02/20 176 lb 12.8 oz (80.2 kg)     Physical Exam  General appearance - alert, well appearing, and in no distress Mental status - normal mood, behavior, speech, dress, motor activity, and thought processes Skin -mild acanthosis nigricans around the posterior neck.  Some dryness of skin in this area.  CMP Latest Ref Rng & Units 12/15/2019 06/14/2018 06/13/2018  Glucose 65 - 99 mg/dL 85 84 -  BUN 6 - 20 mg/dL 12 11 -  Creatinine 0.57 - 1.00 mg/dL 0.59 0.65 0.56  Sodium 134 - 144 mmol/L 142 141 -  Potassium 3.5 - 5.2 mmol/L 4.2 3.5 -  Chloride 96 - 106 mmol/L 107(H) 110 -  CO2 20 - 29 mmol/L 22 23 -  Calcium 8.7 - 10.2 mg/dL 8.6(L) 8.4(L) -  Total Protein 6.5 - 8.1 g/dL - 6.6 -  Total Bilirubin 0.3 - 1.2 mg/dL - 0.9 -  Alkaline Phos 38 - 126 U/L - 162(H) -  AST 15 - 41 U/L - 209(H) -  ALT 0 - 44 U/L - 253(H) -   Lipid Panel     Component Value Date/Time   CHOL 198 12/15/2019 1051   TRIG 159 (H) 12/15/2019 1051   HDL 37 (L) 12/15/2019 1051   CHOLHDL 5.4 (H) 12/15/2019 1051   LDLCALC 132 (H) 12/15/2019 1051    CBC    Component Value Date/Time   WBC 11.4 (H) 12/15/2019 1051   WBC 8.0 06/14/2018 0239   RBC 4.56 12/15/2019 1051   RBC 4.19 06/14/2018 0239   HGB 12.4 12/15/2019 1051   HCT 39.0 12/15/2019 1051   PLT 302 12/15/2019 1051   MCV 86 12/15/2019 1051   MCH 27.2 12/15/2019 1051   MCH 27.0 06/14/2018 0239   MCHC 31.8 12/15/2019 1051   MCHC 32.1 06/14/2018 0239   RDW 14.1 12/15/2019 1051   LYMPHSABS 2.5 12/15/2019 1051   MONOABS 0.5 07/01/2016 0948   EOSABS 0.2 12/15/2019 1051   BASOSABS 0.0 12/15/2019 1051    ASSESSMENT AND PLAN: 1. Rash of neck Informed her that acanthosis nigricans can be associated with insulin resistance and diabetes.  We will check A1c today.  Have given some triamcinolone to use as needed for when she has itching - triamcinolone (KENALOG) 0.1 %; Apply 1 application topically daily as needed.  Dispense: 30 g; Refill: 0  2. Obesity (BMI 35.0-39.9 without comorbidity) Dietary  counseling given.  Advised to eliminate sugary drinks from the diet, cut back on portion sizes of white carbohydrates, eat more white lean meat than red meat and incorporate fresh fruits and vegetables into the diet.  Printed information given.  Encouraged her to get in some form of moderate intensity exercise at least 4 days a  week for 30 minutes. - Hemoglobin A1c   Patient was given the opportunity to ask questions.  Patient verbalized understanding of the plan and was able to repeat key elements of the plan.  AMN interpreter used during this encounter. #[id number]  Orders Placed This Encounter  Procedures  . Hemoglobin A1c     Requested Prescriptions   Signed Prescriptions Disp Refills  . triamcinolone (KENALOG) 0.1 % 30 g 0    Sig: Apply 1 application topically daily as needed.    No follow-ups on file.  Karle Plumber, MD, FACP

## 2020-06-11 NOTE — Patient Instructions (Signed)
Alimentacin saludable Healthy Eating Seguir una modalidad de alimentacin saludable puede ayudarlo a Science writer y Theatre manager un peso saludable, reducir el riesgo de tener enfermedades crnicas y vivir Kari Ortega vida larga y productiva. Es importante que siga una modalidad de alimentacin saludable con un nivel adecuado de caloras para su cuerpo. Debe cubrir sus necesidades nutricionales principalmente a travs de los alimentos, escogiendo una variedad de alimentos ricos en nutrientes. Cules son algunos consejos para seguir este plan? Lea las etiquetas de los alimentos  Lea las etiquetas y elija las que digan lo siguiente: ? Reducido en sodio o con bajo contenido de [place]. ? Jugos con 100% jugo de fruta. ? Alimentos con bajo contenido de grasas saturadas y alto contenido de grasas poliinsaturadas y Veterinary surgeon. ? Alimentos con cereales integrales, como trigo integral, trigo partido, arroz integral y arroz salvaje. ? Cereales integrales fortificados con cido flico. Se recomienda a las mujeres embarazadas o que desean quedar embarazadas.  Lea las etiquetas y evite: ? Los alimentos con una gran cantidad de Nurse, learning disability. Estos incluyen los alimentos que contienen azcar moreno, endulzante a base de maz, jarabe de maz, dextrosa, fructosa, glucosa, jarabe de maz de alta fructosa, miel, azcar invertido, lactosa, jarabe de [place], maltosa, [place], azcar sin refinar, sacarosa, trehalosa y azcar turbinado.  No consuma ms que las siguientes cantidades de azcar agregada por da:  6 cucharaditas (25 g) las mujeres.  9 cucharaditas (38 g) los hombres. ? Los alimentos que contienen almidones y cereales refinados o procesados. ? Los productos de cereales refinados, como harina blanca, harina de maz desgerminada, pan blanco y arroz blanco. Al ir de compras  Elija refrigerios ricos en nutrientes, como verduras, frutas enteras y frutos secos. Evite los refrigerios con alto contenido de caloras y  Location manager, como las papas fritas, los refrigerios frutales y los caramelos.  Use alios y productos para untar a base de aceite con los Building surveyor de grasas slidas como la [place], la margarina en barra o el queso crema.  Limite las salsas, las mezclas y los productos "instantneos" preelaborados como el arroz saborizado, los fideos instantneos y las pastas listas para comer.  Pruebe ms fuentes de protena vegetal, como tofu, tempeh, frijoles negros, edamame, lentejas, frutos secos y semillas.  Explore planes de alimentacin como la dieta mediterrnea o la dieta vegetariana. Al cocinar  Use aceite para Lobbyist de grasas slidas como [place], margarina en barra o [place] de cerdo.  En lugar de frer, trate de cocinar en el horno, en la plancha o en la parrilla, o hervir los alimentos.  Retire la parte grasa de las carnes antes de cocinarlas.  Cocine las verduras al vapor en agua o caldo. Planificacin de las comidas  En las comidas, imagine dividir su plato en cuartos: ? La mitad del plato tiene frutas y verduras. ? Un cuarto del plato tiene cereales integrales. ? Un cuarto del plato tiene protena, especialmente carnes [place], aves, huevos, tofu, frijoles o frutos secos.  Incluya lcteos descremados en su dieta diaria.   Estilo de vida  Elija opciones saludables en todos los mbitos, como en el hogar, el [place], la [place], los restaurantes y [place].  Prepare los alimentos de un modo seguro: ? Lvese las manos despus de manipular carnes crudas. ? [place] de preparacin de los alimentos limpias lavndolas regularmente con agua caliente y [place]. ? Mantenga las carnes crudas separadas de los alimentos que estn listos para comer como las frutas y las verduras. ?  Cocine los frutos de mar, carnes, aves y huevos hasta alcanzar la temperatura interna recomendada. ? Almacene los alimentos a temperaturas seguras. En  general:  Mantenga los alimentos fros a una temperatura de 40F (4,4C) o inferior.  Mantenga los alimentos calientes a una temperatura de 140F (60C) o superior.  Mantenga el congelador a una temperatura de 0 F (-17,8C) o inferior.  Los alimentos dejan de ser seguros para su consumo cuando han estado a una temperatura de entre 40 y 140F (4,4 y 60C) por ms de 2horas. Qu alimentos debo consumir? Frutas Propngase comer el equivalente a 2tazas de frutas frescas, enlatadas (en su jugo natural) o congeladas cada da. Algunos ejemplos de equivalentes a 1taza de frutas son 1manzana pequea, 8fresas grandes, 1taza de fruta enlatada, taza de fruta desecada o 1 taza de jugo 100%. Verduras Propngase comer el equivalente a 2 o 3tazas de verduras frescas y congeladas cada da, incluyendo diferentes variedades y colores. Algunos ejemplos de equivalentes a 1taza de verduras son 2zanahorias medianas, 2 tazas de verduras de hoja verde crudas, 1taza de verduras cortadas (crudas o cocidas) o 1papa mediana al horno. Granos Propngase comer el equivalente a 6onzas de cereales integrales por da. Algunos ejemplos de equivalentes a 1onza de cereales son 1rebanada de pan, 1taza de cereal listo para comer, 3tazas de palomitas de maz o  taza de arroz, pasta o cereales cocidos. Carnes y otras protenas Propngase comer el equivalente a 5 o 6onzas de protena por da. Algunos ejemplos de equivalentes a 1 onza de protena son 1huevo, taza de frutos secos o semillas o 1 cucharada (16g) de mantequilla de man. Un corte de carne o pescado del tamao de un mazo de cartas equivale aproximadamente a 3 a 4 onzas.  De las protenas que consume cada semana, intente que al menos 8onzas provengan de frutos de mar. Esto incluye el salmn, la trucha, el arenque y las anchoas. Lcteos Propngase comer el equivalente a 3tazas de lcteos descremados o con bajo contenido de grasa cada da.  Algunos ejemplos de equivalentes a 1taza de lcteos son 1taza (240ml) de leche, 8onzas (250g) de yogur, 1onzas (44g) de queso natural o 1 taza (240ml) de leche de soja fortificada. Grasas y aceites  Propngase consumir alrededor de 5 cucharaditas (21g) por da. Elija grasas monoinsaturadas, como el aceite de canola y de oliva, aguacate, mantequilla de man y la mayora de los frutos secos, o bien grasas poliinsaturadas, como el aceite de girasol, maz y soja, nueces, piones, semillas de ssamo, semillas de girasol y semillas de lino. Bebidas  Propngase beber seis vasos de 8 onzas de agua por da. Limite el caf a entre tres y cinco tazas de 8 onzas por da.  Limite el consumo de bebidas con cafena que tengan caloras agregadas, como los refrescos y las bebidas energizantes.  Limite el consumo de alcohol a no ms de 1medida por da si es mujer y no est embarazada, y a 2medidas por da si es hombre. Una medida equivale a 12onzas de cerveza (355ml), 5onzas de vino (148ml) o 1onzas de bebidas alcohlicas de alta graduacin (44ml). Condimentos y otros alimentos  Eviteagregar cantidades excesivas de sal a los alimentos. Pruebe darles sabor con hierbas y especias en lugar de sal.  Evite agregar azcar a los alimentos.  Pruebe usar alios, salsas y productos untables a base de aceite en lugar de grasas slidas. Esta informacin se basa en las pautas generales de nutricin de los [place]. Para obtener ms informacin, visite choosemyplate.gov. Las   cantidades exactas pueden variar en funcin de sus necesidades nutricionales. Resumen  Un plan de alimentacin saludable puede ayudarlo a mantener un peso saludable, reducir el riesgo de tener enfermedades crnicas y [place] activo durante toda su vida.  Planifique sus comidas. Asegrese de consumir las porciones correctas de una variedad de alimentos ricos en nutrientes.  En lugar de frer, trate de cocinar en el horno, en la  plancha o en la parrilla, o hervir los alimentos.  Elija opciones saludables en todos los mbitos, como en el hogar, el trabajo, la [place], los restaurantes y [place]. Esta informacin no tiene Marine scientist el consejo del mdico. Asegrese de hacerle al mdico cualquier pregunta que tenga. Document Revised: 09/21/2017 Document Reviewed: 09/21/2017 Elsevier Patient Education  [place].

## 2020-06-12 ENCOUNTER — Encounter: Payer: Self-pay | Admitting: Occupational Therapy

## 2020-06-12 LAB — HEMOGLOBIN A1C
Est. average glucose Bld gHb Est-mCnc: 108 mg/dL
Hgb A1c MFr Bld: 5.4 % (ref 4.8–5.6)

## 2020-06-12 NOTE — Therapy (Unsigned)
[facility] [address] [place], [place], [address] Phone: [phone]   Fax:  [phone]  June 12, 2020    No Recipients  Occupational Therapy Discharge Summary   Patient: Nalanie Winiecki MRN: [id number] Date of Birth: [birth year]  Diagnosis: No diagnosis found.  Referring Provider (OT): Dwana Melena   Patient has not returned to OT since 02/08/20.  Patient had significant neck and upper back pain, and care was transferred to PT [facility] at [facility]     Sincerely,  [place], Algernon Huxley, OT  CC No Recipients  [facility] [address] [place], [place], [address] Phone: [phone]   Fax:  [phone]  Patient: Milly Goggins MRN: [id number] Date of Birth: Aug 20, 1980

## 2020-06-13 ENCOUNTER — Telehealth: Payer: Self-pay

## 2020-06-13 ENCOUNTER — Ambulatory Visit: Payer: Self-pay | Attending: Physician Assistant | Admitting: Physical Therapy

## 2020-06-13 ENCOUNTER — Other Ambulatory Visit: Payer: Self-pay

## 2020-06-13 ENCOUNTER — Ambulatory Visit: Payer: Self-pay

## 2020-06-13 DIAGNOSIS — M79641 Pain in right hand: Secondary | ICD-10-CM | POA: Insufficient documentation

## 2020-06-13 DIAGNOSIS — R293 Abnormal posture: Secondary | ICD-10-CM | POA: Insufficient documentation

## 2020-06-13 DIAGNOSIS — M5412 Radiculopathy, cervical region: Secondary | ICD-10-CM | POA: Insufficient documentation

## 2020-06-13 DIAGNOSIS — M62838 Other muscle spasm: Secondary | ICD-10-CM | POA: Insufficient documentation

## 2020-06-13 NOTE — Telephone Encounter (Signed)
error 

## 2020-06-13 NOTE — Telephone Encounter (Signed)
[employer] interpreters isaac  Id# [phone]  contacted pt to go over lab results pt is aware and doesn't have any questions or concerns

## 2020-06-13 NOTE — Therapy (Signed)
[place], [place], [address] Phone: [phone]   Fax:  [phone]  Physical Therapy Treatment  Patient Details  Name: Kari Ortega MRN: [id number] Date of Birth: [birth year] Referring Provider (PT): Aundra Dubin, [place]   Encounter Date: 06/13/2020   PT End of Session - 06/13/20 1556    Visit Number 13    Number of Visits 14    Date for PT Re-Evaluation 06/22/20    Authorization Type Self pay    PT Start Time 1553    PT Stop Time 1640    PT Time Calculation (min) 47 min    Activity Tolerance Patient tolerated treatment well    Behavior During Therapy [facility] for tasks assessed/performed           Past Medical History:  Diagnosis Date  . Anemia   . 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  Unable to determine reason per pt.    . Irregular periods     Past Surgical History:  Procedure Laterality Date  . CHOLECYSTECTOMY N/A 06/14/2018   Procedure: LAPAROSCOPIC CHOLECYSTECTOMY;  Surgeon: [name], [name], MD;  Location: [place];  Service: General;  Laterality: N/A;  . TOOTH EXTRACTION      There were no vitals filed for this visit.   Subjective Assessment - 06/13/20 1557    Subjective Pt reports persistant pain in her arm and nape of her neck.  she is doing exercises 3x/day.  Interpreter present- [place].    Currently in Pain? Yes    Pain Score 6     Pain Location Neck    Pain Orientation Right;Left   upper trap   Aggravating Factors  cleaning, doing dishes (increases pain in arm)    Pain Relieving Factors take a break, doing exercises.              [facility] PT Assessment - 06/13/20 0001      Assessment   Medical Diagnosis Cervical spine radiculopathy    Referring Provider (PT) Nathaniel Man           [facility] Adult PT Treatment/Exercise - 06/13/20 0001      Neck Exercises: Seated   Other Seated Exercise Cervical ext 10x assist from  pillowcase with downward pull for neck mobilization      Neck Exercises: Supine   Other Supine Exercise thoracic ext over yoga mat at bra level x 60 sec with hands supporting head.      Shoulder Exercises: Supine   Horizontal ABduction Both;10 reps    Theraband Level (Shoulder Horizontal ABduction) Level 2 (Red)    External Rotation Both;10 reps    Theraband Level (Shoulder External Rotation) Level 2 (Red)    Flexion Both;10 reps   overhead pull, "touchdown"   Theraband Level (Shoulder Flexion) Level 2 (Red)    Diagonals Both;10 reps    Theraband Level (Shoulder Diagonals) Level 2 (Red)      Shoulder Exercises: Seated   Row Both;10 reps;Strengthening   3 sec held in retraction; cues to not elevate scapula   Theraband Level (Shoulder Row) Level 2 (Red)    Other Seated Exercises Rt wrist flexor stretch x 15 sec x 2 reps      Neck Exercises: Stretches   Upper Trapezius Stretch Right;2 reps;30 seconds    Corner Stretch 2 reps;20 seconds   midlevel doorway stretch; cues for upright head posture  PT Education - 06/13/20 1657    Education Details HEP - issued red band with cardboard handles.    Person(s) Educated Patient    Methods Explanation;Demonstration;Verbal cues;Tactile cues;Handout    Comprehension Verbalized understanding;Returned demonstration            PT Short Term Goals - 03/27/20 1555      PT SHORT TERM GOAL #1   Title Pt will be independent in her HEP.    Status Achieved      PT SHORT TERM GOAL #2   Title Pt will voice understanding of measure to reduce and manage pain    Status Achieved      PT SHORT TERM GOAL #3   Title Pt will voice understanding of proper posture body mechanics and their impact on her pain    Baseline I try to fix my posture during the day because it hurts when I have bad posture.    Status Achieved             PT Long Term Goals - 06/13/20 1604      PT LONG TERM GOAL #1   Title Pt will be able to self  correct posture without cueing  in sitting and standing    Baseline understands proper posture and is able to demonstrate    Status Achieved      PT LONG TERM GOAL #2   Title Pt will report improved pain level with her daily activities of cleaning and cooking with 4/10 pain or less.    Baseline up to about a 6/10    Status On-going      PT LONG TERM GOAL #3   Title Pt will demonstrate proper body mechanics for daily activities    Status On-going      PT LONG TERM GOAL #4   Title Pt wil be ind in an advanced HE{P    Baseline doing exercises daily    Status Achieved                 Plan - 06/13/20 1646    Clinical Impression Statement Trialed resisted exercises in decompression position with good tolerance; reported decrease of pain in neck/upper trap from 6/10 to 2/10 at end of session.  Pt voiced interest in physical therapy for her face (new referral?); suggested pt finalize therapy for neck during next visit.  Encouraged pt to bring head to neutral position frequently when cleaning to avoid increasing neck pain.  This will assist in pt meeting LTG#2.  Pt has partially met her goals.    Personal Factors and Comorbidities Fitness;Comorbidity 2    Comorbidities obese, R carpal tunnel    Examination-Activity Limitations Lift;Sit;Reach Overhead    Examination-Participation Restrictions Laundry;Cleaning;Meal Prep    Stability/Clinical Decision Making Stable/Uncomplicated    Rehab Potential Good    PT Frequency 1x / week    PT Duration 2 weeks    PT Treatment/Interventions Biofeedback;Cryotherapy;Electrical Stimulation;Therapeutic activities;Therapeutic exercise;Neuromuscular re-education;Manual techniques;Patient/family education;Dry needling;Spinal Manipulations;Joint Manipulations;ADLs/Self Care Home Management;Iontophoresis 4mg /ml Dexamethasone;Traction;Taping    PT Next Visit Plan assess goals and readiness to d/c to HEP.    PT Home Exercise Plan [id number] - upper trap stretch;  VHI supine scap stabilization exercises. (issued cardboard handles for bands)    Consulted and Agree with Plan of Care Patient           Patient will benefit from skilled therapeutic intervention in order to improve the following deficits and impairments:  Decreased activity tolerance,Increased muscle spasms,Obesity,Pain,Decreased strength,Postural dysfunction,Improper  body mechanics  Visit Diagnosis: Radiculopathy of cervical spine  Muscle spasms of neck  Abnormal posture     Problem List Patient Active Problem List   Diagnosis Date Noted  . Carpal tunnel syndrome of right wrist 11/10/2019  . Symptomatic cholelithiasis 06/13/2018  . Postpartum care and examination 04/29/2018  . Urine frequency 04/29/2018  . Normal labor 03/18/2018  . 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 BMI 30-39.9 09/21/2017  . Obesity in pregnancy 09/21/2017  . Language barrier 09/21/2017  . Supervision of high risk pregnancy, antepartum 08/24/2017  . AMA (advanced maternal age) multigravida 35+ 08/24/2017  . HYPERTRIGLYCERIDEMIA 12/15/2006   Kerin Perna, PTA 06/13/20 4:58 PM  [facility] [address] [place], [place], [address] Phone: [phone]   Fax:  [phone]  Name: Kari Ortega MRN: [id number] Date of Birth: 1980/06/11

## 2020-06-13 NOTE — Patient Instructions (Signed)
Over Head Pull: Narrow Grip     [place] [phone]   On back, knees bent, feet flat, band across thighs, elbows straight but relaxed. Pull hands apart (start). Keeping elbows straight, bring arms up and over head, hands toward floor. Keep pull steady on band. Hold momentarily. Return slowly, keeping pull steady, back to start. Repeat _10 times. Band color _rojo_____   Side Pull: Double Arm   On back, knees bent, feet flat. Arms perpendicular to body, shoulder level, elbows straight but relaxed. Pull arms out to sides, elbows straight. Resistance band comes across collarbones, hands toward floor. Hold momentarily. Slowly return to starting position. Repeat _10__ times. Band color _rojo____   Sash   On back, knees bent, feet flat, left hand on left hip, right hand above left. Pull right arm DIAGONALLY (hip to shoulder) across chest. Bring right arm along head toward floor. Hold momentarily. Slowly return to starting position. Repeat _10_ times. Do with left arm. Band color _rojo_____   Shoulder Rotation: Double Arm   On back, knees bent, feet flat, elbows tucked at sides, bent 90, hands palms up. Pull hands apart and down toward floor, keeping elbows near sides. Hold momentarily. Slowly return to starting position. Repeat _10__ times. Band color __rojo____

## 2020-06-19 ENCOUNTER — Ambulatory Visit: Payer: Self-pay

## 2020-06-19 ENCOUNTER — Other Ambulatory Visit: Payer: Self-pay

## 2020-06-19 DIAGNOSIS — M5412 Radiculopathy, cervical region: Secondary | ICD-10-CM

## 2020-06-19 DIAGNOSIS — M79641 Pain in right hand: Secondary | ICD-10-CM

## 2020-06-19 DIAGNOSIS — R293 Abnormal posture: Secondary | ICD-10-CM

## 2020-06-19 DIAGNOSIS — M62838 Other muscle spasm: Secondary | ICD-10-CM

## 2020-06-20 NOTE — Therapy (Signed)
[place], [place], [address] Phone: [phone]   Fax:  [phone]  Physical Therapy Treatment/Discharge Summary  Patient Details  Name: Kari Ortega MRN: [id number] Date of Birth: [birth year] Referring Provider (PT): Aundra Dubin, [place]   Encounter Date: 06/19/2020   PT End of Session - 06/19/20 1753    Visit Number 14    Number of Visits 14    Date for PT Re-Evaluation 06/22/20    Authorization Type Self pay    PT Start Time 1754   pt arrived late   PT Stop Time 1829    PT Time Calculation (min) 35 min    Activity Tolerance Patient tolerated treatment well    Behavior During Therapy [facility] for tasks assessed/performed           Past Medical History:  Diagnosis Date  . Anemia   . 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  Unable to determine reason per pt.    . Irregular periods     Past Surgical History:  Procedure Laterality Date  . CHOLECYSTECTOMY N/A 06/14/2018   Procedure: LAPAROSCOPIC CHOLECYSTECTOMY;  Surgeon: [name], [name], MD;  Location: [place];  Service: General;  Laterality: N/A;  . TOOTH EXTRACTION      There were no vitals filed for this visit.   Subjective Assessment - 06/19/20 1757    Subjective Pt reports 5/10 pain along R side of neck.    Patient is accompained by: Interpreter   Cory Roughen [phone]   Limitations Lifting;House hold activities    Patient Stated Goals Decreased pain and for numbness of my hand to feel better    Currently in Pain? Yes    Pain Score 5     Pain Location Neck    Pain Orientation Right   along upper trap   Pain Descriptors / Indicators Aching    Pain Type Chronic pain    Pain Onset More than a month ago    Pain Onset More than a month ago              [facility] PT Assessment - 06/19/20 0001      Assessment   Medical Diagnosis Cervical spine radiculopathy    Referring Provider (PT) Aundra Dubin,  PA-C      AROM   Lumbar Flexion 20    Lumbar Extension 50    Lumbar - Right Side Bend 30    Lumbar - Left Side Bend 40    Lumbar - Right Rotation 58    Lumbar - Left Rotation 54      Strength   Overall Strength Comments BUE MMT WFL - 4/5 grossly                         [facility] Adult PT Treatment/Exercise - 06/19/20 0001      Self-Care   Self-Care Other Self-Care Comments    Other Self-Care Comments  See patient education      Neck Exercises: Machines for Strengthening   UBE (Upper Arm Bike) L2.5 x 5 min (3 min forward, 2 min backward)      Neck Exercises: Stretches   Upper Trapezius Stretch Right;2 reps;30 seconds    Other Neck Stretches Star gazer stretch 2 x 30 sec                  PT Education - 06/20/20 0019    Education Details  Final HEP with addition of star gazer R SCM stretch, information provided with contact info for 2 offices pt can contact for acupunture treatment    Person(s) Educated Patient    Methods Explanation;Demonstration;Tactile cues;Verbal cues;Handout    Comprehension Verbalized understanding;Returned demonstration;Verbal cues required;Tactile cues required;Need further instruction            PT Short Term Goals - 03/27/20 1555      PT SHORT TERM GOAL #1   Title Pt will be independent in her HEP.    Status Achieved      PT SHORT TERM GOAL #2   Title Pt will voice understanding of measure to reduce and manage pain    Status Achieved      PT SHORT TERM GOAL #3   Title Pt will voice understanding of proper posture body mechanics and their impact on her pain    Baseline I try to fix my posture during the day because it hurts when I have bad posture.    Status Achieved             PT Long Term Goals - 06/20/20 0036      PT LONG TERM GOAL #1   Title Pt will be able to self correct posture without cueing  in sitting and standing    Baseline understands proper posture and is able to demonstrate    Status Achieved       PT LONG TERM GOAL #2   Title Pt will report improved pain level with her daily activities of cleaning and cooking with 4/10 pain or less.    Baseline up to about a 6/10    Status Not Met      PT LONG TERM GOAL #3   Title Pt will demonstrate proper body mechanics for daily activities    Status Partially Met      PT LONG TERM GOAL #4   Title Pt wil be ind in an advanced HE{P    Baseline doing exercises daily    Status Achieved                 Plan - 06/20/20 0031    Clinical Impression Statement Patient reports some temporary relief in R neck pain during exercises but has continued pain intermittently and report of symptoms to R middle digit. 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 Due to patients intermittent pain reduction with no prolonged relief of symptoms, she has been advised to return to physician regarding neck/back pain and RUE symptoms. Information was provided to patient regarding 2 resources where she can seek acupuncture treatment to better address her current chief complaint. She was informed that she may return to PT if needed in the future but encouraged to continue performing HEP.    Personal Factors and Comorbidities Fitness;Comorbidity 2    Comorbidities obese, R carpal tunnel    Examination-Activity Limitations Lift;Sit;Reach Overhead    Examination-Participation Restrictions Laundry;Cleaning;Meal Prep    Stability/Clinical Decision Making Stable/Uncomplicated    Rehab Potential Good    PT Frequency 1x / week    PT Duration 2 weeks    PT Treatment/Interventions Biofeedback;Cryotherapy;Electrical Stimulation;Therapeutic activities;Therapeutic exercise;Neuromuscular re-education;Manual techniques;Patient/family education;Dry needling;Spinal Manipulations;Joint Manipulations;ADLs/Self Care Home Management;Iontophoresis 4mg /ml  Dexamethasone;Traction;Taping    PT Next Visit Plan D/C today    PT Home Exercise Plan [id number] - upper trap stretch; VHI supine scap stabilization exercises. (issued cardboard handles  for bands), star gazer SCM stretch    Recommended Other Services acupuncture clinics ([facility])    Consulted and Agree with Plan of Care Patient           Patient will benefit from skilled therapeutic intervention in order to improve the following deficits and impairments:  Decreased activity tolerance,Increased muscle spasms,Obesity,Pain,Decreased strength,Postural dysfunction,Improper body mechanics  Visit Diagnosis: Radiculopathy of cervical spine  Muscle spasms of neck  Abnormal posture  Pain in right hand     Problem List Patient Active Problem List   Diagnosis Date Noted  . Carpal tunnel syndrome of right wrist 11/10/2019  . Symptomatic cholelithiasis 06/13/2018  . Postpartum care and examination 04/29/2018  . Urine frequency 04/29/2018  . Normal labor 03/18/2018  . 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 BMI 30-39.9 09/21/2017  . Obesity in pregnancy 09/21/2017  . Language barrier 09/21/2017  . Supervision of high risk pregnancy, antepartum 08/24/2017  . AMA (advanced maternal age) multigravida 35+ 08/24/2017  . HYPERTRIGLYCERIDEMIA 12/15/2006   PHYSICAL THERAPY DISCHARGE SUMMARY  Visits from Start of Care: 14  Current functional level related to goals / functional outcomes: See above   Remaining deficits: See above   Education / Equipment: See above  Plan: Patient agrees to discharge.  Patient goals were partially met. Patient is being discharged due to the physician's request.  ?????     Pt without prolonged relief of neck pain with intermittent reduction in symptoms throughout POC. Pt now is interested in receiving treatment for facial nerve palsy for which she was referred to acupuncture for         Haydee Monica, PT,  DPT 06/20/20 12:41 AM  [place], [place], [address] Phone: [phone]   Fax:  [phone]  Name: Kari Ortega MRN: [id number] Date of Birth: Nov 10, 1980

## 2020-07-05 ENCOUNTER — Ambulatory Visit
Admission: RE | Admit: 2020-07-05 | Discharge: 2020-07-05 | Disposition: A | Discharge status: Home / Self Care | Payer: No Typology Code available for payment source | Source: Ambulatory Visit | Attending: Neurology | Admitting: Neurology

## 2020-07-05 ENCOUNTER — Other Ambulatory Visit: Payer: Self-pay

## 2020-07-05 DIAGNOSIS — G510 Bell's palsy: Secondary | ICD-10-CM

## 2020-07-05 MED ORDER — GADOBENATE DIMEGLUMINE 529 MG/ML IV SOLN
15.0000 mL | Freq: Once | INTRAVENOUS | Status: AC | PRN
  Administered 2020-07-05: 15 mL via INTRAVENOUS

## 2020-07-09 ENCOUNTER — Other Ambulatory Visit: Payer: Self-pay

## 2020-07-09 DIAGNOSIS — G510 Bell's palsy: Secondary | ICD-10-CM

## 2020-07-10 ENCOUNTER — Other Ambulatory Visit (INDEPENDENT_AMBULATORY_CARE_PROVIDER_SITE_OTHER): Payer: No Typology Code available for payment source

## 2020-07-10 ENCOUNTER — Other Ambulatory Visit: Payer: Self-pay

## 2020-07-10 DIAGNOSIS — G510 Bell's palsy: Secondary | ICD-10-CM

## 2020-07-10 LAB — SEDIMENTATION RATE: Sed Rate: 48 mm/hr — ABNORMAL HIGH (ref 0–20)

## 2020-07-11 LAB — ANGIOTENSIN CONVERTING ENZYME: Angiotensin-Converting Enzyme: 16 U/L (ref 9–67)

## 2020-07-11 LAB — SJOGREN'S SYNDROME ANTIBODS(SSA + SSB)
SSA (Ro) (ENA) Antibody, IgG: <1 AI
SSB (La) (ENA) Antibody, IgG: <1 AI

## 2020-07-11 LAB — ANA: Anti Nuclear Antibody (ANA): NEGATIVE

## 2020-07-15 LAB — ANA+ENA+DNA/DS+SCL 70+SJOSSA/B
ANA Titer 1: NEGATIVE
ENA RNP Ab: <0.2 AI (ref 0.0–0.9)
ENA SM Ab Ser-aCnc: <0.2 AI (ref 0.0–0.9)
ENA SSA (RO) Ab: <0.2 AI (ref 0.0–0.9)
ENA SSB (LA) Ab: <0.2 AI (ref 0.0–0.9)
Scleroderma (Scl-70) (ENA) Antibody, IgG: 0.2 AI (ref 0.0–0.9)
dsDNA Ab: 1 IU/mL (ref 0–9)

## 2020-07-15 LAB — LYME AB/WESTERN BLOT REFLEX
LYME DISEASE AB, QUANT, IGM: <0.8 index (ref 0.00–0.79)
Lyme IgG/IgM Ab: <0.91 {ISR} (ref 0.00–0.90)

## 2020-07-20 ENCOUNTER — Other Ambulatory Visit: Payer: Self-pay

## 2020-07-20 DIAGNOSIS — G510 Bell's palsy: Secondary | ICD-10-CM

## 2020-07-20 NOTE — Progress Notes (Signed)
New referral added per [facility]

## 2020-09-17 ENCOUNTER — Ambulatory Visit: Payer: No Typology Code available for payment source

## 2020-09-24 ENCOUNTER — Ambulatory Visit: Payer: No Typology Code available for payment source | Attending: Internal Medicine

## 2020-09-24 ENCOUNTER — Other Ambulatory Visit: Payer: Self-pay

## 2020-10-15 ENCOUNTER — Ambulatory Visit: Payer: Self-pay | Attending: Internal Medicine | Admitting: Internal Medicine

## 2020-10-15 ENCOUNTER — Other Ambulatory Visit: Payer: Self-pay

## 2020-10-15 DIAGNOSIS — L309 Dermatitis, unspecified: Secondary | ICD-10-CM

## 2020-10-15 DIAGNOSIS — H04129 Dry eye syndrome of unspecified lacrimal gland: Secondary | ICD-10-CM

## 2020-10-15 MED ORDER — MICONAZOLE NITRATE 2 % EX CREA: TOPICAL_CREAM | CUTANEOUS | 0 refills | Status: DC

## 2020-10-15 MED ORDER — MICONAZOLE NITRATE 2 % EX CREA
TOPICAL_CREAM | CUTANEOUS | 0 refills | Status: DC
  Filled 2020-10-15: qty 28.35, fill #0

## 2020-10-15 NOTE — Progress Notes (Signed)
Virtual Visit via Telephone Note  I connected with Teri Legacy Perez-Mendez on 10/15/2020 at 3:58 a.m by telephone and verified that I am speaking with the correct person using two identifiers  Location: Patient: home Provider: office  Participants: Myself Patient [place] interpreter: [phone], Delia   I discussed the limitations, risks, security and privacy concerns of performing an evaluation and management service by telephone and the availability of in person appointments. I also discussed with the patient that there may be a patient responsible charge related to this service. The patient expressed understanding and agreed to proceed.   History of Present Illness: Pt with hx obesity, HL, pre DM  Pt c/o peeling of skin around border of RT foot x 1 mth Some itching at times Occurred before mainly in summer. Never had it eval.  No known initiating factors.   Reports having some LT lower quadrant abdomin 2 wks ago for a few days.  Menses came shortly after and blood was dark.  Pain resolved.  Also c/o burning sensation both eyes intermittently x 15 days No itching of eyes or redness. No blurred vision Outpatient Encounter Medications as of 10/15/2020  Medication Sig  . acetaminophen (TYLENOL) 500 MG tablet Take 2 tablets (1,000 mg total) by mouth every 6 (six) hours as needed.  . Cholecalciferol (VITAMIN D-3) 125 MCG (5000 UT) TABS Take 1 tablet by mouth daily.  Marland Kitchen gabapentin (NEURONTIN) 100 MG capsule Take 1 capsule at bedtime for a week, then 2 capsules at bedtime for a week, then 3 capsules at bedtime  . Sunscreen SPF50 LOTN Apply 1 application topically in the morning, at noon, and at bedtime. (Patient not taking: Reported on 06/07/2020)  . triamcinolone (KENALOG) 0.1 % Apply 1 application topically daily as needed.   Facility-Administered Encounter Medications as of 10/15/2020  Medication  . polyethylene glycol powder (GLYCOLAX/MIRALAX) container 255 g       Observations/Objective: No direct observation done as this was a telephone visit.  Assessment and Plan: 1. Dermatitis of right foot Possible tinea pedis.  I will prescribe some miconazole for her to use twice a day for 7 days.  Follow-up if no improvement. - miconazole (MICOTIN) 2 % cream; Apply to affected area on foot twice a day  Dispense: 28.35 g; Refill: 0  2. Dry eye Possible dry eyes.  I recommend purchasing some artificial tears over-the-counter and using as needed.   Follow Up Instructions: PRN   I discussed the assessment and treatment plan with the patient. The patient was provided an opportunity to ask questions and all were answered. The patient agreed with the plan and demonstrated an understanding of the instructions.   The patient was advised to call back or seek an in-person evaluation if the symptoms worsen or if the condition fails to improve as anticipated.  I  Spent 15 minutes on this telephone encounter  Karle Plumber, MD

## 2020-10-25 ENCOUNTER — Other Ambulatory Visit: Payer: Self-pay

## 2020-12-04 ENCOUNTER — Emergency Department (HOSPITAL_COMMUNITY)
Admission: EM | Admit: 2020-12-04 | Discharge: 2020-12-05 | Disposition: A | Discharge status: Home / Self Care | Payer: No Typology Code available for payment source | Attending: Emergency Medicine | Admitting: Emergency Medicine

## 2020-12-04 ENCOUNTER — Emergency Department (HOSPITAL_COMMUNITY): Payer: No Typology Code available for payment source

## 2020-12-04 DIAGNOSIS — S99921A Unspecified injury of right foot, initial encounter: Secondary | ICD-10-CM | POA: Diagnosis present

## 2020-12-04 DIAGNOSIS — Y92410 Unspecified street and highway as the place of occurrence of the external cause: Secondary | ICD-10-CM | POA: Insufficient documentation

## 2020-12-04 DIAGNOSIS — S92154A Nondisplaced avulsion fracture (chip fracture) of right talus, initial encounter for closed fracture: Secondary | ICD-10-CM | POA: Diagnosis not present

## 2020-12-04 DIAGNOSIS — T07XXXA Unspecified multiple injuries, initial encounter: Secondary | ICD-10-CM

## 2020-12-04 DIAGNOSIS — S8002XA Contusion of left knee, initial encounter: Secondary | ICD-10-CM | POA: Diagnosis not present

## 2020-12-04 DIAGNOSIS — T148XXA Other injury of unspecified body region, initial encounter: Secondary | ICD-10-CM
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
## 2020-12-05 ENCOUNTER — Emergency Department (HOSPITAL_COMMUNITY): Payer: No Typology Code available for payment source

## 2020-12-05 LAB — I-STAT BETA HCG BLOOD, ED (MC, WL, AP ONLY): I-stat hCG, quantitative: <5 m[IU]/mL (ref <5)

## 2020-12-05 MED ORDER — ACETAMINOPHEN 500 MG PO TABS
1000.0000 mg | ORAL_TABLET | Freq: Once | ORAL | Status: AC
  Administered 2020-12-05: 1000 mg via ORAL
  Filled 2020-12-05: qty 2
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
## 2020-12-05 NOTE — ED Notes (Signed)
Patient refused to wait for the splint , she left.

## 2020-12-11 NOTE — Progress Notes (Signed)
Virtual Visit via Video Note The purpose of this virtual visit is to provide medical care while limiting exposure to the novel coronavirus.    Consent was obtained for video visit:  Yes.   Answered questions that patient had about telehealth interaction:  Yes.   I discussed the limitations, risks, security and privacy concerns of performing an evaluation and management service by telemedicine. I also discussed with the patient that there may be a patient responsible charge related to this service. The patient expressed understanding and agreed to proceed.  Pt location: Home Physician Location: office Name of referring provider:  Ladell Pier, MD I connected with Kari Ortega at patients initiation/request on 12/12/2020 at 12:50 PM EDT by video enabled telemedicine application and verified that I am speaking with the correct person using two identifiers. 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  Workup negative.  Start amtriptyline '10mg'$  at bedtime.  If no improvement in 4 weeks, we can increase to '25mg'$  at bedtime Follow up 6 months.  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  UPDATE: MRI of brain and left facial nerve with and without contrast on 07/05/2020 personally reviewed showed asymmetric enhancement of the genu of the left facial nerve.  Serum labs on 07/10/2020 included negative ANA, negative SSA/SSB antibodies, negative Lyme antibody, ACE 16 and elevated sed rate 48.  Started on gabapentin.  She stopped it because it caused chest discomfort and dizziness.     HISTORY:  She developed left sided facial paralysis at age 35.  Over the course of 3 days, she developed difficulty closing her left eye and moving the  left side of her mouth.  Symptoms lasted about 4 months.  Overall, symptoms improved but she always had residual left sided facial paralysis.  While in [place], she did receive injections in her face, possibly acupuncture or dry needling, which was helpful.  She reports exacerbation of symptoms (sensation of facial swelling, pain and weakness) following the birth of her 3 children.  She currently experiences pain in the left side of her face.  She is undergoing physical therapy for cervical radiculopathy - right sided neck pain with numbness in the right hand (4th and 5th digits) and her therapist told her there may be therapy that they can offer.  Past medication:  gabapentin (side effects)  Past Medical History: Past Medical History:  Diagnosis Date   Anemia    Facial paralysis    c/o facial paralysis with last deliveries.  Unable to determine reason per pt.     Irregular periods     Medications: Outpatient Encounter Medications as of 12/12/2020  Medication Sig   acetaminophen (TYLENOL) 500 MG tablet Take 2 tablets (1,000 mg total) by mouth every 6 (six) hours as needed. (Patient not taking: No sig reported)   Cholecalciferol (VITAMIN D-3) 125 MCG (5000 UT) TABS Take 1 tablet by mouth daily. (Patient not taking: No sig reported)   gabapentin (NEURONTIN) 100 MG capsule Take 1 capsule at bedtime for a week, then 2 capsules at bedtime for a week, then 3 capsules at bedtime (Patient not taking: No sig reported)   miconazole (MICOTIN) 2 % cream Apply to affected area on foot twice a day (Patient not taking: Reported on 12/05/2020)   Sunscreen  SPF50 LOTN Apply 1 application topically in the morning, at noon, and at bedtime. (Patient not taking: No sig reported)   triamcinolone (KENALOG) 0.1 % Apply 1 application topically daily as needed. (Patient not taking: No sig reported)   Facility-Administered Encounter Medications as of 12/12/2020  Medication   polyethylene glycol powder (GLYCOLAX/MIRALAX)  container 255 g    Allergies: No Known Allergies  Family History: Family History  Problem Relation Age of Onset   Diabetes Mother     Observations/Objective:   Weight 170 lb (77.1 kg), currently breastfeeding. No acute distress.  Alert and oriented.  Speech fluent and not dysarthric.  Language intact.     Follow Up Instructions:    -I discussed the assessment and treatment plan with the patient. The patient was provided an opportunity to ask questions and all were answered. The patient agreed with the plan and demonstrated an understanding of the instructions.   The patient was advised to call back or seek an in-person evaluation if the symptoms worsen or if the condition fails to improve as anticipated.  Dudley Major, DO

## 2020-12-12 ENCOUNTER — Encounter: Payer: Self-pay | Admitting: Neurology

## 2020-12-12 ENCOUNTER — Telehealth (INDEPENDENT_AMBULATORY_CARE_PROVIDER_SITE_OTHER): Payer: No Typology Code available for payment source | Admitting: Neurology

## 2020-12-12 ENCOUNTER — Other Ambulatory Visit: Payer: Self-pay

## 2020-12-12 VITALS — Wt 170.0 lb

## 2020-12-12 DIAGNOSIS — G510 Bell's palsy: Secondary | ICD-10-CM

## 2020-12-12 DIAGNOSIS — G518 Other disorders of facial nerve: Secondary | ICD-10-CM

## 2020-12-12 MED ORDER — AMITRIPTYLINE HCL 10 MG PO TABS: 10.0000 mg | ORAL_TABLET | Freq: Every day | ORAL | 5 refills | Status: DC

## 2020-12-14 ENCOUNTER — Ambulatory Visit: Payer: Self-pay | Admitting: Neurology

## 2020-12-27 ENCOUNTER — Other Ambulatory Visit: Payer: Self-pay

## 2020-12-27 ENCOUNTER — Ambulatory Visit: Payer: No Typology Code available for payment source | Attending: Physician Assistant | Admitting: Physician Assistant

## 2020-12-27 VITALS — BP 104/69 | HR 74 | Ht 60.0 in | Wt 176.8 lb

## 2020-12-27 DIAGNOSIS — Z789 Other specified health status: Secondary | ICD-10-CM

## 2020-12-27 DIAGNOSIS — Z09 Encounter for follow-up examination after completed treatment for conditions other than malignant neoplasm: Secondary | ICD-10-CM

## 2020-12-27 DIAGNOSIS — M79671 Pain in right foot: Secondary | ICD-10-CM

## 2020-12-27 DIAGNOSIS — M79604 Pain in right leg: Secondary | ICD-10-CM

## 2020-12-27 DIAGNOSIS — M545 Low back pain, unspecified: Secondary | ICD-10-CM

## 2020-12-27 DIAGNOSIS — M62838 Other muscle spasm: Secondary | ICD-10-CM

## 2020-12-27 MED ORDER — MUPIROCIN 2 % EX OINT
1.0000 "application " | TOPICAL_OINTMENT | Freq: Two times a day (BID) | CUTANEOUS | 0 refills | Status: DC
  Filled 2020-12-27: qty 22, 11d supply, fill #0

## 2020-12-27 MED ORDER — IBUPROFEN 600 MG PO TABS
600.0000 mg | ORAL_TABLET | Freq: Three times a day (TID) | ORAL | 0 refills | Status: DC | PRN
  Filled 2020-12-27: qty 60, 20d supply, fill #0

## 2020-12-27 MED ORDER — METHOCARBAMOL 500 MG PO TABS
1000.0000 mg | ORAL_TABLET | Freq: Three times a day (TID) | ORAL | 0 refills | Status: DC | PRN
  Filled 2020-12-27: qty 90, 15d supply, fill #0
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
## 2021-01-03 ENCOUNTER — Other Ambulatory Visit: Payer: Self-pay

## 2021-01-03 ENCOUNTER — Ambulatory Visit (INDEPENDENT_AMBULATORY_CARE_PROVIDER_SITE_OTHER): Payer: Self-pay | Admitting: Orthopaedic Surgery

## 2021-01-03 ENCOUNTER — Ambulatory Visit (INDEPENDENT_AMBULATORY_CARE_PROVIDER_SITE_OTHER): Payer: Self-pay

## 2021-01-03 DIAGNOSIS — M5442 Lumbago with sciatica, left side: Secondary | ICD-10-CM

## 2021-01-03 DIAGNOSIS — M25571 Pain in right ankle and joints of right foot: Secondary | ICD-10-CM

## 2021-01-03 MED ORDER — PREDNISONE 10 MG (21) PO TBPK: ORAL_TABLET | ORAL | 0 refills | Status: DC

## 2021-01-03 MED ORDER — METHOCARBAMOL 500 MG PO TABS: 500.0000 mg | ORAL_TABLET | Freq: Two times a day (BID) | ORAL | 0 refills | Status: DC | PRN

## 2021-01-03 MED ORDER — MUPIROCIN 2 % EX OINT: 1.0000 "application " | TOPICAL_OINTMENT | Freq: Two times a day (BID) | CUTANEOUS | 0 refills | Status: DC
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
## 2021-01-22 ENCOUNTER — Ambulatory Visit: Payer: Self-pay | Attending: Physician Assistant

## 2021-01-22 ENCOUNTER — Other Ambulatory Visit: Payer: Self-pay

## 2021-01-22 DIAGNOSIS — G8929 Other chronic pain: Secondary | ICD-10-CM | POA: Diagnosis present

## 2021-01-22 DIAGNOSIS — M545 Low back pain, unspecified: Secondary | ICD-10-CM | POA: Diagnosis not present

## 2021-01-22 DIAGNOSIS — M79671 Pain in right foot: Secondary | ICD-10-CM | POA: Diagnosis present

## 2021-01-22 DIAGNOSIS — M5412 Radiculopathy, cervical region: Secondary | ICD-10-CM | POA: Diagnosis present

## 2021-01-22 DIAGNOSIS — M62838 Other muscle spasm: Secondary | ICD-10-CM | POA: Diagnosis present

## 2021-01-22 DIAGNOSIS — M6281 Muscle weakness (generalized): Secondary | ICD-10-CM | POA: Insufficient documentation
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
## 2021-01-24 ENCOUNTER — Other Ambulatory Visit: Payer: Self-pay

## 2021-01-24 ENCOUNTER — Encounter: Payer: Self-pay | Admitting: Physical Therapy

## 2021-01-24 ENCOUNTER — Ambulatory Visit: Payer: Self-pay | Admitting: Physical Therapy

## 2021-01-24 DIAGNOSIS — M545 Low back pain, unspecified: Secondary | ICD-10-CM

## 2021-01-24 DIAGNOSIS — M6281 Muscle weakness (generalized): Secondary | ICD-10-CM

## 2021-01-24 DIAGNOSIS — M79671 Pain in right foot: Secondary | ICD-10-CM

## 2021-01-24 NOTE — Therapy (Signed)
Kari Ortega, [place], [address] Phone: [phone]   Fax:  [phone]  Physical Therapy Treatment  Patient Details  Name: Kari Ortega MRN: [id number] Date of Birth: Sep 20, 1980 Referring Provider (PT): Aundra Dubin, [place]   Encounter Date: 01/24/2021   PT End of Session - 01/24/21 1655     Visit Number 2    Number of Visits 16    Date for PT Re-Evaluation 03/20/21    Authorization Type Med Pay    PT Start Time [id number]    PT Stop Time 0540    PT Time Calculation (min) 45 min    Activity Tolerance Patient tolerated treatment well;No increased pain    Behavior During Therapy WFL for tasks assessed/performed             Past Medical History:  Diagnosis Date   Anemia    Facial paralysis    c/o facial paralysis with last deliveries.  Unable to determine reason per pt.     Irregular periods     Past Surgical History:  Procedure Laterality Date   CHOLECYSTECTOMY N/A 06/14/2018   Procedure: LAPAROSCOPIC CHOLECYSTECTOMY;  Surgeon: [name], [name], MD;  Location: [facility];  Service: General;  Laterality: N/A;   TOOTH EXTRACTION      There were no vitals filed for this visit.   Subjective Assessment - 01/24/21 1701     Subjective Pt reports that she is feeling slightly better today.  She has bee HEP compliant.  7/10 pain in R ankle aggs: walking eases: rest    Patient is accompained by: Interpreter;Family member   video   How long can you sit comfortably? 20-30 minutes    How long can you stand comfortably? 10-15 minutes    How long can you walk comfortably? 10-15 minutes    Patient Stated Goals Pt would like to decrease pain in lower back and R foot for improving comfort and mobility    Pain Onset More than a month ago             [facility] Adult PT Treatment/Exercise:  Therapeutic Exercise: - nu-step L5 13mwhile taking subjective and planning session with patient -Towel  scrunch 45'' x3 ea flexion/inversion/eversion - bridge with 5'' hold 2x10 - Supine clamshell - GTB - 2x10 alternating  Modalities:  Vasopneumatic (Game Ready)    Location:  right ankle Time:  10 minutes Pressure:  medium Temperature:  38 degrees    PT Short Term Goals - 01/22/21 1756       PT SHORT TERM GOAL #1   Title Pt will be independent in her HEP.    Baseline initial HEP given    Time 3    Period Weeks    Status New    Target Date 02/12/21               PT Long Term Goals - 01/22/21 1800       PT LONG TERM GOAL #1   Title Pt will improve FOTO score to no less than 71% as proxy for functional improvement    Baseline 54% function    Time 8    Period Weeks    Status New    Target Date 03/19/21      PT LONG TERM GOAL #2   Title Pt will decrease 5xSTS to no greater than 15 seconds for improved strength and functional mobility    Baseline 28 sec w/ UE support  Time 8    Period Weeks    Status New    Target Date 03/20/21      PT LONG TERM GOAL #3   Title Pt will self report low back and R LE pain no greater than 3/10 at worst for improving comfort and function    Baseline 7/10 at worst    Time 8    Period Weeks    Status New    Target Date 03/20/21      PT LONG TERM GOAL #4   Title Pt wil be ind in an advanced HE{P    Time 8    Period Weeks    Status New    Target Date 03/20/21      PT LONG TERM GOAL #5   Title Pt will increase R anke DF to no less than 12 deg for improved mobility and foot clearance in gait    Baseline 8 deg DF    Time 8    Period Weeks    Status New    Target Date 03/20/21                   Plan - 01/24/21 1736     Clinical Impression Statement Pt reports no increase in baseline pain following therapy  HEP was reviewed, but left unchanged    Overall, Kari Ortega is progressing well with therapy.  Today we concentrated on core strengthening, ankle strengthening, and ankle range of motion.   Pt has transient pain with all exercises.  Pain in the R foot is diffuse.  Pain in hips is lateral and anterior with bridges.  This pain reduces to baseline with rest.  We will progress as tolerated.  Pt will continue to benefit from skilled physical therapy to address remaining deficits and achieve listed goals.  Continue per POC.    Personal Factors and Comorbidities Fitness;Transportation    Examination-Activity Limitations Locomotion Level;Reach Overhead;Transfers;Squat;Stairs;Stand;Lift;Carry    Examination-Participation Restrictions Yard Work;Volunteer;Occupation;Community Activity    Stability/Clinical Decision Making Stable/Uncomplicated    Rehab Potential Good    PT Frequency 2x / week    PT Duration 8 weeks    PT Treatment/Interventions ADLs/Self Care Home Management;Electrical Stimulation;Cryotherapy;Moist Heat;Gait training;Stair training;Functional mobility training;Therapeutic activities;Balance training;Therapeutic exercise;Neuromuscular re-education;Patient/family education;Manual techniques;Vasopneumatic Device;Dry needling;Spinal Manipulations;Joint Manipulations    PT Next Visit Plan review FOTO; assess response to HEP, progress core and LE strengthening as able; progress gait without AD    PT Home Exercise Plan [email or web address]    Consulted and Agree with Plan of Care Patient             Patient will benefit from skilled therapeutic intervention in order to improve the following deficits and impairments:  Abnormal gait, Decreased activity tolerance, Decreased balance, Decreased endurance, Decreased mobility, Decreased range of motion, Decreased strength, Difficulty walking, Pain  Visit Diagnosis: Chronic bilateral low back pain, unspecified whether sciatica present  Pain in right foot  Muscle weakness (generalized)     Problem List Patient Active Problem List   Diagnosis Date Noted   Carpal tunnel syndrome of right wrist 11/10/2019   Symptomatic cholelithiasis  06/13/2018   Postpartum care and examination 04/29/2018   Urine frequency 04/29/2018   Normal labor 03/18/2018   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

## 2021-02-05 ENCOUNTER — Other Ambulatory Visit: Payer: Self-pay

## 2021-02-05 ENCOUNTER — Ambulatory Visit: Payer: Self-pay | Admitting: Physical Therapy

## 2021-02-05 ENCOUNTER — Encounter: Payer: Self-pay | Admitting: Physical Therapy

## 2021-02-05 DIAGNOSIS — M5412 Radiculopathy, cervical region: Secondary | ICD-10-CM

## 2021-02-05 DIAGNOSIS — M545 Low back pain, unspecified: Secondary | ICD-10-CM

## 2021-02-05 DIAGNOSIS — M62838 Other muscle spasm: Secondary | ICD-10-CM

## 2021-02-05 DIAGNOSIS — M6281 Muscle weakness (generalized): Secondary | ICD-10-CM

## 2021-02-05 DIAGNOSIS — M79671 Pain in right foot: Secondary | ICD-10-CM

## 2021-02-05 NOTE — Therapy (Signed)
[place], [place], [address] Phone: [phone]   Fax:  [phone]  Physical Therapy Treatment  Patient Details  Name: Kari Ortega MRN: [id number] Date of Birth: 12-16-1980 Referring Provider (PT): Aundra Dubin, [place]   Encounter Date: 02/05/2021   PT End of Session - 02/05/21 1702     Visit Number 3    Number of Visits 16    Date for PT Re-Evaluation 03/20/21    Authorization Type Med Pay    PT Start Time 0502    PT Stop Time 9163    PT Time Calculation (min) 43 min    Activity Tolerance Patient tolerated treatment well;No increased pain    Behavior During Therapy WFL for tasks assessed/performed             Past Medical History:  Diagnosis Date   Anemia    Facial paralysis    c/o facial paralysis with last deliveries.  Unable to determine reason per pt.     Irregular periods     Past Surgical History:  Procedure Laterality Date   CHOLECYSTECTOMY N/A 06/14/2018   Procedure: LAPAROSCOPIC CHOLECYSTECTOMY;  Surgeon: [name], [name], MD;  Location: [facility];  Service: General;  Laterality: N/A;   TOOTH EXTRACTION      There were no vitals filed for this visit.   Subjective Assessment - 02/05/21 1707     Subjective Pt reports that she is feeling slightly better today.  She has bee HEP compliant.  She reports that she hs some pain on the plantar surface of her foot as well as in the toes today.  6/10 pain in R ankle and foot aggs: walking eases: rest    Patient is accompained by: Interpreter   video   How long can you sit comfortably? 20-30 minutes    How long can you stand comfortably? 10-15 minutes    How long can you walk comfortably? 10-15 minutes    Patient Stated Goals Pt would like to decrease pain in lower back and R foot for improving comfort and mobility    Pain Onset More than a month ago               PT Education - 02/05/21 1716     Education  Details HEP    Person(s) Educated Patient    Methods Handout    Comprehension Verbalized understanding            [facility] Adult PT Treatment/Exercise:   Therapeutic Exercise: - nu-step L5 30m while taking subjective and planning session with patient - Towel scrunch 1'x2 ea flexion - ankle 4 way - YTB - bridge with 5'' hold 2x10 - Supine clamshell - YTB - 2x10 alternating 3''  hold - K-tape stirrup   Manual therapy, concentrating on increasing extensibility of restricted tissue to reduce discomfort and improve mechanics in functional movement:  - STM plantar surface of R foot    PT Short Term Goals - 01/22/21 1756       PT SHORT TERM GOAL #1   Title Pt will be independent in her HEP.    Baseline initial HEP given    Time 3    Period Weeks    Status New    Target Date 02/12/21               PT Long Term Goals - 01/22/21 1800       PT LONG TERM GOAL #1   Title  Pt will improve FOTO score to no less than 71% as proxy for functional improvement    Baseline 54% function    Time 8    Period Weeks    Status New    Target Date 03/19/21      PT LONG TERM GOAL #2   Title Pt will decrease 5xSTS to no greater than 15 seconds for improved strength and functional mobility    Baseline 28 sec w/ UE support    Time 8    Period Weeks    Status New    Target Date 03/20/21      PT LONG TERM GOAL #3   Title Pt will self report low back and R LE pain no greater than 3/10 at worst for improving comfort and function    Baseline 7/10 at worst    Time 8    Period Weeks    Status New    Target Date 03/20/21      PT LONG TERM GOAL #4   Title Pt wil be ind in an advanced HE{P    Time 8    Period Weeks    Status New    Target Date 03/20/21      PT LONG TERM GOAL #5   Title Pt will increase R anke DF to no less than 12 deg for improved mobility and foot clearance in gait    Baseline 8 deg DF    Time 8    Period Weeks    Status New    Target Date 03/20/21                    Plan - 02/05/21 1746     Clinical Impression Statement Pt reports no increase in baseline pain following therapy  HEP was updated and reissued to patient; pt educated on HEP, was provided handout, and verbally confirmed understanding of exercises.    Overall, Iyauna Madahi Perez-Mendez is progressing well with therapy.  Today we concentrated on hip strengthening, ankle strengthening, and pain reduction.  Pt is fearful with all movements and we spent some time during therex talking about the importance of resuming some light activity.  She reponds well to [facility] of plantar surface of foot.  Pt will continue to benefit from skilled physical therapy to address remaining deficits and achieve listed goals.  Continue per POC.    Personal Factors and Comorbidities Fitness;Transportation    Examination-Activity Limitations Locomotion Level;Reach Overhead;Transfers;Squat;Stairs;Stand;Lift;Carry    Examination-Participation Restrictions Yard Work;Volunteer;Occupation;Community Activity    Stability/Clinical Decision Making Stable/Uncomplicated    Rehab Potential Good    PT Frequency 2x / week    PT Duration 8 weeks    PT Treatment/Interventions ADLs/Self Care Home Management;Electrical Stimulation;Cryotherapy;Moist Heat;Gait training;Stair training;Functional mobility training;Therapeutic activities;Balance training;Therapeutic exercise;Neuromuscular re-education;Patient/family education;Manual techniques;Vasopneumatic Device;Dry needling;Spinal Manipulations;Joint Manipulations    PT Next Visit Plan review FOTO; assess response to HEP, progress core and LE strengthening as able; progress gait without AD    PT Home Exercise Plan [email or web address]    Consulted and Agree with Plan of Care Patient             Patient will benefit from skilled therapeutic intervention in order to improve the following deficits and impairments:  Abnormal gait, Decreased activity tolerance, Decreased balance,  Decreased endurance, Decreased mobility, Decreased range of motion, Decreased strength, Difficulty walking, Pain  Visit Diagnosis: Chronic bilateral low back pain, unspecified whether sciatica present  Pain in right foot  Muscle weakness (generalized)  Radiculopathy of  cervical spine  Muscle spasms of neck     Problem List Patient Active Problem List   Diagnosis Date Noted   Carpal tunnel syndrome of right wrist 11/10/2019   Symptomatic cholelithiasis 06/13/2018   Postpartum care and examination 04/29/2018   Urine frequency 04/29/2018   Normal labor 03/18/2018   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 [place], [place], [address] Phone: [phone]   Fax:  [phone]  Name: Louvinia Cumbo MRN: [id number] Date of Birth: 08/16/1980

## 2021-02-05 NOTE — Patient Instructions (Signed)
Access Code: [email or web address] URL: [email or web address] Date: 02/05/2021 Prepared by: Shearon Balo  Exercises Hooklying Clamshell with Resistance - 1 x daily - 7 x weekly - 3 sets - 10 reps Supine Bridge - 1 x daily - 7 x weekly - 2 sets - 10 reps - 3 sec hold Supine Lower Trunk Rotation - 1 x daily - 7 x weekly - 2 sets - 10 reps Supine Piriformis Stretch with Leg Straight - 1 x daily - 7 x weekly - 1 sets - 2-3 reps - 20 sec hold Seated Toe Towel Scrunches - 1 x daily - 7 x weekly - 3 sets - 10 reps Long Sitting Ankle Eversion with Resistance - 1 x daily - 7 x weekly - 3 sets - 10 reps Long Sitting Ankle Plantar Flexion with Resistance - 1 x daily - 7 x weekly - 3 sets - 10 reps Long Sitting Ankle Inversion with Resistance - 1 x daily - 7 x weekly - 3 sets - 10 reps Long Sitting Ankle Dorsiflexion with Anchored Resistance - 1 x daily - 7 x weekly - 3 sets - 10 reps

## 2021-02-07 ENCOUNTER — Other Ambulatory Visit: Payer: Self-pay

## 2021-02-07 ENCOUNTER — Ambulatory Visit: Payer: Self-pay

## 2021-02-07 DIAGNOSIS — M545 Low back pain, unspecified: Secondary | ICD-10-CM

## 2021-02-07 DIAGNOSIS — G8929 Other chronic pain: Secondary | ICD-10-CM

## 2021-02-07 DIAGNOSIS — M79671 Pain in right foot: Secondary | ICD-10-CM

## 2021-02-07 DIAGNOSIS — M6281 Muscle weakness (generalized): Secondary | ICD-10-CM

## 2021-02-07 NOTE — Therapy (Signed)
[place], [place], [address] Phone: [phone]   Fax:  [phone]  Physical Therapy Treatment  Patient Details  Name: Kari Ortega MRN: [id number] Date of Birth: Jun 26, 1980 Referring Provider (PT): Aundra Dubin, [place]   Encounter Date: 02/07/2021   PT End of Session - 02/07/21 1659     Visit Number 4    Number of Visits 16    Date for PT Re-Evaluation 03/20/21    Authorization Type Med Pay    PT Start Time 1700    PT Stop Time 1740    PT Time Calculation (min) 40 min    Activity Tolerance Patient tolerated treatment well;No increased pain    Behavior During Therapy WFL for tasks assessed/performed             Past Medical History:  Diagnosis Date   Anemia    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  Unable to determine reason per pt.     Irregular periods     Past Surgical History:  Procedure Laterality Date   CHOLECYSTECTOMY N/A 06/14/2018   Procedure: LAPAROSCOPIC CHOLECYSTECTOMY;  Surgeon: [name], [name], MD;  Location: [place];  Service: General;  Laterality: N/A;   TOOTH EXTRACTION      There were no vitals filed for this visit.   Subjective Assessment - 02/07/21 1659     Subjective Pt presents to PT with reports of continued pain in back and R foot. States she thinks the exercises as helping, notes improving symptoms overall. Ready to begin PT at this time.    Patient is accompained by: Interpreter   Verlin Fester [phone]   Currently in Pain? Yes    Pain Score 7     Pain Location Back   6/10 R ankle   Pain Orientation Right;Left    Pain Radiating Towards bilat hip           [facility] Adult PT Treatment/Exercise:   Therapeutic Exercise: - nu-step L5 61m while taking subjective and planning session with patient - Towel scrunch 1'x2 ea flexion - ankle 4 way - YTB - bridge with 5'' hold 2x10 - Supine clamshell - RTB - 2x10 alternating 3''   hold -Supine PPT x 10 - 5 sec hold   Manual therapy(NOT TODAY): concentrating on increasing extensibility of restricted tissue to reduce discomfort and improve mechanics in functional movement: - STM plantar surface of R foot                             PT Education - 02/07/21 1737     Education Details HEP update    Person(s) Educated Patient    Methods Handout;Demonstration;Explanation    Comprehension Verbalized understanding;Returned demonstration              PT Short Term Goals - 01/22/21 1756       PT SHORT TERM GOAL #1   Title Pt will be independent in her HEP.    Baseline initial HEP given    Time 3    Period Weeks    Status New    Target Date 02/12/21               PT Long Term Goals - 01/22/21 1800       PT LONG TERM GOAL #1   Title Pt will improve FOTO score to no less than 71% as proxy for functional  improvement    Baseline 54% function    Time 8    Period Weeks    Status New    Target Date 03/19/21      PT LONG TERM GOAL #2   Title Pt will decrease 5xSTS to no greater than 15 seconds for improved strength and functional mobility    Baseline 28 sec w/ UE support    Time 8    Period Weeks    Status New    Target Date 03/20/21      PT LONG TERM GOAL #3   Title Pt will self report low back and R LE pain no greater than 3/10 at worst for improving comfort and function    Baseline 7/10 at worst    Time 8    Period Weeks    Status New    Target Date 03/20/21      PT LONG TERM GOAL #4   Title Pt wil be ind in an advanced HE{P    Time 8    Period Weeks    Status New    Target Date 03/20/21      PT LONG TERM GOAL #5   Title Pt will increase R anke DF to no less than 12 deg for improved mobility and foot clearance in gait    Baseline 8 deg DF    Time 8    Period Weeks    Status New    Target Date 03/20/21                   Plan - 02/07/21 1738     Clinical Impression Statement Pt able to complete  prescribed exercises with no adverse effect or change in baseline pain. Today's session we continued to focuse on increasing core and proximal hip strength, as well as distal R ankle strengthening. Will continue to progress exercises as toelrated per POC.    PT Treatment/Interventions ADLs/Self Care Home Management;Electrical Stimulation;Cryotherapy;Moist Heat;Gait training;Stair training;Functional mobility training;Therapeutic activities;Balance training;Therapeutic exercise;Neuromuscular re-education;Patient/family education;Manual techniques;Vasopneumatic Device;Dry needling;Spinal Manipulations;Joint Manipulations    PT Next Visit Plan assess response to HEP, progress core and LE strengthening as able; progress gait without AD    PT Home Exercise Plan [facility]             Patient will benefit from skilled therapeutic intervention in order to improve the following deficits and impairments:  Abnormal gait, Decreased activity tolerance, Decreased balance, Decreased endurance, Decreased mobility, Decreased range of motion, Decreased strength, Difficulty walking, Pain  Visit Diagnosis: Chronic bilateral low back pain, unspecified whether sciatica present  Pain in right foot  Muscle weakness (generalized)     Problem List Patient Active Problem List   Diagnosis Date Noted   Carpal tunnel syndrome of right wrist 11/10/2019   Symptomatic cholelithiasis 06/13/2018   Postpartum care and examination 04/29/2018   Urine frequency 04/29/2018   Normal labor 03/18/2018   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 [place], [place],  [address] Phone: [phone]   Fax:  [phone]  Name: Kari Ortega MRN: [id number] Date of Birth: [birth year]

## 2021-02-12 ENCOUNTER — Ambulatory Visit: Payer: No Typology Code available for payment source | Attending: Physician Assistant

## 2021-02-12 ENCOUNTER — Other Ambulatory Visit: Payer: Self-pay

## 2021-02-12 DIAGNOSIS — M5412 Radiculopathy, cervical region: Secondary | ICD-10-CM | POA: Diagnosis present

## 2021-02-12 DIAGNOSIS — M62838 Other muscle spasm: Secondary | ICD-10-CM | POA: Diagnosis present

## 2021-02-12 DIAGNOSIS — M545 Low back pain, unspecified: Secondary | ICD-10-CM | POA: Diagnosis not present

## 2021-02-12 DIAGNOSIS — G8929 Other chronic pain: Secondary | ICD-10-CM | POA: Insufficient documentation

## 2021-02-12 DIAGNOSIS — M6281 Muscle weakness (generalized): Secondary | ICD-10-CM | POA: Insufficient documentation

## 2021-02-12 DIAGNOSIS — M79671 Pain in right foot: Secondary | ICD-10-CM | POA: Diagnosis present

## 2021-02-12 NOTE — Therapy (Signed)
[place], [place], [address] Phone: [phone]   Fax:  [phone]  Physical Therapy Treatment  Patient Details  Name: Kari Ortega MRN: [id number] Date of Birth: [birth year] Referring Provider (PT): Aundra Dubin, [place]   Encounter Date: 02/12/2021   PT End of Session - 02/12/21 1704     Visit Number 5    Number of Visits 16    Date for PT Re-Evaluation 03/20/21    Authorization Type Med Pay    PT Start Time 5102    PT Stop Time 5852    PT Time Calculation (min) 43 min    Activity Tolerance Patient tolerated treatment well;No increased pain    Behavior During Therapy WFL for tasks assessed/performed             Past Medical History:  Diagnosis Date   Anemia    Facial paralysis    c/o facial paralysis with last deliveries.  Unable to determine reason per pt.     Irregular periods     Past Surgical History:  Procedure Laterality Date   CHOLECYSTECTOMY N/A 06/14/2018   Procedure: LAPAROSCOPIC CHOLECYSTECTOMY;  Surgeon: [name], [name], MD;  Location: [place];  Service: General;  Laterality: N/A;   TOOTH EXTRACTION      There were no vitals filed for this visit.   Subjective Assessment - 02/12/21 1705     Subjective Pt presents to PT with reports of continued back and hip pain. She continues to note pain increases in her lower back after she has been sitting for prolonged periods. Overall she notes that symptoms are improving. Ready to begin PT at this time.    Patient is accompained by: Interpreter   [phone]   Currently in Pain? Yes    Pain Score 6     Pain Location Back    Pain Orientation Right;Left;Lower           [facility] Adult PT Treatment/Exercise:   Therapeutic Exercise: - nu-step L5 48m while taking subjective and planning session with patient - STS no UE support 2x10 - Towel scrunch 1'x2 ea flexion - ankle 4 way - YTB x 10 ea - bridge with 5'' hold  2x10 - Supine clamshell - RTB - 2x10 alternating 3''  hold -Supine PPT x 10 - 5 sec hold -Supine PPT w/ ball squeeze 2x10 - 3 sec -Trigger point release to bilat piriformis   Manual therapy(NOT TODAY): concentrating on increasing extensibility of restricted tissue to reduce discomfort and improve mechanics in functional movement: - STM plantar surface of R foot                               PT Short Term Goals - 01/22/21 1756       PT SHORT TERM GOAL #1   Title Pt will be independent in her HEP.    Baseline initial HEP given    Time 3    Period Weeks    Status New    Target Date 02/12/21               PT Long Term Goals - 01/22/21 1800       PT LONG TERM GOAL #1   Title Pt will improve FOTO score to no less than 71% as proxy for functional improvement    Baseline 54% function    Time 8    Period Weeks  Status New    Target Date 03/19/21      PT LONG TERM GOAL #2   Title Pt will decrease 5xSTS to no greater than 15 seconds for improved strength and functional mobility    Baseline 28 sec w/ UE support    Time 8    Period Weeks    Status New    Target Date 03/20/21      PT LONG TERM GOAL #3   Title Pt will self report low back and R LE pain no greater than 3/10 at worst for improving comfort and function    Baseline 7/10 at worst    Time 8    Period Weeks    Status New    Target Date 03/20/21      PT LONG TERM GOAL #4   Title Pt wil be ind in an advanced HE{P    Time 8    Period Weeks    Status New    Target Date 03/20/21      PT LONG TERM GOAL #5   Title Pt will increase R anke DF to no less than 12 deg for improved mobility and foot clearance in gait    Baseline 8 deg DF    Time 8    Period Weeks    Status New    Target Date 03/20/21                   Plan - 02/12/21 1710     Clinical Impression Statement Pt was able to complete prescribed exercises with no adverse effect or change in baseline pain. We  continued to focus on improving core and LE muscle strength. Pt seems to be progressing well, with decreased pain noted in last few sessions. PT will continue to progress exercises as able per POC.    PT Treatment/Interventions ADLs/Self Care Home Management;Electrical Stimulation;Cryotherapy;Moist Heat;Gait training;Stair training;Functional mobility training;Therapeutic activities;Balance training;Therapeutic exercise;Neuromuscular re-education;Patient/family education;Manual techniques;Vasopneumatic Device;Dry needling;Spinal Manipulations;Joint Manipulations    PT Next Visit Plan assess response to HEP, progress core and LE strengthening as able; progress gait without AD    PT Home Exercise Plan [facility]             Patient will benefit from skilled therapeutic intervention in order to improve the following deficits and impairments:  Abnormal gait, Decreased activity tolerance, Decreased balance, Decreased endurance, Decreased mobility, Decreased range of motion, Decreased strength, Difficulty walking, Pain  Visit Diagnosis: Chronic bilateral low back pain, unspecified whether sciatica present  Pain in right foot  Muscle weakness (generalized)     Problem List Patient Active Problem List   Diagnosis Date Noted   Carpal tunnel syndrome of right wrist 11/10/2019   Symptomatic cholelithiasis 06/13/2018   Postpartum care and examination 04/29/2018   Urine frequency 04/29/2018   Normal labor 03/18/2018   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 [place], [place], [address] Phone: [phone]   Fax:  [phone]  Name:  Kari Ortega MRN: [id number] Date of Birth: 03-Sep-1980

## 2021-02-14 ENCOUNTER — Ambulatory Visit: Payer: No Typology Code available for payment source | Admitting: Physical Therapy

## 2021-02-14 ENCOUNTER — Encounter: Payer: Self-pay | Admitting: Physical Therapy

## 2021-02-14 ENCOUNTER — Other Ambulatory Visit: Payer: Self-pay

## 2021-02-14 DIAGNOSIS — M545 Low back pain, unspecified: Secondary | ICD-10-CM | POA: Diagnosis not present

## 2021-02-14 DIAGNOSIS — M79671 Pain in right foot: Secondary | ICD-10-CM

## 2021-02-14 DIAGNOSIS — M6281 Muscle weakness (generalized): Secondary | ICD-10-CM

## 2021-02-14 NOTE — Therapy (Signed)
[place], [place], [address] Phone: [phone]   Fax:  [phone]  Physical Therapy Treatment  Patient Details  Name: Kari Ortega MRN: [id number] Date of Birth: 04-Jul-1980 Referring Provider (PT): Aundra Dubin, [place]   Encounter Date: 02/14/2021   PT End of Session - 02/14/21 1618     Visit Number 6    Number of Visits 16    Date for PT Re-Evaluation 03/20/21    Authorization Type Med Pay    PT Start Time 6962    PT Stop Time 1700    PT Time Calculation (min) 44 min    Activity Tolerance Patient tolerated treatment well;No increased pain    Behavior During Therapy WFL for tasks assessed/performed             Past Medical History:  Diagnosis Date   Anemia    Facial paralysis    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  Unable to determine reason per pt.     Irregular periods     Past Surgical History:  Procedure Laterality Date   CHOLECYSTECTOMY N/A 06/14/2018   Procedure: LAPAROSCOPIC CHOLECYSTECTOMY;  Surgeon: [name], [name], MD;  Location: [place];  Service: General;  Laterality: N/A;   TOOTH EXTRACTION      There were no vitals filed for this visit.   Subjective Assessment - 02/14/21 1622     Subjective Pt reports that she was feeling much better and went a whole day without using the crutches.  This resulted in a high levels of pain at night.    Patient is accompained by: Interpreter   video            [place] Adult PT Treatment/Exercise:   Therapeutic Exercise: - nu-step L5 45m while taking subjective and planning session with patient - Towel scrunch 1'x2 ea flexion   Manual therapy: concentrating on increasing extensibility of restricted tissue to reduce discomfort and improve mechanics in functional movement: - IASTM with percussion device, pt in S/L, bil TFL and glute med  [place]/HM - We discussed the need to slowly resume normal activities, her  imaging (and that there was no fracture or other serious findings), that avoiding activity would lead to a prolonged recovery, use of appropriate weaning from crutches (gradual vs sudden etc), the idea that hurt does not necessarily mean harm; my min/mod receptive     PT Short Term Goals - 01/22/21 1756       PT SHORT TERM GOAL #1   Title Pt will be independent in her HEP.    Baseline initial HEP given    Time 3    Period Weeks    Status New    Target Date 02/12/21               PT Long Term Goals - 01/22/21 1800       PT LONG TERM GOAL #1   Title Pt will improve FOTO score to no less than 71% as proxy for functional improvement    Baseline 54% function    Time 8    Period Weeks    Status New    Target Date 03/19/21      PT LONG TERM GOAL #2   Title Pt will decrease 5xSTS to no greater than 15 seconds for improved strength and functional mobility    Baseline 28 sec w/ UE support    Time 8    Period Weeks    Status  New    Target Date 03/20/21      PT LONG TERM GOAL #3   Title Pt will self report low back and R LE pain no greater than 3/10 at worst for improving comfort and function    Baseline 7/10 at worst    Time 8    Period Weeks    Status New    Target Date 03/20/21      PT LONG TERM GOAL #4   Title Pt wil be ind in an advanced HE{P    Time 8    Period Weeks    Status New    Target Date 03/20/21      PT LONG TERM GOAL #5   Title Pt will increase R anke DF to no less than 12 deg for improved mobility and foot clearance in gait    Baseline 8 deg DF    Time 8    Period Weeks    Status New    Target Date 03/20/21                   Plan - 02/14/21 1833     Clinical Impression Statement We concentrated the majority of the session today on PNE.  Pt has very high fear avoidance.  We discussed pain at length via interpreter.  Pt seems somewhat receptive but then continues to repeat that her hips and foot hurt.  Her hip pain seems consistent with  GTPS (pain directly over each GT).  I will continue to encourage her to challenge herself with her activity level progressively.    PT Treatment/Interventions ADLs/Self Care Home Management;Electrical Stimulation;Cryotherapy;Moist Heat;Gait training;Stair training;Functional mobility training;Therapeutic activities;Balance training;Therapeutic exercise;Neuromuscular re-education;Patient/family education;Manual techniques;Vasopneumatic Device;Dry needling;Spinal Manipulations;Joint Manipulations    PT Next Visit Plan assess response to HEP, progress core and LE strengthening as able; progress gait without AD    PT Home Exercise Plan [facility]             Patient will benefit from skilled therapeutic intervention in order to improve the following deficits and impairments:  Abnormal gait, Decreased activity tolerance, Decreased balance, Decreased endurance, Decreased mobility, Decreased range of motion, Decreased strength, Difficulty walking, Pain  Visit Diagnosis: Chronic bilateral low back pain, unspecified whether sciatica present  Pain in right foot  Muscle weakness (generalized)     Problem List Patient Active Problem List   Diagnosis Date Noted   Carpal tunnel syndrome of right wrist 11/10/2019   Symptomatic cholelithiasis 06/13/2018   Postpartum care and examination 04/29/2018   Urine frequency 04/29/2018   Normal labor 03/18/2018   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 [place], [place], [address] Phone: [phone]   Fax:  [phone]  Name: Kari Ortega MRN: [id number] Date of Birth:  22-Jan-1981

## 2021-02-19 ENCOUNTER — Other Ambulatory Visit: Payer: Self-pay

## 2021-02-19 ENCOUNTER — Ambulatory Visit: Payer: No Typology Code available for payment source | Admitting: Physical Therapy

## 2021-02-19 ENCOUNTER — Encounter: Payer: Self-pay | Admitting: Physical Therapy

## 2021-02-19 DIAGNOSIS — M6281 Muscle weakness (generalized): Secondary | ICD-10-CM

## 2021-02-19 DIAGNOSIS — M545 Low back pain, unspecified: Secondary | ICD-10-CM | POA: Diagnosis not present

## 2021-02-19 DIAGNOSIS — M5412 Radiculopathy, cervical region: Secondary | ICD-10-CM

## 2021-02-19 DIAGNOSIS — G8929 Other chronic pain: Secondary | ICD-10-CM

## 2021-02-19 DIAGNOSIS — M62838 Other muscle spasm: Secondary | ICD-10-CM

## 2021-02-19 DIAGNOSIS — M79671 Pain in right foot: Secondary | ICD-10-CM

## 2021-02-19 NOTE — Therapy (Signed)
[place], [place], [address] Phone: [phone]   Fax:  [phone]  Physical Therapy Treatment  Patient Details  Name: Kari Ortega MRN: [id number] Date of Birth: November 03, 1980 Referring Provider (PT): Aundra Dubin, [place]   Encounter Date: 02/19/2021   PT End of Session - 02/19/21 1700     Visit Number 7    Number of Visits 16    Date for PT Re-Evaluation 03/20/21    Authorization Type Med Pay    PT Start Time 1700    PT Stop Time 4481    PT Time Calculation (min) 45 min    Activity Tolerance Patient tolerated treatment well;No increased pain    Behavior During Therapy WFL for tasks assessed/performed             Past Medical History:  Diagnosis Date   Anemia    Facial paralysis    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  Unable to determine reason per pt.     Irregular periods     Past Surgical History:  Procedure Laterality Date   CHOLECYSTECTOMY N/A 06/14/2018   Procedure: LAPAROSCOPIC CHOLECYSTECTOMY;  Surgeon: [name], [name], MD;  Location: [place];  Service: General;  Laterality: N/A;   TOOTH EXTRACTION      There were no vitals filed for this visit.    [place] Adult PT Treatment/Exercise:   Therapeutic Exercise: - nu-step L5 32m while taking subjective and planning session with patient  - in // x20 ea  - standing march  - standing heel/toe raise  - hip abd  - hip ext  - 2'' step up  - mini squat  - DF/PF on rocker board -LAQ - 5# - 2x10 ea  Therapeutic Activity - collecting information for FOTO and reviewing with patient     PT Short Term Goals - 01/22/21 1756       PT SHORT TERM GOAL #1   Title Pt will be independent in her HEP.    Baseline initial HEP given    Time 3    Period Weeks    Status New    Target Date 02/12/21               PT Long Term Goals - 01/22/21 1800       PT LONG TERM GOAL #1   Title Pt will improve FOTO  score to no less than 71% as proxy for functional improvement    Baseline 54% function    Time 8    Period Weeks    Status New    Target Date 03/19/21      PT LONG TERM GOAL #2   Title Pt will decrease 5xSTS to no greater than 15 seconds for improved strength and functional mobility    Baseline 28 sec w/ UE support    Time 8    Period Weeks    Status New    Target Date 03/20/21      PT LONG TERM GOAL #3   Title Pt will self report low back and R LE pain no greater than 3/10 at worst for improving comfort and function    Baseline 7/10 at worst    Time 8    Period Weeks    Status New    Target Date 03/20/21      PT LONG TERM GOAL #4   Title Pt wil be ind in an advanced HE{P    Time 8  Period Weeks    Status New    Target Date 03/20/21      PT LONG TERM GOAL #5   Title Pt will increase R anke DF to no less than 12 deg for improved mobility and foot clearance in gait    Baseline 8 deg DF    Time 8    Period Weeks    Status New    Target Date 03/20/21                   Plan - 02/19/21 1731     Clinical Impression Statement Pt reports a mild increase in pain following therapy  HEP was reviewed, but left unchanged    Overall, Kari Ortega is progressing fair with therapy.  Today we concentrated on lower extremity strengthening, quad strengthening, and functional strengthening.  We shifted to work on more standing exercises today beginning graded exercises in standing.  We will progress these as appropriate and monitor response.  Pt will continue to benefit from skilled physical therapy to address remaining deficits and achieve listed goals.  Continue per POC.    PT Treatment/Interventions ADLs/Self Care Home Management;Electrical Stimulation;Cryotherapy;Moist Heat;Gait training;Stair training;Functional mobility training;Therapeutic activities;Balance training;Therapeutic exercise;Neuromuscular re-education;Patient/family education;Manual  techniques;Vasopneumatic Device;Dry needling;Spinal Manipulations;Joint Manipulations    PT Next Visit Plan assess response to HEP, progress core and LE strengthening as able; progress gait without AD    PT Home Exercise Plan Kari Ortega             Patient will benefit from skilled therapeutic intervention in order to improve the following deficits and impairments:  Abnormal gait, Decreased activity tolerance, Decreased balance, Decreased endurance, Decreased mobility, Decreased range of motion, Decreased strength, Difficulty walking, Pain  Visit Diagnosis: Chronic bilateral low back pain, unspecified whether sciatica present  Pain in right foot  Muscle weakness (generalized)  Radiculopathy of cervical spine  Muscle spasms of neck     Problem List Patient Active Problem List   Diagnosis Date Noted   Carpal tunnel syndrome of right wrist 11/10/2019   Symptomatic cholelithiasis 06/13/2018   Postpartum care and examination 04/29/2018   Urine frequency 04/29/2018   Normal labor 03/18/2018   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 [place], [place], [address] Phone: [phone]   Fax:  [phone]  Name: Kari Ortega MRN: [id number] Date of Birth: 1980-11-15

## 2021-02-21 ENCOUNTER — Other Ambulatory Visit: Payer: Self-pay

## 2021-02-21 ENCOUNTER — Ambulatory Visit: Payer: No Typology Code available for payment source | Admitting: Physical Therapy

## 2021-02-21 ENCOUNTER — Encounter: Payer: Self-pay | Admitting: Physical Therapy

## 2021-02-21 DIAGNOSIS — G8929 Other chronic pain: Secondary | ICD-10-CM

## 2021-02-21 DIAGNOSIS — M79671 Pain in right foot: Secondary | ICD-10-CM

## 2021-02-21 DIAGNOSIS — M545 Low back pain, unspecified: Secondary | ICD-10-CM

## 2021-02-21 DIAGNOSIS — M62838 Other muscle spasm: Secondary | ICD-10-CM

## 2021-02-21 DIAGNOSIS — M6281 Muscle weakness (generalized): Secondary | ICD-10-CM

## 2021-02-21 DIAGNOSIS — M5412 Radiculopathy, cervical region: Secondary | ICD-10-CM

## 2021-02-21 NOTE — Therapy (Signed)
[place], [place], [address] Phone: [phone]   Fax:  [phone]  Physical Therapy Treatment  Patient Details  Name: Kari Ortega MRN: [id number] Date of Birth: 05-Sep-1980 Referring Provider (PT): Aundra Dubin, [place]   Encounter Date: 02/21/2021   PT End of Session - 02/21/21 1658     Visit Number 8    Number of Visits 16    Date for PT Re-Evaluation 03/20/21    Authorization Type Med Pay    PT Start Time 1700    PT Stop Time 3976    PT Time Calculation (min) 45 min    Activity Tolerance Patient tolerated treatment well;No increased pain    Behavior During Therapy WFL for tasks assessed/performed             Past Medical History:  Diagnosis Date   Anemia    Facial paralysis    c/o facial paralysis with last deliveries.  Unable to determine reason per pt.     Irregular periods     Past Surgical History:  Procedure Laterality Date   CHOLECYSTECTOMY N/A 06/14/2018   Procedure: LAPAROSCOPIC CHOLECYSTECTOMY;  Surgeon: [name], [name], MD;  Location: [place];  Service: General;  Laterality: N/A;   TOOTH EXTRACTION      There were no vitals filed for this visit.   Subjective Assessment - 02/21/21 1704     Subjective Pt reports that her pain is somewhat better today, but she continues to have pain.  She has been walking without her crutches.  She is fearful of not using the crutches.  Her current pain in the R and L foot is 6/10.    Patient is accompained by: Interpreter   video            Therapeutic Exercise: - nu-step L5 39m while taking subjective and planning session with patient   - in // 2x20 ea w/ UE support             - standing march             - standing heel/toe raise             - hip abd             - hip ext - in // 20x ea w/ UE support             - 4'' step up             - mini squat (not today)             - DF/PF on rocker board  (NT) -LAQ - 6# - 2x10 ea  Manual therapy, concentrating on increasing extensibility of restricted tissue to reduce discomfort and improve mechanics in functional movement:  IASTM with percussion device, pt in prone, posterior LEs     PT Short Term Goals - 01/22/21 1756       PT SHORT TERM GOAL #1   Title Pt will be independent in her HEP.    Baseline initial HEP given    Time 3    Period Weeks    Status New    Target Date 02/12/21               PT Long Term Goals - 01/22/21 1800       PT LONG TERM GOAL #1   Title Pt will improve FOTO score to no less than 71% as proxy for  functional improvement    Baseline 54% function    Time 8    Period Weeks    Status New    Target Date 03/19/21      PT LONG TERM GOAL #2   Title Pt will decrease 5xSTS to no greater than 15 seconds for improved strength and functional mobility    Baseline 28 sec w/ UE support    Time 8    Period Weeks    Status New    Target Date 03/20/21      PT LONG TERM GOAL #3   Title Pt will self report low back and R LE pain no greater than 3/10 at worst for improving comfort and function    Baseline 7/10 at worst    Time 8    Period Weeks    Status New    Target Date 03/20/21      PT LONG TERM GOAL #4   Title Pt wil be ind in an advanced HE{P    Time 8    Period Weeks    Status New    Target Date 03/20/21      PT LONG TERM GOAL #5   Title Pt will increase R anke DF to no less than 12 deg for improved mobility and foot clearance in gait    Baseline 8 deg DF    Time 8    Period Weeks    Status New    Target Date 03/20/21                   Plan - 02/21/21 1717     Clinical Impression Statement Pt reports no increase in baseline pain following therapy  HEP was reviewed, but left unchanged    Overall, Kari Ortega is progressing fair with therapy.  Today we concentrated on lower extremity strengthening, hip strengthening, and increasing activity tolerance.  Pt  is able to significantly increase volume of standing exercises today.  She does have some complaint of LE pain, but this diminishes with rest.  I tried to consistently reinforce that some disomfort was normal with exercise after extended [facility]; pt somewhat receptive. Pt will continue to benefit from skilled physical therapy to address remaining deficits and achieve listed goals.  Continue per POC.    PT Treatment/Interventions ADLs/Self Care Home Management;Electrical Stimulation;Cryotherapy;Moist Heat;Gait training;Stair training;Functional mobility training;Therapeutic activities;Balance training;Therapeutic exercise;Neuromuscular re-education;Patient/family education;Manual techniques;Vasopneumatic Device;Dry needling;Spinal Manipulations;Joint Manipulations    PT Next Visit Plan assess response to HEP, progress core and LE strengthening as able; progress gait without AD    PT Home Exercise Plan [facility]             Patient will benefit from skilled therapeutic intervention in order to improve the following deficits and impairments:  Abnormal gait, Decreased activity tolerance, Decreased balance, Decreased endurance, Decreased mobility, Decreased range of motion, Decreased strength, Difficulty walking, Pain  Visit Diagnosis: Chronic bilateral low back pain, unspecified whether sciatica present  Pain in right foot  Muscle weakness (generalized)  Radiculopathy of cervical spine  Muscle spasms of neck     Problem List Patient Active Problem List   Diagnosis Date Noted   Carpal tunnel syndrome of right wrist 11/10/2019   Symptomatic cholelithiasis 06/13/2018   Postpartum care and examination 04/29/2018   Urine frequency 04/29/2018   Normal labor 03/18/2018   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

## 2021-02-28 ENCOUNTER — Ambulatory Visit: Payer: No Typology Code available for payment source

## 2021-02-28 ENCOUNTER — Other Ambulatory Visit: Payer: Self-pay

## 2021-02-28 DIAGNOSIS — M545 Low back pain, unspecified: Secondary | ICD-10-CM | POA: Diagnosis not present

## 2021-02-28 DIAGNOSIS — M6281 Muscle weakness (generalized): Secondary | ICD-10-CM

## 2021-02-28 DIAGNOSIS — M79671 Pain in right foot: Secondary | ICD-10-CM

## 2021-02-28 NOTE — Therapy (Signed)
[place], [place], [address] Phone: [phone]   Fax:  [phone]  Physical Therapy Treatment  Patient Details  Name: Kari Ortega MRN: [id number] Date of Birth: July 13, 1980 Referring Provider (PT): Aundra Dubin, [place]   Encounter Date: 02/28/2021   PT End of Session - 02/28/21 1655     Visit Number 9    Number of Visits 16    Date for PT Re-Evaluation 03/20/21    Authorization Type Med Pay    PT Start Time 1638    PT Stop Time 1739    PT Time Calculation (min) 42 min    Activity Tolerance Patient tolerated treatment well;No increased pain    Behavior During Therapy WFL for tasks assessed/performed             Past Medical History:  Diagnosis Date   Anemia    Facial paralysis    c/o facial paralysis with last deliveries.  Unable to determine reason per pt.     Irregular periods     Past Surgical History:  Procedure Laterality Date   CHOLECYSTECTOMY N/A 06/14/2018   Procedure: LAPAROSCOPIC CHOLECYSTECTOMY;  Surgeon: [name], [name], MD;  Location: [place];  Service: General;  Laterality: N/A;   TOOTH EXTRACTION      There were no vitals filed for this visit.   Subjective Assessment - 02/28/21 1700     Subjective Pt presents to PT with reports of R foot and bilateral hip pain. She has been fairly compliant with her HEP, notes that they increase her muscle soreness. She is ready to begin PT treatment at this time.    Currently in Pain? Yes    Pain Score 6     Pain Location Foot    Pain Orientation Right           [facility] Adult PT Treatment/Exercise: Therapeutic Exercise: - nu-step L5 66m while taking subjective and planning session with patient - in // 2x20 ea w/ UE support             - standing march             - standing heel/toe raise             - hip abd             - hip ext - Bridge 2x10 - supine clamshell 2x15 green tband -LAQ - 6# - 2x10 ea    Manual therapy, concentrating on increasing extensibility of restricted tissue to reduce discomfort and improve mechanics in functional movement:   IASTM with percussion device, pt in prone, posterior LEs                               PT Short Term Goals - 01/22/21 1756       PT SHORT TERM GOAL #1   Title Pt will be independent in her HEP.    Baseline initial HEP given    Time 3    Period Weeks    Status New    Target Date 02/12/21               PT Long Term Goals - 01/22/21 1800       PT LONG TERM GOAL #1   Title Pt will improve FOTO score to no less than 71% as proxy for functional improvement    Baseline 54% function    Time  8    Period Weeks    Status New    Target Date 03/19/21      PT LONG TERM GOAL #2   Title Pt will decrease 5xSTS to no greater than 15 seconds for improved strength and functional mobility    Baseline 28 sec w/ UE support    Time 8    Period Weeks    Status New    Target Date 03/20/21      PT LONG TERM GOAL #3   Title Pt will self report low back and R LE pain no greater than 3/10 at worst for improving comfort and function    Baseline 7/10 at worst    Time 8    Period Weeks    Status New    Target Date 03/20/21      PT LONG TERM GOAL #4   Title Pt wil be ind in an advanced HE{P    Time 8    Period Weeks    Status New    Target Date 03/20/21      PT LONG TERM GOAL #5   Title Pt will increase R anke DF to no less than 12 deg for improved mobility and foot clearance in gait    Baseline 8 deg DF    Time 8    Period Weeks    Status New    Target Date 03/20/21                   Plan - 02/28/21 1710     Clinical Impression Statement Pt was able to complete prescribed exercises with no adverse effect or change in baseline. Today we continued to focus on general LE strengthening in order to decrease pain and improve functional mobility. Pt continues to report fatigue and muscle soreness with  exercises, appears to be slightly self limiting. Will continue to progress exercises as tolerated per POC.    PT Treatment/Interventions ADLs/Self Care Home Management;Electrical Stimulation;Cryotherapy;Moist Heat;Gait training;Stair training;Functional mobility training;Therapeutic activities;Balance training;Therapeutic exercise;Neuromuscular re-education;Patient/family education;Manual techniques;Vasopneumatic Device;Dry needling;Spinal Manipulations;Joint Manipulations    PT Next Visit Plan assess response to HEP, progress core and LE strengthening as able; progress gait without AD    PT Home Exercise Plan [facility]             Patient will benefit from skilled therapeutic intervention in order to improve the following deficits and impairments:  Abnormal gait, Decreased activity tolerance, Decreased balance, Decreased endurance, Decreased mobility, Decreased range of motion, Decreased strength, Difficulty walking, Pain  Visit Diagnosis: Chronic bilateral low back pain, unspecified whether sciatica present  Pain in right foot  Muscle weakness (generalized)     Problem List Patient Active Problem List   Diagnosis Date Noted   Carpal tunnel syndrome of right wrist 11/10/2019   Symptomatic cholelithiasis 06/13/2018   Postpartum care and examination 04/29/2018   Urine frequency 04/29/2018   Normal labor 03/18/2018   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

## 2021-03-05 ENCOUNTER — Ambulatory Visit: Payer: No Typology Code available for payment source

## 2021-03-05 ENCOUNTER — Other Ambulatory Visit: Payer: Self-pay

## 2021-03-05 DIAGNOSIS — M79671 Pain in right foot: Secondary | ICD-10-CM

## 2021-03-05 DIAGNOSIS — M545 Low back pain, unspecified: Secondary | ICD-10-CM | POA: Diagnosis not present

## 2021-03-05 DIAGNOSIS — M6281 Muscle weakness (generalized): Secondary | ICD-10-CM

## 2021-03-05 DIAGNOSIS — G8929 Other chronic pain: Secondary | ICD-10-CM

## 2021-03-05 NOTE — Therapy (Signed)
[place], [place], [address] Phone: [phone]   Fax:  [phone]  Physical Therapy Treatment  Patient Details  Name: Kari Ortega MRN: [id number] Date of Birth: 11/13/1980 Referring Provider (PT): Aundra Dubin, [place]   Encounter Date: 03/05/2021   PT End of Session - 03/05/21 1707     Visit Number 10    Number of Visits 16    Date for PT Re-Evaluation 03/20/21    Authorization Type Med Pay    PT Start Time 4097    PT Stop Time 3532    PT Time Calculation (min) 40 min    Activity Tolerance Patient tolerated treatment well;No increased pain    Behavior During Therapy WFL for tasks assessed/performed             Past Medical History:  Diagnosis Date   Anemia    Facial paralysis    c/o facial paralysis with last deliveries.  Unable to determine reason per pt.     Irregular periods     Past Surgical History:  Procedure Laterality Date   CHOLECYSTECTOMY N/A 06/14/2018   Procedure: LAPAROSCOPIC CHOLECYSTECTOMY;  Surgeon: [name], [name], MD;  Location: [place];  Service: General;  Laterality: N/A;   TOOTH EXTRACTION      There were no vitals filed for this visit.   Subjective Assessment - 03/05/21 1707     Subjective Pt presents to PT with reports of decreased pain in lower back and R foot. She has been compliant with her HEP with no adverse effect. Pt is ready to begin PT at this time,.    Patient is accompained by: Interpreter   Leanna Sato [phone]   Currently in Pain? Yes    Pain Score 4    4/10 low back   Pain Location Foot    Pain Orientation Right           [facility] Adult PT Treatment/Exercise: Therapeutic Exercise: - nu-step L5 47m while taking subjective and planning session with patient - Step up x 10 ea 6in - lateral walk yellow tband x 1 lap in // - standing hip ext 2x10 yellow tband - Bridge 2x10 - supine clamshell 2x15 green tband -LAQ - 6# - 2x10 ea -  seated ankle DF/PF green tband x 15 ea   Manual therapy, concentrating on increasing extensibility of restricted tissue to reduce discomfort and improve mechanics in functional movement:   IASTM with percussion device, pt in prone, posterior LEs                               PT Short Term Goals - 01/22/21 1756       PT SHORT TERM GOAL #1   Title Pt will be independent in her HEP.    Baseline initial HEP given    Time 3    Period Weeks    Status New    Target Date 02/12/21               PT Long Term Goals - 01/22/21 1800       PT LONG TERM GOAL #1   Title Pt will improve FOTO score to no less than 71% as proxy for functional improvement    Baseline 54% function    Time 8    Period Weeks    Status New    Target Date 03/19/21      PT LONG  TERM GOAL #2   Title Pt will decrease 5xSTS to no greater than 15 seconds for improved strength and functional mobility    Baseline 28 sec w/ UE support    Time 8    Period Weeks    Status New    Target Date 03/20/21      PT LONG TERM GOAL #3   Title Pt will self report low back and R LE pain no greater than 3/10 at worst for improving comfort and function    Baseline 7/10 at worst    Time 8    Period Weeks    Status New    Target Date 03/20/21      PT LONG TERM GOAL #4   Title Pt wil be ind in an advanced HE{P    Time 8    Period Weeks    Status New    Target Date 03/20/21      PT LONG TERM GOAL #5   Title Pt will increase R anke DF to no less than 12 deg for improved mobility and foot clearance in gait    Baseline 8 deg DF    Time 8    Period Weeks    Status New    Target Date 03/20/21                   Plan - 03/05/21 1709     Clinical Impression Statement Pt was able to complete prescribed exercises with no adverse effect. She continues to progress activity in therapy, does fatigue quickly with proximal hip strengthening. Also notes improvement in symptoms and decrease pain  after IASTM. Will continue to progress as tolerated per POC as prescribed.    PT Treatment/Interventions ADLs/Self Care Home Management;Electrical Stimulation;Cryotherapy;Moist Heat;Gait training;Stair training;Functional mobility training;Therapeutic activities;Balance training;Therapeutic exercise;Neuromuscular re-education;Patient/family education;Manual techniques;Vasopneumatic Device;Dry needling;Spinal Manipulations;Joint Manipulations    PT Next Visit Plan assess response to HEP, progress core and LE strengthening as able; progress gait without AD    PT Home Exercise Plan [facility]             Patient will benefit from skilled therapeutic intervention in order to improve the following deficits and impairments:  Abnormal gait, Decreased activity tolerance, Decreased balance, Decreased endurance, Decreased mobility, Decreased range of motion, Decreased strength, Difficulty walking, Pain  Visit Diagnosis: Chronic bilateral low back pain, unspecified whether sciatica present  Pain in right foot  Muscle weakness (generalized)     Problem List Patient Active Problem List   Diagnosis Date Noted   Carpal tunnel syndrome of right wrist 11/10/2019   Symptomatic cholelithiasis 06/13/2018   Postpartum care and examination 04/29/2018   Urine frequency 04/29/2018   Normal labor 03/18/2018   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 [place], [place], [address] Phone: [phone]   Fax:  [phone]  Name: Kari Ortega MRN: [id number] Date of Birth: [birth year]

## 2021-03-12 ENCOUNTER — Ambulatory Visit: Payer: Self-pay | Attending: Physician Assistant

## 2021-03-12 ENCOUNTER — Other Ambulatory Visit: Payer: Self-pay

## 2021-03-12 DIAGNOSIS — G8929 Other chronic pain: Secondary | ICD-10-CM | POA: Insufficient documentation

## 2021-03-12 DIAGNOSIS — M6281 Muscle weakness (generalized): Secondary | ICD-10-CM | POA: Insufficient documentation

## 2021-03-12 DIAGNOSIS — M79671 Pain in right foot: Secondary | ICD-10-CM | POA: Insufficient documentation

## 2021-03-12 DIAGNOSIS — M545 Low back pain, unspecified: Secondary | ICD-10-CM | POA: Insufficient documentation

## 2021-03-12 NOTE — Therapy (Signed)
[place], [place], [address] Phone: [phone]   Fax:  [phone]  Physical Therapy Treatment  Patient Details  Name: Kari Ortega MRN: [id number] Date of Birth: [birth year] Referring Provider (PT): Aundra Dubin, [place]   Encounter Date: 03/12/2021   PT End of Session - 03/12/21 1708     Visit Number 11    Number of Visits 16    Date for PT Re-Evaluation 03/20/21    Authorization Type Med Pay    PT Start Time 2951    PT Stop Time 8841    PT Time Calculation (min) 38 min    Activity Tolerance Patient tolerated treatment well;No increased pain    Behavior During Therapy WFL for tasks assessed/performed             Past Medical History:  Diagnosis Date   Anemia    Facial paralysis    c/o facial paralysis with last deliveries.  Unable to determine reason per pt.     Irregular periods     Past Surgical History:  Procedure Laterality Date   CHOLECYSTECTOMY N/A 06/14/2018   Procedure: LAPAROSCOPIC CHOLECYSTECTOMY;  Surgeon: [name], [name], MD;  Location: [place];  Service: General;  Laterality: N/A;   TOOTH EXTRACTION      There were no vitals filed for this visit.   Subjective Assessment - 03/12/21 1708     Subjective Pt presents to PT with overall reports of continued improvement. Does note some LBP when returning form a fwd flexion. Is ready ot begin PT at this time.    Currently in Pain? Yes    Pain Score 7     Pain Location Back    Pain Orientation Lower           [facility] Adult PT Treatment/Exercise: Therapeutic Exercise: nu-step L5 44m while taking subjective and planning session with patient Step up x 10 ea 6in Lateral walk yellow tband x 2 lap in // Standing hip ext 2x10 yellow tband Standing mini squat 2x10 w/ UE support Bridge w/ clamshell 3x0 green tband Supine clamshell 2x15 green tband  Past Interventions Not Performed Today: LAQ - 6# - 2x10  ea seated ankle DF/PF green tband x 15 ea   Manual therapy, concentrating on increasing extensibility of restricted tissue to reduce discomfort and improve mechanics in functional movement: IASTM with percussion device, pt in prone, posterior LEs                               PT Short Term Goals - 01/22/21 1756       PT SHORT TERM GOAL #1   Title Pt will be independent in her HEP.    Baseline initial HEP given    Time 3    Period Weeks    Status New    Target Date 02/12/21               PT Long Term Goals - 01/22/21 1800       PT LONG TERM GOAL #1   Title Pt will improve FOTO score to no less than 71% as proxy for functional improvement    Baseline 54% function    Time 8    Period Weeks    Status New    Target Date 03/19/21      PT LONG TERM GOAL #2   Title Pt will decrease 5xSTS to no greater than  15 seconds for improved strength and functional mobility    Baseline 28 sec w/ UE support    Time 8    Period Weeks    Status New    Target Date 03/20/21      PT LONG TERM GOAL #3   Title Pt will self report low back and R LE pain no greater than 3/10 at worst for improving comfort and function    Baseline 7/10 at worst    Time 8    Period Weeks    Status New    Target Date 03/20/21      PT LONG TERM GOAL #4   Title Pt wil be ind in an advanced HE{P    Time 8    Period Weeks    Status New    Target Date 03/20/21      PT LONG TERM GOAL #5   Title Pt will increase R anke DF to no less than 12 deg for improved mobility and foot clearance in gait    Baseline 8 deg DF    Time 8    Period Weeks    Status New    Target Date 03/20/21                   Plan - 03/12/21 1718     Clinical Impression Statement Pt was able to complete prescribed exercises with no adverse effect or change in baseline. She responded well to manual therapy interventions, noting decreased pain post session. Today's session again focused on proximal  hip strengthening, with PT also providing education on improving lifting mechanics. She continues to benefit from skilled PT and will continue to be seen and progressed as tolerated.    PT Treatment/Interventions ADLs/Self Care Home Management;Electrical Stimulation;Cryotherapy;Moist Heat;Gait training;Stair training;Functional mobility training;Therapeutic activities;Balance training;Therapeutic exercise;Neuromuscular re-education;Patient/family education;Manual techniques;Vasopneumatic Device;Dry needling;Spinal Manipulations;Joint Manipulations    PT Next Visit Plan assess response to HEP, progress core and LE strengthening as able; progress gait without AD    PT Home Exercise Plan [facility]             Patient will benefit from skilled therapeutic intervention in order to improve the following deficits and impairments:  Abnormal gait, Decreased activity tolerance, Decreased balance, Decreased endurance, Decreased mobility, Decreased range of motion, Decreased strength, Difficulty walking, Pain  Visit Diagnosis: Chronic bilateral low back pain, unspecified whether sciatica present  Pain in right foot  Muscle weakness (generalized)     Problem List Patient Active Problem List   Diagnosis Date Noted   Carpal tunnel syndrome of right wrist 11/10/2019   Symptomatic cholelithiasis 06/13/2018   Postpartum care and examination 04/29/2018   Urine frequency 04/29/2018   Normal labor 03/18/2018   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

## 2021-03-14 ENCOUNTER — Ambulatory Visit: Payer: Self-pay

## 2021-03-14 ENCOUNTER — Other Ambulatory Visit: Payer: Self-pay

## 2021-03-14 DIAGNOSIS — M79671 Pain in right foot: Secondary | ICD-10-CM

## 2021-03-14 DIAGNOSIS — M6281 Muscle weakness (generalized): Secondary | ICD-10-CM

## 2021-03-14 DIAGNOSIS — M545 Low back pain, unspecified: Secondary | ICD-10-CM

## 2021-03-15 NOTE — Therapy (Signed)
[place], [place], [address] Phone: [phone]   Fax:  [phone]  Physical Therapy Treatment/Discharge  Patient Details  Name: Kari Ortega MRN: [id number] Date of Birth: 05-Oct-1980 Referring Provider (PT): Aundra Dubin, [place]   Encounter Date: 03/14/2021   PT End of Session - 03/14/21 1713     Visit Number 12    Number of Visits 16    Date for PT Re-Evaluation 03/20/21    Authorization Type Med Pay    PT Start Time 2423   arrived late   PT Stop Time 5361    PT Time Calculation (min) 32 min    Activity Tolerance Patient tolerated treatment well;No increased pain    Behavior During Therapy WFL for tasks assessed/performed             Past Medical History:  Diagnosis Date   Anemia    Facial paralysis    c/o facial paralysis with last deliveries.  Unable to determine reason per pt.     Irregular periods     Past Surgical History:  Procedure Laterality Date   CHOLECYSTECTOMY N/A 06/14/2018   Procedure: LAPAROSCOPIC CHOLECYSTECTOMY;  Surgeon: [name], [name], MD;  Location: [facility];  Service: General;  Laterality: N/A;   TOOTH EXTRACTION      There were no vitals filed for this visit.   Subjective Assessment - 03/14/21 1713     Subjective Pt presents to PT with reports that overall she is doing better, but she continues to have some lower back pain. She has been compliant with her HEP. Pt is ready to begin PT treatment at this time.    Currently in Pain? Yes    Pain Score 5     Pain Location Back    Pain Orientation Lower           [facility] Adult PT Treatment/Exercise:   Therapeutic Exercise:  Supine PPT x 10 Bridge x 10 Supine clam x 10 blue tband Supine piriformis stretch x 30 sec ea Mini squat bilat UE support x 10                               PT Short Term Goals - 01/22/21 1756       PT SHORT TERM GOAL #1   Title Pt will  be independent in her HEP.    Baseline initial HEP given    Time 3    Period Weeks    Status New    Target Date 02/12/21               PT Long Term Goals - 03/14/21 1723       PT LONG TERM GOAL #1   Title Pt will improve FOTO score to no less than 71% as proxy for functional improvement    Baseline 54% function on 03/14/21    Time 8    Period Weeks    Status Partially Met      PT LONG TERM GOAL #2   Title Pt will decrease 5xSTS to no greater than 15 seconds for improved strength and functional mobility    Baseline 28 sec w/ UE support; update on 03/14/21 - 16 seconds    Time 8    Period Weeks    Status Partially Met   mostly met     PT LONG TERM GOAL #3   Title Pt will self report  low back and R LE pain no greater than 3/10 at worst for improving comfort and function    Baseline 7/10 at worst; update - 5/10 on 03/14/21    Time 8    Period Weeks    Status Partially Met      PT LONG TERM GOAL #4   Title Pt wil be ind in an advanced HEP    Time 8    Period Weeks    Status Achieved      PT LONG TERM GOAL #5   Title Pt will increase R anke DF to no less than 12 deg for improved mobility and foot clearance in gait    Baseline 8 deg DF; 12 degrees on 03/14/21    Time 8    Period Weeks    Status Achieved                   Plan - 03/15/21 1359     Clinical Impression Statement Pt was able to complete prescribed exercises and demonstrated knowledge of HEP with no adverse effect. Over the course of PT treatment, she has improved her functional mobility, as evidenced by decreased 5xSTS time. She has decreased her required ambulatory assistance, no longer using CAM boot on R LE or axillary crutches. While her FOTO score has remained the same since update and she continues to have c/o slight LBP, she has improved and maximized benefit from skilled PT at this time. She should continue to improve with HEP compliance and is being discharged at this time.    PT  Treatment/Interventions ADLs/Self Care Home Management;Electrical Stimulation;Cryotherapy;Moist Heat;Gait training;Stair training;Functional mobility training;Therapeutic activities;Balance training;Therapeutic exercise;Neuromuscular re-education;Patient/family education;Manual techniques;Vasopneumatic Device;Dry needling;Spinal Manipulations;Joint Manipulations    PT Home Exercise Plan [id number]    Consulted and Agree with Plan of Care Patient             Patient will benefit from skilled therapeutic intervention in order to improve the following deficits and impairments:  Abnormal gait, Decreased activity tolerance, Decreased balance, Decreased endurance, Decreased mobility, Decreased range of motion, Decreased strength, Difficulty walking, Pain  Visit Diagnosis: Chronic bilateral low back pain, unspecified whether sciatica present  Pain in right foot  Muscle weakness (generalized)     Problem List Patient Active Problem List   Diagnosis Date Noted   Carpal tunnel syndrome of right wrist 11/10/2019   Symptomatic cholelithiasis 06/13/2018   Postpartum care and examination 04/29/2018   Urine frequency 04/29/2018   Normal labor 03/18/2018   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 [place], [place], [address] Phone: [phone]   Fax:  [phone]  Name: Kari Ortega MRN: [id number] Date of Birth: 04-18-1981  PHYSICAL THERAPY DISCHARGE SUMMARY  Visits from Start of Care: 12  Current functional level related to goals / functional outcomes: See objective and goals   Remaining deficits: See objective and  goals   Education / Equipment: HEP   Patient agrees to discharge. Patient goals were  mostly met . Patient is being discharged due to maximized rehab potential.

## 2021-03-19 ENCOUNTER — Other Ambulatory Visit: Payer: Self-pay

## 2021-03-19 ENCOUNTER — Encounter: Payer: Self-pay | Admitting: Internal Medicine

## 2021-03-19 ENCOUNTER — Ambulatory Visit: Payer: Self-pay | Attending: Internal Medicine | Admitting: Internal Medicine

## 2021-03-19 VITALS — BP 131/88 | HR 71 | Ht 60.0 in | Wt 175.2 lb

## 2021-03-19 DIAGNOSIS — M545 Low back pain, unspecified: Secondary | ICD-10-CM

## 2021-03-19 DIAGNOSIS — G8929 Other chronic pain: Secondary | ICD-10-CM

## 2021-03-19 DIAGNOSIS — Z23 Encounter for immunization: Secondary | ICD-10-CM

## 2021-03-19 MED ORDER — OMEPRAZOLE 20 MG PO CPDR
20.0000 mg | DELAYED_RELEASE_CAPSULE | Freq: Every day | ORAL | 2 refills | Status: DC
  Filled 2021-03-19: qty 30, 30d supply, fill #0

## 2021-03-19 MED ORDER — CELECOXIB 200 MG PO CAPS
200.0000 mg | ORAL_CAPSULE | Freq: Every day | ORAL | 3 refills | Status: DC
  Filled 2021-03-19: qty 30, 30d supply, fill #0

## 2021-03-19 NOTE — Progress Notes (Signed)
Kari Ortega [id number]
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
## 2021-03-26 ENCOUNTER — Other Ambulatory Visit: Payer: Self-pay

## 2021-03-26 ENCOUNTER — Ambulatory Visit (INDEPENDENT_AMBULATORY_CARE_PROVIDER_SITE_OTHER): Payer: Self-pay | Admitting: Orthopaedic Surgery

## 2021-03-26 ENCOUNTER — Encounter: Payer: Self-pay | Admitting: Orthopaedic Surgery

## 2021-03-26 DIAGNOSIS — M545 Low back pain, unspecified: Secondary | ICD-10-CM

## 2021-03-26 DIAGNOSIS — G8929 Other chronic pain: Secondary | ICD-10-CM

## 2021-03-26 MED ORDER — TRAMADOL HCL 50 MG PO TABS
50.0000 mg | ORAL_TABLET | Freq: Two times a day (BID) | ORAL | 2 refills | Status: DC | PRN
  Filled 2021-03-26: qty 30, 15d supply, fill #0
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
## 2021-04-01 ENCOUNTER — Ambulatory Visit: Payer: Self-pay | Attending: Internal Medicine

## 2021-04-01 ENCOUNTER — Other Ambulatory Visit: Payer: Self-pay

## 2021-04-02 ENCOUNTER — Other Ambulatory Visit: Payer: Self-pay

## 2021-04-17 ENCOUNTER — Ambulatory Visit
Admission: RE | Admit: 2021-04-17 | Discharge: 2021-04-17 | Disposition: A | Discharge status: Home / Self Care | Payer: Self-pay | Source: Ambulatory Visit | Attending: Orthopaedic Surgery | Admitting: Orthopaedic Surgery

## 2021-04-17 ENCOUNTER — Other Ambulatory Visit: Payer: Self-pay

## 2021-04-17 DIAGNOSIS — M545 Low back pain, unspecified: Secondary | ICD-10-CM

## 2021-04-30 ENCOUNTER — Other Ambulatory Visit: Payer: Self-pay

## 2021-04-30 ENCOUNTER — Encounter: Payer: Self-pay | Admitting: Orthopaedic Surgery

## 2021-04-30 ENCOUNTER — Ambulatory Visit (INDEPENDENT_AMBULATORY_CARE_PROVIDER_SITE_OTHER): Payer: Self-pay | Admitting: Orthopaedic Surgery

## 2021-04-30 DIAGNOSIS — M5442 Lumbago with sciatica, left side: Secondary | ICD-10-CM

## 2021-04-30 DIAGNOSIS — M5441 Lumbago with sciatica, right side: Secondary | ICD-10-CM

## 2021-04-30 DIAGNOSIS — G8929 Other chronic pain: Secondary | ICD-10-CM

## 2021-04-30 MED ORDER — TRAMADOL HCL 50 MG PO TABS
50.0000 mg | ORAL_TABLET | Freq: Two times a day (BID) | ORAL | 2 refills | Status: DC | PRN
  Filled 2021-04-30: qty 30, 15d supply, fill #0

## 2021-04-30 NOTE — Progress Notes (Signed)
° °  Office Visit Note   Patient: Kari Ortega           Date of Birth: 09-24-1980           MRN: [id number] Visit Date: 04/30/2021              Requested by: Ladell Pier, MD [address] [place],  [place] [address] PCP: Ladell Pier, MD   Assessment & Plan: Visit Diagnoses:  1. Chronic bilateral low back pain with bilateral sciatica     Plan: impression is chronic bilateral low back pain with referral to both legs with evidence of disc protrusion at L4-5 and L5-S1.  I have discussed referring the patient to Dr. Ernestina Patches for ESI.  She would like for [place] to make the referral but will also talk to her husband about this.    Follow-Up Instructions: Return if symptoms worsen or fail to improve.   Orders:  No orders of the defined types were placed in this encounter.  No orders of the defined types were placed in this encounter.     Procedures: No procedures performed   Clinical Data: No additional findings.   Subjective: Chief Complaint  Patient presents with   Lower Back - Pain    HPI patient is a pleasant 40 year old female who comes in today to discuss MRI results of the lumbar spine.  She has been dealing with chronic low back pain with referred pain to both legs for several months.  She has tried a course of steroids and physical therapy without relief.  MRI lumbar spine ordered which shows disc protrusion L4-5 and L5-S1.     Objective: Vital Signs: There were no vitals taken for this visit.    Ortho Exam stable lumbar exam  Specialty Comments:  No specialty comments available.  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  Unable to determine reason per pt.     Irregular periods     Family History  Problem Relation Age of Onset   Diabetes Mother     Past Surgical History:  Procedure Laterality Date   CHOLECYSTECTOMY N/A 06/14/2018   Procedure: LAPAROSCOPIC CHOLECYSTECTOMY;  Surgeon: [name], [name], MD;  Location: [facility];  Service: General;  Laterality: N/A;   TOOTH EXTRACTION     Social History   Occupational History   Not on file  Tobacco Use   Smoking status: Never   Smokeless tobacco: Never  Vaping Use   Vaping Use: Never used  Substance and Sexual Activity   Alcohol use: No   Drug use: No   Sexual activity: Yes    Birth control/protection: None

## 2021-04-30 NOTE — Progress Notes (Incomplete)
° °  Office Visit Note   Patient: Kari Ortega           Date of Birth: 10/19/1980           MRN: [id number] Visit Date: 04/30/2021              Requested by: Ladell Pier, MD [place],  [place] [address] PCP: Ladell Pier, MD   Assessment & Plan: Visit Diagnoses: No diagnosis found.  Plan: ***  Follow-Up Instructions: No follow-ups on file.   Orders:  No orders of the defined types were placed in this encounter.  No orders of the defined types were placed in this encounter.     Procedures: No procedures performed   Clinical Data: No additional findings.   Subjective: Chief Complaint  Patient presents with   Lower Back - Pain    HPI  Review of Systems   Objective: Vital Signs: There were no vitals taken for this visit.  Physical Exam  Ortho Exam  Specialty Comments:  No specialty comments available.  Imaging: No results found.   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  Unable to determine reason per pt.     Irregular periods     Family History  Problem Relation Age of Onset   Diabetes Mother     Past Surgical History:  Procedure Laterality Date   CHOLECYSTECTOMY N/A 06/14/2018   Procedure: LAPAROSCOPIC CHOLECYSTECTOMY;  Surgeon: [name], [name], MD;  Location: [facility];   Service: General;  Laterality: N/A;   TOOTH EXTRACTION     Social History   Occupational History   Not on file  Tobacco Use   Smoking status: Never   Smokeless tobacco: Never  Vaping Use   Vaping Use: Never used  Substance and Sexual Activity   Alcohol use: No   Drug use: No   Sexual activity: Yes    Birth control/protection: None

## 2021-05-03 ENCOUNTER — Telehealth: Payer: Self-pay | Admitting: Orthopaedic Surgery

## 2021-05-03 NOTE — Telephone Encounter (Signed)
Please refer to yates or nitka

## 2021-05-03 NOTE — Telephone Encounter (Signed)
Called pt to get scheduled with Dr.Newton for her injection (L4-5 interlam). She does not want to see Dr.Newton, she wants to see another doctor that specializes in backs. She wants to know what other options she has other than an injection.   CB [phone]

## 2021-05-07 ENCOUNTER — Other Ambulatory Visit: Payer: Self-pay

## 2021-05-07 DIAGNOSIS — M5442 Lumbago with sciatica, left side: Secondary | ICD-10-CM

## 2021-05-07 NOTE — Telephone Encounter (Signed)
Referral made 

## 2021-05-08 ENCOUNTER — Other Ambulatory Visit: Payer: Self-pay

## 2021-05-21 ENCOUNTER — Encounter: Payer: Self-pay | Admitting: Orthopaedic Surgery

## 2021-05-21 ENCOUNTER — Other Ambulatory Visit: Payer: Self-pay

## 2021-05-21 ENCOUNTER — Ambulatory Visit (INDEPENDENT_AMBULATORY_CARE_PROVIDER_SITE_OTHER): Payer: Self-pay | Admitting: Orthopaedic Surgery

## 2021-05-21 DIAGNOSIS — M5136 Other intervertebral disc degeneration, lumbar region: Secondary | ICD-10-CM
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
## 2021-05-24 DIAGNOSIS — M5136 Other intervertebral disc degeneration, lumbar region: Secondary | ICD-10-CM | POA: Insufficient documentation

## 2021-06-04 ENCOUNTER — Encounter: Payer: Self-pay | Admitting: Orthopaedic Surgery

## 2021-06-04 ENCOUNTER — Ambulatory Visit (INDEPENDENT_AMBULATORY_CARE_PROVIDER_SITE_OTHER): Payer: Self-pay | Admitting: Orthopaedic Surgery

## 2021-06-04 ENCOUNTER — Other Ambulatory Visit: Payer: Self-pay

## 2021-06-04 DIAGNOSIS — G8929 Other chronic pain: Secondary | ICD-10-CM

## 2021-06-04 DIAGNOSIS — M545 Low back pain, unspecified: Secondary | ICD-10-CM

## 2021-06-04 NOTE — Progress Notes (Signed)
Office Visit Note   Patient: Kari Ortega           Date of Birth: 1981/04/30           MRN: [id number] Visit Date: 06/04/2021              Requested by: Ladell Pier, MD [address] [place],  [place] [address] PCP: Ladell Pier, MD   Assessment & Plan: Visit Diagnoses:  1. Chronic low back pain, unspecified back pain laterality, unspecified whether sciatica present     Plan: Impression is chronic low back pain with evidence of disc protrusion L4-5 L5-S1.  We again discussed referral to Dr. Ernestina Patches for [facility] versus medication management.  She would like to actually try another course of formal physical therapy.  She would also like to meet with Dr. Ernestina Patches to discuss risk of the injection.  She will follow-up with [place] as needed.  Follow-Up Instructions: Return if symptoms worsen or fail to improve.   Orders:  Orders Placed This Encounter  Procedures   Ambulatory referral to Physical Medicine Rehab   Ambulatory referral to Physical Therapy   No orders of the defined types were placed in this encounter.     Procedures: No procedures performed   Clinical Data: No additional findings.   Subjective: Chief Complaint  Patient presents with   Lower Back - Pain    HPI patient is a pleasant 41 year old Spanish-speaking female who is here today with interpreter.  She is here with continued low back pain and bilateral lower extremity radiculopathy.  Previous MRI of the lumbar spine showed disc protrusion L4-5 and L5-S1.  She has been on a steroid taper and anti-inflammatories without relief.  She has been in physical therapy but she notes that they have not focus solely on her back.  She has recently been seen by Dr. Lorin Mercy where epidural steroid injection and weight loss was recommended.  She has been fearful of proceeding with injection and has actually had increased pain with trying to lose weight by means of exercise.  Review of Systems as detailed in  HPI.  All others reviewed and are negative.   Objective: Vital Signs: There were no vitals taken for this visit.  Physical Exam well-developed well-nourished female no acute distress.  Alert and oriented x3.  Ortho Exam stable lumbar exam  Specialty Comments:  No specialty comments available.  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  Unable to determine reason per pt.     Irregular periods     Family History  Problem Relation Age of Onset   Diabetes Mother     Past Surgical History:  Procedure Laterality Date   CHOLECYSTECTOMY N/A 06/14/2018   Procedure: LAPAROSCOPIC CHOLECYSTECTOMY;  Surgeon: [name], [name], MD;  Location: [facility]  OR;  Service: General;  Laterality: N/A;   TOOTH EXTRACTION     Social History   Occupational History   Not on file  Tobacco Use   Smoking status: Never   Smokeless tobacco: Never  Vaping Use   Vaping Use: Never used  Substance and Sexual Activity   Alcohol use: No   Drug use: No   Sexual activity: Yes    Birth control/protection: None

## 2021-06-14 ENCOUNTER — Other Ambulatory Visit: Payer: Self-pay

## 2021-06-14 ENCOUNTER — Ambulatory Visit (INDEPENDENT_AMBULATORY_CARE_PROVIDER_SITE_OTHER): Payer: Self-pay | Admitting: Physical Medicine and Rehabilitation

## 2021-06-14 ENCOUNTER — Encounter: Payer: Self-pay | Admitting: Physical Medicine and Rehabilitation

## 2021-06-14 VITALS — BP 132/84 | HR 88

## 2021-06-14 DIAGNOSIS — M5416 Radiculopathy, lumbar region: Secondary | ICD-10-CM

## 2021-06-14 DIAGNOSIS — M5442 Lumbago with sciatica, left side: Secondary | ICD-10-CM

## 2021-06-14 DIAGNOSIS — M4726 Other spondylosis with radiculopathy, lumbar region: Secondary | ICD-10-CM

## 2021-06-14 DIAGNOSIS — M5116 Intervertebral disc disorders with radiculopathy, lumbar region: Secondary | ICD-10-CM

## 2021-06-14 DIAGNOSIS — M5441 Lumbago with sciatica, right side: Secondary | ICD-10-CM

## 2021-06-14 DIAGNOSIS — G8929 Other chronic pain: Secondary | ICD-10-CM
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
## 2021-06-20 ENCOUNTER — Ambulatory Visit: Payer: No Typology Code available for payment source | Attending: Physician Assistant

## 2021-06-20 ENCOUNTER — Other Ambulatory Visit: Payer: Self-pay

## 2021-06-20 DIAGNOSIS — M6281 Muscle weakness (generalized): Secondary | ICD-10-CM | POA: Insufficient documentation

## 2021-06-20 DIAGNOSIS — R2689 Other abnormalities of gait and mobility: Secondary | ICD-10-CM | POA: Diagnosis present

## 2021-06-20 DIAGNOSIS — M545 Low back pain, unspecified: Secondary | ICD-10-CM | POA: Diagnosis not present

## 2021-06-20 DIAGNOSIS — G8929 Other chronic pain: Secondary | ICD-10-CM | POA: Diagnosis present
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
## 2021-06-21 ENCOUNTER — Encounter: Payer: Self-pay | Admitting: Internal Medicine

## 2021-06-21 ENCOUNTER — Ambulatory Visit: Payer: Self-pay | Attending: Internal Medicine | Admitting: Internal Medicine

## 2021-06-21 ENCOUNTER — Ambulatory Visit (HOSPITAL_COMMUNITY)
Admission: RE | Admit: 2021-06-21 | Discharge: 2021-06-21 | Disposition: A | Discharge status: Home / Self Care | Payer: Self-pay | Source: Ambulatory Visit | Attending: Internal Medicine | Admitting: Internal Medicine

## 2021-06-21 VITALS — BP 124/85 | HR 81 | Resp 16 | Wt 179.0 lb

## 2021-06-21 DIAGNOSIS — R1084 Generalized abdominal pain: Secondary | ICD-10-CM

## 2021-06-21 MED ORDER — IOHEXOL 300 MG/ML  SOLN
100.0000 mL | Freq: Once | INTRAMUSCULAR | Status: AC | PRN
  Administered 2021-06-21: 100 mL via INTRAVENOUS

## 2021-06-21 NOTE — Progress Notes (Signed)
Let patient know that the CAT scan of her abdomen came back okay without any acute findings to explain her symptoms.  It did show a small spot on her left adrenal gland which is the gland that sits above the kidney.  This has nothing to do with her current symptoms.  However she should schedule a follow-up visit for [place] to do testing to further evaluate the spot on this gland.

## 2021-06-21 NOTE — Progress Notes (Signed)
Patient ID: Kari Ortega, female    DOB: [birth year]  MRN: [id number]  CC: Abdominal Pain   Subjective: Kari Ortega is a 41 y.o. female who presents for UC visit Her concerns today include:  Pt with hx obesity, HL, pre DM  Pt c/o abdominal pain and bloating that started last night.  Started after she ate some tortias with guacamole  Started in epigastric area then spread all over the abdomen Some nausea but no vomiting, no diarrhea.  Pain comes and goes lasting several hrs Felt constipated.  Had BM today Pain no better or worse after eating breakfast and lunch today.  She rates pain as a 5 out of 10.  She had her gallbladder removed.  Last normal menstrual cycle started on 06/04/2021.  Patient Active Problem List   Diagnosis Date Noted   Bulge of lumbar disc without myelopathy 05/24/2021   Carpal tunnel syndrome of right wrist 11/10/2019   Symptomatic cholelithiasis 06/13/2018   Postpartum care and examination 04/29/2018   Urine frequency 04/29/2018   Normal labor 03/18/2018   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   BMI 30-39.9 09/21/2017   Obesity in pregnancy 09/21/2017   Language barrier 09/21/2017   Supervision of high risk pregnancy, antepartum 08/24/2017   AMA (advanced maternal age) multigravida 35+ 08/24/2017   HYPERTRIGLYCERIDEMIA 12/15/2006     Current Outpatient Medications on File Prior to Visit  Medication Sig Dispense Refill   acetaminophen (TYLENOL) 500 MG tablet Take 2 tablets (1,000 mg total) by mouth every 6 (six) hours as needed. 30 tablet 0   amitriptyline (ELAVIL) 10 MG tablet Take 1 tablet (10 mg total) by mouth at bedtime. 30 tablet 5   celecoxib (CELEBREX) 200 MG capsule Take 1 capsule (200 mg total) by mouth daily. 30 capsule 3   Cholecalciferol (VITAMIN D-3) 125 MCG (5000 UT) TABS Take 1 tablet by mouth daily. 90 tablet 3   methocarbamol (ROBAXIN) 500 MG tablet Take 1 tablet (500 mg total) by mouth 2 (two) times daily as needed.  20 tablet 0   miconazole (MICOTIN) 2 % cream Apply to affected area on foot twice a day 28.35 g 0   mupirocin ointment (BACTROBAN) 2 % Apply 1 application topically 2 (two) times daily. Apply twice daily 22 g 0   omeprazole (PRILOSEC) 20 MG capsule Take 1 capsule (20 mg total) by mouth daily. 30 capsule 2   predniSONE (STERAPRED UNI-PAK 21 TAB) 10 MG (21) TBPK tablet Take as directed 21 tablet 0   Sunscreen SPF50 LOTN Apply 1 application topically in the morning, at noon, and at bedtime. 100 mL 0   tetracycline (SUMYCIN) 500 MG capsule Take 500 mg by mouth 2 (two) times daily.     traMADol (ULTRAM) 50 MG tablet Take 1 tablet (50 mg total) by mouth every 12 (twelve) hours as needed. 30 tablet 2   triamcinolone (KENALOG) 0.1 % Apply 1 application topically daily as needed. 30 g 0   Current Facility-Administered Medications on File Prior to Visit  Medication Dose Route Frequency Provider Last Rate Last Admin   polyethylene glycol powder (GLYCOLAX/MIRALAX) container 255 g  1 Container Oral Once Rasch, Artist Pais, NP        No Known Allergies  Social History   Socioeconomic History   Marital status: Married    Spouse name: Not on file   Number of children: Not on file   Years of education: Not on file   Highest education level: Not on file  Occupational History   Not on file  Tobacco Use   Smoking status: Never   Smokeless tobacco: Never  Vaping Use   Vaping Use: Never used  Substance and Sexual Activity   Alcohol use: No   Drug use: No   Sexual activity: Yes    Birth control/protection: None  Other Topics Concern   Not on file  Social History Narrative   Right handed   Social Determinants of Health   Financial Resource Strain: Not on file  Food Insecurity: Not on file  Transportation Needs: Not on file  Physical Activity: Not on file  Stress: Not on file  Social Connections: Not on file  Intimate Partner Violence: Not on file    Family History  Problem Relation Age  of Onset   Diabetes Mother     Past Surgical History:  Procedure Laterality Date   CHOLECYSTECTOMY N/A 06/14/2018   Procedure: LAPAROSCOPIC CHOLECYSTECTOMY;  Surgeon: [name], [name], MD;  Location: [facility];  Service: General;  Laterality: N/A;   TOOTH EXTRACTION      ROS: Review of Systems Negative except as stated above  PHYSICAL EXAM: BP 124/85    Pulse 81    Resp 16    Wt 179 lb (81.2 kg)    SpO2 96%    BMI 34.96 kg/m   Physical Exam  General appearance - alert, well appearing, and in no distress Mental status - normal mood, behavior, speech, dress, motor activity, and thought processes Abdomen -bowel sounds sluggish.  Abdomen is nondistended.  She has mild diffuse tenderness.  With slight guarding just above the umbilicus.   Midline scar below the umbilicus.   CMP Latest Ref Rng & Units 12/15/2019 06/14/2018 06/13/2018  Glucose 65 - 99 mg/dL 85 84 -  BUN 6 - 20 mg/dL 12 11 -  Creatinine 0.57 - 1.00 mg/dL 0.59 0.65 0.56  Sodium 134 - 144 mmol/L 142 141 -  Potassium 3.5 - 5.2 mmol/L 4.2 3.5 -  Chloride 96 - 106 mmol/L 107(H) 110 -  CO2 20 - 29 mmol/L 22 23 -  Calcium 8.7 - 10.2 mg/dL 8.6(L) 8.4(L) -  Total Protein 6.5 - 8.1 g/dL - 6.6 -  Total Bilirubin 0.3 - 1.2 mg/dL - 0.9 -  Alkaline Phos 38 - 126 U/L - 162(H) -  AST 15 - 41 U/L - 209(H) -  ALT 0 - 44 U/L - 253(H) -   Lipid Panel     Component Value Date/Time   CHOL 198 12/15/2019 1051   TRIG 159 (H) 12/15/2019 1051   HDL 37 (L) 12/15/2019 1051   CHOLHDL 5.4 (H) 12/15/2019 1051   LDLCALC 132 (H) 12/15/2019 1051    CBC    Component Value Date/Time   WBC 11.4 (H) 12/15/2019 1051   WBC 8.0 06/14/2018 0239   RBC 4.56 12/15/2019 1051   RBC 4.19 06/14/2018 0239   HGB 12.4 12/15/2019 1051   HCT 39.0 12/15/2019 1051   PLT 302 12/15/2019 1051   MCV 86 12/15/2019 1051   MCH 27.2 12/15/2019 1051   MCH 27.0 06/14/2018 0239   MCHC 31.8 12/15/2019 1051   MCHC 32.1 06/14/2018 0239   RDW 14.1 12/15/2019 1051    LYMPHSABS 2.5 12/15/2019 1051   MONOABS 0.5 07/01/2016 0948   EOSABS 0.2 12/15/2019 1051   BASOSABS 0.0 12/15/2019 1051    ASSESSMENT AND PLAN: 1. Generalized abdominal pain Differential diagnosis include gastritis versus pancreatitis versus other acute intra-abdominal pathology.  We will get CAT  scan of the abdomen to evaluate further. - CT Abdomen Pelvis W Contrast   [employer] Language interpreter used during this encounter. #[id number], Anne Hahn  Patient was given the opportunity to ask questions.  Patient verbalized understanding of the plan and was able to repeat key elements of the plan.   No orders of the defined types were placed in this encounter.    Requested Prescriptions    No prescriptions requested or ordered in this encounter    No follow-ups on file.  Karle Plumber, MD, FACP

## 2021-06-25 NOTE — Progress Notes (Unsigned)
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  Workup negative.   Start amtriptyline 10mg  at bedtime.  If no improvement in 4 weeks, we can increase to 25mg  at bedtime Follow up 6 months.  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   UPDATE: Started amitriptyline in August.  Currently taking 10mg  at bedtime ***    HISTORY:  She developed left sided facial paralysis at age 65.  Over the course of 3 days, she developed difficulty closing her left eye and moving the left side of her mouth.  Symptoms lasted about 4 months.  Overall, symptoms improved but she always had residual left sided facial paralysis.  While in [place], she did receive injections in her face, possibly acupuncture or dry needling, which was helpful.  She reports exacerbation of symptoms (sensation of facial swelling, pain and weakness) following the birth of her 3 children.  She currently experiences pain in the left side of her face.  She hadphysical therapy for cervical radiculopathy - right sided neck pain with numbness in the right hand (4th and 5th digits) and her therapist told her there may be therapy that they can offer.  MRI of brain and left facial nerve with and without contrast on 07/05/2020 showed asymmetric enhancement of the genu of the left facial nerve.  Serum labs on 07/10/2020 included negative ANA, negative SSA/SSB antibodies, negative Lyme antibody, ACE 16 and elevated sed rate 48.   Past medication:  gabapentin (side effects)  PAST MEDICAL HISTORY: Past Medical History:  Diagnosis Date   Anemia    Facial paralysis    c/o facial paralysis with last deliveries.  Unable to determine reason per pt.     Irregular periods     MEDICATIONS: Current Outpatient Medications on File Prior to Visit  Medication Sig  Dispense Refill   acetaminophen (TYLENOL) 500 MG tablet Take 2 tablets (1,000 mg total) by mouth every 6 (six) hours as needed. 30 tablet 0   amitriptyline (ELAVIL) 10 MG tablet Take 1 tablet (10 mg total) by mouth at bedtime. 30 tablet 5   celecoxib (CELEBREX) 200 MG capsule Take 1 capsule (200 mg total) by mouth daily. 30 capsule 3   Cholecalciferol (VITAMIN D-3) 125 MCG (5000 UT) TABS Take 1 tablet by mouth daily. 90 tablet 3   methocarbamol (ROBAXIN) 500 MG tablet Take 1 tablet (500 mg total) by mouth 2 (two) times daily as needed. 20 tablet 0   miconazole (MICOTIN) 2 % cream Apply to affected area on foot twice a day 28.35 g 0   mupirocin ointment (BACTROBAN) 2 % Apply 1 application topically 2 (two) times daily. Apply twice daily 22 g 0   omeprazole (PRILOSEC) 20 MG capsule Take 1 capsule (20 mg total) by mouth daily. 30 capsule 2   predniSONE (STERAPRED UNI-PAK 21 TAB) 10 MG (21) TBPK tablet Take as directed 21 tablet 0   Sunscreen SPF50 LOTN Apply 1 application topically in the morning, at noon, and at bedtime. 100 mL 0   tetracycline (SUMYCIN) 500 MG capsule Take 500 mg by mouth 2 (two) times daily.     traMADol (ULTRAM) 50 MG tablet Take 1 tablet (50 mg total) by mouth every 12 (twelve) hours as needed. 30 tablet 2   triamcinolone (KENALOG) 0.1 % Apply 1 application topically daily as needed. 30 g 0  Current Facility-Administered Medications on File Prior to Visit  Medication Dose Route Frequency Provider Last Rate Last Admin   polyethylene glycol powder (GLYCOLAX/MIRALAX) container 255 g  1 Container Oral Once Rasch, Artist Pais, NP        ALLERGIES: No Known Allergies  FAMILY HISTORY: Family History  Problem Relation Age of Onset   Diabetes Mother       Objective:  *** General: No acute distress.  Patient appears ***-groomed.   Head:  Normocephalic/atraumatic Eyes:  Fundi examined but not visualized Neck: supple, no paraspinal tenderness, full range of motion Heart:   Regular rate and rhythm Lungs:  Clear to auscultation bilaterally Back: No paraspinal tenderness Neurological Exam: alert and oriented to person, place, and time.  Speech fluent and not dysarthric, language intact.  CN II-XII intact. Bulk and tone normal, muscle strength 5/5 throughout.  Sensation to light touch intact.  Deep tendon reflexes 2+ throughout, toes downgoing.  Finger to nose testing intact.  Gait normal, Romberg negative.   Metta Clines, DO  CC: ***

## 2021-06-26 ENCOUNTER — Ambulatory Visit: Payer: No Typology Code available for payment source

## 2021-06-26 ENCOUNTER — Ambulatory Visit: Payer: Self-pay | Admitting: Neurology

## 2021-06-26 NOTE — Therapy (Incomplete)
OUTPATIENT PHYSICAL THERAPY TREATMENT NOTE   Patient Name: Kari Ortega MRN: [id number] DOB:April 07, 1981, 41 y.o., female Today's Date: 06/26/2021  PCP: Ladell Pier, MD REFERRING PROVIDER: Nathaniel Man    Past Medical History:  Diagnosis Date   Anemia    Facial paralysis    c/o facial paralysis with last deliveries.  Unable to determine reason per pt.     Irregular periods    Past Surgical History:  Procedure Laterality Date   CHOLECYSTECTOMY N/A 06/14/2018   Procedure: LAPAROSCOPIC CHOLECYSTECTOMY;  Surgeon: [name], [name], MD;  Location: [place];  Service: General;  Laterality: N/A;   TOOTH EXTRACTION     Patient Active Problem List   Diagnosis Date Noted   Bulge of lumbar disc without myelopathy 05/24/2021   Carpal tunnel syndrome of right wrist 11/10/2019   Symptomatic cholelithiasis 06/13/2018   Postpartum care and examination 04/29/2018   Urine frequency 04/29/2018   Normal labor 03/18/2018   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   BMI 30-39.9 09/21/2017   Obesity in pregnancy 09/21/2017   Language barrier 09/21/2017   Supervision of high risk pregnancy, antepartum 08/24/2017   AMA (advanced maternal age) multigravida 35+ 08/24/2017   HYPERTRIGLYCERIDEMIA 12/15/2006    REFERRING DIAG:  M54.50,G89.29 (ICD-10-CM) - Chronic low back pain, unspecified back pain laterality, unspecified whether sciatica present  THERAPY DIAG:  No diagnosis found.  PERTINENT HISTORY:  None  PRECAUTIONS:  None  SUBJECTIVE:  ***  Pain: Are you having pain? {yes/no:20286} NPRS: ***/10 Pain Location: *** Pain Frequency/Description: {PAIN DESCRIPTION:21022940}  Aggravating Factors: *** Relieving Factors: ***   OBJECTIVE:     PATIENT SURVEYS:  FOTO 47% function; 63% predicted   COGNITION:          Overall cognitive status: Within functional limits for tasks assessed                        SENSATION:          Light touch: Appears intact            POSTURE:  Medium body habitus; increased lumbar lordosis   PALPATION: TTP to lumbar paraspinals; R greater trochanter; L4/5 hypomobility   LUMBARAROM/PROM   A/PROM A/PROM  06/20/2021  Flexion Increased pain; motion reduced  Extension No pain; reduced motion   (Blank rows = not tested)     LUMBAR SPECIAL TESTS:  Straight leg raise test: Positive Right   FUNCTIONAL TESTS:  30 Second Sit to Stand: 5 reps   GAIT: Distance walked: 55ft Assistive device utilized: None Level of assistance: Complete Independence Comments: decreased trunk extension; slowed gait speed   TODAY'S TREATMENT  [facility] Adult PT Treatment:                                                DATE: 06/26/2021 Therapeutic Exercise: Bridge x 10  Modified thomas stretch x 60" R Supine piriformis stretch x 30" R  [facility] Adult PT Treatment:                                                DATE: 06/20/2021 Therapeutic Exercise: Bridge x 10  Modified thomas stretch x 60" R Supine piriformis stretch x  30" R   PATIENT EDUCATION:  Education details: eval findings, FOTO, HEP, POC Person educated: Patient Education method: Explanation, Demonstration, and Handouts Education comprehension: verbalized understanding and returned demonstration     HOME EXERCISE PROGRAM: Access Code: [email or web address] URL: [email or web address] Date: 06/20/2021 Prepared by: Octavio Manns   Exercises Supine Bridge - 1 x daily - 7 x weekly - 2 sets - 10 reps - 3 sec hold Supine Piriformis Stretch with Leg Straight - 1 x daily - 7 x weekly - 2 sets - 20 sec hold Hooklying Clamshell with Resistance - 1 x daily - 7 x weekly - 3 sets - 10 reps Modified Thomas Stretch - 1 x daily - 7 x weekly - 2 reps - 60 sec hold     ASSESSMENT: ***     GOALS: Goals reviewed with patient? No   SHORT TERM GOALS:   STG Name Target Date Goal status  1 Pt will be compliant and knowledgeable with initial HEP for improved comfort and  carryover Baseline: initial HEP given 07/11/2021 INITIAL  2 Pt will self report lower back pain no greater than 6/10 for improved comfort and functional ability Baseline: 9/10 at worst 07/11/2021 INITIAL    LONG TERM GOALS:    LTG Name Target Date Goal status  1 Pt will self report lower back pain no greater than 2/10 for improved comfort and functional ability Baseline: 9/10 at worst 08/15/2021 INITIAL  2 Pt will improve FOTO function score to no less than 63% as proxy for functional improvement Baseline: 47% function 08/15/2021 INITIAL  3 Pt will increase reps in 30 Second Sit to Stand by no less than 8 reps for improved functional mobility Baseline: 5 reps 08/15/2021 INITIAL  4 Pt will be able lift and deadlift 45# to improve functional ability to lift her children and perform childcare duties Baseline: unable 08/15/2021 INITIAL    PLAN: PT FREQUENCY: 1-2x/week   PT DURATION: 8 weeks   PLANNED INTERVENTIONS: Therapeutic exercises, Therapeutic activity, Neuro Muscular re-education, Balance training, Gait training, Patient/Family education, Joint mobilization, Aquatic Therapy, Dry Needling, Cryotherapy, Moist heat, Ultrasound, and Manual therapy   PLAN FOR NEXT SESSION: assess HEP response, hip flexor stretch, core and proximal hip strenghtening    Ward Chatters, PT 06/26/2021, 9:44 AM
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
## 2021-07-01 ENCOUNTER — Other Ambulatory Visit: Payer: Self-pay

## 2021-07-01 ENCOUNTER — Ambulatory Visit: Payer: Self-pay | Attending: Internal Medicine

## 2021-07-01 ENCOUNTER — Ambulatory Visit: Payer: No Typology Code available for payment source

## 2021-07-01 DIAGNOSIS — M545 Low back pain, unspecified: Secondary | ICD-10-CM | POA: Diagnosis not present

## 2021-07-01 DIAGNOSIS — G8929 Other chronic pain: Secondary | ICD-10-CM

## 2021-07-01 DIAGNOSIS — M6281 Muscle weakness (generalized): Secondary | ICD-10-CM

## 2021-07-01 NOTE — Therapy (Signed)
OUTPATIENT PHYSICAL THERAPY TREATMENT NOTE   Patient Name: Kari Ortega MRN: [id number] DOB:10-27-1980, 41 y.o., female Today's Date: 07/01/2021  PCP: Ladell Pier, MD REFERRING PROVIDER: Ladell Pier, MD   PT End of Session - 07/01/21 1655     Visit Number 2    Number of Visits 17    Date for PT Re-Evaluation 08/15/21    PT Start Time 7902    PT Stop Time 4097    PT Time Calculation (min) 43 min             Past Medical History:  Diagnosis Date   Anemia    Facial paralysis    c/o facial paralysis with last deliveries.  Unable to determine reason per pt.     Irregular periods    Past Surgical History:  Procedure Laterality Date   CHOLECYSTECTOMY N/A 06/14/2018   Procedure: LAPAROSCOPIC CHOLECYSTECTOMY;  Surgeon: [name], [name], MD;  Location: [place];  Service: General;  Laterality: N/A;   TOOTH EXTRACTION     Patient Active Problem List   Diagnosis Date Noted   Bulge of lumbar disc without myelopathy 05/24/2021   Carpal tunnel syndrome of right wrist 11/10/2019   Symptomatic cholelithiasis 06/13/2018   Postpartum care and examination 04/29/2018   Urine frequency 04/29/2018   Normal labor 03/18/2018   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   BMI 30-39.9 09/21/2017   Obesity in pregnancy 09/21/2017   Language barrier 09/21/2017   Supervision of high risk pregnancy, antepartum 08/24/2017   AMA (advanced maternal age) multigravida 35+ 08/24/2017   HYPERTRIGLYCERIDEMIA 12/15/2006    REFERRING DIAG:  M54.50,G89.29 (ICD-10-CM) - Chronic low back pain, unspecified back pain laterality, unspecified whether sciatica present  THERAPY DIAG:  Chronic bilateral low back pain, unspecified whether sciatica present  Muscle weakness (generalized)  PERTINENT HISTORY:  None  PRECAUTIONS:  None  SUBJECTIVE:  Pt presents to PT with reports of decreased lower back pain, does have some abdominal pain.   Pain: Are you having pain? Yes NPRS:  5/10 Pain Location: lower back Pain location: lower back, bilateral hips, R>L  PAIN TYPE: aching and burning Pain description: intermittent  Aggravating factors: prolonged sitting, walking,  Relieving factors: exercise, rest   OBJECTIVE:     PATIENT SURVEYS:  FOTO 47% function; 63% predicted   LUMBARAROM/PROM   A/PROM A/PROM  06/20/2021  Flexion Increased pain; motion reduced  Extension No pain; reduced motion   (Blank rows = not tested)     LUMBAR SPECIAL TESTS:  Straight leg raise test: Positive Right   FUNCTIONAL TESTS:  30 Second Sit to Stand: 5 reps   GAIT: Distance walked: 60ft Assistive device utilized: None Level of assistance: Complete Independence Comments: decreased trunk extension; slowed gait speed   TODAY'S TREATMENT  [facility] Adult PT Treatment:                                                DATE: 07/01/2021 Therapeutic Exercise: NuStep lvl 5 UE/LE x 4 min while taking subjective Modified thomas stretch x 60"  each Bridge 2x10 - 3" hold Supine bent knee fallout 2x10 GTB Supine pball rollout x 15  Supine piriformis stretch x 30"  each Manual Therapy: Manual stretching into bilateral hip ER Manual stretching of bilateral hamstrings IASTM via percussion device to R piriformis, R QL  [facility] Adult PT Treatment:  DATE: 06/20/2021 Therapeutic Exercise: Bridge x 10  Modified thomas stretch x 60" R Supine piriformis stretch x 30" R   PATIENT EDUCATION:  Education details: eval findings, FOTO, HEP, POC Person educated: Patient Education method: Explanation, Demonstration, and Handouts Education comprehension: verbalized understanding and returned demonstration     HOME EXERCISE PROGRAM: Access Code: [email or web address] URL: [email or web address] Date: 06/20/2021 Prepared by: Octavio Manns   Exercises Supine Bridge - 1 x daily - 7 x weekly - 2 sets - 10 reps - 3 sec hold Supine Piriformis Stretch with Leg  Straight - 1 x daily - 7 x weekly - 2 sets - 20 sec hold Hooklying Clamshell with Resistance - 1 x daily - 7 x weekly - 3 sets - 10 reps Modified Thomas Stretch - 1 x daily - 7 x weekly - 2 reps - 60 sec hold     ASSESSMENT: Pt was able to complete all prescribed exercises with no adverse effect. Noted improvement in symptoms post manual therapy interventions. Therapy today focused on increasing core and proximal hip strength. Will continue to progress this and work towards deadlift and other functional movements.      GOALS: Goals reviewed with patient? No   SHORT TERM GOALS:   STG Name Target Date Goal status  1 Pt will be compliant and knowledgeable with initial HEP for improved comfort and carryover Baseline: initial HEP given 07/11/2021 INITIAL  2 Pt will self report lower back pain no greater than 6/10 for improved comfort and functional ability Baseline: 9/10 at worst 07/11/2021 INITIAL    LONG TERM GOALS:    LTG Name Target Date Goal status  1 Pt will self report lower back pain no greater than 2/10 for improved comfort and functional ability Baseline: 9/10 at worst 08/15/2021 INITIAL  2 Pt will improve FOTO function score to no less than 63% as proxy for functional improvement Baseline: 47% function 08/15/2021 INITIAL  3 Pt will increase reps in 30 Second Sit to Stand by no less than 8 reps for improved functional mobility Baseline: 5 reps 08/15/2021 INITIAL  4 Pt will be able lift and deadlift 45# to improve functional ability to lift her children and perform childcare duties Baseline: unable 08/15/2021 INITIAL    PLAN: PT FREQUENCY: 1-2x/week   PT DURATION: 8 weeks   PLANNED INTERVENTIONS: Therapeutic exercises, Therapeutic activity, Neuro Muscular re-education, Balance training, Gait training, Patient/Family education, Joint mobilization, Aquatic Therapy, Dry Needling, Cryotherapy, Moist heat, Ultrasound, and Manual therapy   PLAN FOR NEXT SESSION: assess HEP response, hip  flexor stretch, core and proximal hip strenghtening    Ward Chatters, PT 07/01/2021, 5:43 PM

## 2021-07-03 ENCOUNTER — Ambulatory Visit: Payer: No Typology Code available for payment source

## 2021-07-03 ENCOUNTER — Other Ambulatory Visit: Payer: Self-pay

## 2021-07-03 DIAGNOSIS — M6281 Muscle weakness (generalized): Secondary | ICD-10-CM

## 2021-07-03 DIAGNOSIS — G8929 Other chronic pain: Secondary | ICD-10-CM

## 2021-07-03 DIAGNOSIS — M545 Low back pain, unspecified: Secondary | ICD-10-CM | POA: Diagnosis not present

## 2021-07-03 NOTE — Therapy (Signed)
OUTPATIENT PHYSICAL THERAPY TREATMENT NOTE   Patient Name: Kari Ortega MRN: [id number] DOB:1980-08-04, 41 y.o., female Today's Date: 07/03/2021  PCP: Ladell Pier, MD REFERRING PROVIDER: Aundra Dubin, PA-C   PT End of Session - 07/03/21 1701     Visit Number 3    Number of Visits 17    Date for PT Re-Evaluation 08/15/21    PT Start Time 1701    PT Stop Time 1739    PT Time Calculation (min) 38 min              Past Medical History:  Diagnosis Date   Anemia    Facial paralysis    c/o facial paralysis with last deliveries.  Unable to determine reason per pt.     Irregular periods    Past Surgical History:  Procedure Laterality Date   CHOLECYSTECTOMY N/A 06/14/2018   Procedure: LAPAROSCOPIC CHOLECYSTECTOMY;  Surgeon: [name], [name], MD;  Location: [place];  Service: General;  Laterality: N/A;   TOOTH EXTRACTION     Patient Active Problem List   Diagnosis Date Noted   Bulge of lumbar disc without myelopathy 05/24/2021   Carpal tunnel syndrome of right wrist 11/10/2019   Symptomatic cholelithiasis 06/13/2018   Postpartum care and examination 04/29/2018   Urine frequency 04/29/2018   Normal labor 03/18/2018   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   BMI 30-39.9 09/21/2017   Obesity in pregnancy 09/21/2017   Language barrier 09/21/2017   Supervision of high risk pregnancy, antepartum 08/24/2017   AMA (advanced maternal age) multigravida 35+ 08/24/2017   HYPERTRIGLYCERIDEMIA 12/15/2006    REFERRING DIAG:  M54.50,G89.29 (ICD-10-CM) - Chronic low back pain, unspecified back pain laterality, unspecified whether sciatica present  THERAPY DIAG:  Chronic bilateral low back pain, unspecified whether sciatica present  Muscle weakness (generalized)  PERTINENT HISTORY:  None  PRECAUTIONS:  None  SUBJECTIVE:  Interpreter: Wynetta Fines [phone]  Pt presents to PT with reports of continued improvement in lower back symptoms. Has remained compliant  with her HEP with no adverse effect. Pt is ready to begin PT at this time.   Pain: Are you having pain? Yes NPRS: 5/10 Pain Location: lower back Pain location: lower back, bilateral hips, R>L  PAIN TYPE: aching and burning Pain description: intermittent  Aggravating factors: prolonged sitting, walking,  Relieving factors: exercise, rest   OBJECTIVE:     PATIENT SURVEYS:  FOTO 47% function; 63% predicted   LUMBARAROM/PROM   A/PROM A/PROM  06/20/2021  Flexion Increased pain; motion reduced  Extension No pain; reduced motion   (Blank rows = not tested)     LUMBAR SPECIAL TESTS:  Straight leg raise test: Positive Right   FUNCTIONAL TESTS:  30 Second Sit to Stand: 5 reps   GAIT: Distance walked: 44ft Assistive device utilized: None Level of assistance: Complete Independence Comments: decreased trunk extension; slowed gait speed   TODAY'S TREATMENT  [facility] Adult PT Treatment:                                                DATE: 07/03/2021 Therapeutic Exercise: NuStep lvl 5 UE/LE x 5 min while taking subjective Modified thomas stretch x 60"  each S/L clamshell 2x15 GTB Bridge 2x10 - 3" hold w/ ball squeeze Modified side plank 2x15" each Lateral walk x 3 laps in // RTB Standing hip ext 2x10 RTB  STS x 15 - from elevated table Manual Therapy: Manual stretching into bilateral hip ER Manual stretching of bilateral hamstrings IASTM via percussion device to R piriformis, R QL  [facility] Adult PT Treatment:                                                DATE: 07/01/2021 Therapeutic Exercise: NuStep lvl 5 UE/LE x 4 min while taking subjective Modified thomas stretch x 60"  each Bridge 2x10 - 3" hold Supine bent knee fallout 2x10 GTB Supine pball rollout x 15  Supine piriformis stretch x 30"  each Manual Therapy: Manual stretching into bilateral hip ER Manual stretching of bilateral hamstrings IASTM via percussion device to R piriformis, R QL  [facility] Adult PT Treatment:                                                 DATE: 06/20/2021 Therapeutic Exercise: Bridge x 10  Modified thomas stretch x 60" R Supine piriformis stretch x 30" R   PATIENT EDUCATION:  Education details: eval findings, FOTO, HEP, POC Person educated: Patient Education method: Explanation, Demonstration, and Handouts Education comprehension: verbalized understanding and returned demonstration     HOME EXERCISE PROGRAM: Access Code: [email or web address] URL: [email or web address] Date: 06/20/2021 Prepared by: Octavio Manns   Exercises Supine Bridge - 1 x daily - 7 x weekly - 2 sets - 10 reps - 3 sec hold Supine Piriformis Stretch with Leg Straight - 1 x daily - 7 x weekly - 2 sets - 20 sec hold Hooklying Clamshell with Resistance - 1 x daily - 7 x weekly - 3 sets - 10 reps Modified Thomas Stretch - 1 x daily - 7 x weekly - 2 reps - 60 sec hold     ASSESSMENT: Pt was able to complete all prescribed exercises with no adverse effect or increase in pain. Therapy today focused on improving proximal hip and core strength in order to decrease lower back pain and improve mobility. She continues to benefit from skilled PT services and will continue to be seen and progress as tolerated.      GOALS: Goals reviewed with patient? No   SHORT TERM GOALS:   STG Name Target Date Goal status  1 Pt will be compliant and knowledgeable with initial HEP for improved comfort and carryover Baseline: initial HEP given 07/11/2021 INITIAL  2 Pt will self report lower back pain no greater than 6/10 for improved comfort and functional ability Baseline: 9/10 at worst 07/11/2021 INITIAL    LONG TERM GOALS:    LTG Name Target Date Goal status  1 Pt will self report lower back pain no greater than 2/10 for improved comfort and functional ability Baseline: 9/10 at worst 08/15/2021 INITIAL  2 Pt will improve FOTO function score to no less than 63% as proxy for functional improvement Baseline: 47% function 08/15/2021  INITIAL  3 Pt will increase reps in 30 Second Sit to Stand by no less than 8 reps for improved functional mobility Baseline: 5 reps 08/15/2021 INITIAL  4 Pt will be able lift and deadlift 45# to improve functional ability 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

## 2021-07-04 ENCOUNTER — Ambulatory Visit: Payer: Self-pay

## 2021-07-04 NOTE — Telephone Encounter (Signed)
°  Chief Complaint: abdominal apin Symptoms: pain and nausea Frequency: after eating Pertinent Negatives: Patient denies fever Disposition: [] ED /[x] Urgent Care (no appt availability in office) / [] Appointment(In office/virtual)/ []  [facility] [facility]/ [] Home Care/ [] Refused Recommended Disposition /[] [facility] [facility]/ [x]  Follow-up with PCP Additional Notes: This is the same problem that pt was seen for recently. Pain has increased 8-10/10. Per protocol pt should go to UC. Pt will go to UC tonight if needed, but would rather be seen in the office in the morning.    Reason for Disposition  [1] MILD-MODERATE pain AND [2] constant AND [3] present > 2 hours  Answer Assessment - Initial Assessment Questions 1. LOCATION: "Where does it hurt?"      Abdomen 2. RADIATION: "Does the pain shoot anywhere else?" (e.g., chest, back)     no 3. ONSET: "When did the pain begin?" (e.g., minutes, hours or days ago)      days 4. SUDDEN: "Gradual or sudden onset?"     gradual 5. PATTERN "Does the pain come and go, or is it constant?"    - If constant: "Is it getting better, staying the same, or worsening?"      (Note: Constant means the pain never goes away completely; most serious pain is constant and it progresses)     - If intermittent: "How long does it last?" "Do you have pain now?"     (Note: Intermittent means the pain goes away completely between bouts)     Comes and goes 6. SEVERITY: "How bad is the pain?"  (e.g., Scale 1-10; mild, moderate, or severe)    - MILD (1-3): doesn't interfere with normal activities, abdomen soft and not tender to touch     - MODERATE (4-7): interferes with normal activities or awakens from sleep, abdomen tender to touch     - SEVERE (8-10): excruciating pain, doubled over, unable to do any normal activities       8-10/10 7. RECURRENT SYMPTOM: "Have you ever had this type of stomach pain before?" If Yes, ask: "When was the last time?" and "What happened that  time?"      ongoing 8. AGGRAVATING FACTORS: "Does anything seem to cause this pain?" (e.g., foods, stress, alcohol)     eating 9. CARDIAC SYMPTOMS: "Do you have any of the following symptoms: chest pain, difficulty breathing, sweating, nausea?"     no 10. OTHER SYMPTOMS: "Do you have any other symptoms?" (e.g., back pain, diarrhea, fever, urination pain, vomiting)       nausea 11. PREGNANCY: "Is there any chance you are pregnant?" "When was your last menstrual period?"       no  Protocols used: Abdominal Pain - Upper-A-AH

## 2021-07-04 NOTE — Telephone Encounter (Signed)
Noted  

## 2021-07-05 ENCOUNTER — Other Ambulatory Visit: Payer: Self-pay

## 2021-07-05 ENCOUNTER — Encounter: Payer: Self-pay | Admitting: Nurse Practitioner

## 2021-07-05 ENCOUNTER — Ambulatory Visit: Payer: Self-pay | Attending: Nurse Practitioner | Admitting: Nurse Practitioner

## 2021-07-05 VITALS — BP 139/86 | HR 71 | Resp 20 | Ht 60.0 in | Wt 181.0 lb

## 2021-07-05 DIAGNOSIS — R7303 Prediabetes: Secondary | ICD-10-CM

## 2021-07-05 DIAGNOSIS — R1084 Generalized abdominal pain: Secondary | ICD-10-CM

## 2021-07-05 LAB — POCT URINALYSIS DIP (CLINITEK)
Bilirubin, UA: NEGATIVE
Glucose, UA: NEGATIVE mg/dL
Ketones, POC UA: NEGATIVE mg/dL
Leukocytes, UA: NEGATIVE
Nitrite, UA: NEGATIVE
POC PROTEIN,UA: 30 — AB
Spec Grav, UA: >=1.03 — AB (ref 1.010–1.025)
Urobilinogen, UA: 0.2 E.U./dL
pH, UA: 6.5 (ref 5.0–8.0)

## 2021-07-05 LAB — POCT URINE PREGNANCY: Preg Test, Ur: NEGATIVE

## 2021-07-05 NOTE — Progress Notes (Signed)
Assessment & Plan:  Kari Ortega was seen today for abdominal pain.  Diagnoses and all orders for this visit:  Generalized abdominal pain -     H. pylori breath test -     CMP14+EGFR -     CBC -     Lipid panel -     POCT URINALYSIS DIP (CLINITEK) -     POCT urine pregnancy IF work up negative. May need GI or pelvic US.   Prediabetes -     Hemoglobin A1c    Patient has been counseled on age-appropriate routine health concerns for screening and prevention. These are reviewed and up-to-date. Referrals have been placed accordingly. Immunizations are up-to-date or declined.    Subjective:   Chief Complaint  Patient presents with   Abdominal Pain   HPI Kari Ortega 41 y.o. female presents to office today she endorses persistent generalized abdominal pain. Last BM was today. She denies constipation or diarrhea. Pain is described as sharp. Associated symptoms bloating. She takes omeprazole. Pain radiates from the epigastric region to the entire abdomen.   VRI was used to communicate directly with patient for the entire encounter including providing detailed patient instructions.      Review of Systems  Constitutional:  Negative for fever, malaise/fatigue and weight loss.  HENT: Negative.  Negative for nosebleeds.   Eyes: Negative.  Negative for blurred vision, double vision and photophobia.  Respiratory: Negative.  Negative for cough and shortness of breath.   Cardiovascular: Negative.  Negative for chest pain, palpitations and leg swelling.  Gastrointestinal:  Positive for abdominal pain. Negative for blood in stool, constipation, diarrhea, heartburn, melena, nausea and vomiting.  Musculoskeletal: Negative.  Negative for myalgias.  Neurological: Negative.  Negative for dizziness, focal weakness, seizures and headaches.  Psychiatric/Behavioral: Negative.  Negative for suicidal ideas.    Past Medical History:  Diagnosis Date   Anemia    Facial paralysis    c/o  facial paralysis with last deliveries.  Unable to determine reason per pt.     Irregular periods     Past Surgical History:  Procedure Laterality Date   CHOLECYSTECTOMY N/A 06/14/2018   Procedure: LAPAROSCOPIC CHOLECYSTECTOMY;  Surgeon: [name], [name], MD;  Location: [place];  Service: General;  Laterality: N/A;   TOOTH EXTRACTION      Family History  Problem Relation Age of Onset   Diabetes Mother     Social History Reviewed with no changes to be made today.   Outpatient Medications Prior to Visit  Medication Sig Dispense Refill   acetaminophen (TYLENOL) 500 MG tablet Take 2 tablets (1,000 mg total) by mouth every 6 (six) hours as needed. 30 tablet 0   amitriptyline (ELAVIL) 10 MG tablet Take 1 tablet (10 mg total) by mouth at bedtime. 30 tablet 5   celecoxib (CELEBREX) 200 MG capsule Take 1 capsule (200 mg total) by mouth daily. 30 capsule 3   Cholecalciferol (VITAMIN D-3) 125 MCG (5000 UT) TABS Take 1 tablet by mouth daily. 90 tablet 3   methocarbamol (ROBAXIN) 500 MG tablet Take 1 tablet (500 mg total) by mouth 2 (two) times daily as needed. 20 tablet 0   miconazole (MICOTIN) 2 % cream Apply to affected area on foot twice a day 28.35 g 0   mupirocin ointment (BACTROBAN) 2 % Apply 1 application topically 2 (two) times daily. Apply twice daily 22 g 0   omeprazole (PRILOSEC) 20 MG capsule Take 1 capsule (20 mg total) by mouth daily. 30 capsule  2   predniSONE (STERAPRED UNI-PAK 21 TAB) 10 MG (21) TBPK tablet Take as directed 21 tablet 0   Sunscreen SPF50 LOTN Apply 1 application topically in the morning, at noon, and at bedtime. 100 mL 0   tetracycline (SUMYCIN) 500 MG capsule Take 500 mg by mouth 2 (two) times daily.     traMADol (ULTRAM) 50 MG tablet Take 1 tablet (50 mg total) by mouth every 12 (twelve) hours as needed. 30 tablet 2   triamcinolone (KENALOG) 0.1 % Apply 1 application topically daily as needed. 30 g 0   Facility-Administered Medications Prior to Visit   Medication Dose Route Frequency Provider Last Rate Last Admin   polyethylene glycol powder (GLYCOLAX/MIRALAX) container 255 g  1 Container Oral Once Rasch, Anderson Malta I, NP        No Known Allergies     Objective:    BP 139/86    Pulse 71    Resp 20    Ht 5' (1.524 m)    Wt 181 lb (82.1 kg)    LMP 07/02/2021    SpO2 99%    Breastfeeding Yes    BMI 35.35 kg/m  Wt Readings from Last 3 Encounters:  07/05/21 181 lb (82.1 kg)  06/21/21 179 lb (81.2 kg)  05/21/21 175 lb (79.4 kg)    Physical Exam Vitals and nursing note reviewed.  Constitutional:      Appearance: She is well-developed.  HENT:     Head: Normocephalic and atraumatic.  Cardiovascular:     Rate and Rhythm: Normal rate and regular rhythm.     Heart sounds: Normal heart sounds. No murmur heard.   No friction rub. No gallop.  Pulmonary:     Effort: Pulmonary effort is normal. No tachypnea or respiratory distress.     Breath sounds: Normal breath sounds. No decreased breath sounds, wheezing, rhonchi or rales.  Chest:     Chest wall: No tenderness.  Abdominal:     General: Bowel sounds are normal.     Palpations: Abdomen is soft.     Tenderness: There is abdominal tenderness in the suprapubic area. There is no right CVA tenderness or left CVA tenderness.  Musculoskeletal:        General: Normal range of motion.     Cervical back: Normal range of motion.  Skin:    General: Skin is warm and dry.  Neurological:     Mental Status: She is alert and oriented to person, place, and time.     Coordination: Coordination normal.  Psychiatric:        Behavior: Behavior normal. Behavior is cooperative.        Thought Content: Thought content normal.        Judgment: Judgment normal.         Patient has been counseled extensively about nutrition and exercise as well as the importance of adherence with medications and regular follow-up. The patient was given clear instructions to go to ER or return to medical center if  symptoms don't improve, worsen or new problems develop. The patient verbalized understanding.   Follow-up: Return for follow up with [facility] for abdominal pain.   Gildardo Pounds, FNP-BC [facility], [place]   07/05/2021, 11:14 AM

## 2021-07-06 LAB — CMP14+EGFR
ALT: 34 IU/L — ABNORMAL HIGH (ref 0–32)
AST: 30 IU/L (ref 0–40)
Albumin/Globulin Ratio: 1.5 (ref 1.2–2.2)
Albumin: 4.1 g/dL (ref 3.8–4.8)
Alkaline Phosphatase: 113 IU/L (ref 44–121)
BUN/Creatinine Ratio: 24 — ABNORMAL HIGH (ref 9–23)
BUN: 14 mg/dL (ref 6–24)
Bilirubin Total: 0.2 mg/dL (ref 0.0–1.2)
CO2: 24 mmol/L (ref 20–29)
Calcium: 8.7 mg/dL (ref 8.7–10.2)
Chloride: 103 mmol/L (ref 96–106)
Creatinine, Ser: 0.59 mg/dL (ref 0.57–1.00)
Globulin, Total: 2.7 g/dL (ref 1.5–4.5)
Glucose: 95 mg/dL (ref 70–99)
Potassium: 4 mmol/L (ref 3.5–5.2)
Sodium: 139 mmol/L (ref 134–144)
Total Protein: 6.8 g/dL (ref 6.0–8.5)
eGFR: 117 mL/min/{1.73_m2} (ref >59)

## 2021-07-06 LAB — LIPID PANEL
Chol/HDL Ratio: 5.7 ratio — ABNORMAL HIGH (ref 0.0–4.4)
Cholesterol, Total: 160 mg/dL (ref 100–199)
HDL: 28 mg/dL — ABNORMAL LOW (ref >39)
LDL Chol Calc (NIH): 91 mg/dL (ref 0–99)
Triglycerides: 239 mg/dL — ABNORMAL HIGH (ref 0–149)
VLDL Cholesterol Cal: 41 mg/dL — ABNORMAL HIGH (ref 5–40)

## 2021-07-06 LAB — HEMOGLOBIN A1C
Est. average glucose Bld gHb Est-mCnc: 114 mg/dL
Hgb A1c MFr Bld: 5.6 % (ref 4.8–5.6)

## 2021-07-06 LAB — CBC
Hematocrit: 35.7 % (ref 34.0–46.6)
Hemoglobin: 11.1 g/dL (ref 11.1–15.9)
MCH: 25.1 pg — ABNORMAL LOW (ref 26.6–33.0)
MCHC: 31.1 g/dL — ABNORMAL LOW (ref 31.5–35.7)
MCV: 81 fL (ref 79–97)
Platelets: 299 10*3/uL (ref 150–450)
RBC: 4.43 x10E6/uL (ref 3.77–5.28)
RDW: 14.6 % (ref 11.7–15.4)
WBC: 9 10*3/uL (ref 3.4–10.8)

## 2021-07-06 LAB — H. PYLORI BREATH TEST: H pylori Breath Test: POSITIVE — AB

## 2021-07-08 ENCOUNTER — Ambulatory Visit: Payer: No Typology Code available for payment source

## 2021-07-08 ENCOUNTER — Other Ambulatory Visit: Payer: Self-pay

## 2021-07-08 DIAGNOSIS — M545 Low back pain, unspecified: Secondary | ICD-10-CM | POA: Diagnosis not present

## 2021-07-08 DIAGNOSIS — M6281 Muscle weakness (generalized): Secondary | ICD-10-CM

## 2021-07-08 NOTE — Therapy (Signed)
OUTPATIENT PHYSICAL THERAPY TREATMENT NOTE   Patient Name: Kari Ortega MRN: [id number] DOB:[birth year], 41 y.o., female Today's Date: 07/08/2021  PCP: Ladell Pier, MD REFERRING PROVIDER: Ladell Pier, MD      Past Medical History:  Diagnosis Date   Anemia    Facial paralysis    c/o facial paralysis with last deliveries.  Unable to determine reason per pt.     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  PERTINENT HISTORY:  None  PRECAUTIONS:  None  SUBJECTIVE:  Interpreter: Eddie Dibbles [phone]  Reporting she is a little better.  Noting some low back pain when bending.  Improved when she sits or refrains from bending  Pain: Are you having pain? Yes NPRS: 4/10 Pain Location: lower back Pain location: lower back, bilateral hips, R>L  PAIN TYPE: aching and burning Pain description: intermittent  Aggravating factors: prolonged sitting, walking,  Relieving factors: exercise,  rest   OBJECTIVE:     PATIENT SURVEYS:  FOTO 47% function; 63% predicted   LUMBARAROM/PROM   A/PROM A/PROM  06/20/2021  Flexion Increased pain; motion reduced  Extension No pain; reduced motion   (Blank rows = not tested)     LUMBAR SPECIAL TESTS:  Straight leg raise test: Positive Right   FUNCTIONAL TESTS:  30 Second Sit to Stand: 5 reps   GAIT: Distance walked: 4ft Assistive device utilized: None Level of assistance: Complete Independence Comments: decreased trunk extension; slowed gait speed   TODAY'S TREATMENT   [facility] Adult PT Treatment:                                                DATE: 07/08/21 Therapeutic Exercise: NuStep lvl 4 UE/LE x 8  Modified thomas stretch 30s x2 B S/L clamshell 1x15 GTB Bridge 2x15 - 3" hold w/ ball squeeze STS x 15 - from elevated table with OH reach/outstretched arms for postural emphasis Supine hip fallouts GTB 15x Figure 4 piriformis stretch 30s x2 B Manual Therapy: R piriformis release in L side lie 8 min  [facility] Adult PT Treatment:                                                DATE: 07/03/2021 Therapeutic Exercise: NuStep lvl 5 UE/LE x 5 min while taking subjective  Modified thomas stretch x 60"  each S/L clamshell 2x15 GTB Bridge 2x10 - 3" hold w/ ball squeeze Modified side plank 2x15" each Lateral walk x 3 laps in // RTB Standing hip ext 2x10 RTB STS x 15 - from elevated table Figure 4 piriformis stretch 30s x2 B Manual Therapy: Manual stretching into bilateral hip ER Manual stretching of bilateral hamstrings IASTM via percussion device to R piriformis, R QL  [facility] Adult PT Treatment:                                                DATE: 07/01/2021 Therapeutic Exercise: NuStep lvl 5 UE/LE x 4 min while taking subjective Modified thomas stretch x 60"  each Bridge 2x10 - 3" hold Supine bent knee fallout 2x10 GTB Supine pball rollout x 15  Supine piriformis stretch x 30"  each Manual Therapy: Manual stretching into  bilateral hip ER Manual stretching of bilateral hamstrings IASTM via percussion device to R piriformis, R QL  [facility] Adult PT Treatment:                                                DATE: 06/20/2021 Therapeutic Exercise: Bridge x 10  Modified thomas stretch x 60" R Supine piriformis stretch x 30" R   PATIENT EDUCATION:  Education details: eval findings, FOTO, HEP, POC Person educated: Patient Education method: Explanation, Demonstration, and Handouts Education comprehension: verbalized understanding and returned demonstration     HOME EXERCISE PROGRAM: Access Code: [email or web address] URL: [email or web address] Date: 06/20/2021 Prepared by: Octavio Manns   Exercises Supine Bridge - 1 x daily - 7 x weekly - 2 sets - 10 reps - 3 sec hold Supine Piriformis Stretch with Leg Straight - 1 x daily - 7 x weekly - 2 sets - 20 sec hold Hooklying Clamshell with Resistance - 1 x daily - 7 x weekly - 3 sets - 10 reps Modified Thomas Stretch - 1 x daily - 7 x weekly - 2 reps - 60 sec hold     ASSESSMENT: Continued core and trunk strengthening as well as B hip strength/stretch tasks. Added additional hip and piriformis stretching with focus on R.  Fatigued at end of session with some mild low back discomfort reported following STS activity     GOALS: Goals reviewed with patient? No   SHORT TERM GOALS:   STG Name Target Date Goal status  1 Pt will be compliant and knowledgeable with initial HEP for improved comfort and carryover Baseline: initial HEP given 07/11/2021 INITIAL  2 Pt will self report lower back pain no greater than 6/10 for improved comfort and functional ability Baseline: 9/10 at worst 07/11/2021 INITIAL    LONG TERM GOALS:    LTG Name Target Date Goal status  1 Pt will self report lower back pain no greater than 2/10 for improved comfort and functional ability Baseline: 9/10 at worst 08/15/2021 INITIAL  2 Pt will improve FOTO function score to no less than 63% as proxy for  functional improvement Baseline: 47% function 08/15/2021 INITIAL  3 Pt will increase reps in 30 Second Sit to Stand by no less than 8 reps for improved functional mobility Baseline: 5 reps 08/15/2021 INITIAL  4 Pt will be  able lift and deadlift 45# to improve functional ability to lift her children and perform childcare duties Baseline: unable 08/15/2021 INITIAL    PLAN: PT FREQUENCY: 1-2x/week   PT DURATION: 8 weeks   PLANNED INTERVENTIONS: Therapeutic exercises, Therapeutic activity, Neuro Muscular re-education, Balance training, Gait training, Patient/Family education, Joint mobilization, Aquatic Therapy, Dry Needling, Cryotherapy, Moist heat, Ultrasound, and Manual therapy   PLAN FOR NEXT SESSION: assess HEP response, hip flexor stretch, core and proximal hip strengthening, continue B hip stretch and strength, postural control with functional tasks    Lanice Shirts, PT 07/08/2021, 4:57 PM

## 2021-07-10 ENCOUNTER — Ambulatory Visit: Payer: Self-pay | Attending: Physician Assistant

## 2021-07-10 ENCOUNTER — Other Ambulatory Visit: Payer: Self-pay

## 2021-07-10 DIAGNOSIS — M6281 Muscle weakness (generalized): Secondary | ICD-10-CM | POA: Diagnosis present

## 2021-07-10 DIAGNOSIS — R2689 Other abnormalities of gait and mobility: Secondary | ICD-10-CM | POA: Diagnosis present

## 2021-07-10 DIAGNOSIS — M5412 Radiculopathy, cervical region: Secondary | ICD-10-CM | POA: Diagnosis present

## 2021-07-10 DIAGNOSIS — M545 Low back pain, unspecified: Secondary | ICD-10-CM | POA: Insufficient documentation

## 2021-07-10 DIAGNOSIS — M79671 Pain in right foot: Secondary | ICD-10-CM | POA: Insufficient documentation

## 2021-07-10 DIAGNOSIS — G8929 Other chronic pain: Secondary | ICD-10-CM | POA: Insufficient documentation

## 2021-07-10 NOTE — Therapy (Signed)
?OUTPATIENT PHYSICAL THERAPY TREATMENT NOTE ? ? ?Patient Name: Kari Ortega ?MRN: [id number] ?DOB:04/27/1981, 41 y.o., female ?Today's Date: 07/10/2021 ? ?PCP: Ladell Pier, MD ?REFERRING PROVIDER: Aundra Dubin, PA-C ? ? PT End of Session - 07/10/21 1655   ? ? Visit Number 5   ? Number of Visits 17   ? Date for PT Re-Evaluation 08/15/21   ? Authorization Type Med Pay   ? PT Start Time 1656   ? PT Stop Time 1736   ? PT Time Calculation (min) 40 min   ? Activity Tolerance Patient tolerated treatment well;No increased pain   ? Behavior During Therapy [facility] for tasks assessed/performed   ? ?  ?  ? ?  ? ? ? ? ?Past Medical History:  ?Diagnosis Date  ? Anemia   ? Facial paralysis   ? 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  Unable to determine reason per pt.    ? Irregular periods   ? ?Past Surgical History:  ?Procedure Laterality Date  ? CHOLECYSTECTOMY N/A 06/14/2018  ? Procedure: LAPAROSCOPIC CHOLECYSTECTOMY;  Surgeon: [name], [name], MD;  Location: [facility];  Service: General;  Laterality: N/A;  ? TOOTH EXTRACTION    ? ?Patient Active Problem List  ? Diagnosis Date Noted  ? Bulge of lumbar disc without myelopathy 05/24/2021  ? Carpal tunnel syndrome of right wrist 11/10/2019  ? Symptomatic cholelithiasis 06/13/2018  ? Postpartum care and examination 04/29/2018  ? Urine frequency 04/29/2018  ? Normal labor 03/18/2018  ? 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 BMI 30-39.9 09/21/2017  ? Obesity in pregnancy 09/21/2017  ? Language barrier 09/21/2017  ? Supervision of high risk pregnancy, antepartum 08/24/2017  ? AMA (advanced maternal age) multigravida 35+ 08/24/2017  ? HYPERTRIGLYCERIDEMIA 12/15/2006  ? ? ?REFERRING DIAG:  ?M54.50,G89.29 (ICD-10-CM) - Chronic low back pain, unspecified back pain laterality, unspecified whether sciatica present ? ?THERAPY DIAG:  ?Chronic bilateral low back pain, unspecified whether sciatica present ? ?Muscle weakness (generalized) ? ?PERTINENT HISTORY:   ?None ? ?PRECAUTIONS:  ?None ? ?SUBJECTIVE:  ?Pt presents to PT with reports of decreased lower back pain. Has been compliant with HEP with no adverse effect. Pt is ready to begin PT at this time.  ? ?Pain: ?Are you having pain? Yes ?NPRS: 4/10 ?Pain Location: lower back ?Pain location: lower back, bilateral hips, R>L  ?PAIN TYPE: aching and burning ?Pain description: intermittent  ?Aggravating factors: prolonged sitting, walking,  ?Relieving factors: exercise, rest ? ? ?OBJECTIVE:  ?   ?PATIENT SURVEYS:  ?FOTO 47% function; 63% predicted ?  ?LUMBARAROM/PROM ?  ?A/PROM A/PROM  ?06/20/2021  ?Flexion Increased pain; motion reduced  ?Extension No pain; reduced motion  ? (Blank rows = not tested) ?  ?  ?LUMBAR SPECIAL TESTS:  ?Straight leg raise test: Positive Right ?  ?FUNCTIONAL TESTS:  ?30 Second Sit to Stand: 5 reps ?  ?GAIT: ?Distance walked: 103f ?Assistive device utilized: None ?Level of assistance: Complete Independence ?Comments: decreased trunk extension; slowed gait speed ?  ?TOD[facility]'S TREATMENT  ? [facility]                                                [facility] ?Therapeutic Exercise: ?NuStep lvl 4 UE/LE x 8  ?Modified thomas stretch 30s x2 B ?S/L clamshell 2x15 GTB ?Bridge 2x15 - 3" hold w/ ball  squeeze ?STS 2x10 holding 5# KB - from elevated table with OH reach/outstretched arms for postural emphasis ?Modified side planks 2x15" ?Paloff press 2x10 7# each way ?Manual Therapy: ?Trigger point release to R piriformis ?Manual stretch to R hip ER/ER ? ?[facility] Adult PT Treatment:                                                DATE: 07/08/21 ?Therapeutic Exercise: ?NuStep lvl 4 UE/LE x 8  ?Modified thomas stretch 30s x2 B ?S/L clamshell 1x15 GTB ?Bridge 2x15 - 3" hold w/ ball squeeze ?STS 2x10 holding 5# KB - from elevated table with OH reach/outstretched arms for postural emphasis ?Supine hip fallouts GTB 15x ?Figure 4 piriformis stretch 30s x2 B ?Manual Therapy: ?R piriformis release in L side lie  8 min ? ?[facility] Adult PT Treatment:                                                DATE: 07/03/2021 ?Therapeutic Exercise: ?NuStep lvl 5 UE/LE x 5 min while taking subjective ?Modified thomas stretch x 60"  each ?S/L clamshell 2x15 GTB ?Bridge 2x10 - 3" hold w/ ball squeeze ?Modified side plank 2x15" each ?Lateral walk x 3 laps in // RTB ?Standing hip ext 2x10 RTB ?STS x 15 - from elevated table ?Figure 4 piriformis stretch 30s x2 B ?Manual Therapy: ?Manual stretching into bilateral hip ER ?Manual stretching of bilateral hamstrings ?IASTM via percussion device to R piriformis, R QL ? ?PATIENT EDUCATION:  ?Education details: eval findings, FOTO, HEP, POC ?Person educated: Patient ?Education method: Explanation, Demonstration, and Handouts ?Education comprehension: verbalized understanding and returned demonstration ?  ?  ?HOME EXERCISE PROGRAM: ?Access Code: [email or web address] ?URL: [email or web address] ?Date: 06/20/2021 ?Prepared by: Octavio Manns ?  ?Exercises ?Supine Bridge - 1 x daily - 7 x weekly - 2 sets - 10 reps - 3 sec hold ?Supine Piriformis Stretch with Leg Straight - 1 x daily - 7 x weekly - 2 sets - 20 sec hold ?Hooklying Clamshell with Resistance - 1 x daily - 7 x weekly - 3 sets - 10 reps ?Modified Thomas Stretch - 1 x daily - 7 x weekly - 2 reps - 60 sec hold ?  ?  ?ASSESSMENT:  ?Pt was able to complete all prescribed exercises with progressions today with no adverse effect. Therapy today focused on improving core and proximal hip muscle strength in order to decrease pain and improve function. Will continue to progress exercises as tolerated per POC.  ?  ?  ?GOALS: ?Goals reviewed with patient? No ?  ?SHORT TERM GOALS: ?  ?STG Name Target Date Goal status  ?1 Pt will be compliant and knowledgeable with initial HEP for improved comfort and carryover ?Baseline: initial HEP given 07/11/2021 MET  ?2 Pt will self report lower back pain no greater than 6/10 for improved comfort and functional  ability ?Baseline: 9/10 at worst 07/11/2021 MET  ?  ?LONG TERM GOALS:  ?  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  ?PLAN: ?PT FREQUENCY: 1-2x/week ?  ?PT DURATION: 8 weeks ?  ?PLANNED INTERVENTIONS: Therapeutic exercises, Therapeutic activity, Neuro Muscular re-education, Balance training, Gait training, Patient/Family education, Joint mobilization, Aquatic Therapy, Dry Needling, Cryotherapy, Moist heat, Ultrasound, and Manual therapy ?  ?PLAN FOR NEXT SESSION: assess HEP response, hip flexor stretch, core and proximal hip strengthening, continue B hip stretch and strength, postural control with functional tasks ? ? ? ?Ward Chatters, PT ?07/10/2021, 5:40 PM ? ?  ? ?

## 2021-07-11 ENCOUNTER — Encounter: Payer: Self-pay | Admitting: Physician Assistant

## 2021-07-11 ENCOUNTER — Ambulatory Visit: Payer: Self-pay | Admitting: *Deleted

## 2021-07-11 ENCOUNTER — Emergency Department (HOSPITAL_COMMUNITY)
Admission: EM | Admit: 2021-07-11 | Discharge: 2021-07-12 | Disposition: A | Discharge status: Home / Self Care | Payer: Self-pay | Attending: Emergency Medicine | Admitting: Emergency Medicine

## 2021-07-11 ENCOUNTER — Ambulatory Visit: Payer: Self-pay | Attending: Physician Assistant | Admitting: Physician Assistant

## 2021-07-11 ENCOUNTER — Other Ambulatory Visit: Payer: Self-pay

## 2021-07-11 ENCOUNTER — Telehealth: Payer: Self-pay | Admitting: Internal Medicine

## 2021-07-11 ENCOUNTER — Emergency Department (HOSPITAL_COMMUNITY): Payer: Self-pay

## 2021-07-11 VITALS — BP 124/82 | HR 86 | Wt 181.6 lb

## 2021-07-11 DIAGNOSIS — G510 Bell's palsy: Secondary | ICD-10-CM

## 2021-07-11 DIAGNOSIS — B0239 Other herpes zoster eye disease: Secondary | ICD-10-CM

## 2021-07-11 DIAGNOSIS — N9489 Other specified conditions associated with female genital organs and menstrual cycle: Secondary | ICD-10-CM | POA: Insufficient documentation

## 2021-07-11 DIAGNOSIS — L03213 Periorbital cellulitis: Secondary | ICD-10-CM | POA: Insufficient documentation

## 2021-07-11 LAB — I-STAT CHEM 8, ED
BUN: 18 mg/dL (ref 6–20)
Calcium, Ion: 1.21 mmol/L (ref 1.15–1.40)
Chloride: 104 mmol/L (ref 98–111)
Creatinine, Ser: 0.7 mg/dL (ref 0.44–1.00)
Glucose, Bld: 117 mg/dL — ABNORMAL HIGH (ref 70–99)
HCT: 33 % — ABNORMAL LOW (ref 36.0–46.0)
Hemoglobin: 11.2 g/dL — ABNORMAL LOW (ref 12.0–15.0)
Potassium: 3.5 mmol/L (ref 3.5–5.1)
Sodium: 141 mmol/L (ref 135–145)
TCO2: 28 mmol/L (ref 22–32)

## 2021-07-11 LAB — URINALYSIS, ROUTINE W REFLEX MICROSCOPIC
Bilirubin Urine: NEGATIVE
Glucose, UA: NEGATIVE mg/dL
Hgb urine dipstick: NEGATIVE
Ketones, ur: NEGATIVE mg/dL
Nitrite: NEGATIVE
Protein, ur: NEGATIVE mg/dL
Specific Gravity, Urine: 1.014 (ref 1.005–1.030)
pH: 6 (ref 5.0–8.0)

## 2021-07-11 LAB — I-STAT BETA HCG BLOOD, ED (MC, WL, AP ONLY): I-stat hCG, quantitative: <5 m[IU]/mL (ref <5)

## 2021-07-11 MED ORDER — PREDNISONE 10 MG PO TABS
ORAL_TABLET | ORAL | 0 refills | Status: DC
  Filled 2021-07-11: qty 21, 6d supply, fill #0

## 2021-07-11 MED ORDER — VALACYCLOVIR HCL 1 G PO TABS
1000.0000 mg | ORAL_TABLET | Freq: Two times a day (BID) | ORAL | 0 refills | Status: AC
  Filled 2021-07-11: qty 20, 10d supply, fill #0

## 2021-07-11 NOTE — Telephone Encounter (Signed)
[facility] needs office visit notes faxed to their office at ?[phone]. ? ?Cb [phone] ? ?Pt never showed up at their facility.  Pt states she did not know anything about going to their office for appt. ?They spoke to pt and she was at home. ?

## 2021-07-11 NOTE — ED Provider Triage Note (Addendum)
Emergency Medicine Provider Triage Evaluation Note ? ?Kari Ortega , a 41 y.o. female  was evaluated in triage.  Pt complains of left eye swelling and pain, with left sided facial numbness since yesterday.  No hx of stroke.  No recent illness. ? ?Review of Systems  ?Positive: Eyelid edema, facial paralysis ?Negative: Headache, vision changes ? ?Physical Exam  ?BP 136/67 (BP Location: Right Arm)   Pulse 84   Temp 98.6 ?F (37 ?C) (Oral)   Resp 14   LMP 07/02/2021   SpO2 97%  ?Gen:   Awake, no distress   ?Resp:  Normal effort  ?MSK:   Moves extremities without difficulty  ?Other:  Facial asymmetry, eyelid edema and TTP ? ?Medical Decision Making  ?Medically screening exam initiated at 5:51 PM.  Appropriate orders placed.  Kari Ortega was informed that the remainder of the evaluation will be completed by another provider, this initial triage assessment does not replace that evaluation, and the importance of remaining in the ED until their evaluation is complete. ? ?Labs and imaging ordered ?  ?Prince Rome, PA-C ?02/08/56 1808 ? ?Translation service used Kari Ortega # [phone] ?  ?Prince Rome, PA-C ?70/96/43 1809 ? ?

## 2021-07-11 NOTE — Telephone Encounter (Signed)
Forwarding to Dr. Durenda Age nurse.  ?

## 2021-07-11 NOTE — Progress Notes (Signed)
Patient ID: Kari Ortega, female   DOB: [birth year], 41 y.o.   MRN: [id number] ? ? ? ? ?Kari Ortega, is a 41 y.o. female ? ?GHW:[id number] ? ?[id number]:[id number] ? ?DOB - [birth year] ? ?Chief Complaint  ?Patient presents with  ? Belepharitis  ?    ? ?Subjective:  ? ?Kari Ortega is a 41 y.o. female here today Seen by Geryl Rankins 07/05/2021 for generalized abdominal pain and prescribed miralax. ? ?L eye swollen.  Yesterday and day before eye was itchy and burning.  She did use some eye ointment.  She denies previous reaction.  Now L side of face is burning and L eye is painful  +burning/stinging/and itching. She has a h/o facial paralysis and this has not changed or increased.    ? ?[place] with [facility] ? ?[facility] [address] [place] ,[place]   ? ?Patient has No headache, No chest pain, No abdominal pain - No Nausea, No new weakness tingling or numbness, No Cough - SOB. ? ?No problems updated. ? ?ALLERGIES: ?No Known Allergies ? ?PAST MEDICAL HISTORY: ?Past Medical History:  ?Diagnosis Date  ? Anemia   ? Facial paralysis   ? 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  Unable to determine reason per pt.    ? Irregular periods   ? ? ?MEDICATIONS AT HOME: ?Prior to Admission medications   ?Medication Sig Start Date End Date Taking? Authorizing Provider  ?valACYclovir (VALTREX) 1000 MG tablet Take 1 tablet (1,000 mg total) by mouth 2 (two) times daily for 10 days. 07/11/21 07/21/21 Yes Argentina Donovan, PA-C  ?acetaminophen (TYLENOL) 500 MG tablet Take 2 tablets (1,000 mg total) by mouth every 6 (six) hours as needed. ?Patient not taking: Reported on 07/11/2021 06/14/18   Saverio Danker, PA-C  ?amitriptyline (ELAVIL) 10 MG tablet Take 1 tablet (10 mg total) by mouth at bedtime. ?Patient not taking: Reported on 07/11/2021 12/12/20   Pieter Partridge, DO  ?celecoxib (CELEBREX) 200 MG capsule Take 1 capsule (200 mg total) by mouth daily. ?Patient not taking: Reported on 07/11/2021 03/19/21    Ladell Pier, MD  ?Cholecalciferol (VITAMIN D-3) 125 MCG (5000 UT) TABS Take 1 tablet by mouth daily. ?Patient not taking: Reported on 07/11/2021 03/18/19   Hilts, Legrand Como, MD  ?methocarbamol (ROBAXIN) 500 MG tablet Take 1 tablet (500 mg total) by mouth 2 (two) times daily as needed. ?Patient not taking: Reported on 07/11/2021 01/03/21   Aundra Dubin, PA-C  ?miconazole (MICOTIN) 2 % cream Apply to affected area on foot twice a day ?Patient not taking: Reported on 07/11/2021 10/15/20   Ladell Pier, MD  ?mupirocin ointment (BACTROBAN) 2 % Apply 1 application topically 2 (two) times daily. Apply twice daily ?Patient not taking: Reported on 07/11/2021 01/03/21   Aundra Dubin, PA-C  ?omeprazole (PRILOSEC) 20 MG capsule Take 1 capsule (20 mg total) by mouth daily. ?Patient not taking: Reported on 07/11/2021 03/19/21   Ladell Pier, MD  ?predniSONE (DELTASONE) 10 MG tablet Take 6 tablets by mouth on day 1, 5 tablets on day 2, 4 tablets on day 3, 3 tablets on day 4, 2 tablets on day 5, 1 tablet on day 6. 07/11/21   Argentina Donovan, PA-C  ?Sunscreen SPF50 LOTN Apply 1 application topically in the morning, at noon, and at bedtime. ?Patient not taking: Reported on 07/11/2021 10/18/19   Camillia Herter, NP  ?tetracycline (SUMYCIN) 500 MG capsule Take 500 mg by mouth 2 (two) times  daily. ?Patient not taking: Reported on 07/11/2021    [provider]  ?traMADol (ULTRAM) 50 MG tablet Take 1 tablet (50 mg total) by mouth every 12 (twelve) hours as needed. ?Patient not taking: Reported on 07/11/2021 04/30/21   Aundra Dubin, PA-C  ?triamcinolone (KENALOG) 0.1 % Apply 1 application topically daily as needed. ?Patient not taking: Reported on 07/11/2021 06/11/20   Ladell Pier, MD  ? ? ?ROS: ?Neg resp ?Neg cardiac ?Neg GI ?Neg GU ?Neg MS ?Neg psych ?Neg neuro ? ?Objective:  ? ?Vitals:  ? 07/11/21 1409  ?BP: 124/82  ?Pulse: 86  ?SpO2: 95%  ?Weight: 181 lb 9.6 oz (82.4 kg)  ? ?Exam ?General appearance : Awake, alert,  not in any distress. Speech Clear. Not toxic looking ?HEENT: Atraumatic and Normocephalic.  L eye upper lid with swelling and tenderness.  She will not tolerate shining the light into her eye.  Pupils reactive.  EOM intact.  There are some small reddish lesions just under her L eye.  No lesions on tip of nose ?Chest: Good air entry bilaterally, CTAB.  No rales/rhonchi/wheezing ?CVS: S1 S2 regular, no murmurs.  ?Extremities: B/L Lower Ext shows no edema, both legs are warm to touch ?Neurology: Awake alert, and oriented X 3, CN II-XII intact, Non focal ?Skin: No Rash ? ?Data Review ?Lab Results  ?Component Value Date  ? HGBA1C 5.6 07/05/2021  ? HGBA1C 5.4 06/11/2020  ? HGBA1C 5.7 (H) 12/15/2019  ? ? ?Assessment & Plan  ? ?1. Shingles of eyelid vs allergic reaction from ointment she used ?[facility]  will see her now if she can pay $250 up front or she will report to ED.  Do NOT use more ointment ?- valACYclovir (VALTREX) 1000 MG tablet; Take 1 tablet (1,000 mg total) by mouth 2 (two) times daily for 10 days.  Dispense: 20 tablet; Refill: 0 ?- predniSONE (DELTASONE) 10 MG tablet; Take 6 tablets by mouth on day 1, 5 tablets on day 2, 4 tablets on day 3, 3 tablets on day 4, 2 tablets on day 5, 1 tablet on day 6.  Dispense: 21 tablet; Refill: 0 ?- Ambulatory referral to Ophthalmology ?Spent more than 30 mins trying to get patient in with someone ? ?2. 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 ? ? ?Patient have been counseled extensively about nutrition and exercise. Other issues discussed during this visit include: low cholesterol diet, weight control and daily exercise, foot care, annual eye examinations at Ophthalmology, importance of adherence with medications and regular follow-up. We also discussed long term complications of uncontrolled diabetes and hypertension.  ? ?Return if symptoms worsen or fail to improve. ? ?The patient was given clear instructions to go to ER or  return to medical center if symptoms don't improve, worsen or new problems develop. The patient verbalized understanding. The patient was told to call to get lab results if they haven't heard anything in the next week.  ? ? ? ? ?Freeman Caldron, PA-C ?[place] ?[place], [place] ?[phone]   ?07/11/2021, 2:53 PM  ?

## 2021-07-11 NOTE — Patient Instructions (Signed)
[facility] [place] ,[place]   ? ?They may charge you up to $250 ?

## 2021-07-11 NOTE — Telephone Encounter (Signed)
?  Chief Complaint: eye swelling ?Symptoms: red, draining ?Frequency: constant ?Pertinent Negatives: Patient denies ever having before, no injury ?Disposition: [] ED /[] Urgent Care (no appt availability in office) / [x] Appointment(In office/virtual)/ []  [facility]/ [] Home Care/ [] Refused Recommended Disposition /[] [facility]/ []  Follow-up with PCP ?Additional Notes: Pt advised of appt today, arrival time, verbalized understanding through her interpreter who is her daughter. ? ?Reason for Disposition ? [1] SEVERE eyelid swelling on one side AND [2] red and painful (or tender to touch) ? ?Answer Assessment - Initial Assessment Questions ?1. ONSET: "When did the swelling start?" (e.g., minutes, hours, days) ?    Two days ?2. LOCATION: "What part of the eyelids is swollen?" ?    yes ?3. SEVERITY: "How swollen is it?" ?    very ?4. ITCHING: "Is there any itching?" If Yes, ask: "How much?"   (Scale 1-10; mild, moderate or severe) ?    Yes, 8 ?5. PAIN: "Is the swelling painful to touch?" If Yes, ask: "How painful is it?"   (Scale 1-10; mild, moderate or severe) ?    Yes, 8 ?6. FEVER: "Do you have a fever?" If Yes, ask: "What is it, how was it measured, and when did it start?"  ?    no ?7. CAUSE: "What do you think is causing the swelling?" ?    No idea, it just started swelling ?8. RECURRENT SYMPTOM: "Have you had eyelid swelling before?" If Yes, ask: "When was the last time?" "What happened that time?" ?    no ?9. OTHER SYMPTOMS: "Do you have any other symptoms?" (e.g., blurred vision, eye discharge, rash, runny nose) ?    Discharge watery, no, nose not running ?10. PREGNANCY: "Is there any chance you are pregnant?" "When was your last menstrual period?" ?      na ? ?Protocols used: Eye - Swelling-A-AH ? ?

## 2021-07-11 NOTE — ED Triage Notes (Signed)
Pt presents for eval of L eye swelling, clear drainage, and pain getting progressively worse since yesterday. Went to [facility] for same and they recommended she come to ED. Denies vision changes.  ?

## 2021-07-12 ENCOUNTER — Emergency Department (HOSPITAL_COMMUNITY): Payer: Self-pay

## 2021-07-12 MED ORDER — TETRACAINE HCL 0.5 % OP SOLN
2.0000 [drp] | Freq: Once | OPHTHALMIC | Status: AC
  Administered 2021-07-12: 2 [drp] via OPHTHALMIC
  Filled 2021-07-12: qty 4

## 2021-07-12 MED ORDER — CLINDAMYCIN PHOSPHATE 600 MG/50ML IV SOLN
600.0000 mg | Freq: Once | INTRAVENOUS | Status: AC
  Administered 2021-07-12: 600 mg via INTRAVENOUS
  Filled 2021-07-12: qty 50

## 2021-07-12 MED ORDER — FLUORESCEIN SODIUM 1 MG OP STRP
1.0000 | ORAL_STRIP | Freq: Once | OPHTHALMIC | Status: AC
  Administered 2021-07-12: 1 via OPHTHALMIC
  Filled 2021-07-12: qty 1

## 2021-07-12 MED ORDER — IOHEXOL 300 MG/ML  SOLN
75.0000 mL | Freq: Once | INTRAMUSCULAR | Status: AC | PRN
  Administered 2021-07-12: 75 mL via INTRAVENOUS

## 2021-07-12 MED ORDER — CLINDAMYCIN HCL 150 MG PO CAPS: 300.0000 mg | ORAL_CAPSULE | Freq: Four times a day (QID) | ORAL | 0 refills | Status: DC

## 2021-07-12 NOTE — Telephone Encounter (Signed)
Will fax to [facility]. In regards to referral  ?

## 2021-07-12 NOTE — ED Provider Notes (Signed)
[facility] EMERGENCY DEPARTMENT Provider Note   CSN: [id number] Arrival date & time: 07/11/21  1721     History  Chief Complaint  Patient presents with   Eye Problem    [name] is a 41 y.o. female.  Patient presents to the emergency department for evaluation of left eye swelling.  Patient was seen at primary care office with pain and swelling of the left eye since yesterday.  Primary care provider was concerned about the possibility of shingles, sent to the ED for further evaluation.      Home Medications Prior to Admission medications   Medication Sig Start Date End Date Taking? Authorizing Provider  acetaminophen (TYLENOL) 500 MG tablet Take 2 tablets (1,000 mg total) by mouth every 6 (six) hours as needed. Patient not taking: Reported on 07/11/2021 06/14/18   Saverio Danker, PA-C  amitriptyline (ELAVIL) 10 MG tablet Take 1 tablet (10 mg total) by mouth at bedtime. Patient not taking: Reported on 07/11/2021 12/12/20   Pieter Partridge, DO  celecoxib (CELEBREX) 200 MG capsule Take 1 capsule (200 mg total) by mouth daily. Patient not taking: Reported on 07/11/2021 03/19/21   Ladell Pier, MD  Cholecalciferol (VITAMIN D-3) 125 MCG (5000 UT) TABS Take 1 tablet by mouth daily. Patient not taking: Reported on 07/11/2021 03/18/19   Hilts, Legrand Como, MD  methocarbamol (ROBAXIN) 500 MG tablet Take 1 tablet (500 mg total) by mouth 2 (two) times daily as needed. Patient not taking: Reported on 07/11/2021 01/03/21   Aundra Dubin, PA-C  miconazole (MICOTIN) 2 % cream Apply to affected area on foot twice a day Patient not taking: Reported on 07/11/2021 10/15/20   Ladell Pier, MD  mupirocin ointment (BACTROBAN) 2 % Apply 1 application topically 2 (two) times daily. Apply twice daily Patient not taking: Reported on 07/11/2021 01/03/21   Aundra Dubin, PA-C  omeprazole (PRILOSEC) 20 MG capsule Take 1 capsule (20 mg total) by mouth daily. Patient not taking:  Reported on 07/11/2021 03/19/21   Ladell Pier, MD  predniSONE (DELTASONE) 10 MG tablet Take 6 tablets by mouth on day 1, 5 tablets on day 2, 4 tablets on day 3, 3 tablets on day 4, 2 tablets on day 5, 1 tablet on day 6. 07/11/21   Argentina Donovan, PA-C  Sunscreen SPF50 LOTN Apply 1 application topically in the morning, at noon, and at bedtime. Patient not taking: Reported on 07/11/2021 10/18/19   Camillia Herter, NP  tetracycline (SUMYCIN) 500 MG capsule Take 500 mg by mouth 2 (two) times daily. Patient not taking: Reported on 07/11/2021    [provider]  traMADol (ULTRAM) 50 MG tablet Take 1 tablet (50 mg total) by mouth every 12 (twelve) hours as needed. Patient not taking: Reported on 07/11/2021 04/30/21   Aundra Dubin, PA-C  triamcinolone (KENALOG) 0.1 % Apply 1 application topically daily as needed. Patient not taking: Reported on 07/11/2021 06/11/20   Ladell Pier, MD  valACYclovir (VALTREX) 1000 MG tablet Take 1 tablet (1,000 mg total) by mouth 2 (two) times daily for 10 days. 07/11/21 07/21/21  Argentina Donovan, PA-C      Allergies    Patient has no known allergies.    Review of Systems   Review of Systems  Eyes:  Negative for visual disturbance.   Physical Exam Updated Vital Signs BP 136/88    Pulse 73    Temp 97.6 F (36.4 C)    Resp 13  Ht 5' (1.524 m)    Wt 83 kg    LMP 07/02/2021    SpO2 98%    BMI 35.74 kg/m  Physical Exam Vitals and nursing note reviewed.  Constitutional:      General: She is not in acute distress.    Appearance: She is well-developed.  HENT:     Head: Normocephalic and atraumatic.     Mouth/Throat:     Mouth: Mucous membranes are moist.  Eyes:     General: Vision grossly intact. Gaze aligned appropriately.     Intraocular pressure: Left eye pressure is 14 mmHg.     Extraocular Movements: Extraocular movements intact.     Conjunctiva/sclera: Conjunctivae normal.     Pupils:     Left eye: No fluorescein uptake.     Comments: Slight  erythema, swelling of left upper eyelid and preseptal area, no hordeolum, chilis EN or abscess noted  Cardiovascular:     Rate and Rhythm: Normal rate and regular rhythm.     Pulses: Normal pulses.     Heart sounds: Normal heart sounds, S1 normal and S2 normal. No murmur heard.   No friction rub. No gallop.  Pulmonary:     Effort: Pulmonary effort is normal. No respiratory distress.     Breath sounds: Normal breath sounds.  Abdominal:     General: Bowel sounds are normal.     Palpations: Abdomen is soft.     Tenderness: There is no abdominal tenderness. There is no guarding or rebound.     Hernia: No hernia is present.  Musculoskeletal:        General: No swelling.     Cervical back: Full passive range of motion without pain, normal range of motion and neck supple. No spinous process tenderness or muscular tenderness. Normal range of motion.     Right lower leg: No edema.     Left lower leg: No edema.  Skin:    General: Skin is warm and dry.     Capillary Refill: Capillary refill takes less than 2 seconds.     Findings: No ecchymosis, erythema, rash or wound.  Neurological:     General: No focal deficit present.     Mental Status: She is alert and oriented to person, place, and time.     GCS: GCS eye subscore is 4. GCS verbal subscore is 5. GCS motor subscore is 6.     Cranial Nerves: Cranial nerves 2-12 are intact.     Sensory: Sensation is intact.     Motor: Motor function is intact.     Coordination: Coordination is intact.  Psychiatric:        Attention and Perception: Attention normal.        Mood and Affect: Mood normal.        Speech: Speech normal.        Behavior: Behavior normal.    ED Results / Procedures / Treatments   Labs (all labs ordered are listed, but only abnormal results are displayed) Labs Reviewed  URINALYSIS, ROUTINE W REFLEX MICROSCOPIC - Abnormal; Notable for the following components:      Result Value   Color, Urine STRAW (*)    Leukocytes,Ua  MODERATE (*)    Bacteria, UA RARE (*)    All other components within normal limits  I-STAT CHEM 8, ED - Abnormal; Notable for the following components:   Glucose, Bld 117 (*)    Hemoglobin 11.2 (*)    HCT 33.0 (*)  All other components within normal limits  I-STAT BETA HCG BLOOD, ED ([facility], [facility], [facility] ONLY)    EKG EKG Interpretation  Date/Time:  Thursday July 11 2021 17:59:44 EST Ventricular Rate:  87 PR Interval:  160 QRS Duration: 82 QT Interval:  340 QTC Calculation: 409 R Axis:   1 Text Interpretation: Normal sinus rhythm Normal ECG When compared with ECG of 13-Jun-2018 05:47, PREVIOUS ECG IS PRESENT Confirmed by Orpah Greek ([id number]) on 07/12/2021 12:08:55 AM  Radiology CT Head Wo Contrast  Result Date: 07/11/2021 CLINICAL DATA:  Acute neuro deficit. EXAM: CT HEAD WITHOUT CONTRAST TECHNIQUE: Contiguous axial images were obtained from the base of the skull through the vertex without intravenous contrast. RADIATION DOSE REDUCTION: This exam was performed according to the departmental dose-optimization program which includes automated exposure control, adjustment of the mA and/or kV according to patient size and/or use of iterative reconstruction technique. COMPARISON:  CT head 10/05/2009 FINDINGS: Brain: No evidence of acute infarction, hemorrhage, hydrocephalus, extra-axial collection or mass lesion/mass effect. Vascular: Negative for hyperdense vessel Skull: Negative Sinuses/Orbits: Paranasal sinuses clear. Mastoid clear. Negative orbit Other: None IMPRESSION: Negative CT head Electronically Signed   By: Franchot Gallo M.D.   On: 07/11/2021 18:36   CT Orbits W Contrast  Result Date: 07/12/2021 CLINICAL DATA:  41 year old female with left eye swelling drainage and pain progressive for 1-2 days. EXAM: CT ORBITS WITH CONTRAST TECHNIQUE: Multidetector CT images was performed according to the standard protocol following intravenous contrast administration. RADIATION DOSE REDUCTION:  This exam was performed according to the departmental dose-optimization program which includes automated exposure control, adjustment of the mA and/or kV according to patient size and/or use of iterative reconstruction technique. CONTRAST:  92mL OMNIPAQUE IOHEXOL 300 MG/ML  SOLN COMPARISON:  Head CT without contrast 07/11/2021. FINDINGS: Orbits: Globes appear symmetric and intact. Intraorbital soft tissues appears symmetric and normal. There is mild asymmetric left orbit preseptal soft tissue thickening and stranding (series 3, image 29) with no soft tissue gas or fluid collection. No postseptal involvement. Right periorbital soft tissues appear normal. Visible paranasal sinuses: Clear. Tympanic cavities and mastoids are clear. Soft tissues: Visualized scalp soft tissues are within normal limits. Negative visible deep soft tissue spaces of the face, including the masticator, carotid, parapharyngeal spaces. Visible major vascular structures at the skull base are enhancing and appear to be patent, including the cavernous sinus. Osseous: Intact orbital walls. Bone mineralization is within normal limits. No acute osseous abnormality identified. Limited intracranial: Negative.  No abnormal enhancement identified. IMPRESSION: 1. Left preseptal soft tissue thickening and inflammation compatible with Preseptal Cellulitis. No postseptal involvement, abscess, or other complicating features. 2. Otherwise negative Orbit CT Electronically Signed   By: Genevie Ann M.D.   On: 07/12/2021 04:42    Procedures Procedures    Medications Ordered in ED Medications  clindamycin (CLEOCIN) IVPB 600 mg (has no administration in time range)  tetracaine (PONTOCAINE) 0.5 % ophthalmic solution 2 drop (2 drops Left Eye Given by Other 07/12/21 0035)  fluorescein ophthalmic strip 1 strip (1 strip Left Eye Given by Other 07/12/21 0035)  iohexol (OMNIPAQUE) 300 MG/ML solution 75 mL (75 mLs Intravenous Contrast Given 07/12/21 0422)    ED Course/  Medical Decision Making/ A&P                           Medical Decision Making Amount and/or Complexity of Data Reviewed Radiology: ordered.  Risk Prescription drug management.   Patient referred  to the emergency department for further evaluation of eye swelling and pain.  Examination reveals mild, diffuse swelling of the left upper eyelid.  No conjunctival inflammation.  No drainage from the eye.  Patient's intraocular pressure is normal.  Fluorescein exam of the patient's cornea reveals no uptake, no dendritic lesions to suggest herpes keratitis.  Patient underwent CT scan of orbits with IV contrast which shows evidence of preseptal cellulitis, no deep infection or abscess.  Patient administered clindamycin and will be discharged with antibiotics.  Follow-up with ophthalmology on-call.        Final Clinical Impression(s) / ED Diagnoses Final diagnoses:  Preseptal cellulitis of left eye    Rx / DC Orders ED Discharge Orders     None         Anai Lipson, Gwenyth Allegra, MD 07/12/21 [phone]

## 2021-07-12 NOTE — Discharge Instructions (Addendum)
Llame al Dr. Lucianne Lei, un oftalm?logo, para programar un seguimiento en la oficina. ?

## 2021-07-12 NOTE — ED Notes (Signed)
ED Provider at bedside. 

## 2021-07-12 NOTE — ED Notes (Signed)
Patient ambulated to the bathroom with a steady gait

## 2021-07-12 NOTE — ED Notes (Signed)
Report handed off to [employer] ?

## 2021-07-13 ENCOUNTER — Other Ambulatory Visit: Payer: Self-pay | Admitting: Nurse Practitioner

## 2021-07-13 DIAGNOSIS — A048 Other specified bacterial intestinal infections: Secondary | ICD-10-CM

## 2021-07-13 MED ORDER — CLARITHROMYCIN 500 MG PO TABS
500.0000 mg | ORAL_TABLET | Freq: Two times a day (BID) | ORAL | 0 refills | Status: DC
  Filled 2021-07-13: qty 28, 14d supply, fill #0

## 2021-07-13 MED ORDER — OMEPRAZOLE 20 MG PO CPDR
20.0000 mg | DELAYED_RELEASE_CAPSULE | Freq: Two times a day (BID) | ORAL | 0 refills | Status: DC
  Filled 2021-07-13: qty 28, 14d supply, fill #0

## 2021-07-13 MED ORDER — AMOXICILLIN 500 MG PO CAPS
1000.0000 mg | ORAL_CAPSULE | Freq: Two times a day (BID) | ORAL | 0 refills | Status: AC
  Filled 2021-07-13: qty 56, 14d supply, fill #0

## 2021-07-15 ENCOUNTER — Other Ambulatory Visit: Payer: Self-pay

## 2021-07-15 ENCOUNTER — Telehealth: Payer: Self-pay

## 2021-07-15 ENCOUNTER — Ambulatory Visit: Payer: Self-pay

## 2021-07-15 DIAGNOSIS — M6281 Muscle weakness (generalized): Secondary | ICD-10-CM

## 2021-07-15 DIAGNOSIS — M545 Low back pain, unspecified: Secondary | ICD-10-CM | POA: Diagnosis not present

## 2021-07-15 DIAGNOSIS — G8929 Other chronic pain: Secondary | ICD-10-CM

## 2021-07-15 NOTE — Telephone Encounter (Signed)
Called patient reviewed all information and repeated back to me. Will call if any questions.  ?With the help of interpretor Imelda [phone]  ?

## 2021-07-15 NOTE — Therapy (Signed)
?OUTPATIENT PHYSICAL THERAPY TREATMENT NOTE ? ? ?Patient Name: Kari Ortega ?MRN: [id number] ?DOB:1980-10-09, 41 y.o., female ?Today's Date: 07/15/2021 ? ?PCP: Ladell Pier, MD ?REFERRING PROVIDER: Ladell Pier, MD ? ? PT End of Session - 07/15/21 1708   ? ? Visit Number 6   ? Number of Visits 17   ? Date for PT Re-Evaluation 08/15/21   ? Authorization Type Med Pay   ? PT Start Time 5188   ? PT Stop Time 4166   ? PT Time Calculation (min) 40 min   ? Activity Tolerance Patient tolerated treatment well;No increased pain   ? Behavior During Therapy [facility] for tasks assessed/performed   ? ?  ?  ? ?  ? ? ? ? ?Past Medical History:  ?Diagnosis Date  ? Anemia   ? Facial paralysis   ? 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  Unable to determine reason per pt.    ? Irregular periods   ? ?Past Surgical History:  ?Procedure Laterality Date  ? CHOLECYSTECTOMY N/A 06/14/2018  ? Procedure: LAPAROSCOPIC CHOLECYSTECTOMY;  Surgeon: [name], [name], MD;  Location: [place];  Service: General;  Laterality: N/A;  ? TOOTH EXTRACTION    ? ?Patient Active Problem List  ? Diagnosis Date Noted  ? Bulge of lumbar disc without myelopathy 05/24/2021  ? Carpal tunnel syndrome of right wrist 11/10/2019  ? Symptomatic cholelithiasis 06/13/2018  ? Postpartum care and examination 04/29/2018  ? Urine frequency 04/29/2018  ? Normal labor 03/18/2018  ? 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 BMI 30-39.9 09/21/2017  ? Obesity in pregnancy 09/21/2017  ? Language barrier 09/21/2017  ? Supervision of high risk pregnancy, antepartum 08/24/2017  ? AMA (advanced maternal age) multigravida 35+ 08/24/2017  ? HYPERTRIGLYCERIDEMIA 12/15/2006  ? ? ?REFERRING DIAG:  ?M54.50,G89.29 (ICD-10-CM) - Chronic low back pain, unspecified back pain laterality, unspecified whether sciatica present ? ?THERAPY DIAG:  ?Chronic bilateral low back pain, unspecified whether sciatica present ? ?Muscle weakness (generalized) ? ?PERTINENT HISTORY:   ?None ? ?PRECAUTIONS:  ?None ? ?SUBJECTIVE: Pain localized to central LS region, onset with flexion and relieved by avoiding flexion. Denies radiculopathy/LE symptoms.   ? ? ?Pain: ?Are you having pain? Yes ?NPRS: 2/10 ?Pain Location: lower back ?Pain location: lower back, bilateral hips, R>L  ?PAIN TYPE: aching and burning ?Pain description: intermittent  ?Aggravating factors: prolonged sitting, walking,  ?Relieving factors: exercise, rest ? ? ?OBJECTIVE:  ?   ?PATIENT SURVEYS:  ?FOTO 47% function; 63% predicted; 07/15/21 FOTO 65% ?  ?LUMBARAROM/PROM ?  ?A/PROM A/PROM  ?06/20/2021  ?Flexion Increased pain; motion reduced  ?Extension No pain; reduced motion  ? (Blank rows = not tested) ?  ?  ?LUMBAR SPECIAL TESTS:  ?Straight leg raise test: Positive Right ?  ?FUNCTIONAL TESTS:  ?30 Second Sit to Stand: 5 reps ?  ?GAIT: ?Distance walked: 42ft ?Assistive device utilized: None ?Level of assistance: Complete Independence ?Comments: decreased trunk extension; slowed gait speed ?  ?TODAY'S TREATMENT  ?[facility] Adult PT Treatment:                                                DATE: 07/15/21 ?Therapeutic Exercise: ?NuStep lvl 4 UE/LE x 8  ?SL open book 10x B ?B hip fallouts against GTB  ?Curl ups 15x2 ?Bridge 2x15 GTB ?Deadlifts with 10# KB, 10x alternating with and without  weight, emphasizing body mechanics ? ? ? ? ?[facility] Adult PT Treatment:                                                DATE: 31//23 ?Therapeutic Exercise: ?NuStep lvl 4 UE/LE x 8  ?Modified thomas stretch 30s x2 B ?S/L clamshell 2x15 GTB ?Bridge 2x15 - 3" hold w/ ball squeeze ?STS 2x10 holding 5# KB - from elevated table with OH reach/outstretched arms for postural emphasis ?Modified side planks 2x15" ?Paloff press 2x10 7# each way ?Manual Therapy: ?Trigger point release to R piriformis ?Manual stretch to R hip ER/ER ? ?[facility] Adult PT Treatment:                                                DATE: 07/08/21 ?Therapeutic Exercise: ?NuStep lvl 4 UE/LE x 8  ?Modified  thomas stretch 30s x2 B ?S/L clamshell 1x15 GTB ?Bridge 2x15 - 3" hold w/ ball squeeze ?STS 2x10 holding 5# KB - from elevated table with OH reach/outstretched arms for postural emphasis ?Supine hip fallouts GTB 15x ?Figure 4 piriformis stretch 30s x2 B ?Manual Therapy: ?R piriformis release in L side lie 8 min ? ?[facility] Adult PT Treatment:                                                DATE: 07/03/2021 ?Therapeutic Exercise: ?NuStep lvl 5 UE/LE x 5 min while taking subjective ?Modified thomas stretch x 60"  each ?S/L clamshell 2x15 GTB ?Bridge 2x10 - 3" hold w/ ball squeeze ?Modified side plank 2x15" each ?Lateral walk x 3 laps in // RTB ?Standing hip ext 2x10 RTB ?STS x 15 - from elevated table ?Figure 4 piriformis stretch 30s x2 B ?Manual Therapy: ?Manual stretching into bilateral hip ER ?Manual stretching of bilateral hamstrings ?IASTM via percussion device to R piriformis, R QL ? ?PATIENT EDUCATION:  ?Education details: eval findings, FOTO, HEP, POC ?Person educated: Patient ?Education method: Explanation, Demonstration, and Handouts ?Education comprehension: verbalized understanding and returned demonstration ?  ?  ?HOME EXERCISE PROGRAM: ?Access Code: [email or web address] ?URL: [email or web address] ?Date: 06/20/2021 ?Prepared by: Octavio Manns ?  ?Exercises ?Supine Bridge - 1 x daily - 7 x weekly - 2 sets - 10 reps - 3 sec hold ?Supine Piriformis Stretch with Leg Straight - 1 x daily - 7 x weekly - 2 sets - 20 sec hold ?Hooklying Clamshell with Resistance - 1 x daily - 7 x weekly - 3 sets - 10 reps ?Modified Thomas Stretch - 1 x daily - 7 x weekly - 2 reps - 60 sec hold ?  ?  ?ASSESSMENT: Pain centralized to lumbosacral region w/o radiation, reproduced with both flexion and extension movements suggesting joint vs soft tissue dysfunction.  Able to perform proper squat at end of session w/o low back pain.  Emphasized need to use proper mechanics when lifting.  FOTO score exceeds goal today. ? ?  ?GOALS: ?Goals  reviewed with patient? No ?  ?SHORT TERM GOALS: ?  ?STG Name Target Date Goal status  ?1 Pt will be compliant and knowledgeable with initial  HEP for improved comfort and carryover ?Baseline: initial HEP given 07/11/2021 MET  ?2 Pt will self report lower back pain no greater than 6/10 for improved comfort and functional ability ?Baseline: 9/10 at worst 07/11/2021 MET  ?  ?LONG TERM GOALS:  ?  ?LTG Name Target Date Goal status  ?1 Pt will self report lower back pain no greater than 2/10 for improved comfort and functional ability ?Baseline: 9/10 at worst 08/15/2021 INITIAL  ?2 Pt will improve FOTO function score to no less than 63% as proxy for functional improvement ?Baseline: 47% function 08/15/2021 INITIAL  ?3 Pt will increase reps in 30 Second Sit to Stand by no less than 8 reps for improved functional mobility ?Baseline: 5 reps 08/15/2021 INITIAL  ?4 Pt will be able lift and deadlift 45# to improve functional ability to lift her children and perform childcare duties ?Baseline: unable 08/15/2021 INITIAL  ?  ?PLAN: ?PT FREQUENCY: 1-2x/week ?  ?PT DURATION: 8 weeks ?  ?PLANNED INTERVENTIONS: Therapeutic exercises, Therapeutic activity, Neuro Muscular re-education, Balance training, Gait training, Patient/Family education, Joint mobilization, Aquatic Therapy, Dry Needling, Cryotherapy, Moist heat, Ultrasound, and Manual therapy ?  ?PLAN FOR NEXT SESSION: assess HEP response, core and proximal hip strengthening, continue B hip stretch and strength, postural control with functional tasks, emphasize squatting with proper body mechanics ? ? ? ?Lanice Shirts, PT ?07/15/2021, 5:13 PM ? ?  ? ?

## 2021-07-15 NOTE — Telephone Encounter (Signed)
Called patient reviewed all information and repeated back to me. Will call if any questions.  ?With the help of interpretor Imelda [phone]  ?

## 2021-07-17 ENCOUNTER — Other Ambulatory Visit: Payer: Self-pay

## 2021-07-17 ENCOUNTER — Ambulatory Visit: Payer: Self-pay

## 2021-07-17 DIAGNOSIS — M6281 Muscle weakness (generalized): Secondary | ICD-10-CM

## 2021-07-17 DIAGNOSIS — M545 Low back pain, unspecified: Secondary | ICD-10-CM | POA: Diagnosis not present

## 2021-07-17 DIAGNOSIS — G8929 Other chronic pain: Secondary | ICD-10-CM

## 2021-07-17 NOTE — Therapy (Signed)
?OUTPATIENT PHYSICAL THERAPY TREATMENT NOTE ? ? ?Patient Name: Kari Ortega ?MRN: [id number] ?DOB:10-02-1980, 41 y.o., female ?Today's Date: 07/17/2021 ? ?PCP: Ladell Pier, MD ?REFERRING PROVIDER: Aundra Dubin, PA-C ? ? PT End of Session - 07/17/21 1712   ? ? Visit Number 7   ? Number of Visits 17   ? Date for PT Re-Evaluation 08/15/21   ? Authorization Type Med Pay   ? PT Start Time 1710   ? PT Stop Time 1740   ? PT Time Calculation (min) 30 min   ? Activity Tolerance Patient tolerated treatment well;No increased pain   ? Behavior During Therapy [facility] for tasks assessed/performed   ? ?  ?  ? ?  ? ? ? ? ? ?Past Medical History:  ?Diagnosis Date  ? Anemia   ? Facial paralysis   ? 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  Unable to determine reason per pt.    ? Irregular periods   ? ?Past Surgical History:  ?Procedure Laterality Date  ? CHOLECYSTECTOMY N/A 06/14/2018  ? Procedure: LAPAROSCOPIC CHOLECYSTECTOMY;  Surgeon: [name], [name], MD;  Location: [place];  Service: General;  Laterality: N/A;  ? TOOTH EXTRACTION    ? ?Patient Active Problem List  ? Diagnosis Date Noted  ? Bulge of lumbar disc without myelopathy 05/24/2021  ? Carpal tunnel syndrome of right wrist 11/10/2019  ? Symptomatic cholelithiasis 06/13/2018  ? Postpartum care and examination 04/29/2018  ? Urine frequency 04/29/2018  ? Normal labor 03/18/2018  ? 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 BMI 30-39.9 09/21/2017  ? Obesity in pregnancy 09/21/2017  ? Language barrier 09/21/2017  ? Supervision of high risk pregnancy, antepartum 08/24/2017  ? AMA (advanced maternal age) multigravida 35+ 08/24/2017  ? HYPERTRIGLYCERIDEMIA 12/15/2006  ? ? ?REFERRING DIAG:  ?M54.50,G89.29 (ICD-10-CM) - Chronic low back pain, unspecified back pain laterality, unspecified whether sciatica present ? ?THERAPY DIAG:  ?Chronic bilateral low back pain, unspecified whether sciatica present ? ?Muscle weakness (generalized) ? ?PERTINENT HISTORY:   ?None ? ?PRECAUTIONS:  ?None ? ?SUBJECTIVE:  ?Pt presents to PT with continued reports of decreased pain. She has been compliant with HEP with no adverse effect. Pt is ready to begin PT treatment at this time,  ? ?Pain: ?Are you having pain? Yes ?NPRS: 2/10 ?Pain Location: lower back ?Pain location: lower back, bilateral hips, R>L  ?PAIN TYPE: aching and burning ?Pain description: intermittent  ?Aggravating factors: prolonged sitting, walking,  ?Relieving factors: exercise, rest ? ? ?OBJECTIVE:  ?   ?PATIENT SURVEYS:  ?FOTO 47% function; 63% predicted; 07/15/21 FOTO 65% ?  ?LUMBARAROM/PROM ?  ?A/PROM A/PROM  ?06/20/2021  ?Flexion Increased pain; motion reduced  ?Extension No pain; reduced motion  ? (Blank rows = not tested) ?  ?  ?LUMBAR SPECIAL TESTS:  ?Straight leg raise test: Positive Right ?  ?FUNCTIONAL TESTS:  ?30 Second Sit to Stand: 5 reps ?  ?GAIT: ?Distance walked: 76ft ?Assistive device utilized: None ?Level of assistance: Complete Independence ?Comments: decreased trunk extension; slowed gait speed ?  ?TODAY'S TREATMENT  ?[facility] Adult PT Treatment:                                                DATE: 07/17/21 ?Therapeutic Exercise: ?NuStep lvl 5 UE/LE x 3 min ?SL open book 10x each ?Supine clamshell 2x20 GTB ?90/90 table top hold  3x10"  ?Bridge 2x15 GTB ?Modified side plank 3x20" each ?Deadlifts with 3x10 10# KB ? ?[facility] Adult PT Treatment:                                                DATE: 07/15/21 ?Therapeutic Exercise: ?NuStep lvl 4 UE/LE x 8  ?SL open book 10x B ?B hip fallouts against GTB  ?Curl ups 15x2 ?Bridge 2x15 GTB ?Deadlifts with 10# KB, 10x alternating with and without weight, emphasizing body mechanics ? ?[facility] Adult PT Treatment:                                                DATE: 31//23 ?Therapeutic Exercise: ?NuStep lvl 4 UE/LE x 8  ?Modified thomas stretch 30s x2 B ?S/L clamshell 2x15 GTB ?Bridge 2x15 - 3" hold w/ ball squeeze ?STS 2x10 holding 5# KB - from elevated table with OH  reach/outstretched arms for postural emphasis ?Modified side planks 2x15" ?Paloff press 2x10 7# each way ?Manual Therapy: ?Trigger point release to R piriformis ?Manual stretch to R hip ER/ER ? ?PATIENT EDUCATION:  ?Education details: eval findings, FOTO, HEP, POC ?Person educated: Patient ?Education method: Explanation, Demonstration, and Handouts ?Education comprehension: verbalized understanding and returned demonstration ?  ?  ?HOME EXERCISE PROGRAM: ?Access Code: [email or web address] ?URL: [email or web address] ?Date: 06/20/2021 ?Prepared by: Octavio Manns ?  ?Exercises ?Supine Bridge - 1 x daily - 7 x weekly - 2 sets - 10 reps - 3 sec hold ?Supine Piriformis Stretch with Leg Straight - 1 x daily - 7 x weekly - 2 sets - 20 sec hold ?Hooklying Clamshell with Resistance - 1 x daily - 7 x weekly - 3 sets - 10 reps ?Modified Thomas Stretch - 1 x daily - 7 x weekly - 2 reps - 60 sec hold ?  ?  ?ASSESSMENT:  ?Pt was again able to complete all prescribed exercises with no adverse effect or increase in pain. Therapy today continued to progress core and proximal hip strengthening. Pt continues to progress well with therapy, showing improving strength and activity tolerance. She continues to benefit from skilled PT services and will continue to be seen and progressed as tolerated.  ? ?  ?GOALS: ?Goals reviewed with patient? No ?  ?SHORT TERM GOALS: ?  ?STG Name Target Date Goal status  ?1 Pt will be compliant and knowledgeable with initial HEP for improved comfort and carryover ?Baseline: initial HEP given 07/11/2021 MET  ?2 Pt will self report lower back pain no greater than 6/10 for improved comfort and functional ability ?Baseline: 9/10 at worst 07/11/2021 MET  ?  ?LONG TERM GOALS:  ?  ?LTG Name Target Date Goal status  ?1 Pt will self report lower back pain no greater than 2/10 for improved comfort and functional ability ?Baseline: 9/10 at worst 08/15/2021 INITIAL  ?2 Pt will improve FOTO function score to no less than  63% as proxy for functional improvement ?Baseline: 47% function 08/15/2021 INITIAL  ?3 Pt will increase reps in 30 Second Sit to Stand by no less than 8 reps for improved functional mobility ?Baseline: 5 reps 08/15/2021 INITIAL  ?4 Pt will be able lift and deadlift 45# to improve functional ability to lift her children and perform  childcare duties ?Baseline: unable 08/15/2021 INITIAL  ?  ?PLAN: ?PT FREQUENCY: 1-2x/week ?  ?PT DURATION: 8 weeks ?  ?PLANNED INTERVENTIONS: Therapeutic exercises, Therapeutic activity, Neuro Muscular re-education, Balance training, Gait training, Patient/Family education, Joint mobilization, Aquatic Therapy, Dry Needling, Cryotherapy, Moist heat, Ultrasound, and Manual therapy ?  ?PLAN FOR NEXT SESSION: assess HEP response, core and proximal hip strengthening, continue B hip stretch and strength, postural control with functional tasks, emphasize squatting with proper body mechanics ? ? ? ?Ward Chatters, PT ?07/17/2021, 5:44 PM ? ?  ? ?

## 2021-07-18 ENCOUNTER — Encounter: Payer: Self-pay | Admitting: Physician Assistant

## 2021-07-18 ENCOUNTER — Other Ambulatory Visit: Payer: Self-pay

## 2021-07-18 ENCOUNTER — Ambulatory Visit: Payer: Self-pay | Attending: Physician Assistant | Admitting: Physician Assistant

## 2021-07-18 VITALS — BP 116/79 | HR 74 | Resp 18 | Ht 60.0 in | Wt 182.1 lb

## 2021-07-18 DIAGNOSIS — Z09 Encounter for follow-up examination after completed treatment for conditions other than malignant neoplasm: Secondary | ICD-10-CM

## 2021-07-18 DIAGNOSIS — Z789 Other specified health status: Secondary | ICD-10-CM

## 2021-07-18 DIAGNOSIS — L03213 Periorbital cellulitis: Secondary | ICD-10-CM

## 2021-07-18 DIAGNOSIS — E278 Other specified disorders of adrenal gland: Secondary | ICD-10-CM

## 2021-07-18 MED ORDER — DEXAMETHASONE 1 MG PO TABS
ORAL_TABLET | ORAL | 0 refills | Status: DC
  Filled 2021-07-18: qty 1, 1d supply, fill #0

## 2021-07-18 MED ORDER — AMOXICILLIN-POT CLAVULANATE 875-125 MG PO TABS
1.0000 | ORAL_TABLET | Freq: Two times a day (BID) | ORAL | 0 refills | Status: DC
  Filled 2021-07-18: qty 20, 10d supply, fill #0

## 2021-07-18 NOTE — Progress Notes (Signed)
Patient ID: Kari Ortega, female   DOB: 05/09/1981, 41 y.o.   MRN: [id number] ? ? ?Kari Ortega, is a 41 y.o. female ? ?[id number] ? ?[id number] ? ?DOB - January 18, 1981 ? ?Chief Complaint  ?Patient presents with  ? Eye Problem  ?    ? ?Subjective:  ? ?Kari Ortega is a 41 y.o. female here today for a follow up visit after being seen by me and ED with preseptal cellulitis last week.  She is taking clindamycin but it is upsetting her stomach and she did not mention to me or the ED doctor that she is still nursing her 82 yr old son at night.  Eye is feeling 80% better ? 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  See result notes by dr Wynetta Emery. No tachycardia/palpitations/sweats.   ? ? ? Patient has No headache, No chest pain, No abdominal pain - No Nausea, No new weakness tingling or numbness, No Cough - SOB. ? ?No problems updated. ? ?ALLERGIES: ?No Known Allergies ? ?PAST MEDICAL HISTORY: ?Past Medical History:  ?Diagnosis Date  ? Anemia   ? Facial paralysis   ? 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  Unable to determine reason per pt.    ? Irregular periods   ? ? ?MEDICATIONS AT HOME: ?Prior to Admission medications   ?Medication Sig Start Date End Date Taking? Authorizing Provider  ?amoxicillin-clavulanate (AUGMENTIN) 875-125 MG tablet Take 1 tablet by mouth 2 (two) times daily. 07/18/21  Yes Argentina Donovan, PA-C  ?celecoxib (CELEBREX) 200 MG capsule Take 1 capsule (200 mg total) by mouth daily. 03/19/21  Yes Ladell Pier, MD  ?dexamethasone (DECADRON) 1 MG tablet Take this tablet the NIGHT BEFORE your lab appt 07/18/21  Yes Argentina Donovan, PA-C  ?omeprazole (PRILOSEC) 20 MG capsule Take 1 capsule (20 mg total) by mouth 2 (two) times daily before a meal for 14 days. 07/13/21 07/29/21 Yes Gildardo Pounds, NP  ?valACYclovir (VALTREX) 1000 MG tablet Take 1 tablet (1,000 mg total) by mouth 2 (two) times daily for 10 days. 07/11/21 07/21/21 Yes  Argentina Donovan, PA-C  ?acetaminophen (TYLENOL) 500 MG tablet Take 2 tablets (1,000 mg total) by mouth every 6 (six) hours as needed. ?Patient taking differently: Take 1,000 mg by mouth every 6 (six) hours as needed for moderate pain or headache. 06/14/18   Saverio Danker, PA-C  ?amitriptyline (ELAVIL) 10 MG tablet Take 1 tablet (10 mg total) by mouth at bedtime. ?Patient not taking: Reported on 07/11/2021 12/12/20   Pieter Partridge, DO  ?amoxicillin (AMOXIL) 500 MG capsule Take 2 capsules (1,000 mg total) by mouth 2 (two) times daily for 14 days. 07/13/21 07/29/21  Gildardo Pounds, NP  ?Cholecalciferol (VITAMIN D-3) 125 MCG (5000 UT) TABS Take 1 tablet by mouth daily. ?Patient not taking: Reported on 07/12/2021 03/18/19   Hilts, Legrand Como, MD  ?methocarbamol (ROBAXIN) 500 MG tablet Take 1 tablet (500 mg total) by mouth 2 (two) times daily as needed. ?Patient not taking: Reported on 07/11/2021 01/03/21   Aundra Dubin, PA-C  ?miconazole (MICOTIN) 2 % cream Apply to affected area on foot twice a day ?Patient not taking: Reported on 07/11/2021 10/15/20   Ladell Pier, MD  ?mupirocin ointment (BACTROBAN) 2 % Apply 1 application topically 2 (two) times daily. Apply twice daily ?Patient not taking: Reported on 07/11/2021 01/03/21   Aundra Dubin, PA-C  ?omeprazole (PRILOSEC) 20 MG capsule Take 1 capsule (20 mg  total) by mouth daily. ?Patient not taking: Reported on 07/11/2021 03/19/21   Ladell Pier, MD  ?Sunscreen SPF50 LOTN Apply 1 application topically in the morning, at noon, and at bedtime. ?Patient not taking: Reported on 07/11/2021 10/18/19   Camillia Herter, NP  ?traMADol (ULTRAM) 50 MG tablet Take 1 tablet (50 mg total) by mouth every 12 (twelve) hours as needed. ?Patient not taking: Reported on 07/11/2021 04/30/21   Aundra Dubin, PA-C  ?triamcinolone (KENALOG) 0.1 % Apply 1 application topically daily as needed. ?Patient not taking: Reported on 07/11/2021 06/11/20   Ladell Pier, MD  ? ? ?ROS: ?Neg resp ?Neg  cardiac ?Neg GI ?Neg GU ?Neg MS ?Neg psych ?Neg neuro ? ?Objective:  ? ?Vitals:  ? 07/18/21 0854  ?BP: 116/79  ?Pulse: 74  ?Resp: 18  ?SpO2: 97%  ?Weight: 182 lb 2 oz (82.6 kg)  ?Height: 5' (1.524 m)  ? ?Exam ?General appearance : Awake, alert, not in any distress. Speech Clear. Not toxic looking ?HEENT: Atraumatic and Normocephalic, pupils equally reactive to light and accomodation, swelling is insignificant now and no erythema.  Conjunctivae WNL.  Gross vision is normal.  PERRLA ?Neck: Supple, no JVD. No cervical lymphadenopathy.  ?Chest: Good air entry bilaterally, CTAB.  No rales/rhonchi/wheezing ?CVS: S1 S2 regular, no murmurs.  ?Extremities: B/L Lower Ext shows no edema, both legs are warm to touch ?Neurology: Awake alert, and oriented X 3, CN II-XII intact, Non focal ?Skin: No Rash ? ?Data Review ?Lab Results  ?Component Value Date  ? HGBA1C 5.6 07/05/2021  ? HGBA1C 5.4 06/11/2020  ? HGBA1C 5.7 (H) 12/15/2019  ? ? ?Assessment & Plan  ? ?1. Adrenal mass ([place]) ?Just finished prednisone-will need to give some time prior to blood work(minimum of 3 weeks ?- Metanephrines, plasma; Future ?- Aldosterone + renin activity w/ ratio; Future ?- DHEA-sulfate; Future ?- Cortisol-am, blood; Future ?- dexamethasone (DECADRON) 1 MG tablet; Take this tablet the NIGHT BEFORE your lab appt  Dispense: 1 tablet; Refill: 0 ? ?2. Preseptal cellulitis of left eye ?Resolving.  Continue valtrex. Stop clindamycin while nursing ?- amoxicillin-clavulanate (AUGMENTIN) 875-125 MG tablet; Take 1 tablet by mouth 2 (two) times daily.  Dispense: 20 tablet; Refill: 0 ? ?3. Breast feeding status of mother ?Hold clinda ?- amoxicillin-clavulanate (AUGMENTIN) 875-125 MG tablet; Take 1 tablet by mouth 2 (two) times daily.  Dispense: 20 tablet; Refill: 0 ? ?4. Encounter for examination following treatment at hospital ?Much improved ? ?5. [employer] interpreters used([employer]) and additional time performing visit was required. ? ? ? ? ?Patient have been  counseled extensively about nutrition and exercise. Other issues discussed during this visit include: low cholesterol diet, weight control and daily exercise, foot care, annual eye examinations at Ophthalmology, importance of adherence with medications and regular follow-up. We also discussed long term complications of uncontrolled diabetes and hypertension.  ? ?Return for lab appt in 3 weeks and appt for lab only and in 4 weeks Dr Desiree Hane Dr Lenna Sciara). ? ?The patient was given clear instructions to go to ER or return to medical center if symptoms don't improve, worsen or new problems develop. The patient verbalized understanding. The patient was told to call to get lab results if they haven't heard anything in the next week.  ? ? ? ? ?Freeman Caldron, PA-C ?[place] ?[place], [place] ?[phone]   ?07/18/2021, 9:25 AM  ?

## 2021-07-19 ENCOUNTER — Other Ambulatory Visit: Payer: Self-pay

## 2021-07-22 ENCOUNTER — Ambulatory Visit: Payer: Self-pay

## 2021-07-22 ENCOUNTER — Other Ambulatory Visit: Payer: Self-pay

## 2021-07-22 DIAGNOSIS — M6281 Muscle weakness (generalized): Secondary | ICD-10-CM

## 2021-07-22 DIAGNOSIS — G8929 Other chronic pain: Secondary | ICD-10-CM

## 2021-07-22 DIAGNOSIS — M545 Low back pain, unspecified: Secondary | ICD-10-CM | POA: Diagnosis not present

## 2021-07-22 NOTE — Therapy (Signed)
?OUTPATIENT PHYSICAL THERAPY TREATMENT NOTE ? ? ?Patient Name: Kari Ortega ?MRN: [id number] ?DOB:1981/01/06, 41 y.o., female ?Today's Date: 07/22/2021 ? ?PCP: Ladell Pier, MD ?REFERRING PROVIDER: Ladell Pier, MD ? ? PT End of Session - 07/22/21 1709   ? ? Visit Number 8   ? Number of Visits 17   ? Date for PT Re-Evaluation 08/15/21   ? Authorization Type Med Pay   ? PT Start Time 1710   ? PT Stop Time 2671   ? PT Time Calculation (min) 35 min   ? Activity Tolerance Patient tolerated treatment well;No increased pain   ? Behavior During Therapy [facility] for tasks assessed/performed   ? ?  ?  ? ?  ? ? ? ? ? ?Past Medical History:  ?Diagnosis Date  ? Anemia   ? Facial paralysis   ? 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  Unable to determine reason per pt.    ? Irregular periods   ? ?Past Surgical History:  ?Procedure Laterality Date  ? CHOLECYSTECTOMY N/A 06/14/2018  ? Procedure: LAPAROSCOPIC CHOLECYSTECTOMY;  Surgeon: [name], [name], MD;  Location: [place];  Service: General;  Laterality: N/A;  ? TOOTH EXTRACTION    ? ?Patient Active Problem List  ? Diagnosis Date Noted  ? Bulge of lumbar disc without myelopathy 05/24/2021  ? Carpal tunnel syndrome of right wrist 11/10/2019  ? Symptomatic cholelithiasis 06/13/2018  ? Postpartum care and examination 04/29/2018  ? Urine frequency 04/29/2018  ? Normal labor 03/18/2018  ? 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 BMI 30-39.9 09/21/2017  ? Obesity in pregnancy 09/21/2017  ? Language barrier 09/21/2017  ? Supervision of high risk pregnancy, antepartum 08/24/2017  ? AMA (advanced maternal age) multigravida 35+ 08/24/2017  ? HYPERTRIGLYCERIDEMIA 12/15/2006  ? ? ?REFERRING DIAG:  ?M54.50,G89.29 (ICD-10-CM) - Chronic low back pain, unspecified back pain laterality, unspecified whether sciatica present ? ?THERAPY DIAG:  ?Chronic bilateral low back pain, unspecified whether sciatica present ? ?Muscle weakness (generalized) ? ?PERTINENT HISTORY:   ?None ? ?PRECAUTIONS:  ?None ? ?SUBJECTIVE: Central low back pain with prolonged walking and sitting, denies radicular symptoms, symptoms ease with rest and supine position. ? ?Pain: ?Are you having pain? Yes ?NPRS: 4/10 ?Pain Location: lower back ?Pain location: lower back, bilateral hips, R>L  ?PAIN TYPE: aching and burning ?Pain description: intermittent  ?Aggravating factors: prolonged sitting, walking,  ?Relieving factors: exercise, rest ? ? ?OBJECTIVE:  ?   ?PATIENT SURVEYS:  ?FOTO 47% function; 63% predicted; 07/15/21 FOTO 65% ?  ?LUMBARAROM/PROM ?  ?A/PROM A/PROM  ?06/20/2021  ?Flexion Increased pain; motion reduced  ?Extension No pain; reduced motion  ? (Blank rows = not tested) ?  ?  ?LUMBAR SPECIAL TESTS:  ?Straight leg raise test: Positive Right ?  ?FUNCTIONAL TESTS:  ?30 Second Sit to Stand: 5 reps ?  ?GAIT: ?Distance walked: 25f ?Assistive device utilized: None ?Level of assistance: Complete Independence ?Comments: decreased trunk extension; slowed gait speed ?  ?[facility]  [facility]                                                [facility] ?Therapeutic Exercise: ?NuStep lvl 5 UE/LE x 6 min ?SL open book 10x each ?Bridge 2x15 GTB ?Single leg bridge 10x B ?Assessed for functional leg length discrepancy in supine, none noted ?SL clams 10x  B against manual resistance to assess pain driver ?SL abduction 10x B against manual resistance to assess pain driver ?SL abduction in hip extension 10x B against manual resistance to assess pain driver ? ?[facility] Adult PT Treatment:                                                DATE: 07/17/21 ?Therapeutic Exercise: ?NuStep lvl 5 UE/LE x 3 min ?SL open book 10x each ?Supine clamshell 2x20 GTB ?90/90 table top hold 3x10"  ?Bridge 2x15 GTB ?Modified side plank 3x20" each ?Deadlifts with 3x10 10# KB ? ?[facility] Adult PT Treatment:                                                DATE: 07/15/21 ?Therapeutic Exercise: ?NuStep lvl 4 UE/LE x 8  ?SL open book 10x  B ?B hip fallouts against GTB  ?Curl ups 15x2 ?Bridge 2x15 GTB ?Deadlifts with 10# KB, 10x alternating with and without weight, emphasizing body mechanics ? ?[facility] Adult PT Treatment:                                                DATE: 31//23 ?Therapeutic Exercise: ?NuStep lvl 4 UE/LE x 8  ?Modified thomas stretch 30s x2 B ?S/L clamshell 2x15 GTB ?Bridge 2x15 - 3" hold w/ ball squeeze ?STS 2x10 holding 5# KB - from elevated table with OH reach/outstretched arms for postural emphasis ?Modified side planks 2x15" ?Paloff press 2x10 7# each way ?Manual Therapy: ?Trigger point release to R piriformis ?Manual stretch to R hip ER/ER ? ?PATIENT EDUCATION:  ?Education details: eval findings, FOTO, HEP, POC ?Person educated: Patient ?Education method: Explanation, Demonstration, and Handouts ?Education comprehension: verbalized understanding and returned demonstration ?  ?  ?HOME EXERCISE PROGRAM: ?Access Code: [email or web address] ?URL: [email or web address] ?Date: 06/20/2021 ?Prepared by: Octavio Manns ?  ?Exercises ?Supine Bridge - 1 x daily - 7 x weekly - 2 sets - 10 reps - 3 sec hold ?Supine Piriformis Stretch with Leg Straight - 1 x daily - 7 x weekly - 2 sets - 20 sec hold ?Hooklying Clamshell with Resistance - 1 x daily - 7 x weekly - 3 sets - 10 reps ?Modified Thomas Stretch - 1 x daily - 7 x weekly - 2 reps - 60 sec hold ?  ?  ?ASSESSMENT:  Arrives to PT with c/o of central low back pain w/o radiation, attempted to address pain driver and pain and symptoms exacerbated by resisted abduction especially with gluteus medius isolation as weakness led to recruitment of paraspinals.  Difficulty maintaining side planks with hip extension due to weakness.   Hip flexibility appears good but abduction weakness stands out. ? ?  ?GOALS: ?Goals reviewed with patient? No ?  ?SHORT TERM GOALS: ?  ?STG Name Target Date Goal status  ?1 Pt will be compliant and knowledgeable with initial HEP for improved comfort and  carryover ?Baseline: initial HEP given 07/11/2021 MET  ?2 Pt will self report lower back pain no greater than 6/10 for improved comfort and functional ability ?Baseline: 9/10 at worst 07/11/2021 MET  ?  ?  LONG TERM GOALS:  ?  ?LTG Name Target Date Goal status  ?1 Pt will self report lower back pain no greater than 2/10 for improved comfort and functional ability ?Baseline: 9/10 at worst 08/15/2021 INITIAL  ?2 Pt will improve FOTO function score to no less than 63% as proxy for functional improvement ?Baseline: 47% function 08/15/2021 INITIAL  ?3 Pt will increase reps in 30 Second Sit to Stand by no less than 8 reps for improved functional mobility ?Baseline: 5 reps 08/15/2021 INITIAL  ?4 Pt will be able lift and deadlift 45# to improve functional ability to lift her children and perform childcare duties ?Baseline: unable 08/15/2021 INITIAL  ?  ?PLAN: ?PT FREQUENCY: 1-2x/week ?  ?PT DURATION: 8 weeks ?  ?PLANNED INTERVENTIONS: Therapeutic exercises, Therapeutic activity, Neuro Muscular re-education, Balance training, Gait training, Patient/Family education, Joint mobilization, Aquatic Therapy, Dry Needling, Cryotherapy, Moist heat, Ultrasound, and Manual therapy ?  ?PLAN FOR NEXT SESSION: assess HEP, core and proximal hip strengthening, continue B hip stretch and strength, postural control with functional tasks, emphasize squatting with proper body mechanics, abduction strengthening ? ? ? ?Lanice Shirts, PT ?07/22/2021, 5:12 PM ? ?  ? ?

## 2021-07-24 ENCOUNTER — Ambulatory Visit: Payer: Self-pay

## 2021-07-24 ENCOUNTER — Other Ambulatory Visit: Payer: Self-pay

## 2021-07-24 DIAGNOSIS — G8929 Other chronic pain: Secondary | ICD-10-CM

## 2021-07-24 DIAGNOSIS — M545 Low back pain, unspecified: Secondary | ICD-10-CM | POA: Diagnosis not present

## 2021-07-24 DIAGNOSIS — M6281 Muscle weakness (generalized): Secondary | ICD-10-CM

## 2021-07-24 NOTE — Therapy (Signed)
?OUTPATIENT PHYSICAL THERAPY TREATMENT NOTE ? ? ?Patient Name: Kari Ortega ?MRN: [id number] ?DOB:[birth year], 41 y.o., female ?Today's Date: 07/24/2021 ? ?PCP: Ladell Pier, MD ?REFERRING PROVIDER: Aundra Dubin, PA-C ? ? PT End of Session - 07/24/21 1706   ? ? Visit Number 9   ? Number of Visits 17   ? Date for PT Re-Evaluation 08/15/21   ? Authorization Type Med Pay   ? PT Start Time 4098   ? PT Stop Time 1191   ? PT Time Calculation (min) 40 min   ? Activity Tolerance Patient tolerated treatment well;No increased pain   ? Behavior During Therapy [facility] for tasks assessed/performed   ? ?  ?  ? ?  ? ? ? ? ? ?Past Medical History:  ?Diagnosis Date  ? Anemia   ? Facial paralysis   ? 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  Unable to determine reason per pt.    ? Irregular periods   ? ?Past Surgical History:  ?Procedure Laterality Date  ? CHOLECYSTECTOMY N/A 06/14/2018  ? Procedure: LAPAROSCOPIC CHOLECYSTECTOMY;  Surgeon: [name], [name], MD;  Location: [place];  Service: General;  Laterality: N/A;  ? TOOTH EXTRACTION    ? ?Patient Active Problem List  ? Diagnosis Date Noted  ? Bulge of lumbar disc without myelopathy 05/24/2021  ? Carpal tunnel syndrome of right wrist 11/10/2019  ? Symptomatic cholelithiasis 06/13/2018  ? Postpartum care and examination 04/29/2018  ? Urine frequency 04/29/2018  ? Normal labor 03/18/2018  ? 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 BMI 30-39.9 09/21/2017  ? Obesity in pregnancy 09/21/2017  ? Language barrier 09/21/2017  ? Supervision of high risk pregnancy, antepartum 08/24/2017  ? AMA (advanced maternal age) multigravida 35+ 08/24/2017  ? HYPERTRIGLYCERIDEMIA 12/15/2006  ? ? ?REFERRING DIAG:  ?M54.50,G89.29 (ICD-10-CM) - Chronic low back pain, unspecified back pain laterality, unspecified whether sciatica present ? ?THERAPY DIAG:  ?Chronic bilateral low back pain, unspecified whether sciatica present ? ?Muscle weakness (generalized) ? ?PERTINENT HISTORY:   ?None ? ?PRECAUTIONS:  ?None ? ?SUBJECTIVE: Still reporting central low back pain along with B hip soreness, denies radiating symptoms ? ?Pain: ?Are you having pain? Yes ?NPRS: 5/10 ?Pain Location: lower back ?Pain location: lower back, bilateral hips, R>L  ?PAIN TYPE: aching and burning ?Pain description: intermittent  ?Aggravating factors: prolonged sitting, walking,  ?Relieving factors: exercise, rest ? ? ?OBJECTIVE:  ?   ?PATIENT SURVEYS:  ?FOTO 47% function; 63% predicted; 07/15/21 FOTO 65% ?  ?LUMBARAROM/PROM ?  ?A/PROM A/PROM  ?06/20/2021  ?Flexion Increased pain; motion reduced  ?Extension No pain; reduced motion  ? (Blank rows = not tested) ?  ?  ?LUMBAR SPECIAL TESTS:  ?Straight leg raise test: Positive Right ?  ?FUNCTIONAL TESTS:  ?30 Second Sit to Stand: 5 reps ?  ?GAIT: ?Distance walked: 26ft ?Assistive device utilized: None ?Level of assistance: Complete Independence ?Comments: decreased trunk extension; slowed gait speed ?  ?TODAY'S TREATMENT  ?[facility] Adult PT Treatment:                                                DATE: 07/24/21 ?Therapeutic Exercise: ?NuStep lvl 5 UE/LE x 6 min ?SL open book 10x each ?Bridge 2x15 GTB ?SL clams 30x B GTB ?Supine bicycling 30s x2 ?SL abduction with iliac crests stabilized to prevent SB ?Squats with and w/o 10#  KB 10x ?Deadlift with and w/o 10# KB ? ?[facility] Adult PT Treatment:                                                DATE: 07/22/21 ?Therapeutic Exercise: ?NuStep lvl 5 UE/LE x 6 min ?SL open book 10x each ?Bridge 2x15 GTB ?Single leg bridge 10x B ?Assessed for functional leg length discrepancy in supine, none noted ?SL clams 10x B against manual resistance to assess pain driver ?SL abduction 10x B against manual resistance to assess pain driver ?SL abduction in hip extension 10x B against manual resistance to assess pain driver ? ?[facility] Adult PT Treatment:                                                DATE: 07/17/21 ?Therapeutic Exercise: ?NuStep lvl 5 UE/LE x 3 min ?SL  open book 10x each ?Supine clamshell 2x20 GTB ?90/90 table top hold 3x10"  ?Bridge 2x15 GTB ?Modified side plank 3x20" each ?Deadlifts with 3x10 10# KB ? ? ? ?PATIENT EDUCATION:  ?Education details: eval findings, FOTO, HEP, POC ?Person educated: Patient ?Education method: Explanation, Demonstration, and Handouts ?Education comprehension: verbalized understanding and returned demonstration ?  ?  ?HOME EXERCISE PROGRAM: ?Access Code: [email or web address] ?URL: [email or web address] ?Date: 06/20/2021 ?Prepared by: Octavio Manns ?  ?Exercises ?Supine Bridge - 1 x daily - 7 x weekly - 2 sets - 10 reps - 3 sec hold ?Supine Piriformis Stretch with Leg Straight - 1 x daily - 7 x weekly - 2 sets - 20 sec hold ?Hooklying Clamshell with Resistance - 1 x daily - 7 x weekly - 3 sets - 10 reps ?Modified Thomas Stretch - 1 x daily - 7 x weekly - 2 reps - 60 sec hold ?  ?  ?ASSESSMENT:  Continued trunk flexibility and core strengthening, B hip strengthening focus on abduction, B hip strengthening to fatigue, review of lifting and squatting mechanics using KB ? ?  ?GOALS: ?Goals reviewed with patient? No ?  ?SHORT TERM GOALS: ?  ?STG Name Target Date Goal status  ?1 Pt will be compliant and knowledgeable with initial HEP for improved comfort and carryover ?Baseline: initial HEP given 07/11/2021 MET  ?2 Pt will self report lower back pain no greater than 6/10 for improved comfort and functional ability ?Baseline: 9/10 at worst 07/11/2021 MET  ?  ?LONG TERM GOALS:  ?  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  ?PLAN: ?PT FREQUENCY: 1-2x/week ?  ?PT DURATION: 8 weeks ?  ?PLANNED INTERVENTIONS: Therapeutic exercises, Therapeutic activity, Neuro Muscular re-education, Balance training, Gait training, Patient/Family education, Joint mobilization, Aquatic Therapy,  Dry Needling, Cryotherapy, Moist heat, Ultrasound, and Manual therapy ?  ?PLAN FOR NEXT SESSION: core and proximal hip strengthening, continue B hip stretch and strength, postural control with functional tasks, emphasize squatting with proper body mechanics, abduction strengthening isolating trunk to limit paraspinal recruitment ? ? ? ?Lanice Shirts, PT ?07/24/2021, 5:08 PM ? ?  ? ?

## 2021-07-29 ENCOUNTER — Ambulatory Visit: Payer: Self-pay

## 2021-07-29 ENCOUNTER — Other Ambulatory Visit: Payer: Self-pay

## 2021-07-29 DIAGNOSIS — M6281 Muscle weakness (generalized): Secondary | ICD-10-CM

## 2021-07-29 DIAGNOSIS — M545 Low back pain, unspecified: Secondary | ICD-10-CM | POA: Diagnosis not present

## 2021-07-29 DIAGNOSIS — R2689 Other abnormalities of gait and mobility: Secondary | ICD-10-CM

## 2021-07-29 NOTE — Therapy (Signed)
?OUTPATIENT PHYSICAL THERAPY TREATMENT NOTE/10th VISIT PROGRESS NOTE ? ? ?Patient Name: Kari Ortega ?MRN: [id number] ?DOB:08/02/1980, 41 y.o., female ?Today's Date: 07/29/2021 ? ?PCP: Ladell Pier, MD ?REFERRING PROVIDER: Ladell Pier, MD ?Physical Therapy Progress Note ?  ?Dates of Reporting Period:06/20/21-07/29/21 ? ?See Note below for Objective Data and Assessment of Progress/Goals. ? ? PT End of Session - 07/29/21 1708   ? ? Visit Number 10   ? Number of Visits 17   ? Date for PT Re-Evaluation 08/15/21   ? Authorization Type Med Pay   ? PT Start Time 1710   ? PT Stop Time 1750   ? PT Time Calculation (min) 40 min   ? Activity Tolerance Patient tolerated treatment well;No increased pain   ? Behavior During Therapy [facility] for tasks assessed/performed   ? ?  ?  ? ?  ? ? ? ? ? ?Past Medical History:  ?Diagnosis Date  ? Anemia   ? Facial paralysis   ? 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  Unable to determine reason per pt.    ? Irregular periods   ? ?Past Surgical History:  ?Procedure Laterality Date  ? CHOLECYSTECTOMY N/A 06/14/2018  ? Procedure: LAPAROSCOPIC CHOLECYSTECTOMY;  Surgeon: [name], [name], MD;  Location: [place];  Service: General;  Laterality: N/A;  ? TOOTH EXTRACTION    ? ?Patient Active Problem List  ? Diagnosis Date Noted  ? Bulge of lumbar disc without myelopathy 05/24/2021  ? Carpal tunnel syndrome of right wrist 11/10/2019  ? Symptomatic cholelithiasis 06/13/2018  ? Postpartum care and examination 04/29/2018  ? Urine frequency 04/29/2018  ? Normal labor 03/18/2018  ? 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 BMI 30-39.9 09/21/2017  ? Obesity in pregnancy 09/21/2017  ? Language barrier 09/21/2017  ? Supervision of high risk pregnancy, antepartum 08/24/2017  ? AMA (advanced maternal age) multigravida 35+ 08/24/2017  ? HYPERTRIGLYCERIDEMIA 12/15/2006  ? ? ?REFERRING DIAG:  ?M54.50,G89.29 (ICD-10-CM) - Chronic low back pain, unspecified back pain laterality, unspecified  whether sciatica present ? ?THERAPY DIAG:  ?Chronic bilateral low back pain, unspecified whether sciatica present ? ?Muscle weakness (generalized) ? ?Other abnormalities of gait and mobility ? ?PERTINENT HISTORY:  ?None ? ?PRECAUTIONS:  ?None ? ?SUBJECTIVE: Persistent pain in low back and lateral hip joints, worst pain noted when bending over putting her shoes on, localized to central low back ? ?Pain: ?Are you having pain? Yes ?NPRS: 5/10 back, 3/10 hips ?Pain Location: lower back ?Pain location: lower back, bilateral hips, R>L  ?PAIN TYPE: aching and burning ?Pain description: intermittent  ?Aggravating factors: prolonged sitting, walking,  ?Relieving factors: exercise, rest ? ? ?OBJECTIVE:  ?   ?PATIENT SURVEYS:  ?FOTO 47% function; 63% predicted; 07/15/21 FOTO 65% ?  ?LUMBARAROM/PROM ?  ?A/PROM A/PROM  ?06/20/2021  ?Flexion Increased pain; motion reduced  ?Extension No pain; reduced motion  ? (Blank rows = not tested) ?  ?  ?LUMBAR SPECIAL TESTS:  ?Straight leg raise test: Positive Right ?  ?FUNCTIONAL TESTS:  ?30 Second Sit to Stand: 5 reps ?  ?GAIT: ?Distance walked: 38ft ?Assistive device utilized: None ?Level of assistance: Complete Independence ?Comments: decreased trunk extension; slowed gait speed ?  ?TODAY'S TREATMENT  ?[facility] Adult PT Treatment:  DATE: 07/29/21 ?Therapeutic Exercise: ?NuStep lvl 5 UE/LE x 8 min ?SL open book 10x each ?Bridge 20x BTB ?SL clams 20x B BTB ?Supine bicycling 30s x2 ?SL abduction with iliac crests stabilized to prevent SB in spine, 15x B ?45# deadlift unable to perform due to 7/10 low back pain  ?Prone prop 30s ?Prone press 10x ?Supine hip fallouts BTB 20x ? ?[facility] Adult PT Treatment:                                                DATE: 07/24/21 ?Therapeutic Exercise: ?NuStep lvl 5 UE/LE x 6 min ?SL open book 10x each ?Bridge 2x15 GTB ?SL clams 30x B GTB ?Supine bicycling 30s x2 ?SL abduction with iliac crests stabilized to prevent  SB ?Squats with and w/o 10# KB 10x ?Deadlift with and w/o 10# KB ? ?[facility] Adult PT Treatment:                                                DATE: 07/22/21 ?Therapeutic Exercise: ?NuStep lvl 5 UE/LE x 6 min ?SL open book 10x each ?Bridge 2x15 GTB ?Single leg bridge 10x B ?Assessed for functional leg length discrepancy in supine, none noted ?SL clams 10x B against manual resistance to assess pain driver ?SL abduction 10x B against manual resistance to assess pain driver ?SL abduction in hip extension 10x B against manual resistance to assess pain driver ? ?[facility] Adult PT Treatment:                                                DATE: 07/17/21 ?Therapeutic Exercise: ?NuStep lvl 5 UE/LE x 3 min ?SL open book 10x each ?Supine clamshell 2x20 GTB ?90/90 table top hold 3x10"  ?Bridge 2x15 GTB ?Modified side plank 3x20" each ?Deadlifts with 3x10 10# KB ? ? ? ?PATIENT EDUCATION:  ?Education details: eval findings, FOTO, HEP, POC ?Person educated: Patient ?Education method: Explanation, Demonstration, and Handouts ?Education comprehension: verbalized understanding and returned demonstration ?  ?  ?HOME EXERCISE PROGRAM: ?Access Code: [email or web address] ?URL: [email or web address] ?Date: 06/20/2021 ?Prepared by: Octavio Manns ?  ?Exercises ?Supine Bridge - 1 x daily - 7 x weekly - 2 sets - 10 reps - 3 sec hold ?Supine Piriformis Stretch with Leg Straight - 1 x daily - 7 x weekly - 2 sets - 20 sec hold ?Hooklying Clamshell with Resistance - 1 x daily - 7 x weekly - 3 sets - 10 reps ?Modified Thomas Stretch - 1 x daily - 7 x weekly - 2 reps - 60 sec hold ?  ?  ?ASSESSMENT:  Assessed LTGs for 10th visit progress note, all STGs met.  Pain has lessened, B hip strength improved, able to demonstrate good lifting and squatting mechanics.  Goals of pain and ability to lift 45# still remaining. ? ?  ?GOALS: ?Goals reviewed with patient? No ?  ?SHORT TERM GOALS: ?  ?STG Name Target Date Goal status  ?1 Pt will be compliant and  knowledgeable with initial HEP for improved comfort and carryover ?Baseline: initial HEP given 07/11/2021 MET  ?2 Pt will self  report lower back pain no greater than 6/10 for improved comfort and functional ability ?Baseline: 9/10 at worst 07/11/2021 MET  ?  ?LONG TERM GOALS:  ?  ?LTG Name Target Date Goal status  ?1 Pt will self report lower back pain no greater than 2/10 for improved comfort and functional ability ?Baseline: 9/10 at worst, 07/29/21 5/10 low back pain 08/15/2021 Ongoing  ?2 Pt will improve FOTO function score to no less than 63% as proxy for functional improvement ?Baseline: 47% function, 65% at previous session 08/15/2021 Met  ?3 Pt will increase reps in 30 Second Sit to Stand by no less than 8 reps for improved functional mobility ?Baseline: 5 reps; 08/01/21 30 s STS 11 reps 08/15/2021 Met  ?4 Pt will be able lift and deadlift 45# to improve functional ability 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  ?PLAN: ?PT FREQUENCY: 1-2x/week ?  ?PT DURATION: 8 weeks ?  ?PLANNED INTERVENTIONS: Therapeutic exercises, Therapeutic activity, Neuro Muscular re-education, Balance training, Gait training, Patient/Family education, Joint mobilization, Aquatic Therapy, Dry Needling, Cryotherapy, Moist heat, Ultrasound, and Manual therapy ?  ?PLAN FOR NEXT SESSION: core and proximal hip strengthening, continue B hip stretch and strength, postural control with functional tasks, emphasize squatting with proper body mechanics, abduction strengthening isolating trunk to limit paraspinal recruitment, add lumbar extension tasks. ? ? ? ?Lanice Shirts, PT ?07/29/2021, 5:09 PM ? ?  ? ?

## 2021-07-31 ENCOUNTER — Encounter: Payer: Self-pay | Admitting: Physical Therapy

## 2021-07-31 ENCOUNTER — Ambulatory Visit: Payer: Self-pay | Admitting: Physical Therapy

## 2021-07-31 ENCOUNTER — Other Ambulatory Visit: Payer: Self-pay

## 2021-07-31 DIAGNOSIS — M6281 Muscle weakness (generalized): Secondary | ICD-10-CM

## 2021-07-31 DIAGNOSIS — R2689 Other abnormalities of gait and mobility: Secondary | ICD-10-CM

## 2021-07-31 DIAGNOSIS — M545 Low back pain, unspecified: Secondary | ICD-10-CM | POA: Diagnosis not present

## 2021-07-31 DIAGNOSIS — G8929 Other chronic pain: Secondary | ICD-10-CM

## 2021-07-31 DIAGNOSIS — M79671 Pain in right foot: Secondary | ICD-10-CM

## 2021-07-31 DIAGNOSIS — M5412 Radiculopathy, cervical region: Secondary | ICD-10-CM

## 2021-07-31 NOTE — Therapy (Signed)
?OUTPATIENT PHYSICAL THERAPY TREATMENT NOTE/10th VISIT PROGRESS NOTE ? ? ?Patient Name: Kari Ortega ?MRN: [id number] ?DOB:April 08, 1981, 41 y.o., female ?Today's Date: 07/31/2021 ? ?PCP: Kari Pier, MD ?REFERRING PROVIDER: Aundra Dubin, PA-C ?Physical Therapy Progress Note ?  ?Dates of Reporting Period:06/20/21-07/29/21 ? ?See Note below for Objective Data and Assessment of Progress/Goals. ? ? PT End of Session - 07/31/21 1705   ? ? Visit Number 11   ? Number of Visits 17   ? Date for PT Re-Evaluation 08/15/21   ? Authorization Type Med Pay   ? PT Start Time 6629   ? PT Stop Time 4765   ? PT Time Calculation (min) 40 min   ? Activity Tolerance Patient tolerated treatment well;No increased pain   ? Behavior During Therapy [facility] for tasks assessed/performed   ? ?  ?  ? ?  ? ? ? ? ? ?Past Medical History:  ?Diagnosis Date  ? Anemia   ? Facial paralysis   ? 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  Unable to determine reason per pt.    ? Irregular periods   ? ?Past Surgical History:  ?Procedure Laterality Date  ? CHOLECYSTECTOMY N/A 06/14/2018  ? Procedure: LAPAROSCOPIC CHOLECYSTECTOMY;  Surgeon: Kari Ortega, Kari Bruce, MD;  Location: [place];  Service: General;  Laterality: N/A;  ? TOOTH EXTRACTION    ? ?Patient Active Problem List  ? Diagnosis Date Noted  ? Bulge of lumbar disc without myelopathy 05/24/2021  ? Carpal tunnel syndrome of right wrist 11/10/2019  ? Symptomatic cholelithiasis 06/13/2018  ? Postpartum care and examination 04/29/2018  ? Urine frequency 04/29/2018  ? Normal labor 03/18/2018  ? 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 BMI 30-39.9 09/21/2017  ? Obesity in pregnancy 09/21/2017  ? Language barrier 09/21/2017  ? Supervision of high risk pregnancy, antepartum 08/24/2017  ? AMA (advanced maternal age) multigravida 35+ 08/24/2017  ? HYPERTRIGLYCERIDEMIA 12/15/2006  ? ? ?REFERRING DIAG:  ?M54.50,G89.29 (ICD-10-CM) - Chronic low back pain, unspecified back pain laterality, unspecified  whether sciatica present ? ?THERAPY DIAG:  ?Chronic bilateral low back pain, unspecified whether sciatica present ? ?Muscle weakness (generalized) ? ?Other abnormalities of gait and mobility ? ?Pain in right foot ? ?Radiculopathy of cervical spine ? ?PERTINENT HISTORY:  ?None ? ?PRECAUTIONS:  ?None ? ?SUBJECTIVE:  ? ?Pt reports that her back is feeling a little better. ? ?Pain: ?Are you having pain? Yes ?NPRS: 4/10 back, 3/10 hips ?Pain Location: lower back ?Pain location: lower back, bilateral hips, R>L  ?PAIN TYPE: aching and burning ?Pain description: intermittent  ?Aggravating factors: prolonged sitting, walking,  ?Relieving factors: exercise, rest ? ? ?OBJECTIVE:  ?   ?PATIENT SURVEYS:  ?FOTO 47% function; 63% predicted; 07/15/21 FOTO 65% ?  ?LUMBARAROM/PROM ?  ?A/PROM A/PROM  ?06/20/2021  ?Flexion Increased pain; motion reduced  ?Extension No pain; reduced motion  ? (Blank rows = not tested) ?  ?  ?LUMBAR SPECIAL TESTS:  ?Straight leg raise test: Positive Right ?  ?FUNCTIONAL TESTS:  ?30 Second Sit to Stand: 5 reps ?  ?GAIT: ?Distance walked: 92ft ?Assistive device utilized: None ?Level of assistance: Complete Independence ?Comments: decreased trunk extension; slowed gait speed ?  ?TODAY'S TREATMENT  ? ?[facility] Adult PT Treatment:  DATE: 07/31/21 ?Therapeutic Exercise: ?NuStep lvl 6 UE/LE x 5 min ?SL open book 10x each ?Bridge 10'' holds 2x5 ?SL clams 2x10 B BTB ?Supine bicycling 30s x2 ?SL abduction with iliac crests stabilized to prevent SB in spine, 15x B ?25# deadlift  - 3x5  ?Bird dog - 3x10 ?Supine hip fallouts BTB 20x ? ?[facility]:                                                DATE: 07/29/21 ?Therapeutic Exercise: ?NuStep lvl 5 UE/LE x 8 min ?SL open book 10x each ?Bridge 20x BTB ?SL clams 20x B BTB ?Supine bicycling 30s x2 ?SL abduction with iliac crests stabilized to prevent SB in spine, 15x B ?45# deadlift unable to perform due to 7/10 low back pain   ?Prone prop 30s ?Prone press 10x ?Supine hip fallouts BTB 20x ? ?[facility] Adult PT Treatment:                                                DATE: 07/24/21 ?Therapeutic Exercise: ?NuStep lvl 5 UE/LE x 6 min ?SL open book 10x each ?Bridge 2x15 GTB ?SL clams 30x B GTB ?Supine bicycling 30s x2 ?SL abduction with iliac crests stabilized to prevent SB ?Squats with and w/o 10# KB 10x ?Deadlift with and w/o 10# KB ? ?[facility] Adult PT Treatment:                                                DATE: 07/22/21 ?Therapeutic Exercise: ?NuStep lvl 5 UE/LE x 6 min ?SL open book 10x each ?Bridge 2x15 GTB ?Single leg bridge 10x B ?Assessed for functional leg length discrepancy in supine, none noted ?SL clams 10x B against manual resistance to assess pain driver ?SL abduction 10x B against manual resistance to assess pain driver ?SL abduction in hip extension 10x B against manual resistance to assess pain driver ? ?[facility] Adult PT Treatment:                                                DATE: 07/17/21 ?Therapeutic Exercise: ?NuStep lvl 5 UE/LE x 3 min ?SL open book 10x each ?Supine clamshell 2x20 GTB ?90/90 table top hold 3x10"  ?Bridge 2x15 GTB ?Modified side plank 3x20" each ?Deadlifts with 3x10 10# KB ? ? ? ?PATIENT EDUCATION:  ?Education details: eval findings, FOTO, HEP, POC ?Person educated: Patient ?Education method: Explanation, Demonstration, and Handouts ?Education comprehension: verbalized understanding and returned demonstration ?  ?  ?HOME EXERCISE PROGRAM: ?Access Code: [email or web address] ?URL: [email or web address] ?Date: 06/20/2021 ?Prepared by: Octavio Manns ?  ?Exercises ?Supine Bridge - 1 x daily - 7 x weekly - 2 sets - 10 reps - 3 sec hold ?Supine Piriformis Stretch with Leg Straight - 1 x daily - 7 x weekly - 2 sets - 20 sec hold ?Hooklying Clamshell with Resistance - 1 x daily - 7 x weekly - 3 sets - 10 reps ?Modified Marcello Moores  Stretch - 1 x daily - 7 x weekly - 2 reps - 60 sec hold ?  ?  ?ASSESSMENT:   ?[name] is doing  well overall.  She has lower baseline pain today.  She continues to to show LE and core strength deficit, but this is improving.  I tried to explain muscle pain when working vs harmful pain with some success.  We will continue progressing as able. ? ?  ?GOALS: ?Goals reviewed with patient? No ?  ?SHORT TERM GOALS: ?  ?STG Name Target Date Goal status  ?1 Pt will be compliant and knowledgeable with initial HEP for improved comfort and carryover ?Baseline: initial HEP given 07/11/2021 MET  ?2 Pt will self report lower back pain no greater than 6/10 for improved comfort and functional ability ?Baseline: 9/10 at worst 07/11/2021 MET  ?  ?LONG TERM GOALS:  ?  ?LTG Name Target Date Goal status  ?1 Pt will self report lower back pain no greater than 2/10 for improved comfort and functional ability ?Baseline: 9/10 at worst, 07/29/21 5/10 low back pain 08/15/2021 Ongoing  ?2 Pt will improve FOTO function score to no less than 63% as proxy for functional improvement ?Baseline: 47% function, 65% at previous session 08/15/2021 Met  ?3 Pt will increase reps in 30 Second Sit to Stand by no less than 8 reps for improved functional mobility ?Baseline: 5 reps; 08/01/21 30 s STS 11 reps 08/15/2021 Met  ?4 Pt will be able lift and deadlift 45# to improve functional ability 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  ?PLAN: ?PT FREQUENCY: 1-2x/week ?  ?PT DURATION: 8 weeks ?  ?PLANNED INTERVENTIONS: Therapeutic exercises, Therapeutic activity, Neuro Muscular re-education, Balance training, Gait training, Patient/Family education, Joint mobilization, Aquatic Therapy, Dry Needling, Cryotherapy, Moist heat, Ultrasound, and Manual therapy ?  ?PLAN FOR NEXT SESSION: core and proximal hip strengthening, continue B hip stretch and strength, postural control with functional tasks, emphasize squatting with proper body mechanics, abduction strengthening isolating trunk  to limit paraspinal recruitment, add lumbar extension tasks. ? ? ? ?[name] Dad, PT ?07/31/2021, 5:46 PM ? ?  ? ?

## 2021-08-05 ENCOUNTER — Ambulatory Visit: Payer: Self-pay

## 2021-08-05 ENCOUNTER — Other Ambulatory Visit: Payer: Self-pay

## 2021-08-05 DIAGNOSIS — M6281 Muscle weakness (generalized): Secondary | ICD-10-CM

## 2021-08-05 DIAGNOSIS — M545 Low back pain, unspecified: Secondary | ICD-10-CM | POA: Diagnosis not present

## 2021-08-05 NOTE — Therapy (Signed)
?OUTPATIENT PHYSICAL THERAPY TREATMENT NOTE/10th VISIT PROGRESS NOTE ? ? ?Patient Name: Kari Ortega ?MRN: [id number] ?DOB:Feb 12, 1981, 41 y.o., female ?Today's Date: 08/05/2021 ? ?PCP: Ladell Pier, MD ?REFERRING PROVIDER: Ladell Pier, MD ?Physical Therapy Progress Note ?  ?Dates of Reporting Period:06/20/21-07/29/21 ? ?See Note below for Objective Data and Assessment of Progress/Goals. ? ? PT End of Session - 08/05/21 1708   ? ? Visit Number 12   ? Number of Visits 17   ? Date for PT Re-Evaluation 08/15/21   ? Authorization Type Med Pay   ? PT Start Time 4098   ? PT Stop Time 1191   ? PT Time Calculation (min) 40 min   ? Activity Tolerance Patient tolerated treatment well;No increased pain   ? Behavior During Therapy [facility] for tasks assessed/performed   ? ?  ?  ? ?  ? ? ? ? ? ?Past Medical History:  ?Diagnosis Date  ? Anemia   ? Facial paralysis   ? 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  Unable to determine reason per pt.    ? Irregular periods   ? ?Past Surgical History:  ?Procedure Laterality Date  ? CHOLECYSTECTOMY N/A 06/14/2018  ? Procedure: LAPAROSCOPIC CHOLECYSTECTOMY;  Surgeon: [name], [name], MD;  Location: [place];  Service: General;  Laterality: N/A;  ? TOOTH EXTRACTION    ? ?Patient Active Problem List  ? Diagnosis Date Noted  ? Bulge of lumbar disc without myelopathy 05/24/2021  ? Carpal tunnel syndrome of right wrist 11/10/2019  ? Symptomatic cholelithiasis 06/13/2018  ? Postpartum care and examination 04/29/2018  ? Urine frequency 04/29/2018  ? Normal labor 03/18/2018  ? 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 BMI 30-39.9 09/21/2017  ? Obesity in pregnancy 09/21/2017  ? Language barrier 09/21/2017  ? Supervision of high risk pregnancy, antepartum 08/24/2017  ? AMA (advanced maternal age) multigravida 35+ 08/24/2017  ? HYPERTRIGLYCERIDEMIA 12/15/2006  ? ? ?REFERRING DIAG:  ?M54.50,G89.29 (ICD-10-CM) - Chronic low back pain, unspecified back pain laterality, unspecified  whether sciatica present ? ?THERAPY DIAG:  ?Chronic bilateral low back pain, unspecified whether sciatica present ? ?Muscle weakness (generalized) ? ?PERTINENT HISTORY:  ?None ? ?PRECAUTIONS:  ?None ? ?SUBJECTIVE:  ? ?Pt reports that her back is feeling a little better.  Still reports throbbing pain with bending, managed with lying down.  Hip pain reported as constant now ? ?Pain: ?Are you having pain? Yes ?NPRS: 4/10 back, 3/10 hips ?Pain Location: lower back ?Pain location: lower back, bilateral hips, R>L  ?PAIN TYPE: aching and burning ?Pain description: intermittent  ?Aggravating factors: prolonged sitting, walking,  ?Relieving factors: exercise, rest ? ? ?OBJECTIVE:  ?   ?PATIENT SURVEYS:  ?FOTO 47% function; 63% predicted; 07/15/21 FOTO 65% ?  ?LUMBARAROM/PROM ?  ?A/PROM A/PROM  ?06/20/2021  ?Flexion Increased pain; motion reduced  ?Extension No pain; reduced motion  ? (Blank rows = not tested) ?  ?  ?LUMBAR SPECIAL TESTS:  ?Straight leg raise test: Positive Right ?  ?FUNCTIONAL TESTS:  ?30 Second Sit to Stand: 5 reps ?  ?GAIT: ?Distance walked: 56ft ?Assistive device utilized: None ?Level of assistance: Complete Independence ?Comments: decreased trunk extension; slowed gait speed ?  ?TODAY'S TREATMENT  ?[facility] Adult PT Treatment:  DATE: 08/05/21 ?Therapeutic Exercise: ?NuStep lvl 6 UE/LE x 8 min ?SL open book 10x each ?Bridge 5'' holds 2x5 ?SL clams 2x10 B BTB ?Supine bicycling 30s x2 ?SL abduction with iliac crests stabilized to prevent SB in spine, 15x B ?10x forward flexion, no symptoms ?R ITB stretch no tightness ?Cross body piriformis stretch 30s x3 B ? ? ?[facility] Adult PT Treatment:                                                DATE: 07/31/21 ?Therapeutic Exercise: ?NuStep lvl 6 UE/LE x 5 min ?SL open book 10x each ?Bridge 10'' holds 2x5 ?SL clams 2x10 B BTB ?Supine bicycling 30s x2 ?SL abduction with iliac crests stabilized to prevent SB in spine, 15x B ?25# deadlift   - 3x5  ?Bird dog - 3x10 ?Supine hip fallouts BTB 20x ? ?[facility] Adult PT Treatment:                                                DATE: 07/29/21 ?Therapeutic Exercise: ?NuStep lvl 5 UE/LE x 8 min ?SL open book 10x each ?Bridge 20x BTB ?SL clams 20x B BTB ?Supine bicycling 30s x2 ?SL abduction with iliac crests stabilized to prevent SB in spine, 15x B ?45# deadlift unable to perform due to 7/10 low back pain  ?Prone prop 30s ?Prone press 10x ?Supine hip fallouts BTB 20x ? ? ?PATIENT EDUCATION:  ?Education details: eval findings, FOTO, HEP, POC ?Person educated: Patient ?Education method: Explanation, Demonstration, and Handouts ?Education comprehension: verbalized understanding and returned demonstration ?  ?  ?HOME EXERCISE PROGRAM: ?Access Code: [email or web address] ?URL: [email or web address] ?Date: 08/05/2021 ?Prepared by: Sharlynn Oliphant ? ?Exercises ?- Supine Bridge  - 1 x daily - 7 x weekly - 2 sets - 10 reps - 3 sec hold ?- Hooklying Clamshell with Resistance  - 1 x daily - 7 x weekly - 3 sets - 10 reps ?- Modified Thomas Stretch  - 1 x daily - 7 x weekly - 2 reps - 60 sec hold ?- Supine Piriformis Stretch with Foot on Ground  - 2 x daily - 7 x weekly - 1 sets - 3 reps - 30s hold ?  ?  ?ASSESSMENT:   ?Reports overall improvement but symptoms persisting in low back and hips.  Now reporting hip pain is constant R>L, issued new piriformis stretch to address.  Less recruitment of paraspinals with abduction in SL indicating increasing strength.  Repetitive flexion asymptomatic suggesting non disk relate pathology ? ?  ?GOALS: ?Goals reviewed with patient? No ?  ?SHORT TERM GOALS: ?  ?STG Name Target Date Goal status  ?1 Pt will be compliant and knowledgeable with initial HEP for improved comfort and carryover ?Baseline: initial HEP given 07/11/2021 MET  ?2 Pt will self report lower back pain no greater than 6/10 for improved comfort and functional ability ?Baseline: 9/10 at worst 07/11/2021 MET  ?  ?LONG TERM GOALS:  ?   ?LTG Name Target Date Goal status  ?1 Pt will self report lower back pain no greater than 2/10 for improved comfort and functional ability ?Baseline: 9/10 at worst, 07/29/21 5/10 low back pain 08/15/2021 Ongoing  ?2 Pt will improve FOTO function score to no  less than 63% as proxy for functional improvement ?Baseline: 47% function, 65% at previous session 08/15/2021 Met  ?3 Pt will increase reps in 30 Second Sit to Stand by no less than 8 reps for improved functional mobility ?Baseline: 5 reps; 08/01/21 30 s STS 11 reps 08/15/2021 Met  ?4 Pt will be able lift and deadlift 45# to improve functional ability 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  ?PLAN: ?PT FREQUENCY: 1-2x/week ?  ?PT DURATION: 8 weeks ?  ?PLANNED INTERVENTIONS: Therapeutic exercises, Therapeutic activity, Neuro Muscular re-education, Balance training, Gait training, Patient/Family education, Joint mobilization, Aquatic Therapy, Dry Needling, Cryotherapy, Moist heat, Ultrasound, and Manual therapy ?  ?PLAN FOR NEXT SESSION: core and proximal hip strengthening, continue B hip stretch and strength, postural control with functional tasks, emphasize squatting with proper body mechanics, abduction strengthening isolating trunk to limit paraspinal recruitment, add lumbar extension tasks. ? ? ? ?Lanice Shirts, PT ?08/05/2021, 5:10 PM ? ?  ? ?

## 2021-08-07 ENCOUNTER — Ambulatory Visit: Payer: Self-pay

## 2021-08-07 DIAGNOSIS — M6281 Muscle weakness (generalized): Secondary | ICD-10-CM

## 2021-08-07 DIAGNOSIS — M545 Low back pain, unspecified: Secondary | ICD-10-CM

## 2021-08-07 NOTE — Therapy (Signed)
?OUTPATIENT PHYSICAL THERAPY TREATMENT NOTE/DISCHARGE ? ? ?Patient Name: Kari Ortega ?MRN: [id number] ?DOB:22-May-1980, 41 y.o., female ?Today's Date: 08/07/2021 ? ?PCP: Ladell Pier, MD ?REFERRING PROVIDER: Aundra Dubin, PA-C ? ? ? PT End of Session - 08/07/21 1705   ? ? Visit Number 13   ? Number of Visits 17   ? Date for PT Re-Evaluation 08/15/21   ? Authorization Type Med Pay   ? PT Start Time 1587   ? PT Stop Time 1740   ? PT Time Calculation (min) 35 min   ? Activity Tolerance Patient tolerated treatment well;No increased pain   ? Behavior During Therapy [facility] for tasks assessed/performed   ? ?  ?  ? ?  ? ? ? ? ? ? ?Past Medical History:  ?Diagnosis Date  ? Anemia   ? Facial paralysis   ? 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  Unable to determine reason per pt.    ? Irregular periods   ? ?Past Surgical History:  ?Procedure Laterality Date  ? CHOLECYSTECTOMY N/A 06/14/2018  ? Procedure: LAPAROSCOPIC CHOLECYSTECTOMY;  Surgeon: [name], [name], MD;  Location: [place];  Service: General;  Laterality: N/A;  ? TOOTH EXTRACTION    ? ?Patient Active Problem List  ? Diagnosis Date Noted  ? Bulge of lumbar disc without myelopathy 05/24/2021  ? Carpal tunnel syndrome of right wrist 11/10/2019  ? Symptomatic cholelithiasis 06/13/2018  ? Postpartum care and examination 04/29/2018  ? Urine frequency 04/29/2018  ? Normal labor 03/18/2018  ? 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 BMI 30-39.9 09/21/2017  ? Obesity in pregnancy 09/21/2017  ? Language barrier 09/21/2017  ? Supervision of high risk pregnancy, antepartum 08/24/2017  ? AMA (advanced maternal age) multigravida 35+ 08/24/2017  ? HYPERTRIGLYCERIDEMIA 12/15/2006  ? ? ?REFERRING DIAG:  ?M54.50,G89.29 (ICD-10-CM) - Chronic low back pain, unspecified back pain laterality, unspecified whether sciatica present ? ?THERAPY DIAG:  ?Chronic bilateral low back pain, unspecified whether sciatica present ? ?Muscle weakness (generalized) ? ?PERTINENT  HISTORY:  ?None ? ?PRECAUTIONS:  ?None ? ?SUBJECTIVE:  ?**Subjective assisted with in-person interpreter** ?Pt presents to PT with no reports of lower back pain. She notes that she still has hip pain, but her back is feeling much better since starting therapy. Pt is ready to begin PT at this time. ? ?Pain: ?Are you having pain? Yes ?NPRS: 3/10 hips ?Pain Location: lower back ?Pain location: lower back, bilateral hips, R>L  ?PAIN TYPE: aching and burning ?Pain description: intermittent  ?Aggravating factors: prolonged sitting, walking,  ?Relieving factors: exercise, rest ? ? ?OBJECTIVE:  ?   ?PATIENT SURVEYS:  ?FOTO 47% function; 63% predicted; 07/15/21 FOTO 65% ?  ?LUMBARAROM/PROM ?  ?A/PROM A/PROM  ?06/20/2021  ?Flexion Increased pain; motion reduced  ?Extension No pain; reduced motion  ? (Blank rows = not tested) ?  ?FUNCTIONAL TESTS:  ?Pt able to deadlift 45# DB ?  ?TODAY'S TREATMENT  ?[facility] Adult PT Treatment:                                                DATE: 08/07/21 ?Therapeutic Exercise: ?NuStep lvl 5 UE/LE x 3 min ?Modified thomas stretch x 60" ?Piriformis stretch R 30"  ?Bridge x 15 BTB around knees ?SL clams 2x10 B BTB each ?Supine bicycling x 30" ?Therapeutic Activity: ?Assessment of tests/measures and goals for discharge ? ?  [facility] Adult PT Treatment:                                                DATE: 08/05/21 ?Therapeutic Exercise: ?NuStep lvl 6 UE/LE x 8 min ?SL open book 10x each ?Bridge 5'' holds 2x5 ?SL clams 2x10 B BTB ?Supine bicycling 30s x2 ?SL abduction with iliac crests stabilized to prevent SB in spine, 15x B ?10x forward flexion, no symptoms ?R ITB stretch no tightness ?Cross body piriformis stretch 30s x3 B ? ?PATIENT EDUCATION:  ?Education details: eval findings, FOTO, HEP, POC ?Person educated: Patient ?Education method: Explanation, Demonstration, and Handouts ?Education comprehension: verbalized understanding and returned demonstration ?  ?  ?HOME EXERCISE PROGRAM: ?Access Code:  [email or web address] ?URL: [email or web address] ?Date: 08/07/2021 ?Prepared by: Octavio Manns ? ?Exercises ?- Modified Thomas Stretch  - 1 x daily - 7 x weekly - 2 reps - 60 sec hold ?- Supine Piriformis Stretch with Leg Straight  - 1 x daily - 7 x weekly - 2 reps - 30 sec hold ?- Supine Bridge with Resistance Band  - 3 x weekly - 3 sets - 15 reps ?- Clamshell with Resistance  - 3 x weekly - 3 sets - 10 reps ?- Sidelying Thoracic Rotation with Open Book  - 3 x weekly - 2 sets - 10 reps - 5 sec hold ?- Side Plank on Knees  - 3 x weekly - 2 reps - 20 sec hold ?- Supine Bicycles  - 3 x weekly - 2 reps - 30 sec hold ?  ?  ?ASSESSMENT:   ?Pt was able to complete all prescribed exercises and demonstrated knowledge of HEP with no adverse effect. She has made very good progress since starting PT treatment, with sharp decline in LBP and improved core and hip strength. She is now able to lift 45# with no increase in pain and has met FOTO score, showing subjective improvement in overall functional ability. Pt should continue to improve with HEP compliance and is ready to discharge from skilled PT at this time. ?  ?GOALS: ?Goals reviewed with patient? No ?  ?SHORT TERM GOALS: ?  ?STG Name Target Date Goal status  ?1 Pt will be compliant and knowledgeable with initial HEP for improved comfort and carryover ?Baseline: initial HEP given 07/11/2021 MET  ?2 Pt will self report lower back pain no greater than 6/10 for improved comfort and functional ability ?Baseline: 9/10 at worst 07/11/2021 MET  ?  ?LONG TERM GOALS:  ?  ?LTG Name Target Date Goal status  ?1 Pt will self report lower back pain no greater than 2/10 for improved comfort and functional ability ?Baseline: 9/10 at worst, 07/29/21 5/10 low back pain 08/15/2021 MET  ?2 Pt will improve FOTO function score to no less than 63% as proxy for functional improvement ?Baseline: 47% function, 65% at previous session 08/15/2021 MET  ?3 Pt will increase reps in 30 Second Sit to Stand by no  less than 8 reps for improved functional mobility ?Baseline: 5 reps; 08/01/21 30 s STS 11 reps 08/15/2021 MET  ?4 Pt will be able lift and deadlift 45# to improve functional ability 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  ?PLAN: ?PT FREQUENCY: 1-2x/week ?  ?PT DURATION: 8 weeks ?  ?PLANNED  INTERVENTIONS: Therapeutic exercises, Therapeutic activity, Neuro Muscular re-education, Balance training, Gait training, Patient/Family education, Joint mobilization, Aquatic Therapy, Dry Needling, Cryotherapy, Moist heat, Ultrasound, and Manual therapy ?  ?PLAN FOR NEXT SESSION: core and proximal hip strengthening, continue B hip stretch and strength, postural control with functional tasks, emphasize squatting with proper body mechanics, abduction strengthening isolating trunk to limit paraspinal recruitment, add lumbar extension tasks. ? ?PHYSICAL THERAPY DISCHARGE SUMMARY ? ?Visits from Start of Care: 13 ? ?Current functional level related to goals / functional outcomes: ?See goals and objective ?  ?Remaining deficits: ?See goals and objective ?  ?Education / Equipment: ?HEP  ? ?Patient agrees to discharge. Patient goals were met. Patient is being discharged due to meeting the stated rehab goals. ? ? ?Ward Chatters, PT ?08/07/2021, 6:23 PM ? ?  ? ?

## 2021-08-08 ENCOUNTER — Ambulatory Visit: Payer: Self-pay | Attending: Internal Medicine

## 2021-08-08 DIAGNOSIS — E278 Other specified disorders of adrenal gland: Secondary | ICD-10-CM

## 2021-08-15 ENCOUNTER — Encounter: Payer: Self-pay | Admitting: Internal Medicine

## 2021-08-15 ENCOUNTER — Ambulatory Visit: Payer: Self-pay | Attending: Internal Medicine | Admitting: Internal Medicine

## 2021-08-15 VITALS — BP 130/82 | HR 82 | Resp 16 | Wt 181.6 lb

## 2021-08-15 DIAGNOSIS — E278 Other specified disorders of adrenal gland: Secondary | ICD-10-CM

## 2021-08-15 DIAGNOSIS — R768 Other specified abnormal immunological findings in serum: Secondary | ICD-10-CM

## 2021-08-15 MED ORDER — AMOXICILLIN 500 MG PO CAPS
1000.0000 mg | ORAL_CAPSULE | Freq: Two times a day (BID) | ORAL | 0 refills | Status: DC
  Filled 2021-08-15: qty 56, 14d supply, fill #0

## 2021-08-15 MED ORDER — OMEPRAZOLE 20 MG PO CPDR
20.0000 mg | DELAYED_RELEASE_CAPSULE | Freq: Two times a day (BID) | ORAL | 0 refills | Status: DC
  Filled 2021-08-15: qty 28, 14d supply, fill #0

## 2021-08-15 MED ORDER — CLARITHROMYCIN 500 MG PO TABS
500.0000 mg | ORAL_TABLET | Freq: Two times a day (BID) | ORAL | 0 refills | Status: DC
  Filled 2021-08-15: qty 28, 14d supply, fill #0

## 2021-08-15 NOTE — Progress Notes (Signed)
? ? ?Patient ID: Kari Ortega, female    DOB: [birth year]  MRN: [id number] ? ?CC: Abdominal Pain ? ? ?Subjective: ?Kari Ortega is a 41 y.o. female who presents for f/u abdominal pain ?Her concerns today include:  ?Pt with hx obesity, HL, pre DM ? ?I last saw patient 2 months ago when she complained of generalized abdominal pain and bloating that had started the night before.  She had mild diffuse tenderness on exam.  CAT scan of the abdomen was done which was negative for any acute findings.  Incidental finding was 1.3 cm left adrenal adenoma.  Subsequently saw NP Raul Del for the same symptoms 2 weeks later.  Tested positive for H. pylori.  Treatment prescribed but patient never picked it up because she starting having issues with left eye swelling for which she was seen by our PA and then made in the emergency room.  Treated as a cellulitis. ? ?Today she reports that she still has feeling of abdominal bloating.  ? ?In regards to the incidentaloma of the left adrenal, I had her do blood tests to screen for over functioning gland.  Renin/angiotensin level still pending.  Metanephrine levels still pending.  She did do the low-dose dexamethasone suppression test to screen for Cushing's and this was positive with cortisol level greater than 1.8.  Her level was 6.  She has abdominal obesity and mild stria on arms ? ? ?Patient Active Problem List  ? Diagnosis Date Noted  ? Bulge of lumbar disc without myelopathy 05/24/2021  ? Carpal tunnel syndrome of right wrist 11/10/2019  ? Symptomatic cholelithiasis 06/13/2018  ? Postpartum care and examination 04/29/2018  ? Urine frequency 04/29/2018  ? Normal labor 03/18/2018  ? 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 BMI 30-39.9 09/21/2017  ? Obesity in pregnancy 09/21/2017  ? Language barrier 09/21/2017  ? Supervision of high risk pregnancy, antepartum 08/24/2017  ? AMA (advanced maternal age) multigravida 35+ 08/24/2017  ? HYPERTRIGLYCERIDEMIA 12/15/2006  ?   ? ?Current Outpatient Medications on File Prior to Visit  ?Medication Sig Dispense Refill  ? acetaminophen (TYLENOL) 500 MG tablet Take 2 tablets (1,000 mg total) by mouth every 6 (six) hours as needed. (Patient taking differently: Take 1,000 mg by mouth every 6 (six) hours as needed for moderate pain or headache.) 30 tablet 0  ? amitriptyline (ELAVIL) 10 MG tablet Take 1 tablet (10 mg total) by mouth at bedtime. (Patient not taking: Reported on 07/11/2021) 30 tablet 5  ? amoxicillin-clavulanate (AUGMENTIN) 875-125 MG tablet Take 1 tablet by mouth 2 (two) times daily. (Patient not taking: Reported on 08/15/2021) 20 tablet 0  ? celecoxib (CELEBREX) 200 MG capsule Take 1 capsule (200 mg total) by mouth daily. 30 capsule 3  ? Cholecalciferol (VITAMIN D-3) 125 MCG (5000 UT) TABS Take 1 tablet by mouth daily. (Patient not taking: Reported on 07/12/2021) 90 tablet 3  ? dexamethasone (DECADRON) 1 MG tablet toma estse tableta a nocha antes de tu cita de lab 1 tablet 0  ? methocarbamol (ROBAXIN) 500 MG tablet Take 1 tablet (500 mg total) by mouth 2 (two) times daily as needed. (Patient not taking: Reported on 07/11/2021) 20 tablet 0  ? miconazole (MICOTIN) 2 % cream Apply to affected area on foot twice a day (Patient not taking: Reported on 07/11/2021) 28.35 g 0  ? mupirocin ointment (BACTROBAN) 2 % Apply 1 application topically 2 (two) times daily. Apply twice daily (Patient not taking: Reported on 07/11/2021) 22 g 0  ? omeprazole (  PRILOSEC) 20 MG capsule Take 1 capsule (20 mg total) by mouth daily. (Patient not taking: Reported on 07/11/2021) 30 capsule 2  ? omeprazole (PRILOSEC) 20 MG capsule Take 1 capsule (20 mg total) by mouth 2 (two) times daily before a meal for 14 days. 28 capsule 0  ? Sunscreen SPF50 LOTN Apply 1 application topically in the morning, at noon, and at bedtime. (Patient not taking: Reported on 07/11/2021) 100 mL 0  ? traMADol (ULTRAM) 50 MG tablet Take 1 tablet (50 mg total) by mouth every 12 (twelve) hours as  needed. (Patient not taking: Reported on 07/11/2021) 30 tablet 2  ? triamcinolone (KENALOG) 0.1 % Apply 1 application topically daily as needed. (Patient not taking: Reported on 07/11/2021) 30 g 0  ? ?Current Facility-Administered Medications on File Prior to Visit  ?Medication Dose Route Frequency Provider Last Rate Last Admin  ? polyethylene glycol powder (GLYCOLAX/MIRALAX) container 255 g  1 Container Oral Once Rasch, Artist Pais, NP      ? ? ?No Known Allergies ? ?Social History  ? ?Socioeconomic History  ? Marital status: Married  ?  Spouse name: Not on file  ? Number of children: Not on file  ? Years of education: Not on file  ? Highest education level: Not on file  ?Occupational History  ? Not on file  ?Tobacco Use  ? Smoking status: Never  ? Smokeless tobacco: Never  ?Vaping Use  ? Vaping Use: Never used  ?Substance and Sexual Activity  ? Alcohol use: No  ? Drug use: No  ? Sexual activity: Yes  ?  Birth control/protection: None  ?Other Topics Concern  ? Not on file  ?Social History Narrative  ? Right handed  ? ?Social Determinants of Health  ? ?Financial Resource Strain: Not on file  ?Food Insecurity: Not on file  ?Transportation Needs: Not on file  ?Physical Activity: Not on file  ?Stress: Not on file  ?Social Connections: Not on file  ?Intimate Partner Violence: Not on file  ? ? ?Family History  ?Problem Relation Age of Onset  ? Diabetes Mother   ? ? ?Past Surgical History:  ?Procedure Laterality Date  ? CHOLECYSTECTOMY N/A 06/14/2018  ? Procedure: LAPAROSCOPIC CHOLECYSTECTOMY;  Surgeon: [name], [name], MD;  Location: [place];  Service: General;  Laterality: N/A;  ? TOOTH EXTRACTION    ? ? ?ROS: ?Review of Systems ?Negative except as stated above ? ?PHYSICAL EXAM: ?BP 130/82   Pulse 82   Resp 16   Wt 181 lb 9.6 oz (82.4 kg)   SpO2 95%   BMI 35.47 kg/m?   ?Physical Exam ? ?General appearance - alert, well appearing, and in no distress ?Mental status - normal mood, behavior, speech, dress, motor activity,  and thought processes ?Abdomen -obese, nondistended, normal bowel sounds.  Mild diffuse tenderness.  No guarding or rebound. ? ? ? ?  Latest Ref Rng & Units 07/11/2021  ?  6:44 PM 07/05/2021  ? 11:05 AM 12/15/2019  ? 10:51 AM  ?CMP  ?Glucose 70 - 99 mg/dL 117   95   85    ?BUN 6 - 20 mg/dL '18   14   12    '$ ?Creatinine 0.44 - 1.00 mg/dL 0.70   0.59   0.59    ?Sodium 135 - 145 mmol/L 141   139   142    ?Potassium 3.5 - 5.1 mmol/L 3.5   4.0   4.2    ?Chloride 98 - 111 mmol/L 104  103   107    ?CO2 20 - 29 mmol/L  24   22    ?Calcium 8.7 - 10.2 mg/dL  8.7   8.6    ?Total Protein 6.0 - 8.5 g/dL  6.8     ?Total Bilirubin 0.0 - 1.2 mg/dL  0.2     ?Alkaline Phos 44 - 121 IU/L  113     ?AST 0 - 40 IU/L  30     ?ALT 0 - 32 IU/L  34     ? ?Lipid Panel  ?   ?Component Value Date/Time  ? CHOL 160 07/05/2021 1105  ? TRIG 239 (H) 07/05/2021 1105  ? HDL 28 (L) 07/05/2021 1105  ? CHOLHDL 5.7 (H) 07/05/2021 1105  ? [place] 91 07/05/2021 1105  ? ? ?CBC ?   ?Component Value Date/Time  ? WBC 9.0 07/05/2021 1105  ? WBC 8.0 06/14/2018 0239  ? RBC 4.43 07/05/2021 1105  ? RBC 4.19 06/14/2018 0239  ? HGB 11.2 (L) 07/11/2021 1844  ? HGB 11.1 07/05/2021 1105  ? HCT 33.0 (L) 07/11/2021 1844  ? HCT 35.7 07/05/2021 1105  ? PLT 299 07/05/2021 1105  ? MCV 81 07/05/2021 1105  ? MCH 25.1 (L) 07/05/2021 1105  ? MCH 27.0 06/14/2018 0239  ? MCHC 31.1 (L) 07/05/2021 1105  ? MCHC 32.1 06/14/2018 0239  ? RDW 14.6 07/05/2021 1105  ? LYMPHSABS 2.5 12/15/2019 1051  ? MONOABS 0.5 07/01/2016 0948  ? EOSABS 0.2 12/15/2019 1051  ? BASOSABS 0.0 12/15/2019 1051  ? ? ?ASSESSMENT AND PLAN: ?1. Helicobacter pylori antibody positive ?We will treat patient with standard therapy to include amoxicillin, clarithromycin and omeprazole for 14 days.  We will recheck H. pylori test after she has been off omeprazole for a month. ?-Of note, patient is breast-feeding.  I did check with up-to-date and also had a clinical pharmacist review to make sure that these 3 medications are okay  to use in a breast-feeding mother. ?- amoxicillin (AMOXIL) 500 MG capsule; Take 2 capsules (1,000 mg total) by mouth 2 (two) times daily.  Dispense: 56 capsule; Refill: 0 ?- clarithromycin (BIAXIN) 500 MG tablet

## 2021-08-16 ENCOUNTER — Other Ambulatory Visit: Payer: Self-pay

## 2021-08-16 LAB — ACTH: ACTH: 17.6 pg/mL (ref 7.2–63.3)

## 2021-08-17 LAB — ALDOSTERONE + RENIN ACTIVITY W/ RATIO
ALDOS/RENIN RATIO: 7.8 (ref 0.0–30.0)
ALDOSTERONE: 21.3 ng/dL (ref 0.0–30.0)
Renin: 2.734 ng/mL/hr (ref 0.167–5.380)

## 2021-08-17 LAB — DHEA-SULFATE: DHEA-SO4: 50.5 ug/dL — ABNORMAL LOW (ref 57.3–279.2)

## 2021-08-17 LAB — METANEPHRINES, PLASMA
Metanephrine, Free: <10 pg/mL (ref 0.0–88.0)
Normetanephrine, Free: 38.6 pg/mL (ref 0.0–210.1)

## 2021-08-17 LAB — CORTISOL-AM, BLOOD: Cortisol - AM: 6.3 ug/dL (ref 6.2–19.4)

## 2021-08-21 ENCOUNTER — Other Ambulatory Visit: Payer: Self-pay

## 2021-08-21 ENCOUNTER — Telehealth: Payer: Self-pay | Admitting: *Deleted

## 2021-08-21 NOTE — Telephone Encounter (Signed)
Patient would like another referral sent elsewhere. She states her apt was in October. Please advise.  ? ? ?Patient went to pharmacy asking if the medication she was prescribed were okay to take while breast feeding. Patient spoke to [facility] Pharmacist when she got her medication.  ?She wanted to  know when she should retest?  ? ? ?

## 2021-08-22 NOTE — Telephone Encounter (Signed)
Copied from [place] [phone]. Topic: General - Other ?>> Aug 22, 2021  9:33 AM Valere Dross wrote: ?Reason for CRM: Pt called in stating the referral she has for her liver issues, she states that the office was booked until October, and wanting to see about getting a new referral somewhere else. Please advise. ?

## 2021-08-26 NOTE — Telephone Encounter (Signed)
Interpreter assistance provided by  [employer], [place] [id number]. ? ?Patient advise to return for retest in 2 mos after taking ATB.  ? ?Verbalized understanding.  ? ? ?

## 2021-09-19 ENCOUNTER — Telehealth: Payer: Self-pay

## 2021-09-19 NOTE — Telephone Encounter (Signed)
It may be for Dr. Ernestina Patches ? ?

## 2021-09-19 NOTE — Telephone Encounter (Signed)
I called pt and she states she is wanting to get gel injections in both hips  ? ?CB [phone]  ?

## 2021-09-19 NOTE — Telephone Encounter (Signed)
[name] is calling for this pt. She is wondering If we can go ahead and get her approved for a gel injection in her back?  ? ?CB [phone]  ?

## 2021-11-06 ENCOUNTER — Ambulatory Visit (INDEPENDENT_AMBULATORY_CARE_PROVIDER_SITE_OTHER): Payer: Self-pay | Admitting: Orthopaedic Surgery

## 2021-11-06 DIAGNOSIS — M25552 Pain in left hip: Secondary | ICD-10-CM

## 2021-11-06 DIAGNOSIS — M25551 Pain in right hip: Secondary | ICD-10-CM | POA: Insufficient documentation

## 2021-11-06 MED ORDER — METHYLPREDNISOLONE ACETATE 40 MG/ML IJ SUSP
40.0000 mg | INTRAMUSCULAR | Status: AC | PRN
  Administered 2021-11-06: 40 mg via INTRA_ARTICULAR

## 2021-11-06 MED ORDER — BUPIVACAINE HCL 0.5 % IJ SOLN
10.0000 mL | INTRAMUSCULAR | Status: AC | PRN
  Administered 2021-11-06: 10 mL via INTRA_ARTICULAR

## 2021-11-06 NOTE — Progress Notes (Signed)
Office Visit Note   Patient: Kari Ortega           Date of Birth: [birth year]           MRN: [id number] Visit Date: 11/06/2021              Requested by: Ladell Pier, MD [address],  [place] [address] PCP: Ladell Pier, MD   Assessment & Plan: Visit Diagnoses:  1. Bilateral hip pain     Plan: Impression is bilateral hip pain.  Trochanteric injections were performed for both hips today.  Patient tolerated well.  Home exercises were also provided.  Interpreter present during the encounter.  Follow-Up Instructions: No follow-ups on file.   Orders:  No orders of the defined types were placed in this encounter.  No orders of the defined types were placed in this encounter.     Procedures: Large Joint Inj: bilateral greater trochanter on 11/06/2021 10:18 AM Indications: pain Details: 22 G needle  Arthrogram: No  Medications (Right): 40 mg methylPREDNISolone acetate 40 MG/ML; 10 mL bupivacaine 0.5 % Medications (Left): 40 mg methylPREDNISolone acetate 40 MG/ML; 10 mL bupivacaine 0.5 % Patient was prepped and draped in the usual sterile fashion.       Clinical Data: No additional findings.   Subjective: Chief Complaint  Patient presents with   Right Leg - Pain   Left Leg - Pain    HPI Patient is a 41 year old female here with a language interpreter for bilateral lateral hip pain.  Has seen Dr. Ernestina Patches in the past and was felt that the [facility] would not be beneficial based on MRI findings and physical exam.  She is here to explore if trochanteric injections are a possibility.  Review of Systems  Constitutional: Negative.   HENT: Negative.    Eyes: Negative.   Respiratory: Negative.    Cardiovascular: Negative.   Endocrine: Negative.   Musculoskeletal: Negative.   Neurological: Negative.   Hematological: Negative.   Psychiatric/Behavioral: Negative.    All other systems reviewed and are  negative.    Objective: Vital Signs: There were no vitals taken for this visit.  Physical Exam Vitals and nursing note reviewed.  Constitutional:      Appearance: She is well-developed.  Pulmonary:     Effort: Pulmonary effort is normal.  Skin:    General: Skin is warm.     Capillary Refill: Capillary refill takes less than 2 seconds.  Neurological:     Mental Status: She is alert and oriented to person, place, and time.  Psychiatric:        Behavior: Behavior normal.        Thought Content: Thought content normal.        Judgment: Judgment normal.     Ortho Exam Examination of bilateral hip shows point tenderness to the lateral aspect.  No groin pain with range of motion.  No sciatic tension signs. Specialty Comments:  No specialty comments available.  Imaging: No results found.   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  Unable to determine reason per pt.     Irregular periods     Family History  Problem Relation Age of Onset   Diabetes Mother     Past Surgical History:  Procedure Laterality Date   CHOLECYSTECTOMY N/A 06/14/2018   Procedure: LAPAROSCOPIC CHOLECYSTECTOMY;  Surgeon: [name], [name], MD;  Location: [facility];  Service: General;  Laterality: N/A;   TOOTH  EXTRACTION     Social History   Occupational History   Not on file  Tobacco Use   Smoking status: Never   Smokeless tobacco: Never  Vaping Use   Vaping Use: Never used  Substance and Sexual Activity   Alcohol use: No   Drug use: No   Sexual activity: Yes    Birth control/protection: None

## 2021-12-16 ENCOUNTER — Ambulatory Visit: Payer: Self-pay

## 2021-12-16 ENCOUNTER — Ambulatory Visit: Payer: Self-pay | Admitting: *Deleted

## 2021-12-16 NOTE — Telephone Encounter (Signed)
  Chief Complaint: period irregular Symptoms: abd pain, had [place] negative Frequency: pain comes and goes period is irregular Pertinent Negatives: Patient denies fever Disposition: '[]'$ ED /'[]'$ Urgent Care (no appt availability in office) / '[]'$ Appointment(In office/virtual)/ '[]'$  [facility]/ '[]'$ Home Care/ '[]'$ Refused Recommended Disposition /'[x]'$ [place] [facility]/ '[]'$  Follow-up with PCP Additional Notes: Pt had appt for Wednesday but can not make it, canceled appt, she is going to [employer] Address and hours given and explained no appt, first come first served. Verbalized understanding, via interpreter   Reason for Disposition  [1] MODERATE pain (e.g., interferes with normal activities) AND [2] pain comes and goes (cramps) AND [3] present > 24 hours  (Exception: Pain with Vomiting or Diarrhea - see that Guideline.)  Answer Assessment - Initial Assessment Questions 1. LOCATION: "Where does it hurt?"      Left side of abd 2. RADIATION: "Does the pain shoot anywhere else?" (e.g., chest, back)     To her legs 3. ONSET: "When did the pain begin?" (e.g., minutes, hours or days ago)      3 months, had [place] and it was negative 4. SUDDEN: "Gradual or sudden onset?"     Dull pain came on gradually 5. PATTERN "Does the pain come and go, or is it constant?"    - If it comes and goes: "How long does it last?" "Do you have pain now?"     (Note: Comes and goes means the pain is intermittent. It goes away completely between bouts.)    - If constant: "Is it getting better, staying the same, or getting worse?"      (Note: Constant means the pain never goes away completely; most serious pain is constant and gets worse.)      Was better now coming back 6. SEVERITY: "How bad is the pain?"  (e.g., Scale 1-10; mild, moderate, or severe)    - MILD (1-3): Doesn't interfere with normal activities, abdomen soft and not tender to touch.     - MODERATE (4-7): Interferes with normal activities or awakens  from sleep, abdomen tender to touch.     - SEVERE (8-10): Excruciating pain, doubled over, unable to do any normal activities.       6 when bad 7. RECURRENT SYMPTOM: "Have you ever had this type of stomach pain before?" If Yes, ask: "When was the last time?" and "What happened that time?"      Has had for 3 months off and on and now period is abnormal 8. CAUSE: "What do you think is causing the stomach pain?"   Having period off and on a lot, not scheduled like used to be 9. RELIEVING/AGGRAVATING FACTORS: "What makes it better or worse?" (e.g., antacids, bending or twisting motion, bowel movement)     unknown 10. OTHER SYMPTOMS: "Do you have any other symptoms?" (e.g., back pain, diarrhea, fever, urination pain, vomiting)       no 11. PREGNANCY: "Is there any chance you are pregnant?" "When was your last menstrual period?"       no  Protocols used: Abdominal Pain - [facility]

## 2021-12-16 NOTE — Telephone Encounter (Signed)
    Chief Complaint: Irregular vaginal bleeding with abdominal pain. "It just feels different." Symptoms: Above Frequency: 2 months Pertinent Negatives: Patient denies  Disposition: '[]'$ ED /'[]'$ Urgent Care (no appt availability in office) / '[]'$ Appointment(In office/virtual)/ '[]'$  [facility]/ '[]'$ Home Care/ '[]'$ Refused Recommended Disposition /'[]'$ [facility]/ '[x]'$  Follow-up with PCP Additional Notes: Asking to be worked in today. Discussed mobile unit, wants to be seen in office. Answer Assessment - Initial Assessment Questions 1. AMOUNT: "Describe the bleeding that you are having."    - SPOTTING: spotting, or pinkish / brownish mucous discharge; does not fill panty liner or pad    - MILD:  less than 1 pad / hour; less than patient's usual menstrual bleeding   - MODERATE: 1-2 pads / hour; 1 menstrual cup every 6 hours; small-medium blood clots (e.g., pea, grape, small coin)   - SEVERE: soaking 2 or more pads/hour for 2 or more hours; 1 menstrual cup every 2 hours; bleeding not contained by pads or continuous red blood from vagina; large blood clots (e.g., golf ball, large coin)      Mild 2. ONSET: "When did the bleeding begin?" "Is it continuing now?"     Last month 3. MENSTRUAL PERIOD: "When was the last normal menstrual period?" "How is this different than your period?"     2 months ago 4. REGULARITY: "How regular are your periods?"     Irregular 5. ABDOMEN PAIN: "Do you have any pain?" "How bad is the pain?"  (e.g., Scale 1-10; mild, moderate, or severe)   - MILD (1-3): doesn't interfere with normal activities, abdomen soft and not tender to touch    - MODERATE (4-7): interferes with normal activities or awakens from sleep, abdomen tender to touch    - SEVERE (8-10): excruciating pain, doubled over, unable to do any normal activities      Mild 6. PREGNANCY: "Is there any chance you are pregnant?" "When was your last menstrual period?"     No 7. BREASTFEEDING: "Are you  breastfeeding?"     No 8. HORMONE MEDICINES: "Are you taking any hormone medicines, prescription or over-the-counter?" (e.g., birth control pills, estrogen)     No 9. BLOOD THINNER MEDICINES: "Do you take any blood thinners?" (e.g., Coumadin / warfarin, Pradaxa / dabigatran, aspirin)     No 10. CAUSE: "What do you think is causing the bleeding?" (e.g., recent gyn surgery, recent gyn procedure; known bleeding disorder, cervical cancer, polycystic ovarian disease, fibroids)         Unsure 11. HEMODYNAMIC STATUS: "Are you weak or feeling lightheaded?" If Yes, ask: "Can you stand and walk normally?"        Headache 12. OTHER SYMPTOMS: "What other symptoms are you having with the bleeding?" (e.g., passed tissue, vaginal discharge, fever, menstrual-type cramps)       No  Protocols used: Vaginal Bleeding - Abnormal-A-AH

## 2021-12-17 ENCOUNTER — Other Ambulatory Visit (HOSPITAL_COMMUNITY)
Admission: RE | Admit: 2021-12-17 | Discharge: 2021-12-17 | Disposition: A | Discharge status: Home / Self Care | Payer: Self-pay | Source: Ambulatory Visit | Attending: Physician Assistant | Admitting: Physician Assistant

## 2021-12-17 ENCOUNTER — Encounter: Payer: Self-pay | Admitting: Physician Assistant

## 2021-12-17 ENCOUNTER — Telehealth: Payer: Self-pay | Admitting: *Deleted

## 2021-12-17 ENCOUNTER — Ambulatory Visit (INDEPENDENT_AMBULATORY_CARE_PROVIDER_SITE_OTHER): Payer: Self-pay | Admitting: Physician Assistant

## 2021-12-17 VITALS — BP 122/85 | HR 80 | Resp 18 | Ht 59.0 in | Wt 180.0 lb

## 2021-12-17 DIAGNOSIS — D649 Anemia, unspecified: Secondary | ICD-10-CM

## 2021-12-17 DIAGNOSIS — N939 Abnormal uterine and vaginal bleeding, unspecified: Secondary | ICD-10-CM | POA: Insufficient documentation

## 2021-12-17 DIAGNOSIS — Z3202 Encounter for pregnancy test, result negative: Secondary | ICD-10-CM

## 2021-12-17 LAB — POCT URINALYSIS DIP (CLINITEK)
Bilirubin, UA: NEGATIVE
Glucose, UA: NEGATIVE mg/dL
Ketones, POC UA: NEGATIVE mg/dL
Nitrite, UA: NEGATIVE
POC PROTEIN,UA: 30 — AB
Spec Grav, UA: 1.015 (ref 1.010–1.025)
Urobilinogen, UA: 0.2 E.U./dL
pH, UA: 6.5 (ref 5.0–8.0)

## 2021-12-17 LAB — POCT URINE PREGNANCY: Preg Test, Ur: NEGATIVE

## 2021-12-17 NOTE — Telephone Encounter (Signed)
Called pt via interpreter [phone] to advise mobil bus is not in operation today. Message left advising call back for appt with PCE.

## 2021-12-17 NOTE — Telephone Encounter (Signed)
Answer Assessment - Initial Assessment Questions 1  Protocols used: Abdominal Pain - Menstrual Cramps-A-AH  This encounter was created in error - please disregard.

## 2021-12-17 NOTE — Telephone Encounter (Signed)
Patient called back  - bus is not a location.  Apologized and explained that bus was down today. Made appt for pt at [facility] with [facility].

## 2021-12-17 NOTE — Telephone Encounter (Signed)
Pt has appt.-----DD,RMA

## 2021-12-17 NOTE — Progress Notes (Unsigned)
   Established Patient Office Visit  Subjective   Patient ID: Kari Ortega, female    DOB: 01/05/1981  Age: 41 y.o. MRN: [id number]  No chief complaint on file.   States that she has been having abnormal bleeding, states that she is unsure of when her last full period was but started having bleeding again 13 days and has been bleeding for 5 days.  Describes the menses as normal, not heavy, but does endorse some clotting.   Also endorses back pain, suprapubic pain and headaches  Pap 09/29/16 and 12/15/19 was WNL.  States that she is still breast feeding her 41 year old  Due to language barrier, an interpreter was present during the history-taking and subsequent discussion (and for part of the physical exam) with this patient.       {History (Optional):23778}  Review of Systems  Constitutional:  Negative for chills and fever.  HENT: Negative.    Eyes: Negative.   Respiratory:  Negative for shortness of breath.   Cardiovascular:  Negative for chest pain.  Gastrointestinal:  Positive for abdominal pain. Negative for nausea and vomiting.  Genitourinary:  Negative for dysuria and frequency.  Musculoskeletal:  Positive for back pain.  Skin: Negative.   Neurological:  Positive for headaches.  Endo/Heme/Allergies: Negative.   Psychiatric/Behavioral: Negative.        Objective:     There were no vitals taken for this visit. {Vitals History (Optional):23777}  Physical Exam Vitals and nursing note reviewed.  Constitutional:      Appearance: Normal appearance. She is obese.  HENT:     Head: Normocephalic and atraumatic.     Right Ear: External ear normal.     Left Ear: External ear normal.     Nose: Nose normal.     Mouth/Throat:     Mouth: Mucous membranes are moist.     Pharynx: Oropharynx is clear.  Eyes:     Extraocular Movements: Extraocular movements intact.     Conjunctiva/sclera: Conjunctivae normal.     Pupils: Pupils are equal, round, and reactive  to light.  Cardiovascular:     Rate and Rhythm: Normal rate and regular rhythm.     Pulses: Normal pulses.     Heart sounds: Normal heart sounds.  Pulmonary:     Effort: Pulmonary effort is normal.     Breath sounds: Normal breath sounds.  Musculoskeletal:        General: Normal range of motion.     Cervical back: Normal range of motion and neck supple.  Skin:    General: Skin is warm and dry.  Neurological:     General: No focal deficit present.     Mental Status: She is alert and oriented to person, place, and time.  Psychiatric:        Mood and Affect: Mood normal.        Behavior: Behavior normal.        Thought Content: Thought content normal.        Judgment: Judgment normal.      No results found for any visits on 12/17/21.  {Labs (Optional):23779}  The 10-year ASCVD risk score (Arnett DK, et al., 2019) is: 1.4%    Assessment & Plan:   Problem List Items Addressed This Visit   None   No follow-ups on file.    Loraine Grip Mayers, PA-C

## 2021-12-17 NOTE — Telephone Encounter (Signed)
Duplicate message patient has appointment today at 3  with [facility]

## 2021-12-17 NOTE — Telephone Encounter (Signed)
Noted patient has appointment

## 2021-12-17 NOTE — Patient Instructions (Signed)
We will call you with your lab results as soon as they are available.  Kennieth Rad, PA-C Physician Assistant [facility] [email or web address]   Almon Hercules uterino anormal Abnormal Uterine Bleeding  El sangrado uterino anormal sucede cuando tiene un sangrado anormal del tero. Puede tratarse de sangrado despus de [employer], o sangrado o manchas entre los perodos [place]. Tambin puede tratarse de un sangrado ms abundante de lo normal, perodos menstruales que duran ms de lo normal o sangrado que ocurre despus de la menopausia. El sangrado uterino anormal puede Print production planner a las adolescentes, las mujeres en edad frtil, las [place] y las mujeres que han llegado a la menopausia. Las causas ms comunes de sangrado uterino anormal incluyen lo siguiente: Media planner. Crecimientos anormales dentro del revestimiento del tero (plipos). Tumores o crecimientos benignos en el tero (fibromas). Estos no son tumores cancerosos. Infeccin. Cncer. Cantidad excesiva o insuficiente de algunas hormonas en el cuerpo (desequilibrios hormonales). El mdico debe evaluar cualquier clase de sangrado anormal. Muchos casos son leves y simples de tratar, mientras que otros pueden ser ms graves. El tratamiento depender de la causa del sangrado y de su gravedad. Siga estas indicaciones en su casa: Medicamentos Use los medicamentos de venta libre y los recetados solamente como se lo haya indicado el mdico. Consulte al mdico si debe hacer o no lo siguiente: Tomar medicamentos como aspirina e ibuprofeno. Estos medicamentos pueden tener un efecto anticoagulante en la [place]. No tome estos medicamentos a menos que el mdico se lo indique. Usar medicamentos de venta libre, vitaminas, hierbas y suplementos. Si le recetaron comprimidos de hierro, Social research officer, government se lo haya indicado el mdico. Los comprimidos de hierro ayudan a Camera operator hierro que  el organismo pierde a causa de Personnel officer. Control del estreimiento En los casos de sangrado intenso, posiblemente le indiquen que aumente la ingesta de hierro para tratar la anemia. Hacer esto puede causarle estreimiento. Para prevenir o tratar el estreimiento, es posible que deba hacer lo siguiente: Beber suficiente lquido como para Theatre manager la orina de color amarillo plido. Usar medicamentos recetados o de Radio broadcast assistant. Consumir alimentos ricos en fibra, como frijoles, cereales integrales, y frutas y verduras frescas. Limitar el consumo de alimentos ricos en grasa y azcares procesados, como los alimentos fritos o dulces. Actividad Modifique su actividad para disminuir el sangrado si necesita cambiar la toalla higinica ms de una vez cada 2 horas: Acustese en la cama con los pies levantados (elevados). Coloque una compresa fra en la zona inferior del abdomen. Descanse todo lo que pueda hasta que el sangrado se detenga o no sea tan intenso. Indicaciones generales No use tampones ni duchas vaginales, ni tenga relaciones sexuales hasta que el mdico la autorice. Cambie las toallas higinicas con frecuencia. Hgase exmenes regulares. Estos incluyen exmenes plvicos y pruebas de deteccin de cncer de cuello uterino. Es su responsabilidad obtener los [place] de cualquier examen que se realice. Consulte al mdico o pregunte en el departamento donde se realizan las pruebas cundo estarn [employer]. Controle su afeccin para ver si hay cambios. Durante 2 meses, anote: Cundo comienza su perodo menstrual. Cundo finaliza su perodo menstrual. Cundo se produce alguna hemorragia vaginal anormal. Qu problemas observa. Concurra a [place]. Esto es importante. Comunquese con un mdico si: Tiene sangrado que dura ms de [employer]. Se siente mareada por momentos. Siente nuseas o vomita. Se siente dbil o siente que va a desvanecerse. Nota otros  cambios que [employer] su  afeccin est empeorando. Solicite ayuda de inmediato si: Se desmaya. Tiene sangrado que empapa una toalla higinica cada hora. Tiene dolor en el abdomen. Tiene fiebre o escalofros. Se siente dbil o presenta sudoracin. Elimina cogulos de sangre grandes por la vagina. Estos sntomas pueden representar un problema grave que constituye Engineer, maintenance (IT). No espere a ver si los sntomas desaparecen. Solicite atencin mdica de inmediato. Comunquese con el servicio de emergencias de su localidad (911 en los [place]). No conduzca por sus propios medios [employer]. Resumen El sangrado uterino anormal sucede cuando tiene un sangrado anormal del tero. El mdico debe evaluar cualquier clase de sangrado anormal. Muchos casos son leves y simples de tratar, mientras que otros pueden ser ms graves. El tratamiento depender de la causa del sangrado y de su gravedad. Obtenga ayuda de inmediato si se desmaya, tiene sangrado que empapa una toalla higinica cada hora o elimina cogulos de sangre grandes por la vagina. Esta informacin no tiene Marine scientist el consejo del mdico. Asegrese de hacerle al mdico cualquier pregunta que tenga. Document Revised: 09/19/2020 Document Reviewed: 09/19/2020 Elsevier Patient Education  [place].

## 2021-12-18 ENCOUNTER — Ambulatory Visit: Payer: Self-pay | Admitting: Physician Assistant

## 2021-12-18 LAB — CBC WITH DIFFERENTIAL/PLATELET
Basophils Absolute: 0 10*3/uL (ref 0.0–0.2)
Basos: 0 %
EOS (ABSOLUTE): 0.2 10*3/uL (ref 0.0–0.4)
Eos: 2 %
Hematocrit: 31.6 % — ABNORMAL LOW (ref 34.0–46.6)
Hemoglobin: 10.1 g/dL — ABNORMAL LOW (ref 11.1–15.9)
Immature Grans (Abs): 0 10*3/uL (ref 0.0–0.1)
Immature Granulocytes: 0 %
Lymphocytes Absolute: 2.8 10*3/uL (ref 0.7–3.1)
Lymphs: 24 %
MCH: 24 pg — ABNORMAL LOW (ref 26.6–33.0)
MCHC: 32 g/dL (ref 31.5–35.7)
MCV: 75 fL — ABNORMAL LOW (ref 79–97)
Monocytes Absolute: 0.7 10*3/uL (ref 0.1–0.9)
Monocytes: 6 %
Neutrophils Absolute: 8 10*3/uL — ABNORMAL HIGH (ref 1.4–7.0)
Neutrophils: 68 %
Platelets: 343 10*3/uL (ref 150–450)
RBC: 4.2 x10E6/uL (ref 3.77–5.28)
RDW: 16.9 % — ABNORMAL HIGH (ref 11.7–15.4)
WBC: 11.7 10*3/uL — ABNORMAL HIGH (ref 3.4–10.8)

## 2021-12-18 LAB — TSH: TSH: 1.95 u[IU]/mL (ref 0.450–4.500)

## 2021-12-18 NOTE — Addendum Note (Signed)
Addended by: Kennieth Rad on: 12/18/2021 09:18 AM   Modules accepted: Orders

## 2021-12-19 LAB — CERVICOVAGINAL ANCILLARY ONLY
Bacterial Vaginitis (gardnerella): NEGATIVE
Candida Glabrata: NEGATIVE
Candida Vaginitis: NEGATIVE
Chlamydia: NEGATIVE
Comment: NEGATIVE
Comment: NEGATIVE
Comment: NEGATIVE
Comment: NEGATIVE
Comment: NORMAL
Neisseria Gonorrhea: NEGATIVE

## 2021-12-24 ENCOUNTER — Ambulatory Visit (INDEPENDENT_AMBULATORY_CARE_PROVIDER_SITE_OTHER): Payer: Self-pay | Admitting: Orthopaedic Surgery

## 2021-12-24 DIAGNOSIS — M25552 Pain in left hip: Secondary | ICD-10-CM

## 2021-12-24 DIAGNOSIS — M25551 Pain in right hip: Secondary | ICD-10-CM
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
## 2022-01-07 ENCOUNTER — Ambulatory Visit: Payer: Self-pay | Admitting: Internal Medicine

## 2022-01-08 ENCOUNTER — Encounter: Payer: Self-pay | Admitting: Internal Medicine

## 2022-01-08 ENCOUNTER — Ambulatory Visit: Payer: No Typology Code available for payment source | Attending: Internal Medicine | Admitting: Internal Medicine

## 2022-01-08 VITALS — BP 121/81 | HR 65 | Ht <= 58 in | Wt 176.0 lb

## 2022-01-08 DIAGNOSIS — E278 Other specified disorders of adrenal gland: Secondary | ICD-10-CM

## 2022-01-08 DIAGNOSIS — R768 Other specified abnormal immunological findings in serum: Secondary | ICD-10-CM

## 2022-01-08 DIAGNOSIS — D509 Iron deficiency anemia, unspecified: Secondary | ICD-10-CM

## 2022-01-08 DIAGNOSIS — Z2821 Immunization not carried out because of patient refusal: Secondary | ICD-10-CM

## 2022-01-08 NOTE — Progress Notes (Signed)
Patient ID: Kari Ortega, female    DOB: Feb 02, 1981  MRN: [id number]  CC: Follow-up   Subjective: Kari Ortega is a 41 y.o. 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  Reports she completed the course of treatment  Seen by PA Cari earlier this mth for irregular menstrual  bleeding. Menses came on when it was suppose to and lasted 5 days.  Then came back on 13 days later and lasted for 8 days.  Bleeding was moderate and stopped 3 wks ago.   Mild anemia on CBC with H/H 10.1/31.6.  Hb usually run 11-12.  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 30 tablet 5   amoxicillin (AMOXIL) 500 MG capsule Take 2 capsules (1,000 mg total) by mouth 2 (two) times daily. 56 capsule 0   celecoxib (CELEBREX) 200 MG capsule Take 1 capsule (200 mg total) by  mouth daily. 30 capsule 3   clarithromycin (BIAXIN) 500 MG tablet Take 1 tablet (500 mg total) by mouth 2 (two) times daily. 28 tablet 0   traMADol (ULTRAM) 50 MG tablet Take 1 tablet (50 mg total) by mouth every 12 (twelve) hours as needed. 30 tablet 2   omeprazole (PRILOSEC) 20 MG capsule Take 1 capsule (20 mg total) by mouth 2 (two) times daily before a meal for 14 days. 28 capsule 0   Current Facility-Administered Medications on File Prior to Visit  Medication Dose Route Frequency Provider Last Rate Last Admin   polyethylene glycol powder (GLYCOLAX/MIRALAX) container 255 g  1 Container Oral Once Rasch, Artist Pais, NP        No Known Allergies  Social History   Socioeconomic History   Marital status: Married    Spouse name: Not on file   Number of children: Not on file   Years of education: Not on file   Highest education level: Not on file  Occupational History   Not on file  Tobacco Use   Smoking status: Never   Smokeless tobacco: Never  Vaping Use   Vaping Use: Never used  Substance and Sexual Activity   Alcohol use: No   Drug use: No   Sexual activity: Yes    Birth control/protection: None  Other Topics Concern   Not on file  Social History Narrative   Right handed   Social Determinants of Health   Financial Resource Strain: Not on file  Food Insecurity: Not  on file  Transportation Needs: Not on file  Physical Activity: Not on file  Stress: Not on file  Social Connections: Not on file  Intimate Partner Violence: Not on file    Family History  Problem Relation Age of Onset   Diabetes Mother     Past Surgical History:  Procedure Laterality Date   CHOLECYSTECTOMY N/A 06/14/2018   Procedure: LAPAROSCOPIC CHOLECYSTECTOMY;  Surgeon: Mickeal Skinner, MD;  Location: [facility];  Service: General;  Laterality: N/A;   TOOTH EXTRACTION      ROS: Review of Systems Negative except as stated above  PHYSICAL EXAM: BP 121/81   Pulse 65   Ht '4\' 7"'$  (1.397 m)    Wt 176 lb (79.8 kg)   LMP 12/18/2021   SpO2 99%   BMI 40.91 kg/m   Physical Exam  General appearance - alert, well appearing, middle-age Hispanic female and in no distress Mental status - normal mood, behavior, speech, dress, motor activity, and thought processes Chest - clear to auscultation, no wheezes, rales or rhonchi, symmetric air entry Heart - normal rate, regular rhythm, normal S1, S2, no murmurs, rubs, clicks or gallops      Latest Ref Rng & Units 07/11/2021    6:44 PM 07/05/2021   11:05 AM 12/15/2019   10:51 AM  CMP  Glucose 70 - 99 mg/dL 117  95  85   BUN 6 - 20 mg/dL '18  14  12   '$ Creatinine 0.44 - 1.00 mg/dL 0.70  0.59  0.59   Sodium 135 - 145 mmol/L 141  139  142   Potassium 3.5 - 5.1 mmol/L 3.5  4.0  4.2   Chloride 98 - 111 mmol/L 104  103  107   CO2 20 - 29 mmol/L  24  22   Calcium 8.7 - 10.2 mg/dL  8.7  8.6   Total Protein 6.0 - 8.5 g/dL  6.8    Total Bilirubin 0.0 - 1.2 mg/dL  0.2    Alkaline Phos 44 - 121 IU/L  113    AST 0 - 40 IU/L  30    ALT 0 - 32 IU/L  34     Lipid Panel     Component Value Date/Time   CHOL 160 07/05/2021 1105   TRIG 239 (H) 07/05/2021 1105   HDL 28 (L) 07/05/2021 1105   CHOLHDL 5.7 (H) 07/05/2021 1105   LDLCALC 91 07/05/2021 1105    CBC    Component Value Date/Time   WBC 11.7 (H) 12/17/2021 1537   WBC 8.0 06/14/2018 0239   RBC 4.20 12/17/2021 1537   RBC 4.19 06/14/2018 0239   HGB 10.1 (L) 12/17/2021 1537   HCT 31.6 (L) 12/17/2021 1537   PLT 343 12/17/2021 1537   MCV 75 (L) 12/17/2021 1537   MCH 24.0 (L) 12/17/2021 1537   MCH 27.0 06/14/2018 0239   MCHC 32.0 12/17/2021 1537   MCHC 32.1 06/14/2018 0239   RDW 16.9 (H) 12/17/2021 1537   LYMPHSABS 2.8 12/17/2021 1537   MONOABS 0.5 07/01/2016 0948   EOSABS 0.2 12/17/2021 1537   BASOSABS 0.0 12/17/2021 1537    ASSESSMENT AND PLAN: 1. Helicobacter pylori antibody positive Repeat testing today to see if bacteria has cleared. - H. pylori breath test  2. Adrenal nodule  ([name]) Keep upcoming appointment with endocrinologist.  3. Microcytic anemia - Iron, TIBC and Ferritin Panel - CBC  4. Influenza vaccination declined Recommended.  Patient declined.   [employer] Language interpreter used during this  encounter. #[id number], Barnabas Lister  Patient was given the opportunity to ask questions.  Patient verbalized understanding of the plan and was able to repeat key elements of the plan.   This documentation was completed using Radio producer.  Any transcriptional errors are unintentional.  Orders Placed This Encounter  Procedures   Iron, TIBC and Ferritin Panel   CBC   H. pylori breath test     Requested Prescriptions    No prescriptions requested or ordered in this encounter    No follow-ups on file.  Karle Plumber, MD, FACP

## 2022-01-09 ENCOUNTER — Other Ambulatory Visit: Payer: Self-pay | Admitting: Internal Medicine

## 2022-01-09 ENCOUNTER — Other Ambulatory Visit: Payer: Self-pay

## 2022-01-09 ENCOUNTER — Telehealth: Payer: Self-pay | Admitting: Orthopaedic Surgery

## 2022-01-09 LAB — CBC
Hematocrit: 34.3 % (ref 34.0–46.6)
Hemoglobin: 10.5 g/dL — ABNORMAL LOW (ref 11.1–15.9)
MCH: 23.6 pg — ABNORMAL LOW (ref 26.6–33.0)
MCHC: 30.6 g/dL — ABNORMAL LOW (ref 31.5–35.7)
MCV: 77 fL — ABNORMAL LOW (ref 79–97)
Platelets: 338 10*3/uL (ref 150–450)
RBC: 4.45 x10E6/uL (ref 3.77–5.28)
RDW: 16.8 % — ABNORMAL HIGH (ref 11.7–15.4)
WBC: 12.3 10*3/uL — ABNORMAL HIGH (ref 3.4–10.8)

## 2022-01-09 LAB — IRON,TIBC AND FERRITIN PANEL
Ferritin: 14 ng/mL — ABNORMAL LOW (ref 15–150)
Iron Saturation: 8 % — CL (ref 15–55)
Iron: 31 ug/dL (ref 27–159)
Total Iron Binding Capacity: 388 ug/dL (ref 250–450)
UIBC: 357 ug/dL (ref 131–425)

## 2022-01-09 MED ORDER — FERROUS SULFATE 325 (65 FE) MG PO TABS
325.0000 mg | ORAL_TABLET | Freq: Every day | ORAL | 0 refills | Status: DC
  Filled 2022-01-09: qty 90, 90d supply, fill #0
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
## 2022-01-09 NOTE — Telephone Encounter (Signed)
Spoke with patient and left note up front for pick up.

## 2022-01-10 ENCOUNTER — Other Ambulatory Visit: Payer: Self-pay

## 2022-01-10 LAB — H. PYLORI BREATH TEST: H pylori Breath Test: NEGATIVE

## 2022-01-15 ENCOUNTER — Ambulatory Visit: Payer: Self-pay | Admitting: *Deleted

## 2022-01-15 NOTE — Telephone Encounter (Signed)
Please advise.---DD,RMA

## 2022-01-15 NOTE — Telephone Encounter (Signed)
Routing to CMA 

## 2022-01-15 NOTE — Telephone Encounter (Signed)
Pt given lab results per interpreter Delorise Shiner ID# [phone] per notes of Dr.Johnson from 01/09/22 and 01/10/22 on 01/15/22. Pt verbalized understanding. Patient requesting when she needs to have iron rechecked. Patient would also like to have bill for 8/30 to give for financial aid. Please advise .

## 2022-01-17 ENCOUNTER — Telehealth: Payer: Self-pay

## 2022-01-17 NOTE — Telephone Encounter (Signed)
LVM for pt to RTC.-----DD,RMA

## 2022-01-17 NOTE — Telephone Encounter (Signed)
Pt given lab results per notes of Dr. Wynetta Emery on 01/17/22. Pt verbalized understanding.

## 2022-01-22 NOTE — Telephone Encounter (Signed)
LVM for pt to RTC. Letter mailed to pt.----DD,RMA

## 2022-01-30 ENCOUNTER — Ambulatory Visit (INDEPENDENT_AMBULATORY_CARE_PROVIDER_SITE_OTHER): Payer: Self-pay | Admitting: Physician Assistant

## 2022-01-30 ENCOUNTER — Other Ambulatory Visit: Payer: Self-pay

## 2022-01-30 ENCOUNTER — Ambulatory Visit (INDEPENDENT_AMBULATORY_CARE_PROVIDER_SITE_OTHER): Payer: Self-pay

## 2022-01-30 VITALS — BP 130/89 | HR 83 | Temp 98.1°F | Resp 16 | Wt 182.4 lb

## 2022-01-30 DIAGNOSIS — Z789 Other specified health status: Secondary | ICD-10-CM

## 2022-01-30 DIAGNOSIS — N926 Irregular menstruation, unspecified: Secondary | ICD-10-CM

## 2022-01-30 DIAGNOSIS — S161XXA Strain of muscle, fascia and tendon at neck level, initial encounter: Secondary | ICD-10-CM

## 2022-01-30 DIAGNOSIS — G8929 Other chronic pain: Secondary | ICD-10-CM

## 2022-01-30 DIAGNOSIS — M545 Low back pain, unspecified: Secondary | ICD-10-CM

## 2022-01-30 MED ORDER — METHOCARBAMOL 500 MG PO TABS
1000.0000 mg | ORAL_TABLET | Freq: Three times a day (TID) | ORAL | 0 refills | Status: DC | PRN
  Filled 2022-01-30: qty 90, 15d supply, fill #0

## 2022-01-30 MED ORDER — CELECOXIB 200 MG PO CAPS
200.0000 mg | ORAL_CAPSULE | Freq: Every day | ORAL | 1 refills | Status: DC
  Filled 2022-01-30: qty 30, 30d supply, fill #0

## 2022-01-30 NOTE — Progress Notes (Signed)
Patient ID: Kari Ortega, female   DOB: October 17, 1980, 41 y.o.   MRN: [id number] Pain in L neck    Kari Ortega, is a 41 y.o. female  ENI:[id number]  IRW:[id number]  DOB - Mar 19, 1981  Chief Complaint  Patient presents with   Neck Pain       Subjective:   Kari Ortega is a 41 y.o. female here today for lump and tender knot on the L trapezius area.  Massage helps some.  She is R hand dominant. NKI.  No paresthesias  Onset:  about 2 weeks ago Location: upper L back/neck Duration:  2 weeks Characterization/quality:  aching/sore Alleviating/aggravating:  massage helps/movement makes worse Radiation: no Severity: moderate  Still having irregular periods/spotting, etc.  This has been going on for about 78mhs to a year.  Blood work done last month.    No problems updated.  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  Unable to determine reason per pt.     Irregular periods     MEDICATIONS AT HOME: Prior to Admission medications   Medication Sig Start Date End Date Taking? Authorizing Provider  amitriptyline (ELAVIL) 10 MG tablet Take 1 tablet (10 mg total) by mouth at bedtime. 12/12/20  Yes JTomi Likens Adam R, DO  ferrous sulfate 325 (65 FE) MG tablet Take 1 tablet (325 mg total) by mouth daily with breakfast. 01/09/22  Yes JLadell Pier MD  methocarbamol (ROBAXIN) 500 MG tablet Take 2 tablets (1,000 mg total) by mouth every 8 (eight) hours as needed for muscle spasms. 01/30/22  Yes Maghan Jessee, ADionne Bucy PA-C  traMADol (ULTRAM) 50 MG tablet Take 1 tablet (50 mg total) by mouth every 12 (twelve) hours as needed. 04/30/21  Yes SAundra Dubin PA-C  celecoxib (CELEBREX) 200 MG capsule Take 1 capsule (200 mg total) by mouth daily. Prn pain 01/30/22   MArgentina Donovan PA-C  omeprazole (PRILOSEC) 20 MG capsule Take 1 capsule (20 mg  total) by mouth 2 (two) times daily before a meal for 14 days. 08/15/21 08/29/21  JLadell Pier MD    ROS: Neg HEENT Neg resp Neg cardiac Neg GI Neg GU Neg psych Neg neuro  Objective:   Vitals:   01/30/22 1528 01/30/22 1532  BP: (!) 133/91 130/89  Pulse: 91 83  Resp: 16   Temp: 98.1 F (36.7 C)   TempSrc: Oral   SpO2: 94%   Weight: 182 lb 6.4 oz (82.7 kg)    Exam General appearance : Awake, alert, not in any distress. Speech Clear. Not toxic looking HEENT: Atraumatic and Normocephalic Neck: Supple, no JVD. No cervical lymphadenopathy.  Chest: Good air entry bilaterally, CTAB.  No rales/rhonchi/wheezing CVS: S1 S2 regular, no murmurs.  Posterior neck and upper back; there is a tender spasm at the base of the L neck/trapezius that is slightly more prominent and tender than the R.  UE DTR=intact Extremities: B/L Lower Ext shows no edema, both legs are warm to touch Neurology: Awake alert, and oriented X 3, CN II-XII intact, Non focal Skin: No Rash  Data Review Lab Results  Component Value Date   HGBA1C 5.6 07/05/2021   HGBA1C 5.4 06/11/2020   HGBA1C 5.7 (H) 12/15/2019    Assessment & Plan   1. Strain of neck muscle, initial encounter - celecoxib (CELEBREX) 200 MG capsule; Take 1 capsule (200 mg total) by  mouth daily. Prn pain  Dispense: 30 capsule; Refill: 1 - methocarbamol (ROBAXIN) 500 MG tablet; Take 2 tablets (1,000 mg total) by mouth every 8 (eight) hours as needed for muscle spasms.  Dispense: 90 tablet; Refill: 0 - DG Cervical Spine 2 or 3 views  2. Language barrier "Antonio"-[employer] interpreters used and additional time performing visit was required.   3. Irregular periods Blood work already done regarding this - Ambulatory referral to Gynecology  4. Chronic bilateral low back pain without sciatica - celecoxib (CELEBREX) 200 MG capsule; Take 1 capsule (200 mg total) by mouth daily. Prn pain  Dispense: 30 capsule; Refill: 1 - methocarbamol (ROBAXIN) 500  MG tablet; Take 2 tablets (1,000 mg total) by mouth every 8 (eight) hours as needed for muscle spasms.  Dispense: 90 tablet; Refill: 0    Return if symptoms worsen or fail to improve.  The patient was given clear instructions to go to ER or return to medical center if symptoms don't improve, worsen or new problems develop. The patient verbalized understanding. The patient was told to call to get lab results if they haven't heard anything in the next week.      Freeman Caldron, PA-C [facility] [place], [place]   01/31/2022, 11:17 AM

## 2022-02-05 ENCOUNTER — Other Ambulatory Visit: Payer: Self-pay

## 2022-02-11 ENCOUNTER — Ambulatory Visit: Payer: Self-pay | Admitting: Orthopaedic Surgery

## 2022-03-04 ENCOUNTER — Ambulatory Visit: Payer: Self-pay | Admitting: Internal Medicine

## 2022-03-05 ENCOUNTER — Ambulatory Visit: Payer: Self-pay

## 2022-03-05 ENCOUNTER — Encounter: Payer: Self-pay | Admitting: Orthopaedic Surgery

## 2022-03-05 ENCOUNTER — Ambulatory Visit (INDEPENDENT_AMBULATORY_CARE_PROVIDER_SITE_OTHER): Payer: Self-pay | Admitting: Physician Assistant

## 2022-03-05 DIAGNOSIS — M25552 Pain in left hip: Secondary | ICD-10-CM

## 2022-03-05 DIAGNOSIS — M25551 Pain in right hip: Secondary | ICD-10-CM
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
## 2022-03-07 ENCOUNTER — Ambulatory Visit: Payer: Self-pay | Admitting: Orthopaedic Surgery

## 2022-03-14 ENCOUNTER — Ambulatory Visit
Admission: RE | Admit: 2022-03-14 | Discharge: 2022-03-14 | Disposition: A | Discharge status: Home / Self Care | Payer: No Typology Code available for payment source | Source: Ambulatory Visit | Attending: Orthopaedic Surgery | Admitting: Orthopaedic Surgery

## 2022-03-14 DIAGNOSIS — M25551 Pain in right hip: Secondary | ICD-10-CM

## 2022-03-21 ENCOUNTER — Ambulatory Visit (INDEPENDENT_AMBULATORY_CARE_PROVIDER_SITE_OTHER): Payer: Self-pay | Admitting: Orthopaedic Surgery

## 2022-03-21 DIAGNOSIS — M25551 Pain in right hip: Secondary | ICD-10-CM

## 2022-03-21 DIAGNOSIS — M25552 Pain in left hip: Secondary | ICD-10-CM
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
## 2022-03-26 ENCOUNTER — Ambulatory Visit (INDEPENDENT_AMBULATORY_CARE_PROVIDER_SITE_OTHER): Payer: Self-pay | Admitting: Internal Medicine

## 2022-03-26 ENCOUNTER — Encounter: Payer: Self-pay | Admitting: Internal Medicine

## 2022-03-26 ENCOUNTER — Other Ambulatory Visit: Payer: Self-pay

## 2022-03-26 VITALS — BP 114/72 | HR 70 | Ht <= 58 in | Wt 184.0 lb

## 2022-03-26 DIAGNOSIS — R635 Abnormal weight gain: Secondary | ICD-10-CM

## 2022-03-26 DIAGNOSIS — D3502 Benign neoplasm of left adrenal gland: Secondary | ICD-10-CM

## 2022-03-26 MED ORDER — DEXAMETHASONE 1 MG PO TABS
1.0000 mg | ORAL_TABLET | Freq: Once | ORAL | 0 refills | Status: AC
  Filled 2022-03-26: qty 1, 1d supply, fill #0

## 2022-03-26 NOTE — Progress Notes (Signed)
Name: Kari Ortega  MRN/ DOB: [id number], 22-Aug-1980    Age/ Sex: 41 y.o., female    PCP: Ladell Pier, MD   Reason for Endocrinology Evaluation: Adrenal adenoma     Date of Initial Endocrinology Evaluation: 03/26/2022     HPI: Ms. Kari Ortega is a 41 y.o. female with a past medical history of dyslipidemia and degenerative joint disease. The patient presented for initial endocrinology clinic visit on 03/26/2022 for consultative assistance with her adrenal adenoma.    During evaluation for abdominal pain in February 2023 she was noted to have a left 1.3 cm fat-containing adrenal adenoma on CT imaging of the abdomen.   In reviewing her records she has been noted with normal metanephrines, cortisol, renin, Aldo, and Aldo/renin ratio    Adrenal incidentaloma: Substantial weight gain- yes  Centripetal obesity- yes  Easy bruisability- yes Severe hypertension- no  DM- no Fatigue-  yes Sudden/ severe headaches- occasionally  Weight loss- no  Anxiety attacks- no Sweating- no Cardiac arrhythmias- no Palpitations- occasional  Fluid retention- no  Hypokalemia-  no    HISTORY:  Past Medical History:  Past Medical History:  Diagnosis Date   Anemia    Facial paralysis    c/o facial paralysis with last deliveries.  Unable to determine reason per pt.     Irregular periods    Past Surgical History:  Past Surgical History:  Procedure Laterality Date   CHOLECYSTECTOMY N/A 06/14/2018   Procedure: LAPAROSCOPIC CHOLECYSTECTOMY;  Surgeon: [name], [name], MD;  Location: [place];  Service: General;  Laterality: N/A;   TOOTH EXTRACTION      Social History:  reports that she has never smoked. She has never used smokeless tobacco. She reports that she does not drink alcohol and does not use drugs. Family History: family history includes Diabetes in her mother.   HOME MEDICATIONS: Allergies as of 03/26/2022   No Known Allergies       Medication List        Accurate as of March 26, 2022  9:52 AM. If you have any questions, ask your nurse or doctor.          amitriptyline 10 MG tablet Commonly known as: ELAVIL Take 1 tablet (10 mg total) by mouth at bedtime.   celecoxib 200 MG capsule Commonly known as: CeleBREX Take 1 capsule (200 mg total) by mouth daily. Prn pain   FeroSul 325 (65 FE) MG tablet Generic drug: ferrous sulfate Tome 1 tableta (325 mg en total) por va oral diariamente con el desayuno. (Take 1 tablet (325 mg total) by mouth daily with breakfast.)   methocarbamol 500 MG tablet Commonly known as: ROBAXIN Take 2 tablets (1,000 mg total) by mouth every 8 (eight) hours as needed for muscle spasms.   omeprazole 20 MG capsule Commonly known as: PRILOSEC Take 1 capsule (20 mg total) by mouth 2 (two) times daily before a meal for 14 days.   traMADol 50 MG tablet Commonly known as: ULTRAM Take 1 tablet (50 mg total) by mouth every 12 (twelve) hours as needed.          REVIEW OF SYSTEMS: A comprehensive ROS was conducted with the patient and is negative except as per HPI   OBJECTIVE:  VS: BP 114/72 (BP Location: Left Arm, Patient Position: Sitting, Cuff Size: Large)   Pulse 70   Ht '4\' 7"'$  (1.397 m)   Wt 184 lb (83.5 kg)   SpO2 96%   BMI 42.77  kg/m    Wt Readings from Last 3 Encounters:  03/26/22 184 lb (83.5 kg)  01/30/22 182 lb 6.4 oz (82.7 kg)  01/08/22 176 lb (79.8 kg)     EXAM: General: Pt appears well and is in NAD  Neck: General: Supple without adenopathy. Thyroid: Thyroid size normal.  No goiter or nodules appreciated.  Lungs: Clear with good BS bilat with no rales, rhonchi, or wheezes  Heart: Auscultation: RRR.  Abdomen: Normoactive bowel sounds, soft, nontender, without masses or organomegaly palpable  Extremities:  BL LE: No pretibial edema normal ROM and strength.  Mental Status: Judgment, insight: Intact Orientation: Oriented to time, place, and person Mood  and affect: No depression, anxiety, or agitation     DATA REVIEWED:   Latest Reference Range & Units Most Recent  ACTH 7.2 - 63.3 pg/mL 17.6 08/15/21 15:39  ALDOSTERONE 0.0 - 30.0 ng/dL 21.3 08/08/21 08:56  Cortisol - AM 6.2 - 19.4 ug/dL 6.3 08/08/21 08:56  Renin 0.167 - 5.380 ng/mL/hr 2.734 08/08/21 08:56  ALDOS/RENIN RATIO 0.0 - 30.0  7.8 08/08/21 08:56  Metanephrine, Pl 0.0 - 88.0 pg/mL <10.0 08/08/21 08:56  Normetanephrine, Pl 0.0 - 210.1 pg/mL 38.6 08/08/21 08:56    CT Abdomen 06/21/2021  Narrative & Impression  CLINICAL DATA:  Abdominal pain, acute, nonlocalized. Left lower quadrant abdominal pain and umbilical pain.    FINDINGS: Lower chest: Lung bases are clear except for a 3 mm calcified granuloma in the posterior left lower lobe.   Hepatobiliary: Previous cholecystectomy. Liver parenchyma is normal.   Pancreas: Normal   Spleen: Normal   Adrenals/Urinary Tract: Adrenal glands are normal except for a fat-density adenoma on the left measuring 13 mm. Contrast is present within the renal collecting systems, which could obscure small nonobstructing renal calculi. No hydronephrosis. No evidence of passing stone. No stone in the bladder.     Old records , labs and images have been reviewed.    ASSESSMENT/PLAN/RECOMMENDATIONS:   Left Adrenal Adenoma:   - Eighty five percent of adrenal adenomas are nonsecretory.  - Three forms of adrenal hyperfunction should be considered in patients with adrenal incidentaloma  Glucocorticoid hypersecretion Primary hyperaldosteronism Pheochromocytoma  -She already had normal renin, Aldo, Aldo/renin ratio and metanephrines in March 2023 -We will proceed with screening for Cushing syndrome as below -Patient will return for a fasting cortisol level, a prescription for dexamethasone 1 mg has been sent to the pharmacy today  TESTING: 1. '1mg'$  overnight dexamethasone suppression test     2.  Weight gain:  -Patient is  complaining of weight gain and fatty deposits around her arms -We did discuss avoiding sugar sweetened beverages as well as desserts , patient seemed surprised by this request -I also advised the patient to limit carbohydrate intake, we went over foods that are considered carbohydrates, I have encouraged her to increase fresh fruits, vegetables and lean protein -I have also advised the patient to start regular exercise by walking 150 minutes a week  Signed electronically by: Mack Guise, MD  [facility]  [employer] [address] [place], [place] [address] Phone: [phone] FAX: [phone]   CC: Ladell Pier, MD [place] [address] Phone: [phone] Fax: [phone]   Return to Endocrinology clinic as below: Future Appointments  Date Time Provider [place]  04/22/2022  3:50 PM Ladell Pier, MD [id number] None

## 2022-03-26 NOTE — Patient Instructions (Addendum)
Instructions for Dexamethasone Suppression Test   Step 1: Choose a morning when you can come to our lab at 8:00 am for a blood draw.   Step 2: On the night before the blood draw, take one 1 mg tablet of dexamethasone at 11:30 pm.  The timing is VERY important!   Step 3: The next morning, go to the lab for blood work at 8:00 am.  Kari Bast do not have to be on an empty stomach, but the timing is VERY important!    Instrucciones para la prueba de supresin con [employer]  Paso 1: Elija una maana en la que pueda venir a nuestro laboratorio a las 8:00 a. m. para una extraccin de [place].  Paso 2: La noche anterior a la extraccin de sangre, tome un comprimido de dexametasona de 1 mg a las 23:30 horas. El momento es MUY importante!  Paso 3: A la maana siguiente, vaya al laboratorio para hacerse un anlisis de sangre a las 8:00 a. m. No es necesario que ests con el estmago vaco, pero el momento es MUY importante!

## 2022-04-01 ENCOUNTER — Other Ambulatory Visit: Payer: Self-pay

## 2022-04-01 MED ORDER — FLUARIX QUADRIVALENT 0.5 ML IM SUSY
PREFILLED_SYRINGE | INTRAMUSCULAR | 0 refills | Status: DC
  Filled 2022-04-01: qty 0.5, 1d supply, fill #0

## 2022-04-07 ENCOUNTER — Other Ambulatory Visit (INDEPENDENT_AMBULATORY_CARE_PROVIDER_SITE_OTHER): Payer: No Typology Code available for payment source

## 2022-04-07 DIAGNOSIS — D3502 Benign neoplasm of left adrenal gland: Secondary | ICD-10-CM

## 2022-04-07 LAB — CORTISOL: Cortisol, Plasma: 0.2 ug/dL

## 2022-04-08 ENCOUNTER — Other Ambulatory Visit: Payer: Self-pay

## 2022-04-08 ENCOUNTER — Telehealth: Payer: Self-pay | Admitting: Orthopaedic Surgery

## 2022-04-08 DIAGNOSIS — M25552 Pain in left hip: Secondary | ICD-10-CM

## 2022-04-08 NOTE — Telephone Encounter (Signed)
Patient called in stating she was told she would start back PT and she called and there is no referral in there for her please advise

## 2022-04-08 NOTE — Telephone Encounter (Signed)
Approve.  Thanks.

## 2022-04-22 ENCOUNTER — Encounter: Payer: Self-pay | Admitting: Internal Medicine

## 2022-04-22 ENCOUNTER — Ambulatory Visit: Payer: No Typology Code available for payment source | Attending: Internal Medicine | Admitting: Internal Medicine

## 2022-04-22 VITALS — BP 118/80 | HR 83 | Temp 97.7°F | Ht <= 58 in | Wt 185.0 lb

## 2022-04-22 DIAGNOSIS — M25552 Pain in left hip: Secondary | ICD-10-CM

## 2022-04-22 DIAGNOSIS — M25551 Pain in right hip: Secondary | ICD-10-CM

## 2022-04-22 DIAGNOSIS — E611 Iron deficiency: Secondary | ICD-10-CM

## 2022-04-22 NOTE — Progress Notes (Signed)
Patient ID: Kari Ortega, female    DOB: July 02, 1980  MRN: [id number]  CC: Follow-up (Follow up. /Hip pain on R & L side. Constant)   Subjective: Kari Ortega is a 41 y.o. female who presents for routine f/u visit. Her concerns today include:  Pt with hx obesity, HL, pre DM, left adrenal incidentaloma with positive low-dose dexamethasone suppression test, positive H. Pylori eradicated   Since last visit with me, she has seen Dr. Leonette Monarch for left adrenal adenoma.  She repeated the dexamethasone suppression test and it came back normal.  She has also been seeing the orthopedic specialist for bilateral hip pain.  MRI revealed small low-grade partial tear of the left hamstring tendon.  She was tried on multiple medications including steroids and muscle relaxers as well as some physical therapy with mild relief.  She was referred to Dr. Ernestina Patches for consideration of possible ESI but she declined.  She had no relief with trochanteric bursa injections.  She was offered referral to Dr. Rolena Infante for injections to the left proximal partial hamstring tear but patient wanted to try another session of physical therapy first.  She will be starting that soon.  She does not find Celebrex or meloxicam to be helpful.  She takes Tylenol when she has increased pain which usually occurs if she is doing a lot during the day.  She has not had to take the Tylenol every day.  She finds the Tylenol helpful.  She was found to have iron deficiency on last visit with me.  Iron supplement prescribed.  She has not had any dizziness.  She has been taking the iron daily. 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 100 tablet 0   influenza vac split quadrivalent PF (FLUARIX QUADRIVALENT) 0.5 ML injection Inject into the muscle. 0.5 mL 0   amitriptyline (ELAVIL) 10 MG tablet Take 1 tablet (10 mg total) by mouth at bedtime. (Patient not taking: Reported on 04/22/2022) 30 tablet 5   celecoxib (CELEBREX) 200 MG capsule Take 1 capsule (200 mg total) by mouth daily. Prn pain (Patient not taking: Reported on 04/22/2022) 30 capsule 1   methocarbamol (ROBAXIN) 500 MG tablet Take 2 tablets (1,000 mg total) by mouth every 8 (eight) hours as needed for muscle spasms. (Patient not taking: Reported on 04/22/2022) 90 tablet 0   omeprazole (PRILOSEC) 20 MG capsule Take 1 capsule (20 mg total) by mouth 2 (two) times daily before a meal for 14 days. 28 capsule 0   Current Facility-Administered Medications on File Prior to Visit  Medication Dose Route Frequency Provider Last Rate Last Admin   polyethylene glycol powder (GLYCOLAX/MIRALAX) container 255 g  1 Container Oral Once Rasch, Artist Pais, NP  No Known Allergies  Social History   Socioeconomic History   Marital status: Married    Spouse name: Not on file   Number of children: Not on file   Years of education: Not on file   Highest education level: Not on file  Occupational History   Not on file  Tobacco Use   Smoking status: Never   Smokeless tobacco: Never  Vaping Use   Vaping Use: Never used  Substance and Sexual Activity   Alcohol use: No   Drug use: No   Sexual activity: Yes     Birth control/protection: None  Other Topics Concern   Not on file  Social History Narrative   Right handed   Social Determinants of Health   Financial Resource Strain: Not on file  Food Insecurity: Not on file  Transportation Needs: Not on file  Physical Activity: Not on file  Stress: Not on file  Social Connections: Not on file  Intimate Partner Violence: Not on file    Family History  Problem Relation Age of Onset   Diabetes Mother     Past Surgical History:  Procedure Laterality Date   CHOLECYSTECTOMY N/A 06/14/2018   Procedure: LAPAROSCOPIC CHOLECYSTECTOMY;  Surgeon: Mickeal Skinner, MD;  Location: [place];  Service: General;  Laterality: N/A;   TOOTH EXTRACTION      ROS: Review of Systems Negative except as stated above  PHYSICAL EXAM: BP 118/80   Pulse 83   Temp 97.7 F (36.5 C) (Oral)   Ht '4\' 7"'$  (1.397 m)   Wt 185 lb (83.9 kg)   SpO2 98%   BMI 43.00 kg/m   Physical Exam  General appearance - alert, well appearing, and in no distress Mental status - normal mood, behavior, speech, dress, motor activity, and thought processes Chest - clear to auscultation, no wheezes, rales or rhonchi, symmetric air entry Heart - normal rate, regular rhythm, normal S1, S2, no murmurs, rubs, clicks or gallops      Latest Ref Rng & Units 07/11/2021    6:44 PM 07/05/2021   11:05 AM 12/15/2019   10:51 AM  CMP  Glucose 70 - 99 mg/dL 117  95  85   BUN 6 - 20 mg/dL '18  14  12   '$ Creatinine 0.44 - 1.00 mg/dL 0.70  0.59  0.59   Sodium 135 - 145 mmol/L 141  139  142   Potassium 3.5 - 5.1 mmol/L 3.5  4.0  4.2   Chloride 98 - 111 mmol/L 104  103  107   CO2 20 - 29 mmol/L  24  22   Calcium 8.7 - 10.2 mg/dL  8.7  8.6   Total Protein 6.0 - 8.5 g/dL  6.8    Total Bilirubin 0.0 - 1.2 mg/dL  0.2    Alkaline Phos 44 - 121 IU/L  113    AST 0 - 40 IU/L  30    ALT 0 - 32 IU/L  34     Lipid Panel     Component Value Date/Time   CHOL 160 07/05/2021 1105   TRIG 239 (H) 07/05/2021  1105   HDL 28 (L) 07/05/2021 1105   CHOLHDL 5.7 (H) 07/05/2021 1105   LDLCALC 91 07/05/2021 1105    CBC    Component Value Date/Time   WBC 12.3 (H) 01/08/2022 1107   WBC 8.0 06/14/2018 0239   RBC 4.45 01/08/2022 1107   RBC 4.19 06/14/2018 0239   HGB 10.5 (L) 01/08/2022 1107  HCT 34.3 01/08/2022 1107   PLT 338 01/08/2022 1107   MCV 77 (L) 01/08/2022 1107   MCH 23.6 (L) 01/08/2022 1107   MCH 27.0 06/14/2018 0239   MCHC 30.6 (L) 01/08/2022 1107   MCHC 32.1 06/14/2018 0239   RDW 16.8 (H) 01/08/2022 1107   LYMPHSABS 2.8 12/17/2021 1537   MONOABS 0.5 07/01/2016 0948   EOSABS 0.2 12/17/2021 1537   BASOSABS 0.0 12/17/2021 1537    ASSESSMENT AND PLAN: 1. Bilateral hip pain Patient will start physical therapy next week. She will continue over-the-counter Tylenol as needed  2. Iron deficiency Recheck iron studies and CBC today to see if any improvement.  Continue Iron supplement for now.  - CBC - Iron, TIBC and Ferritin Panel     Patient was given the opportunity to ask questions.  Patient verbalized understanding of the plan and was able to repeat key elements of the plan.   This documentation was completed using Radio producer.  Any transcriptional errors are unintentional.  Orders Placed This Encounter  Procedures   CBC   Iron, TIBC and Ferritin Panel     Requested Prescriptions    No prescriptions requested or ordered in this encounter    No follow-ups on file.  Karle Plumber, MD, FACP
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
## 2022-04-23 ENCOUNTER — Other Ambulatory Visit: Payer: Self-pay

## 2022-04-23 ENCOUNTER — Other Ambulatory Visit: Payer: Self-pay | Admitting: Internal Medicine

## 2022-04-23 ENCOUNTER — Ambulatory Visit: Payer: Self-pay | Attending: Orthopaedic Surgery

## 2022-04-23 DIAGNOSIS — M25552 Pain in left hip: Secondary | ICD-10-CM

## 2022-04-23 DIAGNOSIS — M25551 Pain in right hip: Secondary | ICD-10-CM

## 2022-04-23 DIAGNOSIS — M545 Low back pain, unspecified: Secondary | ICD-10-CM | POA: Insufficient documentation

## 2022-04-23 DIAGNOSIS — M6281 Muscle weakness (generalized): Secondary | ICD-10-CM

## 2022-04-23 DIAGNOSIS — G8929 Other chronic pain: Secondary | ICD-10-CM | POA: Insufficient documentation

## 2022-04-23 DIAGNOSIS — E611 Iron deficiency: Secondary | ICD-10-CM

## 2022-04-23 LAB — CBC
Hematocrit: 32.7 % — ABNORMAL LOW (ref 34.0–46.6)
Hemoglobin: 10.3 g/dL — ABNORMAL LOW (ref 11.1–15.9)
MCH: 23.9 pg — ABNORMAL LOW (ref 26.6–33.0)
MCHC: 31.5 g/dL (ref 31.5–35.7)
MCV: 76 fL — ABNORMAL LOW (ref 79–97)
Platelets: 335 10*3/uL (ref 150–450)
RBC: 4.31 x10E6/uL (ref 3.77–5.28)
RDW: 15.8 % — ABNORMAL HIGH (ref 11.7–15.4)
WBC: 10.7 10*3/uL (ref 3.4–10.8)

## 2022-04-23 LAB — IRON,TIBC AND FERRITIN PANEL
Ferritin: 23 ng/mL (ref 15–150)
Iron Saturation: 7 % — CL (ref 15–55)
Iron: 26 ug/dL — ABNORMAL LOW (ref 27–159)
Total Iron Binding Capacity: 352 ug/dL (ref 250–450)
UIBC: 326 ug/dL (ref 131–425)

## 2022-04-30 ENCOUNTER — Ambulatory Visit: Payer: Self-pay
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
## 2022-05-01 ENCOUNTER — Ambulatory Visit: Payer: Self-pay

## 2022-05-07 ENCOUNTER — Ambulatory Visit: Payer: Self-pay

## 2022-05-07 DIAGNOSIS — M25551 Pain in right hip: Secondary | ICD-10-CM

## 2022-05-07 DIAGNOSIS — M6281 Muscle weakness (generalized): Secondary | ICD-10-CM

## 2022-05-07 DIAGNOSIS — M25552 Pain in left hip: Secondary | ICD-10-CM
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
## 2022-05-08 ENCOUNTER — Ambulatory Visit: Payer: Self-pay

## 2022-05-08 DIAGNOSIS — M6281 Muscle weakness (generalized): Secondary | ICD-10-CM

## 2022-05-08 DIAGNOSIS — G8929 Other chronic pain: Secondary | ICD-10-CM

## 2022-05-08 DIAGNOSIS — M25551 Pain in right hip: Secondary | ICD-10-CM

## 2022-05-08 DIAGNOSIS — M25552 Pain in left hip: Secondary | ICD-10-CM
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
## 2022-05-13 ENCOUNTER — Ambulatory Visit: Payer: Self-pay | Attending: Orthopaedic Surgery

## 2022-05-13 DIAGNOSIS — M25551 Pain in right hip: Secondary | ICD-10-CM | POA: Insufficient documentation

## 2022-05-13 DIAGNOSIS — M545 Low back pain, unspecified: Secondary | ICD-10-CM | POA: Insufficient documentation

## 2022-05-13 DIAGNOSIS — M6281 Muscle weakness (generalized): Secondary | ICD-10-CM | POA: Insufficient documentation

## 2022-05-13 DIAGNOSIS — G8929 Other chronic pain: Secondary | ICD-10-CM | POA: Insufficient documentation

## 2022-05-13 DIAGNOSIS — M25552 Pain in left hip: Secondary | ICD-10-CM | POA: Insufficient documentation
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
## 2022-05-14 ENCOUNTER — Ambulatory Visit: Payer: Self-pay

## 2022-05-14 DIAGNOSIS — M25552 Pain in left hip: Secondary | ICD-10-CM

## 2022-05-14 DIAGNOSIS — M25551 Pain in right hip: Secondary | ICD-10-CM

## 2022-05-14 DIAGNOSIS — M6281 Muscle weakness (generalized): Secondary | ICD-10-CM
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
## 2022-05-19 ENCOUNTER — Ambulatory Visit: Payer: Self-pay

## 2022-05-19 DIAGNOSIS — M6281 Muscle weakness (generalized): Secondary | ICD-10-CM

## 2022-05-19 DIAGNOSIS — M25551 Pain in right hip: Secondary | ICD-10-CM

## 2022-05-19 DIAGNOSIS — M25552 Pain in left hip: Secondary | ICD-10-CM
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
## 2022-05-20 ENCOUNTER — Ambulatory Visit (INDEPENDENT_AMBULATORY_CARE_PROVIDER_SITE_OTHER): Payer: Self-pay | Admitting: Orthopaedic Surgery

## 2022-05-20 DIAGNOSIS — M545 Low back pain, unspecified: Secondary | ICD-10-CM

## 2022-05-20 DIAGNOSIS — G8929 Other chronic pain: Secondary | ICD-10-CM
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
## 2022-05-21 ENCOUNTER — Ambulatory Visit: Payer: Self-pay

## 2022-05-21 DIAGNOSIS — M25551 Pain in right hip: Secondary | ICD-10-CM

## 2022-05-21 DIAGNOSIS — M6281 Muscle weakness (generalized): Secondary | ICD-10-CM

## 2022-05-21 DIAGNOSIS — M545 Low back pain, unspecified: Secondary | ICD-10-CM

## 2022-05-21 DIAGNOSIS — M25552 Pain in left hip: Secondary | ICD-10-CM
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
## 2022-05-22 ENCOUNTER — Ambulatory Visit: Payer: No Typology Code available for payment source | Attending: Internal Medicine

## 2022-05-22 DIAGNOSIS — E611 Iron deficiency: Secondary | ICD-10-CM

## 2022-05-23 LAB — CBC
Hematocrit: 38.7 % (ref 34.0–46.6)
Hemoglobin: 12 g/dL (ref 11.1–15.9)
MCH: 24.8 pg — ABNORMAL LOW (ref 26.6–33.0)
MCHC: 31 g/dL — ABNORMAL LOW (ref 31.5–35.7)
MCV: 80 fL (ref 79–97)
Platelets: 299 10*3/uL (ref 150–450)
RBC: 4.84 x10E6/uL (ref 3.77–5.28)
RDW: 17.9 % — ABNORMAL HIGH (ref 11.7–15.4)
WBC: 10.7 10*3/uL (ref 3.4–10.8)

## 2022-05-26 ENCOUNTER — Other Ambulatory Visit: Payer: Self-pay

## 2022-05-26 ENCOUNTER — Telehealth: Payer: Self-pay | Admitting: Orthopaedic Surgery

## 2022-05-26 DIAGNOSIS — M545 Low back pain, unspecified: Secondary | ICD-10-CM

## 2022-05-26 NOTE — Telephone Encounter (Signed)
Xu stated in noted he was going to send referral in for patient to see Ernestina Patches please advise if this is still the case patient called in asking why she has not received a call yet for injection in her back

## 2022-05-28 ENCOUNTER — Ambulatory Visit: Payer: Self-pay

## 2022-05-30 ENCOUNTER — Encounter: Payer: Self-pay | Admitting: Physical Medicine and Rehabilitation

## 2022-05-30 ENCOUNTER — Ambulatory Visit (INDEPENDENT_AMBULATORY_CARE_PROVIDER_SITE_OTHER): Payer: Self-pay | Admitting: Physical Medicine and Rehabilitation

## 2022-05-30 DIAGNOSIS — M5116 Intervertebral disc disorders with radiculopathy, lumbar region: Secondary | ICD-10-CM

## 2022-05-30 DIAGNOSIS — M47816 Spondylosis without myelopathy or radiculopathy, lumbar region: Secondary | ICD-10-CM

## 2022-05-30 DIAGNOSIS — M5416 Radiculopathy, lumbar region: Secondary | ICD-10-CM

## 2022-05-30 NOTE — Progress Notes (Signed)
Kari Ortega - 42 y.o. female MRN [id number]  Date of birth: [birth year]  Office Visit Note: Visit Date: 05/30/2022 PCP: Ladell Pier, MD Referred by: Ladell Pier, MD  Subjective: Chief Complaint  Patient presents with   Right Hip - Pain   Left Hip - Pain   HPI: Kari Ortega is a 42 y.o. female who comes in today per the request of Dr. Eduard Roux for evaluation of chronic, worsening and severe bilateral lower back pain radiating to lateral hips down anterior leg to foot. Spanish interpreter present during our visit today. Pain ongoing for several years and worsens with movement and activity. She describes pain as sore and aching, currently rates as 8 out of 10. Patient reports some relief of pain with home exercise regimen, rest and use of over-the-counter medications.  History of f formal physical therapy at [facility], reports significant relief of pain with these treatments until recently. Lumbar MRI from 2022 exhibits small right foraminal to extraforaminal disc protrusion noted at L4-L5 that could be irritating the exiting right L4 nerve root. No high grade spinal canal stenosis noted. During our last office visit in February of 2023 we discussed possibility of injection, however patient elected to hold on procedure at that time. States her pain has become severe and would like to schedule injection.  Patient continues to be managed by Dr. Greta Doom from an orthopedic standpoint, no relief of pain with previous bilateral greater trochanter injections.  Patient denies focal weakness, numbness and tingling. No recent trauma or falls.    Review of Systems  Musculoskeletal:  Positive for back pain.  Neurological:  Negative for tingling, sensory change, focal weakness and weakness.  All other systems reviewed and are negative.  Otherwise per HPI.  Assessment & Plan: Visit Diagnoses:    ICD-10-CM   1. Lumbar  radiculopathy  M54.16 Ambulatory referral to Physical Medicine Rehab    2. Spondylosis without myelopathy or radiculopathy, lumbar region  M47.816 Ambulatory referral to Physical Medicine Rehab    3. Intervertebral disc disorders with radiculopathy, lumbar region  M51.16 Ambulatory referral to Physical Medicine Rehab       Plan: Findings:  Chronic, worsening and severe bilateral lower back pain radiating to lateral hips down anterior leg to foot. Patient continues to have severe pain despite good conservative therapy such as formal physical therapy, home exercise regimen, rest and use of medications.  Patient's clinical presentation and exam are consistent with L4 nerve pattern.  Next step is to perform diagnostic and hopefully therapeutic bilateral L4 transforaminal epidural steroid injection under fluoroscopic guidance.  If good relief of pain with injection we can repeat this procedure infrequently as needed.  I did discuss lumbar epidural steroid injection procedure in detail today, she has no further questions at this time.  Patient encouraged to continue with current medication regimen and exercise routine.  No red flag symptoms noted upon exam today.    Meds & Orders: No orders of the defined types were placed in this encounter.   Orders Placed This Encounter  Procedures   Ambulatory referral to Physical Medicine Rehab    Follow-up: Return for Bilateral L4 transforaminal epidural steroid injection.   Procedures: No procedures performed      Clinical History: EXAM: MRI LUMBAR SPINE WITHOUT CONTRAST   TECHNIQUE: Multiplanar, multisequence MR imaging of the lumbar spine was performed. No intravenous contrast was administered.   COMPARISON:  Prior radiograph from 01/03/2021 as well as previous  MRI from 07/05/2019.   FINDINGS: Segmentation: Standard. Lowest well-formed disc space labeled the L5-S1 level.   Alignment: Physiologic with preservation of the normal  lumbar lordosis. No listhesis.   Vertebrae: Vertebral body height maintained without acute or chronic fracture. Bone marrow signal intensity within normal limits. No discrete or worrisome osseous lesions or abnormal marrow edema.   Conus medullaris and cauda equina: Conus extends to the T12-L1 level. Conus and cauda equina appear normal.   Paraspinal and other soft tissues: Unremarkable.   Disc levels:   L1-2:  Unremarkable.   L2-3:  Unremarkable.   L3-4:  Unremarkable.   L4-5: Mild disc bulge. Superimposed small right foraminal to extraforaminal disc protrusion closely approximates the exiting right L4 nerve root (series 5, image 26). Mild bilateral facet hypertrophy. Resultant mild narrowing of the lateral recesses bilaterally. Central canal remains patent. Mild bilateral L4 foraminal stenosis. Appearance is progressed from prior.   L5-S1: Minimal posterior disc bulge again seen closely approximating the descending S1 nerve roots without frank impingement or displacement. No significant canal or lateral recess stenosis. Foramina remain patent. Appearance is stable.   IMPRESSION: 1. Small right foraminal to extraforaminal disc protrusion at L4-5, closely approximating and potentially irritating the exiting right L4 nerve root. This is new from previous. 2. Minimal posterior disc bulge at L5-S1 without stenosis or neural impingement, stable.     Electronically Signed   By: Jeannine Boga M.D.   On: 04/18/2021 05:05   She reports that she has never smoked. She has never used smokeless tobacco.  Recent Labs    07/05/21 1105  HGBA1C 5.6    Objective:  VS:  HT:    WT:   BMI:     BP:   HR: bpm  TEMP: ( )  RESP:  Physical Exam Vitals and nursing note reviewed.  HENT:     Head: Normocephalic and atraumatic.     Right Ear: External ear normal.     Left Ear: External ear normal.     Nose: Nose normal.     Mouth/Throat:     Mouth: Mucous membranes are  moist.  Eyes:     Extraocular Movements: Extraocular movements intact.  Cardiovascular:     Rate and Rhythm: Normal rate.     Pulses: Normal pulses.  Pulmonary:     Effort: Pulmonary effort is normal.  Abdominal:     General: Abdomen is flat. There is no distension.  Musculoskeletal:        General: Tenderness present.     Cervical back: Normal range of motion.     Comments: Pt rises from seated position to standing without difficulty. Good lumbar range of motion. Strong distal strength without clonus, no pain upon palpation of greater trochanters. Sensation intact bilaterally. Dysesthesias noted to bilateral L4 dermatomes. Walks independently, gait steady.   Skin:    General: Skin is warm and dry.     Capillary Refill: Capillary refill takes less than 2 seconds.  Neurological:     General: No focal deficit present.     Mental Status: She is alert and oriented to person, place, and time.  Psychiatric:        Mood and Affect: Mood normal.        Behavior: Behavior normal.     Ortho Exam  Imaging: No results found.  Past Medical/Family/Surgical/Social History: Medications & Allergies reviewed per EMR, new medications updated. 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  Unable to determine reason per pt.      Irregular periods    Family History  Problem Relation Age of Onset   Diabetes Mother    Past Surgical History:  Procedure Laterality Date   CHOLECYSTECTOMY N/A 06/14/2018   Procedure: LAPAROSCOPIC CHOLECYSTECTOMY;  Surgeon: [name], [name], MD;  Location: [facility];  Service: General;  Laterality: N/A;   TOOTH EXTRACTION     Social History   Occupational History   Not on file  Tobacco Use   Smoking status: Never   Smokeless tobacco: Never  Vaping Use   Vaping Use: Never used  Substance and Sexual Activity   Alcohol use: No   Drug use: No   Sexual activity: Yes    Birth control/protection: None
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
## 2022-06-09 ENCOUNTER — Ambulatory Visit: Payer: Self-pay

## 2022-06-09 ENCOUNTER — Ambulatory Visit (INDEPENDENT_AMBULATORY_CARE_PROVIDER_SITE_OTHER): Payer: Self-pay | Admitting: Physical Medicine and Rehabilitation

## 2022-06-09 VITALS — BP 134/87 | HR 73

## 2022-06-09 DIAGNOSIS — M5416 Radiculopathy, lumbar region: Secondary | ICD-10-CM

## 2022-06-09 MED ORDER — METHYLPREDNISOLONE ACETATE 80 MG/ML IJ SUSP
80.0000 mg | Freq: Once | INTRAMUSCULAR | Status: AC
  Administered 2022-06-09: 80 mg

## 2022-06-09 NOTE — Patient Instructions (Signed)

## 2022-06-09 NOTE — Progress Notes (Signed)
Functional Pain Scale - descriptive words and definitions  Unmanageable (7)  Pain interferes with normal ADL's/nothing seems to help/sleep is very difficult/active distractions are very difficult to concentrate on. Severe range order  Average Pain 8   +Driver, -BT, -Dye Allergies.  Lower back pain on both sides. Pain when standing or walking after about 45 minutes

## 2022-06-11 NOTE — Procedures (Signed)
Lumbosacral Transforaminal Epidural Steroid Injection - Sub-Pedicular Approach with Fluoroscopic Guidance  Patient: Kari Ortega      Date of Birth: 06/11/1980 MRN: [id number] PCP: Ladell Pier, MD      Visit Date: 06/09/2022   Universal Protocol:    Date/Time: 06/09/2022  Consent Given By: the patient  Position: PRONE  Additional Comments: Vital signs were monitored before and after the procedure. Patient was prepped and draped in the usual sterile fashion. The correct patient, procedure, and site was verified.   Injection Procedure Details:   Procedure diagnoses: Lumbar radiculopathy [M54.16]    Meds Administered:  Meds ordered this encounter  Medications   methylPREDNISolone acetate (DEPO-MEDROL) injection 80 mg    Laterality: Bilateral  Location/Site: L4  Needle:5.0 in., 22 ga.  Short bevel or Quincke spinal needle  Needle Placement: Transforaminal  Findings:    -Comments: Excellent flow of contrast along the nerve, nerve root and into the epidural space.  Procedure Details: After squaring off the end-plates to get a true AP view, the C-arm was positioned so that an oblique view of the foramen as noted above was visualized. The target area is just inferior to the "nose of the scotty dog" or sub pedicular. The soft tissues overlying this structure were infiltrated with 2-3 ml. of 1% Lidocaine without Epinephrine.  The spinal needle was inserted toward the target using a "trajectory" view along the fluoroscope beam.  Under AP and lateral visualization, the needle was advanced so it did not puncture dura and was located close the 6 O'Clock position of the pedical in AP tracterory. Biplanar projections were used to confirm position. Aspiration was confirmed to be negative for CSF and/or blood. A 1-2 ml. volume of Isovue-250 was injected and flow of contrast was noted at each level. Radiographs were obtained for documentation purposes.   After  attaining the desired flow of contrast documented above, a 0.5 to 1.0 ml test dose of 0.25% Marcaine was injected into each respective transforaminal space.  The patient was observed for 90 seconds post injection.  After no sensory deficits were reported, and normal lower extremity motor function was noted,   the above injectate was administered so that equal amounts of the injectate were placed at each foramen (level) into the transforaminal epidural space.   Additional Comments:  No complications occurred Dressing: 2 x 2 sterile gauze and Band-Aid    Post-procedure details: Patient was observed during the procedure. Post-procedure instructions were reviewed.  Patient left the clinic in stable condition.

## 2022-06-11 NOTE — Progress Notes (Signed)
Kari Ortega - 42 y.o. female MRN [id number]  Date of birth: [birth year]  Office Visit Note: Visit Date: 06/09/2022 PCP: Ladell Pier, MD Referred by: Ladell Pier, MD  Subjective: Chief Complaint  Patient presents with   Lower Back - Pain   HPI:  [name] is a 42 y.o. female who comes in today at the request of Barnet Pall, FNP for planned Bilateral L4-5 Lumbar Transforaminal epidural steroid injection with fluoroscopic guidance.  The patient has failed conservative care including home exercise, medications, time and activity modification.  This injection will be diagnostic and hopefully therapeutic.  Please see requesting physician notes for further details and justification.   ROS Otherwise per HPI.  Assessment & Plan: Visit Diagnoses:    ICD-10-CM   1. Lumbar radiculopathy  M54.16 XR C-ARM NO REPORT    Epidural Steroid injection    methylPREDNISolone acetate (DEPO-MEDROL) injection 80 mg      Plan: No additional findings.   Meds & Orders:  Meds ordered this encounter  Medications   methylPREDNISolone acetate (DEPO-MEDROL) injection 80 mg    Orders Placed This Encounter  Procedures   XR C-ARM NO REPORT   Epidural Steroid injection    Follow-up: Return for visit to requesting provider as needed.   Procedures: No procedures performed  Lumbosacral Transforaminal Epidural Steroid Injection - Sub-Pedicular Approach with Fluoroscopic Guidance  Patient: [name]      Date of Birth: 03/13/1981 MRN: [id number] PCP: Ladell Pier, MD      Visit Date: 06/09/2022   Universal Protocol:    Date/Time: 06/09/2022  Consent Given By: the patient  Position: PRONE  Additional Comments: Vital signs were monitored before and after the procedure. Patient was prepped and draped in the usual sterile fashion. The correct patient, procedure, and site was verified.   Injection Procedure Details:    Procedure diagnoses: Lumbar radiculopathy [M54.16]    Meds Administered:  Meds ordered this encounter  Medications   methylPREDNISolone acetate (DEPO-MEDROL) injection 80 mg    Laterality: Bilateral  Location/Site: L4  Needle:5.0 in., 22 ga.  Short bevel or Quincke spinal needle  Needle Placement: Transforaminal  Findings:    -Comments: Excellent flow of contrast along the nerve, nerve root and into the epidural space.  Procedure Details: After squaring off the end-plates to get a true AP view, the C-arm was positioned so that an oblique view of the foramen as noted above was visualized. The target area is just inferior to the "nose of the scotty dog" or sub pedicular. The soft tissues overlying this structure were infiltrated with 2-3 ml. of 1% Lidocaine without Epinephrine.  The spinal needle was inserted toward the target using a "trajectory" view along the fluoroscope beam.  Under AP and lateral visualization, the needle was advanced so it did not puncture dura and was located close the 6 O'Clock position of the pedical in AP tracterory. Biplanar projections were used to confirm position. Aspiration was confirmed to be negative for CSF and/or blood. A 1-2 ml. volume of Isovue-250 was injected and flow of contrast was noted at each level. Radiographs were obtained for documentation purposes.   After attaining the desired flow of contrast documented above, a 0.5 to 1.0 ml test dose of 0.25% Marcaine was injected into each respective transforaminal space.  The patient was observed for 90 seconds post injection.  After no sensory deficits were reported, and normal lower extremity motor function was noted,   the above injectate  was administered so that equal amounts of the injectate were placed at each foramen (level) into the transforaminal epidural space.   Additional Comments:  No complications occurred Dressing: 2 x 2 sterile gauze and Band-Aid    Post-procedure  details: Patient was observed during the procedure. Post-procedure instructions were reviewed.  Patient left the clinic in stable condition.    Clinical History: EXAM: MRI LUMBAR SPINE WITHOUT CONTRAST   TECHNIQUE: Multiplanar, multisequence MR imaging of the lumbar spine was performed. No intravenous contrast was administered.   COMPARISON:  Prior radiograph from 01/03/2021 as well as previous MRI from 07/05/2019.   FINDINGS: Segmentation: Standard. Lowest well-formed disc space labeled the L5-S1 level.   Alignment: Physiologic with preservation of the normal lumbar lordosis. No listhesis.   Vertebrae: Vertebral body height maintained without acute or chronic fracture. Bone marrow signal intensity within normal limits. No discrete or worrisome osseous lesions or abnormal marrow edema.   Conus medullaris and cauda equina: Conus extends to the T12-L1 level. Conus and cauda equina appear normal.   Paraspinal and other soft tissues: Unremarkable.   Disc levels:   L1-2:  Unremarkable.   L2-3:  Unremarkable.   L3-4:  Unremarkable.   L4-5: Mild disc bulge. Superimposed small right foraminal to extraforaminal disc protrusion closely approximates the exiting right L4 nerve root (series 5, image 26). Mild bilateral facet hypertrophy. Resultant mild narrowing of the lateral recesses bilaterally. Central canal remains patent. Mild bilateral L4 foraminal stenosis. Appearance is progressed from prior.   L5-S1: Minimal posterior disc bulge again seen closely approximating the descending S1 nerve roots without frank impingement or displacement. No significant canal or lateral recess stenosis. Foramina remain patent. Appearance is stable.   IMPRESSION: 1. Small right foraminal to extraforaminal disc protrusion at L4-5, closely approximating and potentially irritating the exiting right L4 nerve root. This is new from previous. 2. Minimal posterior disc bulge at L5-S1 without  stenosis or neural impingement, stable.     Electronically Signed   By: Jeannine Boga M.D.   On: 04/18/2021 05:05     Objective:  VS:  HT:    WT:   BMI:     BP:134/87  HR:73bpm  TEMP: ( )  RESP:  Physical Exam Vitals and nursing note reviewed.  Constitutional:      General: She is not in acute distress.    Appearance: Normal appearance. She is not ill-appearing.  HENT:     Head: Normocephalic and atraumatic.     Right Ear: External ear normal.     Left Ear: External ear normal.  Eyes:     Extraocular Movements: Extraocular movements intact.  Cardiovascular:     Rate and Rhythm: Normal rate.     Pulses: Normal pulses.  Pulmonary:     Effort: Pulmonary effort is normal. No respiratory distress.  Abdominal:     General: There is no distension.     Palpations: Abdomen is soft.  Musculoskeletal:        General: Tenderness present.     Cervical back: Neck supple.     Right lower leg: No edema.     Left lower leg: No edema.     Comments: Patient has good distal strength with no pain over the greater trochanters.  No clonus or focal weakness.  Skin:    Findings: No erythema, lesion or rash.  Neurological:     General: No focal deficit present.     Mental Status: She is alert and oriented to person, place,  and time.     Sensory: No sensory deficit.     Motor: No weakness or abnormal muscle tone.     Coordination: Coordination normal.  Psychiatric:        Mood and Affect: Mood normal.        Behavior: Behavior normal.      Imaging: No results found.

## 2022-06-23 ENCOUNTER — Encounter: Payer: Self-pay | Admitting: Obstetrics and Gynecology

## 2022-06-23 ENCOUNTER — Ambulatory Visit (INDEPENDENT_AMBULATORY_CARE_PROVIDER_SITE_OTHER): Payer: Self-pay | Admitting: Obstetrics and Gynecology

## 2022-06-23 VITALS — BP 112/78 | HR 75 | Wt 185.0 lb

## 2022-06-23 DIAGNOSIS — Z789 Other specified health status: Secondary | ICD-10-CM

## 2022-06-23 DIAGNOSIS — N939 Abnormal uterine and vaginal bleeding, unspecified: Secondary | ICD-10-CM

## 2022-06-23 NOTE — Progress Notes (Signed)
Obstetrics and Gynecology New Patient Evaluation  Appointment Date: 06/23/2022  OBGYN Clinic: [facility]  Primary Care Provider: Ladell Pier  Referring Provider: Argentina Donovan, PA-C  Chief Complaint:  Chief Complaint  Patient presents with   Menstrual Problem    History of Present Illness: Kari Ortega is a 42 y.o. [phone] (Patient's last menstrual period was 06/16/2022 (exact date).), seen for the above chief complaint.   Patient states she had a time period of about 3-4 months mid to late last year where she had spotting in between her periods. She states her periods can be up to 6-8 weeks apart and that her periods went back to that late last year and she had a period last month and in December. She saw her PCP for this last year and had a normal CBC, TSH, STD and BV/yeast swab. She has a h/o back pain and had a CT scan early 2023 that didn't show any obvious GYN abnormalities  No current issues. She denies any menopausal s/s  Review of Systems: Pertinent items noted in HPI and remainder of comprehensive ROS otherwise negative.   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  Unable to determine reason per pt.     Irregular periods     Past Surgical History:  Past Surgical History:  Procedure Laterality Date   CHOLECYSTECTOMY N/A 06/14/2018   Procedure: LAPAROSCOPIC CHOLECYSTECTOMY;  Surgeon: [name], [name], MD;  Location: [place];   Service: General;  Laterality: N/A;   TOOTH EXTRACTION      Past Obstetrical History:  OB History  Gravida Para Term Preterm AB Living  5 3 3   2 3  $ SAB IAB Ectopic Multiple Live Births  1   1 0 3    # Outcome Date GA Lbr Len/2nd Weight Sex Delivery Anes PTL Lv  5 Term 03/18/18 [id number]:11 / 00:22 7 lb 0.9 oz (3.2 kg) M Vag-Spont None  LIV  4 Term 2009    M Vag-Spont   LIV     Birth Comments: had headache and ear ache after delivery no spinal or epidural  3 SAB 2008          2 Term      Vag-Spont   LIV  1 Ectopic             Past Gynecological History: As per HPI. History of Pap Smear(s): Yes.   Last pap August 2021, which was cytology and HPV negative She is currently using condoms for contraception.   Social History:  Social History   Socioeconomic History   Marital status: Married    Spouse name: Not on file   Number of children: Not on file   Years of education: Not on file   Highest education level: Not on file  Occupational History   Not on file  Tobacco Use   Smoking status: Never   Smokeless tobacco: Never  Vaping Use   Vaping Use: Never  used  Substance and Sexual Activity   Alcohol use: No   Drug use: No   Sexual activity: Yes    Birth control/protection: None  Other Topics Concern   Not on file  Social History Narrative   Right handed   Social Determinants of Health   Financial Resource Strain: Not on file  Food Insecurity: Not on file  Transportation Needs: Not on file  Physical Activity: Not on file  Stress: Not on file  Social Connections: Not on file  Intimate Partner Violence: Not on file    Family History:  Family History  Problem Relation Age of Onset   Diabetes Mother     Medications Aarionna M. Perez-Mendez had no medications administered during this visit. Current Outpatient Medications  Medication Sig Dispense Refill   ferrous sulfate 325 (65 FE) MG tablet Take 1 tablet (325 mg total) by mouth daily with breakfast. 100 tablet  0   methocarbamol (ROBAXIN) 500 MG tablet Take 2 tablets (1,000 mg total) by mouth every 8 (eight) hours as needed for muscle spasms. 90 tablet 0   amitriptyline (ELAVIL) 10 MG tablet Take 1 tablet (10 mg total) by mouth at bedtime. (Patient not taking: Reported on 06/23/2022) 30 tablet 5   celecoxib (CELEBREX) 200 MG capsule Take 1 capsule (200 mg total) by mouth daily. Prn pain (Patient not taking: Reported on 06/23/2022) 30 capsule 1   Current Facility-Administered Medications  Medication Dose Route Frequency Provider Last Rate Last Admin   polyethylene glycol powder (GLYCOLAX/MIRALAX) container 255 g  1 Container Oral Once Rasch, Artist Pais, NP        Allergies Patient has no known allergies.   Physical Exam:  BP 112/78   Pulse 75   Wt 185 lb (83.9 kg)   LMP 06/16/2022 (Exact Date)   Breastfeeding No   BMI 43.00 kg/m  Body mass index is 43 kg/m. General appearance: Well nourished, well developed female in no acute distress.  Respiratory: Normal respiratory effort Abdomen: positive bowel sounds and no masses, hernias; diffusely non tender to palpation, non distended Neuro/Psych:  Normal mood and affect.  Skin:  Warm and dry.  Lymphatic:  No inguinal lymphadenopathy.   Pelvic exam: is not limited by body habitus EGBUS: within normal limits Vagina: within normal limits and with no blood or discharge in the vault Cervix: normal appearing cervix without tenderness, discharge or lesions. Uterus:  nonenlarged and non tender Adnexa:  normal adnexa and no mass, fullness, tenderness Rectovaginal: deferred  Laboratory: as per hpi  Radiology: as per hpi  Assessment: patient doing well  Plan:  1. Abnormal uterine bleeding (AUB) None currently. I told her that as long as she has regular periods and no current AUB then nothing to do. If s/s come back, told to call us and let us know  D/w her needs routine pap later this year and can contact us if has no insurance for [facility]  coverage. Message sent to [facility] for mammogram  2. Language barrier In person interpeter used  RTC PRN  Durene Romans MD Attending Center for [facility])

## 2022-06-24 ENCOUNTER — Encounter: Payer: Self-pay | Admitting: Physical Therapy

## 2022-06-24 ENCOUNTER — Ambulatory Visit: Payer: Self-pay | Attending: Internal Medicine | Admitting: Physical Therapy

## 2022-06-24 DIAGNOSIS — M25552 Pain in left hip: Secondary | ICD-10-CM | POA: Insufficient documentation

## 2022-06-24 DIAGNOSIS — G8929 Other chronic pain: Secondary | ICD-10-CM | POA: Insufficient documentation

## 2022-06-24 DIAGNOSIS — R2689 Other abnormalities of gait and mobility: Secondary | ICD-10-CM | POA: Insufficient documentation

## 2022-06-24 DIAGNOSIS — M25551 Pain in right hip: Secondary | ICD-10-CM | POA: Insufficient documentation

## 2022-06-24 DIAGNOSIS — M545 Low back pain, unspecified: Secondary | ICD-10-CM | POA: Insufficient documentation

## 2022-06-24 DIAGNOSIS — M6281 Muscle weakness (generalized): Secondary | ICD-10-CM | POA: Insufficient documentation
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
## 2022-06-25 ENCOUNTER — Other Ambulatory Visit: Payer: Self-pay | Admitting: Obstetrics and Gynecology

## 2022-06-25 DIAGNOSIS — Z1231 Encounter for screening mammogram for malignant neoplasm of breast: Secondary | ICD-10-CM

## 2022-07-17 ENCOUNTER — Ambulatory Visit
Admission: RE | Admit: 2022-07-17 | Discharge: 2022-07-17 | Disposition: A | Discharge status: Home / Self Care | Payer: No Typology Code available for payment source | Source: Ambulatory Visit | Attending: Obstetrics and Gynecology | Admitting: Obstetrics and Gynecology

## 2022-07-17 ENCOUNTER — Ambulatory Visit: Payer: Self-pay | Admitting: Hematology and Oncology

## 2022-07-17 VITALS — BP 112/84 | Wt 188.0 lb

## 2022-07-17 DIAGNOSIS — Z1231 Encounter for screening mammogram for malignant neoplasm of breast: Secondary | ICD-10-CM

## 2022-07-17 NOTE — Patient Instructions (Signed)
Taught Kari Ortega about self breast awareness and gave educational materials to take home. Patient did not need a Pap smear today due to last Pap smear was in 2021 per patient. Let her know [facility] will cover Pap smears every 5 years unless has a history of abnormal Pap smears. Referred patient to the [facility] for screening mammogram. Appointment scheduled for 07/17/22. Patient aware of appointment and will be there. Let patient know will follow up with her within the next couple weeks with results. Kari Ortega verbalized understanding.  Melodye Ped, NP 12:52 PM

## 2022-07-17 NOTE — Progress Notes (Signed)
Ms. Kari Ortega is a 42 y.o. female who presents to [facility] clinic today with no complaints.    Pap Smear: Pap not smear completed today. Last Pap smear was 2021 and was normal. Per patient has no history of an abnormal Pap smear. Last Pap smear result is available in Epic.   Physical exam: Breasts Breasts symmetrical. No skin abnormalities bilateral breasts. No nipple retraction bilateral breasts. No nipple discharge bilateral breasts. No lymphadenopathy. No lumps palpated bilateral breasts.        Pelvic/Bimanual Pap is not indicated today    Smoking History: Patient has never smoked and was not referred to quit line.    Patient Navigation: Patient education provided. Access to services provided for patient through [place] interpreter provided. No transportation provided   Colorectal Cancer Screening: Per patient has never had colonoscopy completed No complaints today.    Breast and Cervical Cancer Risk Assessment: Patient does not have family history of breast cancer, known genetic mutations, or radiation treatment to the chest before age 10. Patient does not have history of cervical dysplasia, immunocompromised, or DES exposure in-utero.  Risk Assessment   No risk assessment data     A: [facility] exam without pap smear No complaints with benign exam.   P: Referred patient to the [facility] for a screening mammogram. Appointment scheduled 07/17/22.  Melodye Ped, NP 07/17/2022 12:49 PM

## 2022-07-29 ENCOUNTER — Ambulatory Visit: Payer: Self-pay | Admitting: Internal Medicine

## 2022-08-22 ENCOUNTER — Ambulatory Visit: Payer: Self-pay | Attending: Internal Medicine | Admitting: Internal Medicine

## 2022-08-22 ENCOUNTER — Encounter: Payer: Self-pay | Admitting: Internal Medicine

## 2022-08-22 ENCOUNTER — Other Ambulatory Visit: Payer: Self-pay | Admitting: Internal Medicine

## 2022-08-22 VITALS — BP 113/76 | HR 68 | Temp 97.9°F | Ht <= 58 in | Wt 178.0 lb

## 2022-08-22 DIAGNOSIS — M545 Low back pain, unspecified: Secondary | ICD-10-CM

## 2022-08-22 DIAGNOSIS — R1012 Left upper quadrant pain: Secondary | ICD-10-CM

## 2022-08-22 DIAGNOSIS — E611 Iron deficiency: Secondary | ICD-10-CM

## 2022-08-22 DIAGNOSIS — G8929 Other chronic pain: Secondary | ICD-10-CM
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
## 2022-08-23 ENCOUNTER — Other Ambulatory Visit: Payer: Self-pay | Admitting: Internal Medicine

## 2022-08-23 LAB — CBC
Hematocrit: 36.3 % (ref 34.0–46.6)
Hemoglobin: 11.5 g/dL (ref 11.1–15.9)
MCH: 25.6 pg — ABNORMAL LOW (ref 26.6–33.0)
MCHC: 31.7 g/dL (ref 31.5–35.7)
MCV: 81 fL (ref 79–97)
Platelets: 330 10*3/uL (ref 150–450)
RBC: 4.5 x10E6/uL (ref 3.77–5.28)
RDW: 14.4 % (ref 11.7–15.4)
WBC: 7.9 10*3/uL (ref 3.4–10.8)

## 2022-08-23 MED ORDER — FERROUS SULFATE 325 (65 FE) MG PO TABS
ORAL_TABLET | ORAL | 0 refills | Status: DC
  Filled 2022-08-23: qty 12, 84d supply, fill #0

## 2022-08-25 ENCOUNTER — Other Ambulatory Visit: Payer: Self-pay

## 2022-08-29 ENCOUNTER — Other Ambulatory Visit: Payer: Self-pay

## 2022-09-16 ENCOUNTER — Ambulatory Visit: Payer: Self-pay | Attending: Internal Medicine

## 2022-09-24 ENCOUNTER — Ambulatory Visit (INDEPENDENT_AMBULATORY_CARE_PROVIDER_SITE_OTHER): Payer: Self-pay | Admitting: Internal Medicine

## 2022-09-24 ENCOUNTER — Other Ambulatory Visit: Payer: Self-pay

## 2022-09-24 ENCOUNTER — Encounter: Payer: Self-pay | Admitting: Internal Medicine

## 2022-09-24 VITALS — BP 110/68 | HR 67 | Ht <= 58 in | Wt 178.0 lb

## 2022-09-24 DIAGNOSIS — D3502 Benign neoplasm of left adrenal gland: Secondary | ICD-10-CM

## 2022-09-24 MED ORDER — DEXAMETHASONE 1 MG PO TABS
1.0000 mg | ORAL_TABLET | Freq: Once | ORAL | 0 refills | Status: AC
  Filled 2022-09-24 (×2): qty 1, 1d supply, fill #0

## 2022-09-24 NOTE — Patient Instructions (Signed)
Instructions for Dexamethasone Suppression Test   Step 1: Choose a morning when you can come to our lab at 8:00 am for a blood draw.   Step 2: On the night before the blood draw, take one 1 mg tablet of dexamethasone at 11:30 pm.  The timing is VERY important!   Step 3: The next morning, go to the lab for blood work at 8:00 am.  You do not have to be on an empty stomach, but the timing is VERY important!    Instrucciones para la prueba de supresin con dexametasona  Paso 1: Elija una maana en la que pueda venir a nuestro laboratorio a las 8:00 a. m. para una extraccin de sangre.  Paso 2: La noche anterior a la extraccin de sangre, tome un comprimido de dexametasona de 1 mg a las 23:30 horas. El momento es MUY importante!  Paso 3: A la maana siguiente, vaya al laboratorio para hacerse un anlisis de sangre a las 8:00 a. m. No es necesario que ests con el estmago vaco, pero el momento es MUY importante! 

## 2022-09-24 NOTE — Progress Notes (Signed)
Name: Kari Ortega  MRN/ DOB: [id number], 1980/11/11    Age/ Sex: 42 y.o., female    PCP: Marcine Matar, MD   Reason for Endocrinology Evaluation: Adrenal adenoma     Date of Initial Endocrinology Evaluation: 03/26/2022    HPI: Ms. Kari Ortega is a 42 y.o. female with a past medical history of dyslipidemia and degenerative joint disease. The patient presented for initial endocrinology clinic visit on 03/26/2022 for consultative assistance with her adrenal adenoma.    During evaluation for abdominal pain in February 2023 she was noted to have a left 1.3 cm fat-containing adrenal adenoma on CT imaging of the abdomen.   In reviewing her records she has been noted with normal metanephrines, cortisol, renin, Aldo, and Aldo/renin ratio 07/2021 Dexamethasone suppression test was normal at 0.2 UG/DL 40/9811     SUBJECTIVE:    Today (09/24/22):  Kari Ortega is here for follow-up on adrenal adenoma.  Interpreter line was used today    Substantial weight gain- weight has been fluctuating  Severe hypertension- no  DM- no Sudden/ severe headaches- has occasional right sided headaches  Anxiety attacks- rare  Cardiac arrhythmias- no Palpitations- no except when she lost her father ~ 6 weeks  Fluid retention- no    She has been exercising 20 minutes a day  She continues with occasional snacking  She drinks occasional sugar-sweetened beverages     HISTORY:  Past Medical History:  Past Medical History:  Diagnosis Date   Anemia    Facial paralysis    c/o facial paralysis with last deliveries.  Unable to determine reason per pt.     Irregular periods    Past Surgical History:  Past Surgical History:  Procedure Laterality Date   CHOLECYSTECTOMY N/A 06/14/2018   Procedure: LAPAROSCOPIC CHOLECYSTECTOMY;  Surgeon: [name], De Blanch, MD;  Location: [facility] OR;  Service: General;  Laterality: N/A;   TOOTH EXTRACTION       Social History:  reports that she has never smoked. She has never used smokeless tobacco. She reports that she does not drink alcohol and does not use drugs. Family History: family history includes Diabetes in her mother.   HOME MEDICATIONS: Allergies as of 09/24/2022   No Known Allergies      Medication List        Accurate as of Sep 24, 2022  9:37 AM. If you have any questions, ask your nurse or doctor.          amitriptyline 10 MG tablet Commonly known as: ELAVIL Take 1 tablet (10 mg total) by mouth at bedtime.   FeroSul 325 (65 FE) MG tablet Generic drug: ferrous sulfate Take 1 tablet by mouth every Monday, Wednesday, and Friday.   methocarbamol 500 MG tablet Commonly known as: ROBAXIN Take 2 tablets (1,000 mg total) by mouth every 8 (eight) hours as needed for muscle spasms.          REVIEW OF SYSTEMS: A comprehensive ROS was conducted with the patient and is negative except as per HPI   OBJECTIVE:  VS: BP 110/68 (BP Location: Left Arm, Patient Position: Sitting, Cuff Size: Large)   Pulse 67   Ht 4\' 7"  (1.397 m)   Wt 178 lb (80.7 kg)   SpO2 99%   BMI 41.37 kg/m    Wt Readings from Last 3 Encounters:  09/24/22 178 lb (80.7 kg)  08/22/22 178 lb (80.7 kg)  07/17/22 188 lb (85.3 kg)     EXAM:  General: Pt appears well and is in NAD  Neck: General: Supple without adenopathy. Thyroid: Thyroid size normal.  No goiter or nodules appreciated.  Lungs: Clear with good BS bilat   Heart: Auscultation: RRR.  Abdomen: soft, nontender  Extremities:  BL LE: No pretibial edema normal   Mental Status: Judgment, insight: Intact Orientation: Oriented to time, place, and person Mood and affect: No depression, anxiety, or agitation     DATA REVIEWED:   Latest Reference Range & Units Most Recent  ACTH 7.2 - 63.3 pg/mL 17.6 08/15/21 15:39  ALDOSTERONE 0.0 - 30.0 ng/dL 14.7 01/08/55 21:30  Cortisol - AM 6.2 - 19.4 ug/dL 6.3 8/65/78 46:96  Renin 0.167 - 5.380  ng/mL/hr 2.734 08/08/21 08:56  ALDOS/RENIN RATIO 0.0 - 30.0  7.8 08/08/21 08:56  Metanephrine, Pl 0.0 - 88.0 pg/mL <10.0 08/08/21 08:56  Normetanephrine, Pl 0.0 - 210.1 pg/mL 38.6 08/08/21 08:56    CT Abdomen 06/21/2021  Narrative & Impression  CLINICAL DATA:  Abdominal pain, acute, nonlocalized. Left lower quadrant abdominal pain and umbilical pain.    FINDINGS: Lower chest: Lung bases are clear except for a 3 mm calcified granuloma in the posterior left lower lobe.   Hepatobiliary: Previous cholecystectomy. Liver parenchyma is normal.   Pancreas: Normal   Spleen: Normal   Adrenals/Urinary Tract: Adrenal glands are normal except for a fat-density adenoma on the left measuring 13 mm. Contrast is present within the renal collecting systems, which could obscure small nonobstructing renal calculi. No hydronephrosis. No evidence of passing stone. No stone in the bladder.     Old records , labs and images have been reviewed.    ASSESSMENT/PLAN/RECOMMENDATIONS:   Left Adrenal Adenoma:   -She already had normal renin, Aldo, Aldo/renin ratio and metanephrines in March 2023, normal dexamethasone suppression test 03/2022 -There is no clinical evidence of hypersecretion at this time -Patient will return for fasting 8 AM labs for dexamethasone suppression test, Aldo, renin, and metanephrines  2.  Obesity, BMI 41:  -I have again encouraged the patient to avoid snacks, sugar sweetened beverages, and to continue with regular exercise -We discussed the risk of diabetes, joint pain, and cardiovascular events with obesity    I spent 28 minutes preparing to see the patient by review of recent labs, imaging and procedures, obtaining and reviewing separately obtained history, communicating with the patient, ordering medications, tests or procedures, and documenting clinical information in the EHR including the differential Dx, treatment, and any further evaluation and other management    Signed electronically by: Lyndle Herrlich, MD  [facility] Endocrinology  [facility] [address] [place], [place] [address] Phone: [phone] FAX: [phone]   CC: Marcine Matar, MD [address] [place] [address] Phone: [phone] Fax: [phone]   Return to Endocrinology clinic as below: Future Appointments  Date Time Provider Department Center  02/23/2023  3:30 PM Marcine Matar, MD [facility] None

## 2022-09-29 ENCOUNTER — Other Ambulatory Visit: Payer: Self-pay

## 2022-10-02 ENCOUNTER — Other Ambulatory Visit (INDEPENDENT_AMBULATORY_CARE_PROVIDER_SITE_OTHER): Payer: Self-pay

## 2022-10-02 DIAGNOSIS — D3502 Benign neoplasm of left adrenal gland: Secondary | ICD-10-CM

## 2022-10-02 LAB — BASIC METABOLIC PANEL
BUN: 15 mg/dL (ref 6–23)
CO2: 25 mEq/L (ref 19–32)
Calcium: 9.1 mg/dL (ref 8.4–10.5)
Chloride: 105 mEq/L (ref 96–112)
Creatinine, Ser: 0.65 mg/dL (ref 0.40–1.20)
GFR: 109.06 mL/min (ref >60.00)
Glucose, Bld: 122 mg/dL — ABNORMAL HIGH (ref 70–99)
Potassium: 4.1 mEq/L (ref 3.5–5.1)
Sodium: 136 mEq/L (ref 135–145)

## 2022-10-02 LAB — CORTISOL: Cortisol, Plasma: 0.3 ug/dL

## 2022-10-03 ENCOUNTER — Encounter: Payer: Self-pay | Admitting: Internal Medicine

## 2022-10-10 LAB — METANEPHRINES, PLASMA
Metanephrine, Free: <25 pg/mL (ref <=57)
Normetanephrine, Free: 33 pg/mL (ref <=148)
Total Metanephrines-Plasma: 33 pg/mL (ref <=205)

## 2022-10-10 LAB — ALDOSTERONE + RENIN ACTIVITY W/ RATIO
ALDO / PRA Ratio: 5.6 Ratio (ref 0.9–28.9)
Aldosterone: 14 ng/dL
Renin Activity: 2.51 ng/mL/h (ref 0.25–5.82)

## 2022-10-14 ENCOUNTER — Telehealth: Payer: Self-pay | Admitting: Internal Medicine

## 2022-10-14 NOTE — Telephone Encounter (Signed)
Returned pt's call - need new letter from spouse's employer w/ [employer] app; pt mentioned that she'd drop both items off on Friday afternoon - informed pt that I will be out of office on Friday, but will be back Monday and will submit her app then

## 2022-10-22 NOTE — Telephone Encounter (Signed)
The patient called back in stating she is going to drop off the card tomorrow concerning her spouses employer so her application can be processed.

## 2022-10-27 ENCOUNTER — Telehealth: Payer: Self-pay

## 2022-10-27 NOTE — Telephone Encounter (Signed)
Patient came in asking for referral to dentist that accepts the orange card/ financial assistance.

## 2022-10-28 ENCOUNTER — Ambulatory Visit: Payer: Self-pay

## 2022-10-29 ENCOUNTER — Ambulatory Visit: Payer: Self-pay

## 2022-10-29 ENCOUNTER — Other Ambulatory Visit: Payer: Self-pay | Admitting: Nurse Practitioner

## 2022-10-29 DIAGNOSIS — K089 Disorder of teeth and supporting structures, unspecified: Secondary | ICD-10-CM

## 2022-10-29 NOTE — Telephone Encounter (Signed)
Referral placed.

## 2022-11-04 ENCOUNTER — Ambulatory Visit: Payer: Self-pay | Attending: Internal Medicine

## 2022-11-20 ENCOUNTER — Other Ambulatory Visit: Payer: Self-pay

## 2022-11-20 ENCOUNTER — Encounter: Payer: Self-pay | Admitting: Physician Assistant

## 2022-11-20 ENCOUNTER — Ambulatory Visit: Payer: Self-pay | Attending: Physician Assistant | Admitting: Physician Assistant

## 2022-11-20 VITALS — BP 115/82 | HR 68 | Wt 183.0 lb

## 2022-11-20 DIAGNOSIS — G8929 Other chronic pain: Secondary | ICD-10-CM

## 2022-11-20 DIAGNOSIS — R739 Hyperglycemia, unspecified: Secondary | ICD-10-CM

## 2022-11-20 DIAGNOSIS — M545 Low back pain, unspecified: Secondary | ICD-10-CM

## 2022-11-20 DIAGNOSIS — Z603 Acculturation difficulty: Secondary | ICD-10-CM

## 2022-11-20 DIAGNOSIS — Z758 Other problems related to medical facilities and other health care: Secondary | ICD-10-CM

## 2022-11-20 DIAGNOSIS — K439 Ventral hernia without obstruction or gangrene: Secondary | ICD-10-CM

## 2022-11-20 LAB — POCT URINALYSIS DIP (CLINITEK)
Bilirubin, UA: NEGATIVE
Blood, UA: NEGATIVE
Glucose, UA: NEGATIVE mg/dL
Ketones, POC UA: NEGATIVE mg/dL
Leukocytes, UA: NEGATIVE
Nitrite, UA: NEGATIVE
POC PROTEIN,UA: NEGATIVE
Spec Grav, UA: >=1.03 — AB (ref 1.010–1.025)
Urobilinogen, UA: 0.2 E.U./dL
pH, UA: 6.5 (ref 5.0–8.0)

## 2022-11-20 MED ORDER — METHOCARBAMOL 500 MG PO TABS
1000.0000 mg | ORAL_TABLET | Freq: Three times a day (TID) | ORAL | 0 refills | Status: DC | PRN
  Filled 2022-11-20: qty 90, 15d supply, fill #0

## 2022-11-20 MED ORDER — MELOXICAM 15 MG PO TABS
15.0000 mg | ORAL_TABLET | Freq: Every day | ORAL | 1 refills | Status: DC
  Filled 2022-11-20: qty 30, 30d supply, fill #0

## 2022-11-20 NOTE — Progress Notes (Signed)
Patient ID: Kari Ortega, female   DOB: Jan 26, 1981, 42 y.o.   MRN: [id number]   Kari Ortega, is a 42 y.o. female  WUJ:[id number]  [id number]  DOB - [birth year]  Chief Complaint  Patient presents with   Abdominal Pain    Patient states that she feels a bulge and sometimes it comes out and is very painful       Subjective:   Kari Ortega is a 42 y.o. female here today for several concerns.  She is noticing a bulge in her abdomen when she coughs or strains.  This has been going on about 3 months.  This is painful when it occurs but goes away after a few moments.    She continues to have back pain and is in the process of getting orange card again.  She has previously had back injections that helped some but for now she needs something to ease the pain when it occurs.    She is also concerned bc her abdomen always seems bloated to her.  Weight is only about 1 pound heavier than a year ago at this time.  No N/V/D.  No changes in BM No problems updated.  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  Unable to determine reason per pt.     Irregular periods     MEDICATIONS AT HOME: Prior to Admission medications   Medication Sig Start Date End Date Taking? Authorizing Provider  meloxicam (MOBIC) 15 MG tablet Take 1 tablet (15 mg total) by mouth daily. as needed for pain 11/20/22  Yes Anders Simmonds, PA-C  amitriptyline (ELAVIL) 10 MG tablet Take 1 tablet (10 mg total) by mouth at bedtime. Patient not taking: Reported on 09/24/2022 12/12/20   Drema Dallas, DO  ferrous sulfate 325 (65 FE) MG tablet Take 1 tablet by mouth every Monday, Wednesday, and Friday. Patient not taking: Reported on 11/20/2022 08/23/22   Marcine Matar, MD  methocarbamol (ROBAXIN) 500 MG tablet Take 2 tablets (1,000 mg total) by mouth every 8 (eight)  hours as needed for muscle spasms. 11/20/22   Nicasio Barlowe, Marzella Schlein, PA-C    ROS: Neg HEENT Neg resp Neg cardiac Neg GI Neg GU Neg MS Neg psych Neg neuro  Objective:   Vitals:   11/20/22 1055  BP: 115/82  Pulse: 68  SpO2: 95%  Weight: 183 lb (83 kg)   Exam General appearance : Awake, alert, not in any distress. Speech Clear. Not toxic looking HEENT: Atraumatic and Normocephali Neck: Supple, no JVD. No cervical lymphadenopathy.  Chest: Good air entry bilaterally, CTAB.  No rales/rhonchi/wheezing CVS: S1 S2 regular, no murmurs.  Abdomen: obese.Bowel sounds present, Non tender and not distended with no gaurding, rigidity or rebound.  There is tenderness with cough and a small bulge in the L ventral abdomen about 4 cm lateral to umbilicus but easily reduces Extremities: B/L Lower Ext shows no edema, both legs are warm to touch Neurology: Awake alert, and oriented X 3, CN II-XII intact, Non focal Skin: No Rash  Data Review Lab Results  Component Value Date   HGBA1C 5.6 07/05/2021   HGBA1C 5.4 06/11/2020   HGBA1C 5.7 (H) 12/15/2019    Assessment & Plan   1. Ventral hernia without obstruction or gangrene-no red flags  - Ambulatory referral to General Surgery - POCT URINALYSIS DIP (CLINITEK)  2.  Hyperglycemia I have had a lengthy discussion and provided education about insulin resistance and the intake of too much sugar/refined carbohydrates.  I have advised the patient to work at a goal of eliminating sugary drinks, candy, desserts, sweets, refined sugars, processed foods, and white carbohydrates.  The patient expresses understanding.  - Hemoglobin A1c  3. Chronic bilateral low back pain without sciatica - methocarbamol (ROBAXIN) 500 MG tablet; Take 2 tablets (1,000 mg total) by mouth every 8 (eight) hours as needed for muscle spasms.  Dispense: 90 tablet; Refill: 0 - meloxicam (MOBIC) 15 MG tablet; Take 1 tablet (15 mg total) by mouth daily. as needed for pain  Dispense: 30  tablet; Refill: 1  4. Language barrier [employer] "Angie" interpreters used and additional time performing visit was required.     Return if symptoms worsen or fail to improve.  The patient was given clear instructions to go to ER or return to medical center if symptoms don't improve, worsen or new problems develop. The patient verbalized understanding. The patient was told to call to get lab results if they haven't heard anything in the next week.      Georgian Co, PA-C [facility] [place], [place] [phone]   11/20/2022, 12:38 PM

## 2022-11-20 NOTE — Patient Instructions (Signed)
Work at a goal of eliminating sugary drinks, candy, desserts, sweets, refined sugars, processed foods, and white carbohydrates.  Drink 80 ounces water daily

## 2022-11-21 ENCOUNTER — Telehealth: Payer: Self-pay

## 2022-11-21 LAB — HEMOGLOBIN A1C
Est. average glucose Bld gHb Est-mCnc: 111 mg/dL
Hgb A1c MFr Bld: 5.5 % (ref 4.8–5.6)

## 2022-11-21 NOTE — Telephone Encounter (Signed)
-----   Message from Georgian Co sent at 11/21/2022  1:00 PM EDT ----- No diabetes.  Continue limiting sugars and starches to reduce the likelihood of developing diabetes.  Eating less sugar and salt will reduce bloating.  Drink 80 ounces water daily.  Thanks, Georgian Co, PA-C

## 2022-11-21 NOTE — Telephone Encounter (Signed)
Pt was called and vm was left, Information has been sent to nurse pool.   Interpreter id # [phone]

## 2022-11-27 ENCOUNTER — Other Ambulatory Visit: Payer: Self-pay

## 2022-12-01 NOTE — Telephone Encounter (Signed)
Using Spanish interpreter Roman # [phone], given lab results and instructions. Verbalizes understanding. Also asking about financial aid she applied for and the status. Please advise pt.

## 2022-12-01 NOTE — Telephone Encounter (Signed)
Contacted Jenene Slicker, Patient Financial Counselor, [employer] to follow-up with patient regarding financial Aid

## 2023-02-23 ENCOUNTER — Ambulatory Visit: Payer: No Typology Code available for payment source | Admitting: Internal Medicine

## 2023-03-24 ENCOUNTER — Ambulatory Visit: Payer: Self-pay | Attending: Internal Medicine | Admitting: Internal Medicine

## 2023-03-24 ENCOUNTER — Encounter: Payer: Self-pay | Admitting: Internal Medicine

## 2023-03-24 VITALS — BP 119/81 | HR 85 | Temp 98.5°F | Ht <= 58 in | Wt 186.0 lb

## 2023-03-24 DIAGNOSIS — G245 Blepharospasm: Secondary | ICD-10-CM

## 2023-03-24 DIAGNOSIS — Z23 Encounter for immunization: Secondary | ICD-10-CM

## 2023-03-24 DIAGNOSIS — D500 Iron deficiency anemia secondary to blood loss (chronic): Secondary | ICD-10-CM

## 2023-03-24 NOTE — Progress Notes (Signed)
Patient ID: Kari Ortega, female    DOB: 08/19/1980  MRN: [id number]  CC: Follow-up (Follow-up. Nicki Reaper completing iron supplement prescribed/Involuntary twitching on L eye X1 mo/Flu vax administered on 03/24/23 - C.A.)   Subjective: Kari Ortega is a 42 y.o. female who presents for chronic ds management. 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 Pylori eradicated   [employer] Language interpreter used during this encounter. #[id number] Ortega  Father died 7 mths ago.  She was not able to go to funeral in [place].   Pt with hx of IDA.  No longer on iron supplement Still gets menses; menses heavy lasting 5 days.    C/o intermittent twitching LT eye x 3-4 wks. drinks coffee occasionally.  Getting adequate sleep.  Not on any new medications.  Obesity: Does some stretching exercises twice a week.  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 (Patient not taking: Reported on 09/24/2022) 30 tablet 5   ferrous sulfate 325 (65 FE) MG tablet Take 1 tablet by mouth every Monday, Wednesday, and Friday. (Patient not taking: Reported on 11/20/2022) 100 tablet 0   meloxicam (MOBIC) 15 MG tablet Take 1 tablet (15 mg total) by mouth daily. as needed for pain (Patient not taking: Reported on 03/24/2023) 30 tablet 1    methocarbamol (ROBAXIN) 500 MG tablet Take 2 tablets (1,000 mg total) by mouth every 8 (eight) hours as needed for muscle spasms. (Patient not taking: Reported on 03/24/2023) 90 tablet 0   Current Facility-Administered Medications on File Prior to Visit  Medication Dose Route Frequency Provider Last Rate Last Admin   polyethylene glycol powder (GLYCOLAX/MIRALAX) container 255 g  1 Container Oral Once Rasch, Harolyn Rutherford, NP        No Known Allergies  Social History   Socioeconomic History   Marital status: Married    Spouse name: Not on file   Number of children: 3   Years of education: Not on file   Highest education level: Not on file  Occupational History   Not on file  Tobacco Use   Smoking status: Never   Smokeless tobacco: Never  Vaping Use   Vaping status: Never Used  Substance and Sexual Activity   Alcohol use: No   Drug use: No   Sexual activity: Yes    Birth control/protection: Condom  Other Topics Concern   Not on file  Social History Narrative   Right handed   Social Determinants of Health   Financial Resource Strain: Not on file  Food Insecurity: No Food Insecurity (07/17/2022)   Hunger Vital Sign    Worried About Running Out of Food in the Last Year: Never true    Ran Out of Food in the Last Year: Never true  Transportation Needs: No Transportation Needs (07/17/2022)   PRAPARE - Transportation    Lack of Transportation (  Medical): No    Lack of Transportation (Non-Medical): No  Physical Activity: Not on file  Stress: Not on file  Social Connections: Not on file  Intimate Partner Violence: Not on file    Family History  Problem Relation Age of Onset   Diabetes Mother    Breast cancer Neg Hx     Past Surgical History:  Procedure Laterality Date   CHOLECYSTECTOMY N/A 06/14/2018   Procedure: LAPAROSCOPIC CHOLECYSTECTOMY;  Surgeon: Rodman Pickle, MD;  Location: [facility] OR;  Service: General;  Laterality: N/A;   TOOTH EXTRACTION      ROS: Review of  Systems Negative except as stated above  PHYSICAL EXAM: BP 119/81   Pulse 85   Temp 98.5 F (36.9 C) (Oral)   Ht 4\' 7"  (1.397 m)   Wt 186 lb (84.4 kg)   SpO2 98%   BMI 43.23 kg/m   Physical Exam  General appearance - alert, well appearing, and in no distress Mental status - normal mood, behavior, speech, dress, motor activity, and thought processes Eyes: Extraocular movement intact Chest - clear to auscultation, no wheezes, rales or rhonchi, symmetric air entry Heart - normal rate, regular rhythm, normal S1, S2, no murmurs, rubs, clicks or gallops     03/24/2023    5:13 PM 11/20/2022   10:58 AM 08/22/2022    3:43 PM  Depression screen PHQ 2/9  Decreased Interest 0 0 1  Down, Depressed, Hopeless 0 0 0  PHQ - 2 Score 0 0 1  Altered sleeping 1  1  Tired, decreased energy 0  1  Change in appetite 0  0  Feeling bad or failure about yourself  0  0  Trouble concentrating 0  1  Moving slowly or fidgety/restless 0  1  Suicidal thoughts 0  0  PHQ-9 Score 1  5  Difficult doing work/chores Not difficult at all         Latest Ref Rng & Units 10/02/2022    8:16 AM 07/11/2021    6:44 PM 07/05/2021   11:05 AM  CMP  Glucose 70 - 99 mg/dL 657  846  95   BUN 6 - 23 mg/dL 15  18  14    Creatinine 0.40 - 1.20 mg/dL 9.62  9.52  8.41   Sodium 135 - 145 mEq/L 136  141  139   Potassium 3.5 - 5.1 mEq/L 4.1  3.5  4.0   Chloride 96 - 112 mEq/L 105  104  103   CO2 19 - 32 mEq/L 25   24   Calcium 8.4 - 10.5 mg/dL 9.1   8.7   Total Protein 6.0 - 8.5 g/dL   6.8   Total Bilirubin 0.0 - 1.2 mg/dL   0.2   Alkaline Phos 44 - 121 IU/L   113   AST 0 - 40 IU/L   30   ALT 0 - 32 IU/L   34    Lipid Panel     Component Value Date/Time   CHOL 160 07/05/2021 1105   TRIG 239 (H) 07/05/2021 1105   HDL 28 (L) 07/05/2021 1105   CHOLHDL 5.7 (H) 07/05/2021 1105   LDLCALC 91 07/05/2021 1105    CBC    Component Value Date/Time   WBC 7.9 08/22/2022 1647   WBC 8.0 06/14/2018 0239   RBC 4.50 08/22/2022  1647   RBC 4.19 06/14/2018 0239   HGB 11.5 08/22/2022 1647   HCT 36.3 08/22/2022 1647   PLT 330 08/22/2022 1647  MCV 81 08/22/2022 1647   MCH 25.6 (L) 08/22/2022 1647   MCH 27.0 06/14/2018 0239   MCHC 31.7 08/22/2022 1647   MCHC 32.1 06/14/2018 0239   RDW 14.4 08/22/2022 1647   LYMPHSABS 2.8 12/17/2021 1537   MONOABS 0.5 07/01/2016 0948   EOSABS 0.2 12/17/2021 1537   BASOSABS 0.0 12/17/2021 1537    ASSESSMENT AND PLAN: 1. Iron deficiency anemia due to chronic blood loss - CBC; Future - Iron, TIBC and Ferritin Panel; Future  2. Morbid obesity (HCC) Patient advised to eliminate sugary drinks from the diet, cut back on portion sizes especially of white carbohydrates, eat more white lean meat like chicken [place] and seafood instead of beef or pork and incorporate fresh fruits and vegetables into the diet daily. Encouraged her to get in some form of moderate intensity exercise at least 3 days a week for 30 minutes - Comprehensive metabolic panel; Future - Lipid panel; Future  3. Blepharospasm of left eye Observe for now.  Let me know if it occurs more often  4. Encounter for immunization - Flu vaccine trivalent PF, 6mos and older(Flulaval,Afluria,Fluarix,Fluzone)    Patient was given the opportunity to ask questions.  Patient verbalized understanding of the plan and was able to repeat key elements of the plan.   This documentation was completed using Paediatric nurse.  Any transcriptional errors are unintentional.  Orders Placed This Encounter  Procedures   Flu vaccine trivalent PF, 6mos and older(Flulaval,Afluria,Fluarix,Fluzone)   CBC   Iron, TIBC and Ferritin Panel   Comprehensive metabolic panel   Lipid panel     Requested Prescriptions    No prescriptions requested or ordered in this encounter    Return in about 6 months (around 09/21/2023).  Jonah Blue, MD, FACP

## 2023-03-24 NOTE — Patient Instructions (Signed)
Alimentacin saludable en los adultos Healthy Eating, Adult Una alimentacin saludable puede ayudarlo a alcanzar y mantener un peso saludable, reducir el riesgo de tener enfermedades crnicas y vivir una vida larga y productiva. Es importante que siga una modalidad de alimentacin saludable. Sus necesidades nutricionales y calricas deben satisfacerse principalmente con distintos alimentos ricos en nutrientes. Consejos para seguir este plan Lea las etiquetas de los alimentos Lea las etiquetas y elija las que digan lo siguiente: Productos reducidos en sodio o con bajo contenido de sodio. Jugos con 100 % jugo de fruta. Alimentos con bajo contenido de grasas saturadas (menos de 3 g por porcin) y alto contenido de grasas poliinsaturadas y monoinsaturadas. Alimentos con cereales integrales, como trigo integral, trigo partido, arroz integral y arroz salvaje. Cereales integrales fortificados con cido flico. Esto se recomienda a las mujeres embarazadas o que desean quedar embarazadas. Lea las etiquetas y no coma ni beba lo siguiente: Alimentos o bebidas con azcar agregada. Estos incluyen los alimentos que contienen azcar moreno, endulzante a base de maz, jarabe de maz, dextrosa, fructosa, glucosa, jarabe de maz de alta fructosa, miel, azcar invertido, lactosa, jarabe de malta, maltosa, melaza, azcar sin refinar, sacarosa, trehalosa y azcar turbinado. Limite el consumo de azcar agregada a menos del 10 % del total de caloras diarias. No consuma ms que las siguientes cantidades de azcar agregada por da: 6 cucharaditas (25 g) para las mujeres. 9 cucharaditas (38 g) para los hombres. Los alimentos que contienen almidones y cereales refinados o procesados. Los productos de cereales refinados, como harina blanca, harina de maz desgerminada, pan blanco y arroz blanco. Al ir de compras Elija refrigerios ricos en nutrientes, como verduras, frutas enteras y frutos secos. Evite los refrigerios con  alto contenido de caloras y azcar, como las papas fritas, los refrigerios frutales y los caramelos. Use alios y productos para untar a base de aceite con los alimentos en lugar de grasas slidas como la mantequilla, la margarina, la crema agria o el queso crema. Limite las salsas, las mezclas y los productos "instantneos" preelaborados como el arroz saborizado, los fideos instantneos y las pastas listas para comer. Pruebe ms fuentes de protena vegetal, como tofu, tempeh, frijoles negros, edamame, lentejas, frutos secos y semillas. Explore planes de alimentacin como la dieta mediterrnea o la dieta vegetariana. Pruebe salsas cardiosaludables hechas con frijoles y grasas saludables, como hummus y guacamole. Las verduras van muy bien con ellas. Al cocinar Use aceite para saltear los alimentos en lugar de grasas slidas como mantequilla, margarina o manteca de cerdo. En lugar de frer, trate de cocinar en el horno, en la plancha o en la parrilla, o hervir los alimentos. Retire la parte grasa de las carnes antes de cocinarlas. Cocine las verduras al vapor en agua o caldo. Planificacin de las comidas  En las comidas, imagine dividir su plato en cuartos: La mitad del plato tiene frutas y verduras. Un cuarto del plato tiene cereales integrales. Un cuarto del plato tiene protena, especialmente carnes magras, aves, huevos, tofu, frijoles o frutos secos. Incluya lcteos descremados en su dieta diaria. Estilo de vida Elija opciones saludables en todos los mbitos, como en el hogar, el trabajo, la escuela, los restaurantes y las tiendas. Prepare los alimentos de un modo seguro: Lvese las manos despus de manipular carnes crudas. Donde prepare alimentos, mantenga las superficies limpias lavndolas regularmente con agua caliente y jabn. Mantenga las carnes crudas separadas de los alimentos que estn listos para comer como las frutas y las verduras. Cocine los frutos   de mar, carnes, aves y huevos  hasta alcanzar la temperatura recomendada. Consiga un termmetro para alimentos. Almacene los alimentos a temperaturas seguras. En general: Mantenga los alimentos fros a una temperatura de 40 F (4,4 C) o inferior. Mantenga los alimentos calientes a una temperatura de 140 F (60 C) o superior. Mantenga el congelador a una temperatura de 0 F (-17,8 C) o inferior. Los alimentos no son seguros para su consumo cuando han estado a una temperatura de entre 40 y 140 F (4.4 y 60 C) por ms de 2 horas. Qu alimentos debo comer? Frutas Propngase comer entre 1 y 2 tazas de frutas frescas, enlatadas (en su jugo natural) o congeladas cada da. Una taza de fruta equivale a 1 manzana pequea, 1 banana grande, 8 fresas grandes, 1 taza (237 g) de fruta enlatada,  taza (82 g) de fruta seca o 1 taza (240 ml) de jugo al 100 %. Verduras Propngase comer de 2 a 4 tazas de verduras frescas y congeladas cada da, incluyendo diferentes variedades y colores. Una taza de verduras equivale a 1 taza (91 g) de brcoli o coliflor, 2 zanahorias medianas, 2 tazas (150 g) de verduras de hoja verde crudas, 1 tomate grande, 1 pimiento morrn grande, 1 batata grande o 1 patata blanca mediana. Cereales Propngase comer el equivalente a entre 4 y 10 onzas de cereales integrales por da. Algunos ejemplos de equivalentes a 1 onza de cereales son 1 rebanada de pan, 1 taza (40 g) de cereal listo para comer, 3 tazas (24 g) de palomitas de maz o  taza (93 g) de arroz cocido. Carnes y otras protenas Propngase comer el equivalente a entre 5 y 7  onzas de protena por da. Algunos ejemplos de equivalentes a 1 onza de protenas incluyen 1 huevo,  oz de frutos secos (12 almendras, 24 pistachos o 7 mitades de nueces), 1/4 taza (90 g) de frijoles cocidos, 6 cucharadas (90 g) de hummus o 1 cucharada (16 g) de mantequilla de man. Un corte de carne o pescado del tamao de un mazo de cartas equivale aproximadamente a 3 a 4 onzas (85  g). De las protenas que consume cada semana, intente que al menos 8 onzas (227 g) sean frutos de mar. Esto equivale a unas 2 porciones por semana. Esto incluye salmn, trucha, arenque y anchoas. Lcteos Propngase comer el equivalente a 3 tazas de lcteos descremados o con bajo contenido de grasa cada da. Algunos ejemplos de equivalentes a 1 taza de lcteos son 1 taza (240 ml) de leche, 8 onzas (250 g) de yogur, 1 onzas (44 g) de queso natural o 1 taza (240 ml) de leche de soja fortificada. Grasas y aceites Propngase consumir alrededor de 5 cucharaditas (21 g) de grasas y aceites por da. Elija grasas monoinsaturadas, como el aceite de canola y de oliva, la mayonesa hecha con aceite de oliva o de aguacate, aguacates, mantequilla de man y la mayora de los frutos secos, o bien grasas poliinsaturadas, como el aceite de girasol, maz y soja, nueces, piones, semillas de ssamo, semillas de girasol y semillas de lino. Bebidas Propngase beber 6 vasos de 8 onzas de agua por da. Limite el caf a entre 3 y 5 tazas de ocho onzas por da. Limite el consumo de bebidas con cafena que tengan caloras agregadas, como los refrescos y las bebidas energizantes. Si bebe alcohol: Limite la cantidad que bebe a lo siguiente: De 0 a 1 medida al da si es mujer. De 0 a 2 medidas   al da si es varn. Sepa cunta cantidad de alcohol hay en las bebidas que toma. En los [place], una medida es una botella de cerveza de 12 oz (355 ml), un vaso de vino de 5 oz (148 ml) o un vaso de una bebida alcohlica de alta graduacin de 1 oz (44 ml). Condimentos y otros alimentos Trate de no agregar demasiada sal a los alimentos. Trate de usar hierbas y especias en lugar de sal. Trate de no agregar azcar a los alimentos. Esta informacin se basa en las pautas de nutricin de los [place] Para obtener ms informacin, visite choosemyplate.gov. Las cantidades exactas pueden variar. Es posible que necesite cantidades  diferentes. Esta informacin no tiene como fin reemplazar el consejo del mdico. Asegrese de hacerle al mdico cualquier pregunta que tenga. Document Revised: 02/25/2022 Document Reviewed: 02/25/2022 Elsevier Patient Education  2024 Elsevier Inc.  

## 2023-03-26 ENCOUNTER — Ambulatory Visit: Payer: Self-pay | Attending: Internal Medicine

## 2023-03-26 DIAGNOSIS — D500 Iron deficiency anemia secondary to blood loss (chronic): Secondary | ICD-10-CM

## 2023-03-27 LAB — LIPID PANEL
Chol/HDL Ratio: 6.6 ratio — ABNORMAL HIGH (ref 0.0–4.4)
Cholesterol, Total: 185 mg/dL (ref 100–199)
HDL: 28 mg/dL — ABNORMAL LOW (ref >39)
LDL Chol Calc (NIH): 114 mg/dL — ABNORMAL HIGH (ref 0–99)
Triglycerides: 246 mg/dL — ABNORMAL HIGH (ref 0–149)
VLDL Cholesterol Cal: 43 mg/dL — ABNORMAL HIGH (ref 5–40)

## 2023-03-27 LAB — COMPREHENSIVE METABOLIC PANEL
ALT: 51 [IU]/L — ABNORMAL HIGH (ref 0–32)
AST: 43 [IU]/L — ABNORMAL HIGH (ref 0–40)
Albumin: 4.1 g/dL (ref 3.9–4.9)
Alkaline Phosphatase: 104 [IU]/L (ref 44–121)
BUN/Creatinine Ratio: 15 (ref 9–23)
BUN: 10 mg/dL (ref 6–24)
Bilirubin Total: 0.3 mg/dL (ref 0.0–1.2)
CO2: 22 mmol/L (ref 20–29)
Calcium: 9 mg/dL (ref 8.7–10.2)
Chloride: 104 mmol/L (ref 96–106)
Creatinine, Ser: 0.65 mg/dL (ref 0.57–1.00)
Globulin, Total: 3.3 g/dL (ref 1.5–4.5)
Glucose: 98 mg/dL (ref 70–99)
Potassium: 4.4 mmol/L (ref 3.5–5.2)
Sodium: 142 mmol/L (ref 134–144)
Total Protein: 7.4 g/dL (ref 6.0–8.5)
eGFR: 113 mL/min/{1.73_m2} (ref >59)

## 2023-03-27 LAB — CBC
Hematocrit: 35.3 % (ref 34.0–46.6)
Hemoglobin: 10.8 g/dL — ABNORMAL LOW (ref 11.1–15.9)
MCH: 24 pg — ABNORMAL LOW (ref 26.6–33.0)
MCHC: 30.6 g/dL — ABNORMAL LOW (ref 31.5–35.7)
MCV: 78 fL — ABNORMAL LOW (ref 79–97)
Platelets: 363 10*3/uL (ref 150–450)
RBC: 4.5 x10E6/uL (ref 3.77–5.28)
RDW: 15.8 % — ABNORMAL HIGH (ref 11.7–15.4)
WBC: 9.7 10*3/uL (ref 3.4–10.8)

## 2023-03-27 LAB — IRON,TIBC AND FERRITIN PANEL
Ferritin: 18 ng/mL (ref 15–150)
Iron Saturation: 5 % — CL (ref 15–55)
Iron: 20 ug/dL — ABNORMAL LOW (ref 27–159)
Total Iron Binding Capacity: 388 ug/dL (ref 250–450)
UIBC: 368 ug/dL (ref 131–425)

## 2023-03-28 ENCOUNTER — Other Ambulatory Visit: Payer: Self-pay | Admitting: Internal Medicine

## 2023-03-28 DIAGNOSIS — R7989 Other specified abnormal findings of blood chemistry: Secondary | ICD-10-CM

## 2023-03-28 MED ORDER — FERROUS SULFATE 325 (65 FE) MG PO TABS
325.0000 mg | ORAL_TABLET | Freq: Every day | ORAL | 1 refills | Status: DC
  Filled 2023-03-28: qty 30, 30d supply, fill #0

## 2023-03-28 NOTE — Progress Notes (Signed)
Let patient know that she is still anemic due to iron deficiency.  I recommend restarting iron supplement and taking 1 tablet daily.  I have sent the prescription to the pharmacy for iron supplement called ferrous sulfate. -Cholesterol levels are elevated.  High cholesterol increases risk for heart attack and strokes.  Healthy eating habits and regular exercise will help to lower cholesterol. -She has mild elevation in her liver function tests.  Please return to the lab at her earliest convenience for additional blood test to evaluate this further. Orders placed.   The rest of this is for my information The 10-year ASCVD risk score (Arnett DK, et al., 2019) is: 1.6%   Values used to calculate the score:     Age: 79 years     Sex: Female     Is Non-Hispanic African American: No     Diabetic: No     Tobacco smoker: No     Systolic Blood Pressure: 119 mmHg     Is BP treated: No     HDL Cholesterol: 28 mg/dL     Total Cholesterol: 185 mg/dL

## 2023-03-30 ENCOUNTER — Other Ambulatory Visit: Payer: Self-pay

## 2023-03-31 ENCOUNTER — Other Ambulatory Visit: Payer: Self-pay

## 2023-03-31 ENCOUNTER — Ambulatory Visit: Payer: Self-pay | Attending: Family Medicine

## 2023-03-31 DIAGNOSIS — R7989 Other specified abnormal findings of blood chemistry: Secondary | ICD-10-CM

## 2023-04-01 LAB — HEPATITIS B SURFACE ANTIBODY, QUANTITATIVE: Hepatitis B Surf Ab Quant: <3.5 m[IU]/mL — ABNORMAL LOW

## 2023-04-01 LAB — HEPATITIS C ANTIBODY: Hep C Virus Ab: NONREACTIVE

## 2023-04-01 LAB — HEPATITIS B SURFACE ANTIGEN: Hepatitis B Surface Ag: NEGATIVE

## 2023-04-01 NOTE — Progress Notes (Signed)
Screen for infections that affect the liver namely hepatitis B and C was negative.  She is not adequately immunized against hepatitis B.  If she would like to receive the vaccine series to protect against getting hepatitis B, we can set up an appointment with the RN for her to get the first out of 3 shots.

## 2023-04-03 ENCOUNTER — Ambulatory Visit: Payer: Self-pay

## 2023-04-10 ENCOUNTER — Ambulatory Visit: Payer: Self-pay

## 2023-04-17 ENCOUNTER — Ambulatory Visit: Payer: Self-pay | Attending: Internal Medicine

## 2023-04-17 DIAGNOSIS — Z23 Encounter for immunization: Secondary | ICD-10-CM | POA: Insufficient documentation

## 2023-04-17 NOTE — Progress Notes (Signed)
Hepatitis B recombinant vaccine administered per protocols.  Information sheet given. Patient denies and pain or discomfort at injection site. Tolerated injection well no reaction.

## 2023-04-28 ENCOUNTER — Ambulatory Visit: Payer: No Typology Code available for payment source | Admitting: Internal Medicine

## 2023-09-22 ENCOUNTER — Ambulatory Visit: Payer: Self-pay | Attending: Internal Medicine | Admitting: Internal Medicine

## 2023-09-22 ENCOUNTER — Encounter: Payer: Self-pay | Admitting: Internal Medicine

## 2023-09-22 VITALS — BP 129/87 | HR 91 | Temp 98.2°F | Resp 16 | Ht <= 58 in | Wt 190.0 lb

## 2023-09-22 DIAGNOSIS — R1032 Left lower quadrant pain: Secondary | ICD-10-CM

## 2023-09-22 DIAGNOSIS — E782 Mixed hyperlipidemia: Secondary | ICD-10-CM

## 2023-09-22 DIAGNOSIS — D500 Iron deficiency anemia secondary to blood loss (chronic): Secondary | ICD-10-CM

## 2023-09-22 DIAGNOSIS — G5601 Carpal tunnel syndrome, right upper limb: Secondary | ICD-10-CM

## 2023-09-22 NOTE — Progress Notes (Signed)
 Patient ID: Kari Ortega, female    DOB: 09-22-1980  MRN: [id number]  CC: Anemia   Subjective: Kari Ortega is a 43 y.o. female who presents for chronic ds management. Her concerns today include:  Pt with hx obesity, HL, pre DM, left adrenal incidentaloma with positive low-dose dexamethasone  suppression test (seen by endo subsequent test negative) positive H. Pylori eradicated   [employer] Language interpreter used during this encounter. #[id number], Liliana  IDA: Was anemic and iron studies still c/w IDA on last visit.  Took the supplement for 1.5 mths Reports last 3 menses have been light spotting last 7 days.   Feels a bulging in LT lower abdomen area when she coughs x 8 mths.  Reports being told by our physician assistant in July of last year that she may have a hernia.  She denies any abdominal pain when she eats.  No pain in this area when she is having a bowel movement.  No blood in the stools.  RT hand sometimes feels numb since yesterday. Goes on comes.  Some soreness of the wrist associated with it.  She is right-handed and does a lot of housework/cleaning during the day.  Cholesterol level was elevated when last checked.  Trying to do better with eating habits.  Would like to have her levels rechecked.  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   Current Facility-Administered Medications on File Prior to Visit  Medication Dose Route Frequency Provider Last Rate Last Admin   polyethylene glycol powder (GLYCOLAX /MIRALAX ) container 255 g  1 Container Oral Once  Rasch, Juliette Oh, NP        No Known Allergies  Social History   Socioeconomic History   Marital status: Married    Spouse name: Not on file   Number of children: 3   Years of education: Not on file   Highest education level: Not on file  Occupational History   Not on file  Tobacco Use   Smoking status: Never   Smokeless tobacco: Never  Vaping Use   Vaping status: Never Used  Substance and Sexual Activity   Alcohol use: No   Drug use: No   Sexual activity: Yes    Birth control/protection: Condom  Other Topics Concern   Not on file  Social History Narrative   Right handed   Social Drivers of Health   Financial Resource Strain: Medium Risk (03/24/2023)   Overall Financial Resource Strain (CARDIA)    Difficulty of Paying Living Expenses: Somewhat hard  Food Insecurity: No Food Insecurity (03/24/2023)   Hunger Vital Sign    Worried About Running Out of Food in the Last Year: Never true    Ran Out of Food in the Last Year: Never true  Transportation Needs: No Transportation Needs (03/24/2023)   PRAPARE - Administrator, Civil Service (Medical): No    Lack of Transportation (Non-Medical): No  Physical Activity: Inactive (03/24/2023)   Exercise Vital Sign    Days of Exercise per Week: 0 days    Minutes of Exercise per Session: 0 min  Stress: No Stress Concern Present (03/24/2023)   [employer] of Occupational Health - Occupational Stress Questionnaire    Feeling of Stress : Not at all  Social Connections: Socially Integrated (03/24/2023)   Social Connection and Isolation Panel [NHANES]    Frequency of Communication with Friends and Family: Three times a week    Frequency of Social Gatherings with Friends and Family: Twice a week    Attends Religious Services: More than 4 times per year    Active Member of [employer] or Organizations: Yes    Attends Banker Meetings: More than 4 times per year    Marital Status: Married  Catering manager  Violence: Not At Risk (03/24/2023)   Humiliation, Afraid, Rape, and Kick questionnaire    Fear of Current or Ex-Partner: No    Emotionally Abused: No    Physically Abused: No    Sexually Abused: No    Family History  Problem Relation Age of Onset   Diabetes Mother    Breast cancer Neg Hx     Past Surgical History:  Procedure Laterality Date   CHOLECYSTECTOMY N/A 06/14/2018   Procedure: LAPAROSCOPIC CHOLECYSTECTOMY;  Surgeon: [name], Alphonso Aschoff, MD;  Location: [facility] OR;  Service: General;  Laterality: N/A;   TOOTH EXTRACTION      ROS: Review of Systems Negative except as stated above  PHYSICAL EXAM: BP 129/87 (BP Location: Left Arm, Patient Position: Sitting, Cuff Size: Normal)   Pulse 91   Temp 98.2 F (36.8 C) (Oral)   Resp 16   Ht 4\' 7"  (1.397 m)   Wt 190 lb (86.2 kg)   LMP 09/18/2023 (Approximate)   SpO2 96%   BMI 44.16 kg/m   Physical Exam  General appearance - alert, well appearing, obese middle-age Hispanic female and in no distress Mental status - normal mood, behavior, speech, dress, motor activity, and thought processes Eyes -slightly pale conjunctiva. Chest - clear to auscultation, no wheezes, rales or rhonchi, symmetric air entry Heart - normal rate, regular rhythm, normal S1, S2, no murmurs, rubs, clicks or gallops Abdomen -abdomen is obese.  Normal bowel sounds.  Soft.  Nontender.  No bulging noted in the left lower quadrant with coughing and having the patient bear down. Musculoskeletal -grip 5/5 bilaterally.  Tinel's sign is positive on the right.      Latest Ref Rng & Units 03/26/2023   10:29 AM 10/02/2022    8:16 AM 07/11/2021    6:44 PM  CMP  Glucose 70 - 99 mg/dL 98  161  096   BUN 6 - 24 mg/dL 10  15  18    Creatinine 0.57 - 1.00 mg/dL 0.45  4.09  8.11   Sodium 134 - 144 mmol/L 142  136  141   Potassium 3.5 - 5.2 mmol/L 4.4  4.1  3.5   Chloride 96 - 106 mmol/L 104  105  104   CO2 20 - 29 mmol/L 22  25    Calcium 8.7 - 10.2 mg/dL 9.0  9.1     Total Protein 6.0 - 8.5 g/dL 7.4     Total Bilirubin 0.0 - 1.2 mg/dL 0.3     Alkaline Phos 44 - 121 IU/L 104     AST 0 - 40 IU/L 43     ALT 0 - 32 IU/L 51      Lipid Panel     Component Value Date/Time   CHOL 185 03/26/2023 1029   TRIG 246 (H) 03/26/2023 1029   HDL  28 (L) 03/26/2023 1029   CHOLHDL 6.6 (H) 03/26/2023 1029   LDLCALC 114 (H) 03/26/2023 1029    CBC    Component Value Date/Time   WBC 9.7 03/26/2023 1029   WBC 8.0 06/14/2018 0239   RBC 4.50 03/26/2023 1029   RBC 4.19 06/14/2018 0239   HGB 10.8 (L) 03/26/2023 1029   HCT 35.3 03/26/2023 1029   PLT 363 03/26/2023 1029   MCV 78 (L) 03/26/2023 1029   MCH 24.0 (L) 03/26/2023 1029   MCH 27.0 06/14/2018 0239   MCHC 30.6 (L) 03/26/2023 1029   MCHC 32.1 06/14/2018 0239   RDW 15.8 (H) 03/26/2023 1029   LYMPHSABS 2.8 12/17/2021 1537   MONOABS 0.5 07/01/2016 0948   EOSABS 0.2 12/17/2021 1537   BASOSABS 0.0 12/17/2021 1537    ASSESSMENT AND PLAN: 1. Iron deficiency anemia due to chronic blood loss (Primary) We will recheck CBC and iron studies today. - CBC - Iron, TIBC and Ferritin Panel  2. Carpal tunnel syndrome of right wrist Most likely carpal tunnel syndrome.  Discussed diagnosis with patient.  We are out of cock up wrist splints.  However I wrote down the name of this for her and told her that she can check with the medical supply store if she wishes to purchase 1  3. Left lower quadrant abdominal pain I do not appreciate a hernia at this time.  Advised patient that if she noticed pain in this area when lifting, having a bowel movement or when she eats, please come back for us  to reassess and do imaging study.  4. Mixed hyperlipidemia - Lipid panel       Patient was given the opportunity to ask questions.  Patient verbalized understanding of the plan and was able to repeat key elements of the plan.   This documentation was completed using Paediatric nurse.  Any transcriptional errors  are unintentional.  No orders of the defined types were placed in this encounter.    Requested Prescriptions    No prescriptions requested or ordered in this encounter    No follow-ups on file.  Concetta Dee, MD, FACP

## 2023-09-22 NOTE — Progress Notes (Signed)
 F/u anemia, blepharism left eye LMP- spotting Look at skin near pelvic area- something is poking out  [id number]

## 2023-09-23 ENCOUNTER — Other Ambulatory Visit: Payer: Self-pay | Admitting: Internal Medicine

## 2023-09-23 ENCOUNTER — Ambulatory Visit: Payer: Self-pay | Admitting: Internal Medicine

## 2023-09-23 ENCOUNTER — Other Ambulatory Visit: Payer: Self-pay

## 2023-09-23 DIAGNOSIS — D500 Iron deficiency anemia secondary to blood loss (chronic): Secondary | ICD-10-CM

## 2023-09-23 LAB — CBC
Hematocrit: 40.1 % (ref 34.0–46.6)
Hemoglobin: 12.3 g/dL (ref 11.1–15.9)
MCH: 25.4 pg — ABNORMAL LOW (ref 26.6–33.0)
MCHC: 30.7 g/dL — ABNORMAL LOW (ref 31.5–35.7)
MCV: 83 fL (ref 79–97)
Platelets: 315 10*3/uL (ref 150–450)
RBC: 4.85 x10E6/uL (ref 3.77–5.28)
RDW: 15.8 % — ABNORMAL HIGH (ref 11.7–15.4)
WBC: 10.1 10*3/uL (ref 3.4–10.8)

## 2023-09-23 LAB — IRON,TIBC AND FERRITIN PANEL
Ferritin: 39 ng/mL (ref 15–150)
Iron Saturation: 9 % — CL (ref 15–55)
Iron: 30 ug/dL (ref 27–159)
Total Iron Binding Capacity: 330 ug/dL (ref 250–450)
UIBC: 300 ug/dL (ref 131–425)

## 2023-09-23 LAB — LIPID PANEL
Chol/HDL Ratio: 6.3 ratio — ABNORMAL HIGH (ref 0.0–4.4)
Cholesterol, Total: 163 mg/dL (ref 100–199)
HDL: 26 mg/dL — ABNORMAL LOW (ref >39)
LDL Chol Calc (NIH): 82 mg/dL (ref 0–99)
Triglycerides: 334 mg/dL — ABNORMAL HIGH (ref 0–149)
VLDL Cholesterol Cal: 55 mg/dL — ABNORMAL HIGH (ref 5–40)

## 2023-09-23 MED ORDER — FERROUS SULFATE 325 (65 FE) MG PO TABS
325.0000 mg | ORAL_TABLET | Freq: Every day | ORAL | 1 refills | Status: DC
  Filled 2023-09-23: qty 100, 100d supply, fill #0

## 2023-09-24 ENCOUNTER — Other Ambulatory Visit: Payer: Self-pay

## 2023-09-24 DIAGNOSIS — N6320 Unspecified lump in the left breast, unspecified quadrant: Secondary | ICD-10-CM

## 2023-09-28 ENCOUNTER — Ambulatory Visit: Payer: Self-pay | Admitting: Internal Medicine

## 2023-09-28 NOTE — Progress Notes (Deleted)
 Name: Kari Ortega  MRN/ DOB: [id number], 03/24/1981    Age/ Sex: 42 y.o., female    PCP: Lawrance Presume, MD   Reason for Endocrinology Evaluation: Adrenal adenoma     Date of Initial Endocrinology Evaluation: 03/26/2022    HPI: Ms. Kari Ortega is a 43 y.o. female with a past medical history of dyslipidemia and degenerative joint disease. The patient presented for initial endocrinology clinic visit on 03/26/2022 for consultative assistance with her adrenal adenoma.    During evaluation for abdominal pain in February 2023 she was noted to have a left 1.3 cm fat-containing adrenal adenoma on CT imaging of the abdomen.   In reviewing her records she has been noted with normal metanephrines, cortisol, renin, Aldo, and Aldo/renin ratio 07/2021 Dexamethasone  suppression test was normal at 0.2 UG/DL 27/2536     SUBJECTIVE:    Today (09/28/23):  Kari Ortega is here for follow-up on adrenal adenoma.  Interpreter line was used today    Substantial weight gain- weight has been fluctuating  Severe hypertension- no  DM- no Sudden/ severe headaches- has occasional right sided headaches  Anxiety attacks- rare  Cardiac arrhythmias- no Palpitations- no except when she lost her father ~ 6 weeks  Fluid retention- no    She has been exercising 20 minutes a day  She continues with occasional snacking  She drinks occasional sugar-sweetened beverages     HISTORY:  Past Medical History:  Past Medical History:  Diagnosis Date   Anemia    Facial paralysis    c/o facial paralysis with last deliveries.  Unable to determine reason per pt.     Irregular periods    Past Surgical History:  Past Surgical History:  Procedure Laterality Date   CHOLECYSTECTOMY N/A 06/14/2018   Procedure: LAPAROSCOPIC CHOLECYSTECTOMY;  Surgeon: [name], Alphonso Aschoff, MD;  Location: [facility] OR;  Service: General;  Laterality: N/A;   TOOTH EXTRACTION       Social History:  reports that she has never smoked. She has never used smokeless tobacco. She reports that she does not drink alcohol and does not use drugs. Family History: family history includes Diabetes in her mother.   HOME MEDICATIONS: Allergies as of 09/28/2023   No Known Allergies      Medication List        Accurate as of Sep 28, 2023  1:00 PM. If you have any questions, ask your nurse or doctor.          FeroSul 325 (65 Fe) MG tablet Generic drug: ferrous sulfate  Tome 1 tableta (325 mg en total) por va oral diariamente con el desayuno. (Take 1 tablet (325 mg total) by mouth daily with breakfast.)          REVIEW OF SYSTEMS: A comprehensive ROS was conducted with the patient and is negative except as per HPI   OBJECTIVE:  VS: LMP 09/18/2023 (Approximate)    Wt Readings from Last 3 Encounters:  09/22/23 190 lb (86.2 kg)  03/24/23 186 lb (84.4 kg)  11/20/22 183 lb (83 kg)     EXAM: General: Pt appears well and is in NAD  Neck: General: Supple without adenopathy. Thyroid: Thyroid size normal.  No goiter or nodules appreciated.  Lungs: Clear with good BS bilat   Heart: Auscultation: RRR.  Abdomen: soft, nontender  Extremities:  BL LE: No pretibial edema normal   Mental Status: Judgment, insight: Intact Orientation: Oriented to time, place, and person Mood and affect: No depression,  anxiety, or agitation     DATA REVIEWED:   Latest Reference Range & Units Most Recent  ACTH 7.2 - 63.3 pg/mL 17.6 08/15/21 15:39  ALDOSTERONE 0.0 - 30.0 ng/dL 60.4 5/40/98 11:91  Cortisol - AM 6.2 - 19.4 ug/dL 6.3 4/78/29 56:21  Renin 0.167 - 5.380 ng/mL/hr 2.734 08/08/21 08:56  ALDOS/RENIN RATIO 0.0 - 30.0  7.8 08/08/21 08:56  Metanephrine, Pl 0.0 - 88.0 pg/mL <10.0 08/08/21 08:56  Normetanephrine, Pl 0.0 - 210.1 pg/mL 38.6 08/08/21 08:56    CT Abdomen 06/21/2021  Narrative & Impression  CLINICAL DATA:  Abdominal pain, acute, nonlocalized. Left  lower quadrant abdominal pain and umbilical pain.    FINDINGS: Lower chest: Lung bases are clear except for a 3 mm calcified granuloma in the posterior left lower lobe.   Hepatobiliary: Previous cholecystectomy. Liver parenchyma is normal.   Pancreas: Normal   Spleen: Normal   Adrenals/Urinary Tract: Adrenal glands are normal except for a fat-density adenoma on the left measuring 13 mm. Contrast is present within the renal collecting systems, which could obscure small nonobstructing renal calculi. No hydronephrosis. No evidence of passing stone. No stone in the bladder.     Old records , labs and images have been reviewed.    ASSESSMENT/PLAN/RECOMMENDATIONS:   Left Adrenal Adenoma:   -She already had normal renin, Aldo, Aldo/renin ratio, metanephrines in March 2023, normal dexamethasone  suppression test 03/2022, normal aldo, renin, dexamethasone  suppression test 09/2022 -There is no clinical evidence of hypersecretion at this time -Patient will return for fasting 8 AM labs for dexamethasone  suppression test, Aldo, renin, and metanephrines    Signed electronically by: Natale Bail, MD  [facility] Endocrinology  [facility] [address] [place] [place], [place] [address] Phone: [phone] FAX: [phone]   CC: Lawrance Presume, MD [address] [place] [address] [place] [address] Phone: [phone] Fax: [phone]   Return to Endocrinology clinic as below: Future Appointments  Date Time Provider Department Center  09/28/2023  2:20 PM Yoneko Talerico, Julian Obey, MD [facility] None  10/07/2023 10:30 AM [facility] CLINIC [facility] None  10/13/2023  9:40 AM [facility] DIAG TOMO 1 [facility] CE  10/13/2023  9:50 AM [facility] US  1 [facility]US [facility]BRE[facility] CE  11/23/2023 11:30 AM [facility] LAB [facility] None  03/24/2024  3:50 PM Lawrance Presume, MD [facility] None

## 2023-10-07 ENCOUNTER — Ambulatory Visit: Payer: Self-pay | Admitting: *Deleted

## 2023-10-07 VITALS — BP 116/88 | Wt 190.0 lb

## 2023-10-07 DIAGNOSIS — N644 Mastodynia: Secondary | ICD-10-CM

## 2023-10-07 DIAGNOSIS — Z1239 Encounter for other screening for malignant neoplasm of breast: Secondary | ICD-10-CM

## 2023-10-07 NOTE — Progress Notes (Signed)
 Kari Ortega is a 43 y.o. female who presents to [facility] clinic today with complaint of left outer breast lump x 2 months that is painful when touched. Patient rates the pain at a 3 out of 10.    Pap Smear: Pap smear not completed today. Last Pap smear was 12/15/2019 at [facility] and was normal with negative HPV. Per patient has no history of an abnormal Pap smear. Last Pap smear result is available in Epic.   Physical exam: Breasts Breasts symmetrical. No skin abnormalities bilateral breasts. No nipple retraction bilateral breasts. No nipple discharge bilateral breasts. No lymphadenopathy. No lumps palpated bilateral breasts. Unable to palpate a lump in patients area of concern. Complaints of left outer breast pain on exam.      MS DIGITAL SCREENING TOMO BILATERAL Result Date: 07/21/2022 CLINICAL DATA:  Screening. EXAM: DIGITAL SCREENING BILATERAL MAMMOGRAM WITH TOMOSYNTHESIS AND CAD TECHNIQUE: Bilateral screening digital craniocaudal and mediolateral oblique mammograms were obtained. Bilateral screening digital breast tomosynthesis was performed. The images were evaluated with computer-aided detection. COMPARISON:  None available. ACR Breast Density Category c: The breasts are heterogeneously dense, which may obscure small masses. FINDINGS: There are no findings suspicious for malignancy. IMPRESSION: No mammographic evidence of malignancy. A result letter of this screening mammogram will be mailed directly to the patient. RECOMMENDATION: Screening mammogram in one year. (Code:SM-B-01Y) BI-RADS CATEGORY  1: Negative. Electronically Signed   By: Serena  Chacko M.D.   On: 07/21/2022 15:10   Pelvic/Bimanual Pap is not indicated today per BCCCP guidelines.   Smoking History: Patient has never smoked.   Patient Navigation: Patient education provided. Access to services provided for patient through [facility] program. Spanish interpreter Kari Ortega  from [facility] provided.   Breast and Cervical Cancer Risk Assessment: Patient does not have family history of breast cancer, known genetic mutations, or radiation treatment to the chest before age 38. Patient does not have history of cervical dysplasia, immunocompromised, or DES exposure in-utero.  Risk Scores as of Encounter on 10/07/2023     Kari Ortega           5-year 0.31%   Lifetime 5.6%            Last calculated by Silas, Ansyi K, CMA on 10/07/2023 at 10:51 AM        A: BCCCP exam without pap smear Complaint of left breast lump and pain.  P: Referred patient to the [facility] for a diagnostic mammogram. Appointment scheduled Tuesday, October 13, 2023 at 0940.  Stefan Edge, RN 10/07/2023 11:00 AM

## 2023-10-07 NOTE — Patient Instructions (Signed)
 Explained breast self awareness with Dreama Madahi Perez-Mendez. Patient did not need a Pap smear today due to last Pap smear and HPV typing was 12/15/2019. Let her know BCCCP will cover Pap smears and HPV typing every 5 years unless has a history of abnormal Pap smears. Referred patient to the [facility] for a diagnostic mammogram. Appointment scheduled Tuesday, October 13, 2023 at 0940. Patient aware of appointment and will be there. Nema Madahi Perez-Mendez verbalized understanding.  Jasemine Nawaz, Dela Favor, RN 11:01 AM

## 2023-10-13 ENCOUNTER — Ambulatory Visit
Admission: RE | Admit: 2023-10-13 | Discharge: 2023-10-13 | Disposition: A | Discharge status: Home / Self Care | Payer: Self-pay | Source: Ambulatory Visit | Attending: Obstetrics and Gynecology | Admitting: Obstetrics and Gynecology

## 2023-10-13 ENCOUNTER — Other Ambulatory Visit: Payer: Self-pay | Admitting: Obstetrics and Gynecology

## 2023-10-13 DIAGNOSIS — N6320 Unspecified lump in the left breast, unspecified quadrant: Secondary | ICD-10-CM

## 2023-10-16 ENCOUNTER — Other Ambulatory Visit: Payer: Self-pay

## 2023-10-16 ENCOUNTER — Ambulatory Visit (INDEPENDENT_AMBULATORY_CARE_PROVIDER_SITE_OTHER): Payer: Self-pay | Admitting: Internal Medicine

## 2023-10-16 VITALS — BP 112/68 | HR 70 | Ht <= 58 in | Wt 190.8 lb

## 2023-10-16 DIAGNOSIS — D3502 Benign neoplasm of left adrenal gland: Secondary | ICD-10-CM

## 2023-10-16 MED ORDER — DEXAMETHASONE 1 MG PO TABS
1.0000 mg | ORAL_TABLET | Freq: Once | ORAL | 0 refills | Status: AC
  Filled 2023-10-16: qty 1, 1d supply, fill #0

## 2023-10-16 NOTE — Patient Instructions (Addendum)
 Apps: My fitness pal , calorie king   Instructions for Dexamethasone  Suppression Test   Step 1: Choose a morning when you can come to our lab at 8:00 am for a blood draw.   Step 2: On the night before the blood draw, take one 1 mg tablet of dexamethasone  at 11:30 pm.  The timing is VERY important!   Step 3: The next morning, go to the lab for blood work at 8:00 am.  Elene Griffes do not have to be on an empty stomach, but the timing is VERY important!    Instrucciones para la prueba de supresin con [employer]  Paso 1: Elija una maana en la que pueda venir a nuestro laboratorio a las 8:00 a. m. para una extraccin de [place].  Paso 2: La noche anterior a la extraccin de sangre, tome un comprimido de dexametasona de 1 mg a las 23:30 horas. El momento es MUY importante!  Paso 3: A la maana siguiente, vaya al laboratorio para hacerse un anlisis de sangre a las 8:00 a. m. No es necesario que ests con el estmago vaco, pero el momento es MUY importante!

## 2023-10-16 NOTE — Progress Notes (Signed)
 Name: Kari Ortega  MRN/ DOB: [id number], June 01, 1980    Age/ Sex: 43 y.o., female    PCP: Lawrance Presume, MD   Reason for Endocrinology Evaluation: Adrenal adenoma     Date of Initial Endocrinology Evaluation: 03/26/2022    HPI: Ms. Kari Ortega is a 43 y.o. female with a past medical history of dyslipidemia and degenerative joint disease. The patient presented for initial endocrinology clinic visit on 03/26/2022 for consultative assistance with her adrenal adenoma.    During evaluation for abdominal pain in February 2023 she was noted to have a left 1.3 cm fat-containing adrenal adenoma on CT imaging of the abdomen.   In reviewing her records she has been noted with normal metanephrines, cortisol, renin, Aldo, and Aldo/renin ratio 07/2021 Dexamethasone  suppression test was normal at 0.2 UG/DL 40/9811  Repeat metanephrines, Aldo, renin and dexamethasone  suppression test were all normal 09/2022   SUBJECTIVE:    Today (10/16/23):  Kari Ortega is here for follow-up on adrenal adenoma.  Interpreter line was used today Sylvina [id number]   Substantial weight gain- yes Severe hypertension- no DM- no Anxiety attacks- no Cardiac arrhythmias- no Palpitations-no  Fluid retention- no  Hypokalemia- no No changes in bowel movements     HISTORY:  Past Medical History:  Past Medical History:  Diagnosis Date   Anemia    Facial paralysis    c/o facial paralysis with last deliveries.  Unable to determine reason per pt.     Irregular periods    Past Surgical History:  Past Surgical History:  Procedure Laterality Date   CHOLECYSTECTOMY N/A 06/14/2018   Procedure: LAPAROSCOPIC CHOLECYSTECTOMY;  Surgeon: [name], Alphonso Aschoff, MD;  Location: [facility] OR;  Service: General;  Laterality: N/A;   TOOTH EXTRACTION      Social History:  reports that she has never smoked. She has never used smokeless tobacco. She reports that she does not drink  alcohol and does not use drugs. Family History: family history includes Diabetes in her mother.   HOME MEDICATIONS: Allergies as of 10/16/2023   No Known Allergies      Medication List        Accurate as of October 16, 2023 12:05 PM. If you have any questions, ask your nurse or doctor.          FeroSul 325 (65 Fe) MG tablet Generic drug: ferrous sulfate  Tome 1 tableta (325 mg en total) por va oral diariamente con el desayuno. (Take 1 tablet (325 mg total) by mouth daily with breakfast.)          REVIEW OF SYSTEMS: A comprehensive ROS was conducted with the patient and is negative except as per HPI   OBJECTIVE:  VS: BP 112/68   Pulse 70   Ht 4\' 7"  (1.397 m)   Wt 190 lb 12.8 oz (86.5 kg)   LMP 09/18/2023 (Approximate)   SpO2 97%   BMI 44.35 kg/m    Wt Readings from Last 3 Encounters:  10/16/23 190 lb 12.8 oz (86.5 kg)  10/07/23 190 lb (86.2 kg)  09/22/23 190 lb (86.2 kg)     EXAM: General: Pt appears well and is in NAD  Neck: General: Supple without adenopathy. Thyroid: Thyroid size normal.  No goiter or nodules appreciated.  Lungs: Clear with good BS bilat   Heart: Auscultation: RRR.  Abdomen: soft, nontender  Extremities:  BL LE: No pretibial edema normal   Mental Status: Judgment, insight: Intact Orientation: Oriented to time, place, and  person Mood and affect: No depression, anxiety, or agitation     DATA REVIEWED:  Latest Reference Range & Units 10/16/23 12:40  Potassium 3.5 - 5.3 mmol/L 4.1     CT Abdomen 06/21/2021  Narrative & Impression  CLINICAL DATA:  Abdominal pain, acute, nonlocalized. Left lower quadrant abdominal pain and umbilical pain.    FINDINGS: Lower chest: Lung bases are clear except for a 3 mm calcified granuloma in the posterior left lower lobe.   Hepatobiliary: Previous cholecystectomy. Liver parenchyma is normal.   Pancreas: Normal   Spleen: Normal   Adrenals/Urinary Tract: Adrenal glands are normal except for  a fat-density adenoma on the left measuring 13 mm. Contrast is present within the renal collecting systems, which could obscure small nonobstructing renal calculi. No hydronephrosis. No evidence of passing stone. No stone in the bladder.     Old records , labs and images have been reviewed.    ASSESSMENT/PLAN/RECOMMENDATIONS:   Left Adrenal Adenoma:   -She already had normal renin, Aldo, Aldo/renin ratio, metanephrines in March 2023, normal dexamethasone  suppression test 03/2022, normal aldo, renin, dexamethasone  suppression test 09/2022 -There is no clinical evidence of hypersecretion at this time -Patient will return for fasting 8 AM labs for dexamethasone  suppression test, Aldo, renin, and metanephrines - Potassium is normal  2.  Weight gain:  - I again encouraged the patient to follow a low calorie diet, I did advise her to either use my fitness pal or calorie Alene Husk - Encouraged exercise   Signed electronically by: Natale Bail, MD  [facility] Endocrinology  [facility] [address] [place] [place], [place] [address] Phone: [phone] FAX: [phone]   CC: Lawrance Presume, MD [address] [place] [address] [place] [address] Phone: [phone] Fax: [phone]   Return to Endocrinology clinic as below: Future Appointments  Date Time Provider Department Center  10/19/2023 10:30 AM [facility] PROCEDURES 1 [facility] GI-BRE[facility] CE  11/23/2023 11:30 AM [facility] LAB [facility] None  03/24/2024  3:50 PM Lawrance Presume, MD [facility] None

## 2023-10-19 ENCOUNTER — Other Ambulatory Visit: Payer: Self-pay | Admitting: Obstetrics and Gynecology

## 2023-10-19 ENCOUNTER — Ambulatory Visit
Admission: RE | Admit: 2023-10-19 | Discharge: 2023-10-19 | Disposition: A | Discharge status: Home / Self Care | Source: Ambulatory Visit | Attending: Obstetrics and Gynecology | Admitting: Obstetrics and Gynecology

## 2023-10-19 ENCOUNTER — Ambulatory Visit: Payer: Self-pay | Admitting: Internal Medicine

## 2023-10-19 DIAGNOSIS — N6320 Unspecified lump in the left breast, unspecified quadrant: Secondary | ICD-10-CM

## 2023-10-19 DIAGNOSIS — D3502 Benign neoplasm of left adrenal gland: Secondary | ICD-10-CM

## 2023-10-21 ENCOUNTER — Other Ambulatory Visit

## 2023-10-25 LAB — CATECHOLAMINES, FRACTIONATED, PLASMA
Dopamine: 33 pg/mL — ABNORMAL HIGH
Epinephrine: 24 pg/mL
Norepinephrine: 228 pg/mL
Total Catecholamines: 285 pg/mL

## 2023-10-25 LAB — POTASSIUM: Potassium: 4.1 mmol/L (ref 3.5–5.3)

## 2023-10-25 LAB — ALDOSTERONE + RENIN ACTIVITY W/ RATIO
ALDO / PRA Ratio: 5 ratio (ref 0.9–28.9)
Aldosterone: 9 ng/dL
Renin Activity: 1.8 ng/mL/h (ref 0.25–5.82)

## 2023-10-27 ENCOUNTER — Ambulatory Visit: Payer: Self-pay | Admitting: Obstetrics and Gynecology

## 2023-10-30 LAB — CORTISOL: Cortisol, Plasma: 0.8 ug/dL — ABNORMAL LOW

## 2023-10-30 LAB — DEXAMETHASONE, BLOOD: Dexamethasone, Serum: 455 ng/dL

## 2023-11-11 IMAGING — CT CT ABD-PELV W/ CM
2 of 4 series · 16 of 46 positions shown, 18 images · IV contrast (agent unspecified)
Comparison: 03/28/2020

CLINICAL DATA: Abdominal pain, acute, nonlocalized. Left lower
quadrant abdominal pain and umbilical pain.

EXAM:
CT ABDOMEN AND PELVIS WITH CONTRAST
TECHNIQUE: Multidetector CT imaging of the abdomen and pelvis was performed
using the standard protocol following bolus administration of
intravenous contrast.

[Series 3: a/p w/ 5mm · axial · 0.97mm/px · z∈[+847,+1312]mm · 13 of 103 slices shown, 15 images]
[im 5/103  soft-tissue]
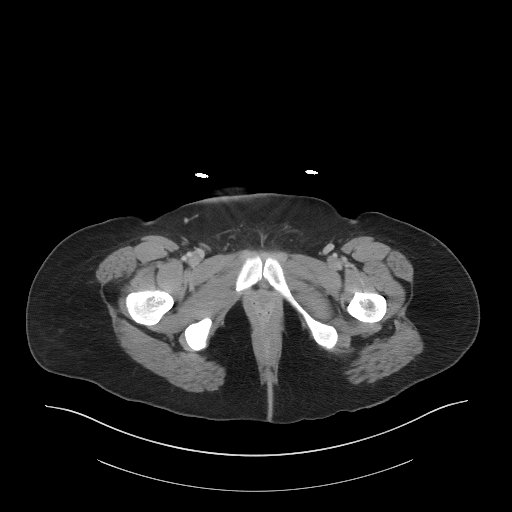
[im 5/103  bone]
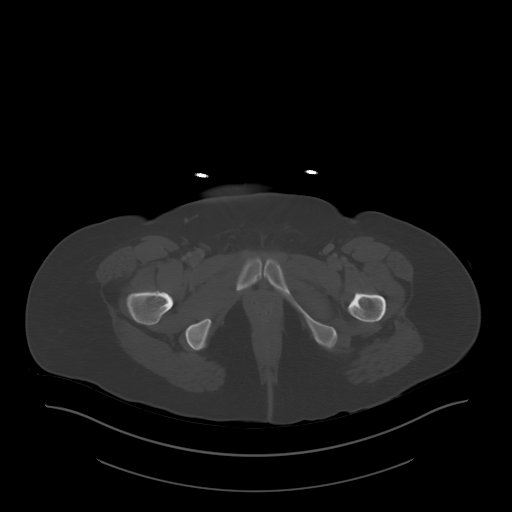
[im 14/103  soft-tissue]
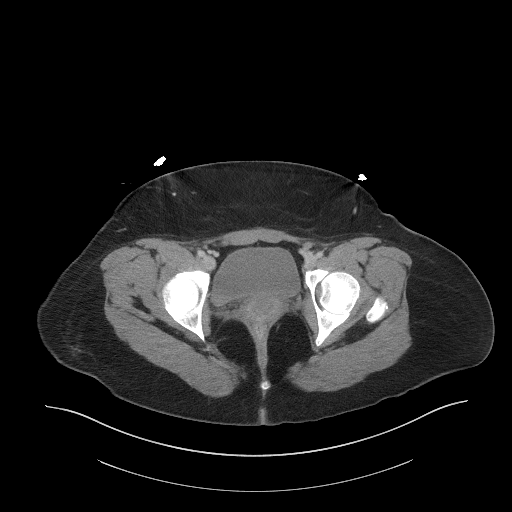
[im 24/103  soft-tissue]
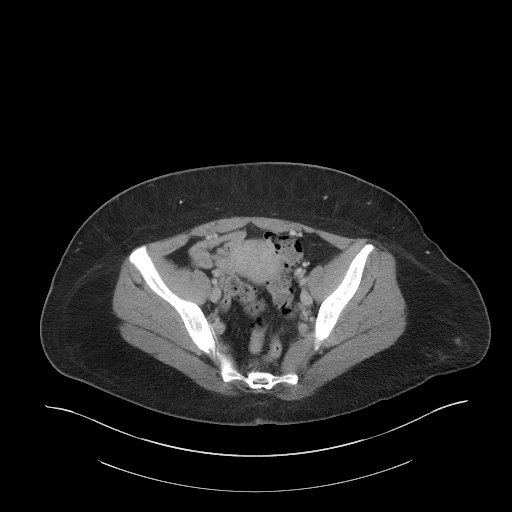
[im 28/103  soft-tissue]
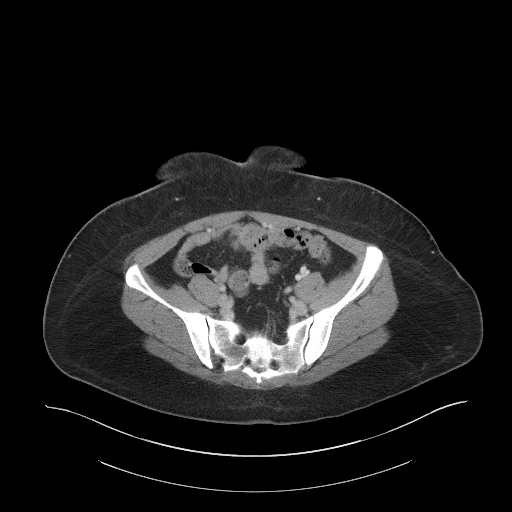
[im 38/103  soft-tissue]
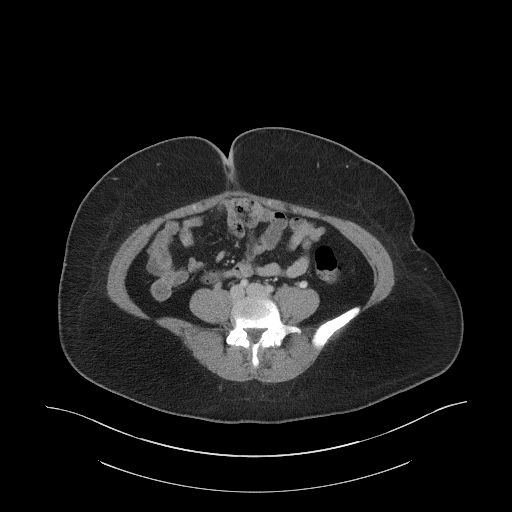
[im 42/103  soft-tissue]
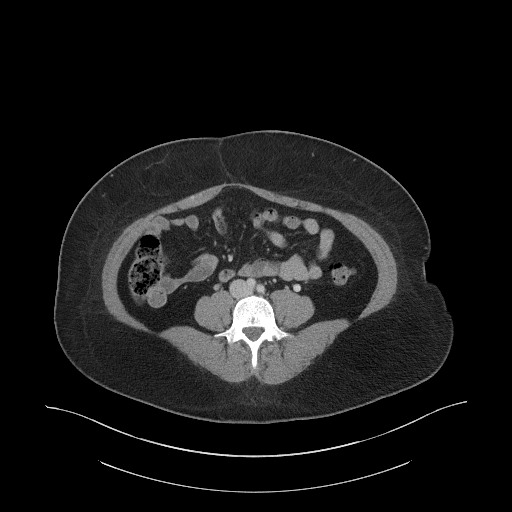
[im 52/103  soft-tissue]
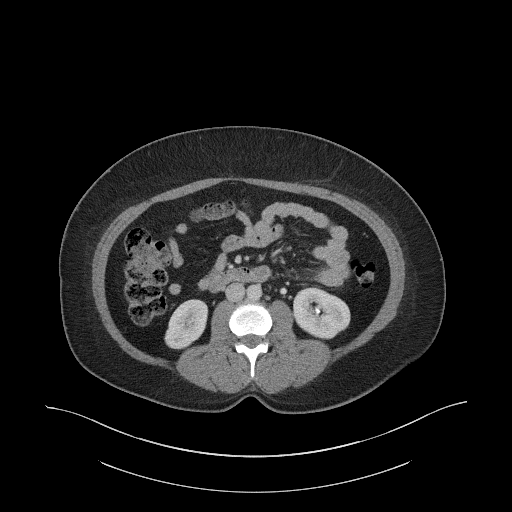
[im 61/103  soft-tissue]
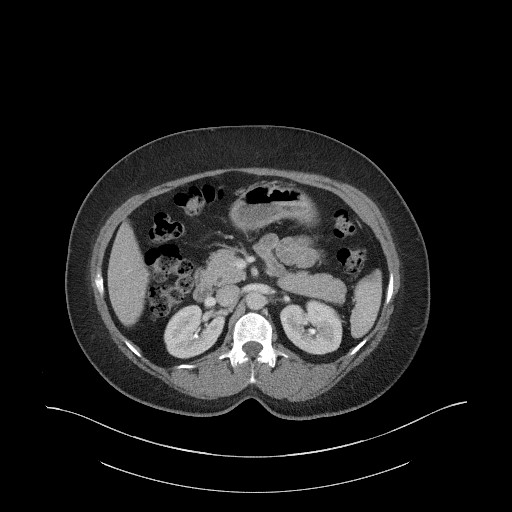
[im 65/103  soft-tissue]
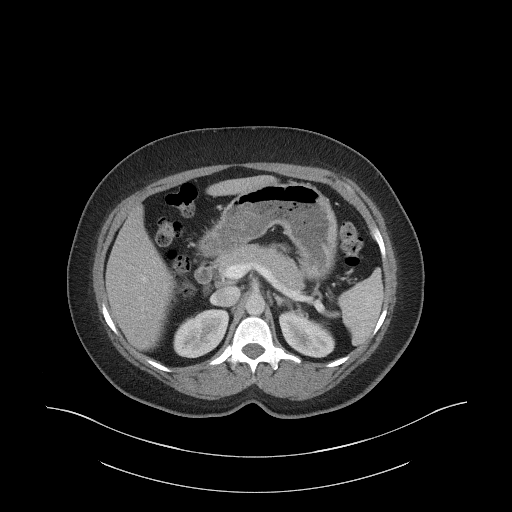
[im 65/103  bone]
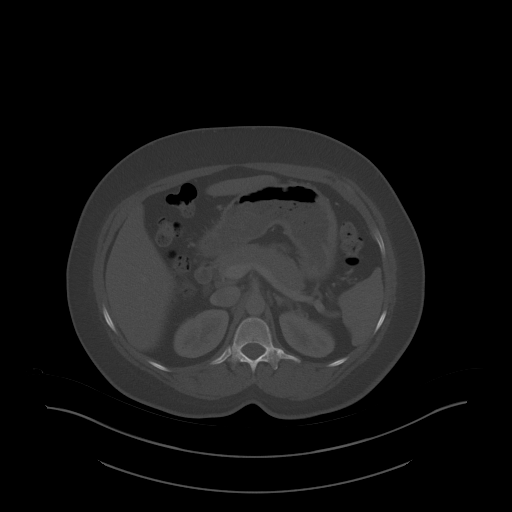
[im 75/103  soft-tissue]
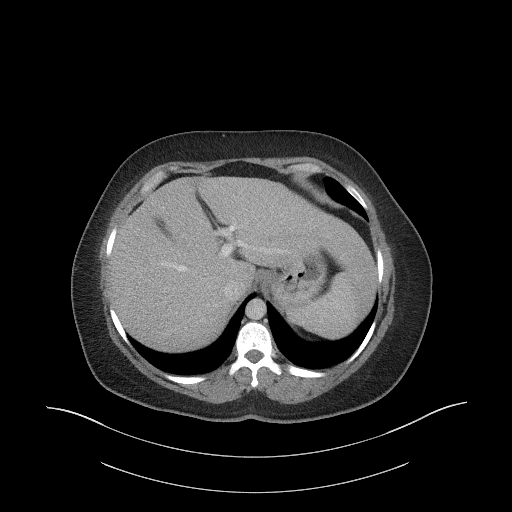
[im 79/103  soft-tissue]
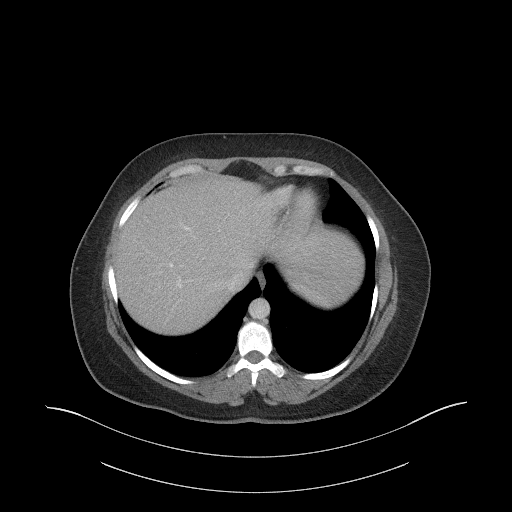
[im 89/103  soft-tissue]
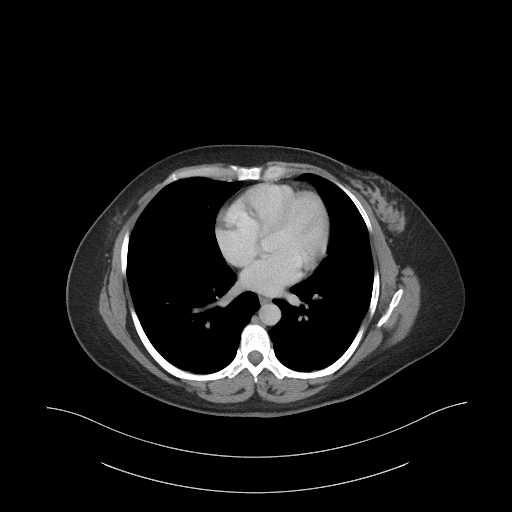
[im 98/103  soft-tissue]
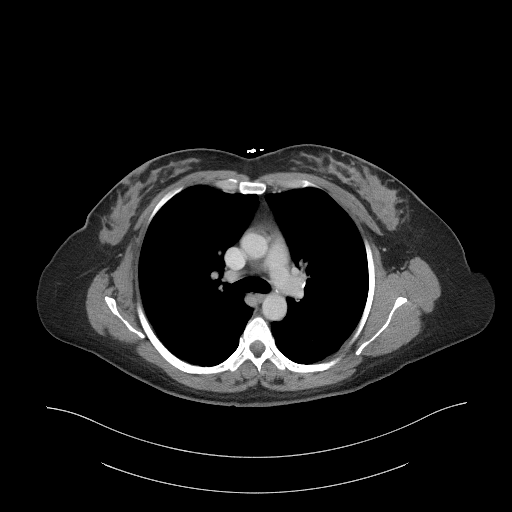

[Series 6: a/p w/ cor · coronal · 0.89mm/px · 3 of 155 slices shown]
[im 52/155  soft-tissue]
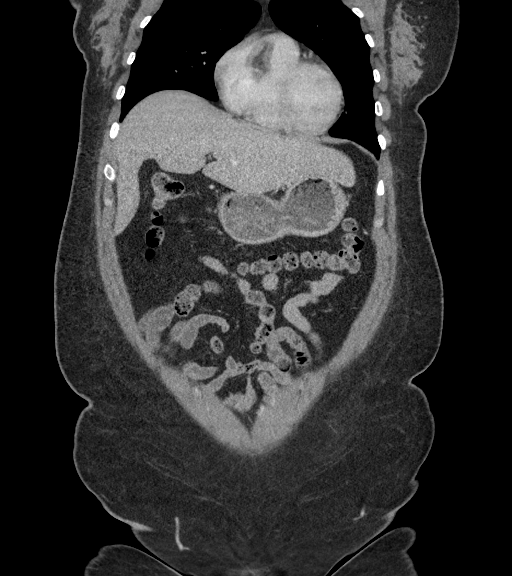
[im 69/155  soft-tissue]
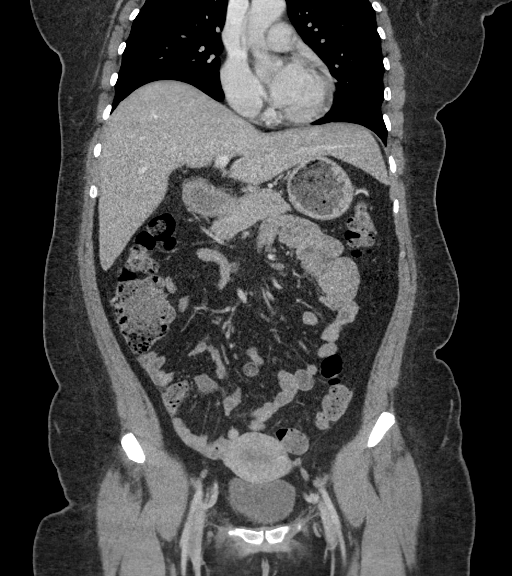
[im 86/155  soft-tissue]
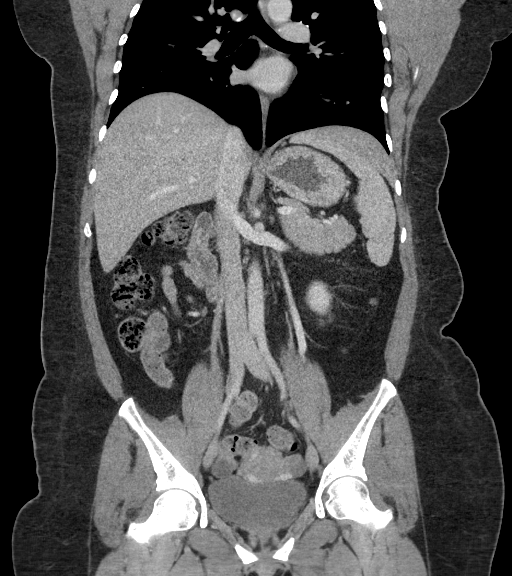

[16 of 46 positions shown; findings below may reference images not displayed]

RADIATION DOSE REDUCTION: This exam was performed according to the
departmental dose-optimization program which includes automated
exposure control, adjustment of the mA and/or kV according to
patient size and/or use of iterative reconstruction technique.

CONTRAST:  100mL OMNIPAQUE IOHEXOL 300 MG/ML  SOLN
FINDINGS: Lower chest: Lung bases are clear except for a 3 mm calcified
granuloma in the posterior left lower lobe.

Hepatobiliary: Previous cholecystectomy. Liver parenchyma is normal.

Pancreas: Normal

Spleen: Normal

Adrenals/Urinary Tract: Adrenal glands are normal except for a
fat-density adenoma on the left measuring 13 mm. Contrast is present
within the renal collecting systems, which could obscure small
nonobstructing renal calculi. No hydronephrosis. No evidence of
passing stone. No stone in the bladder.

Stomach/Bowel: Stomach and small intestine are normal. Normal
appendix. No visible colon abnormality.

Vascular/Lymphatic: Aorta and IVC are normal.  No adenopathy.

Reproductive: No pelvic mass.  Normal appearance of the ovaries.

Other: No free fluid or air.  No hernia.

Musculoskeletal: No abnormal musculoskeletal finding.
IMPRESSION: Negative examination. No abnormality seen to explain pain. No
evidence of bowel pathology including diverticulitis or
appendicitis. Previous cholecystectomy.

Incidental fat-density 13 mm left adrenal adenoma.

## 2023-11-23 ENCOUNTER — Ambulatory Visit: Payer: Self-pay | Attending: Internal Medicine

## 2023-11-23 DIAGNOSIS — D500 Iron deficiency anemia secondary to blood loss (chronic): Secondary | ICD-10-CM

## 2023-11-24 ENCOUNTER — Ambulatory Visit: Payer: Self-pay | Admitting: Internal Medicine

## 2023-11-24 LAB — IRON,TIBC AND FERRITIN PANEL
Ferritin: 49 ng/mL (ref 15–150)
Iron Saturation: 12 % — ABNORMAL LOW (ref 15–55)
Iron: 39 ug/dL (ref 27–159)
Total Iron Binding Capacity: 320 ug/dL (ref 250–450)
UIBC: 281 ug/dL (ref 131–425)

## 2023-11-24 LAB — CBC
Hematocrit: 42.1 % (ref 34.0–46.6)
Hemoglobin: 13.3 g/dL (ref 11.1–15.9)
MCH: 27.1 pg (ref 26.6–33.0)
MCHC: 31.6 g/dL (ref 31.5–35.7)
MCV: 86 fL (ref 79–97)
Platelets: 284 x10E3/uL (ref 150–450)
RBC: 4.9 x10E6/uL (ref 3.77–5.28)
RDW: 15.5 % — ABNORMAL HIGH (ref 11.7–15.4)
WBC: 9.2 x10E3/uL (ref 3.4–10.8)

## 2023-12-02 IMAGING — CT CT ORBITS W/ CM
3 of 4 series · 14 of 47 positions shown, 16 images · IV contrast (agent unspecified)
Comparison: Head CT without contrast 07/11/2021.

CLINICAL DATA: 40-year-old female with left eye swelling drainage
and pain progressive for 1-2 days.

EXAM:
CT ORBITS WITH CONTRAST
TECHNIQUE: Multidetector CT images was performed according to the standard
protocol following intravenous contrast administration.

[Series 3: facial/orbits w 2.0 st · axial · 0.32mm/px · z∈[-143,-61]mm · 8 of 53 slices shown, 10 images]
[im 6/53  brain]
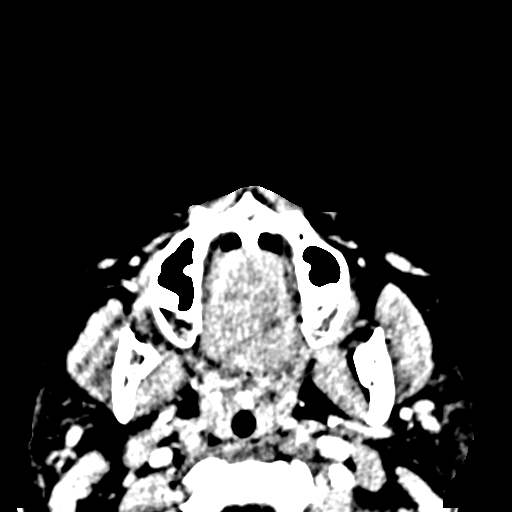
[im 6/53  bone]
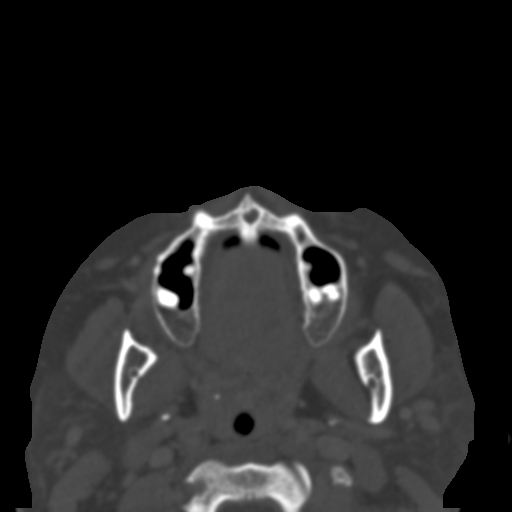
[im 11/53  bone]
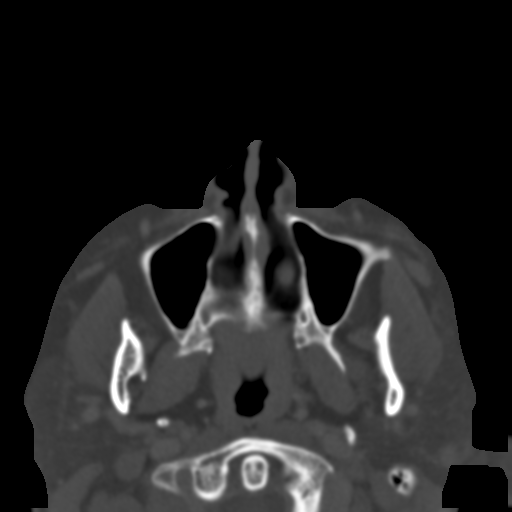
[im 16/53  bone]
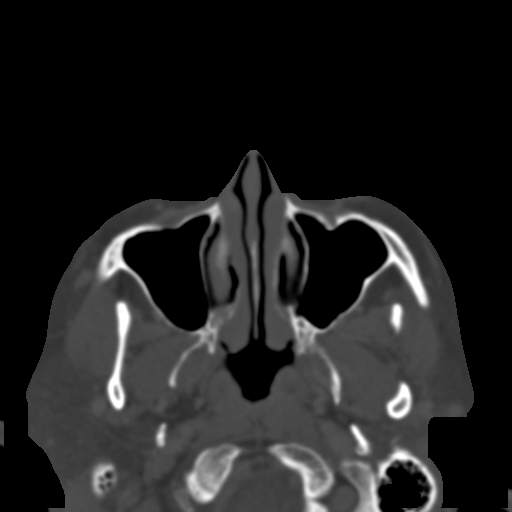
[im 24/53  bone]
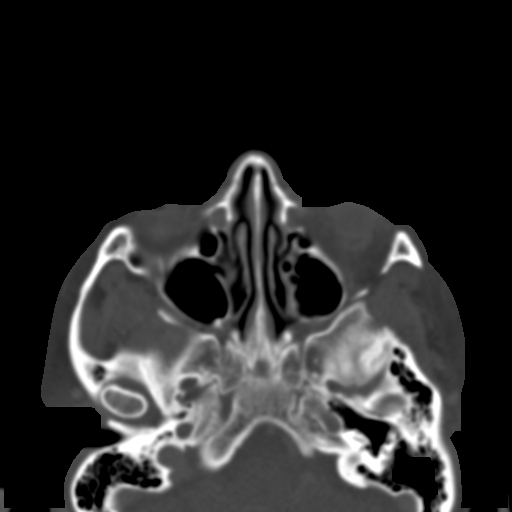
[im 29/53  brain]
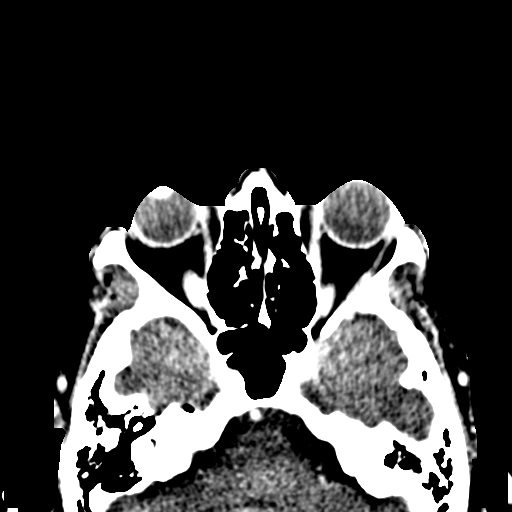
[im 29/53  bone]
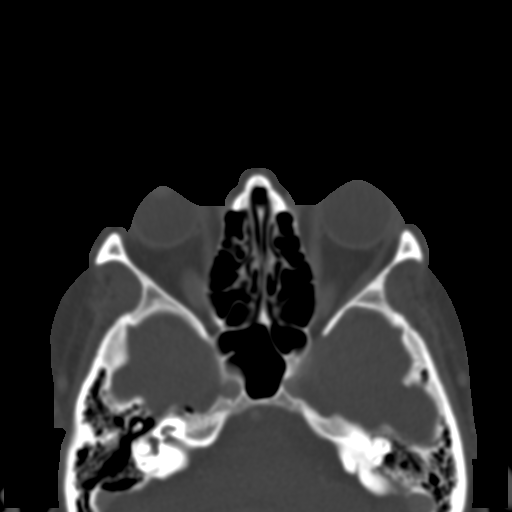
[im 37/53  bone]
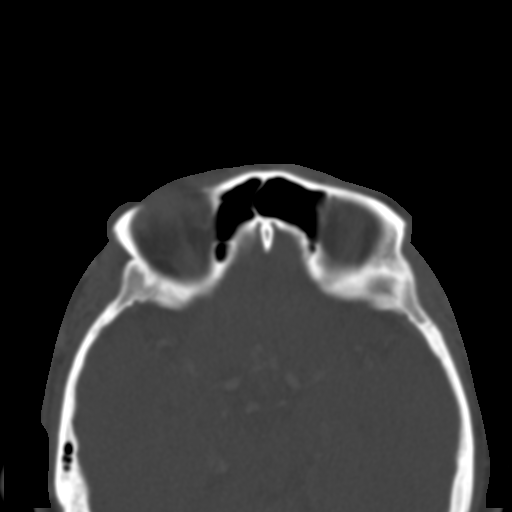
[im 42/53  bone]
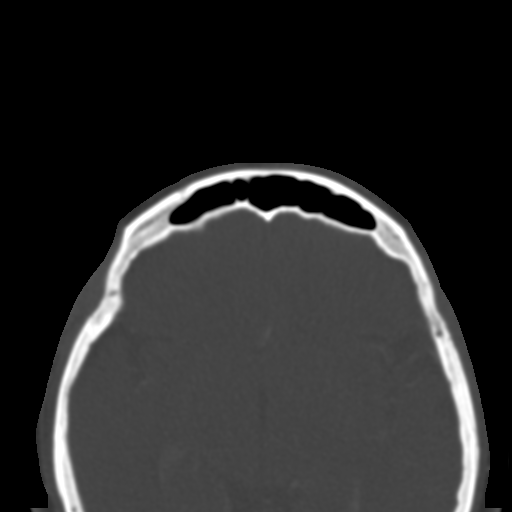
[im 47/53  bone]
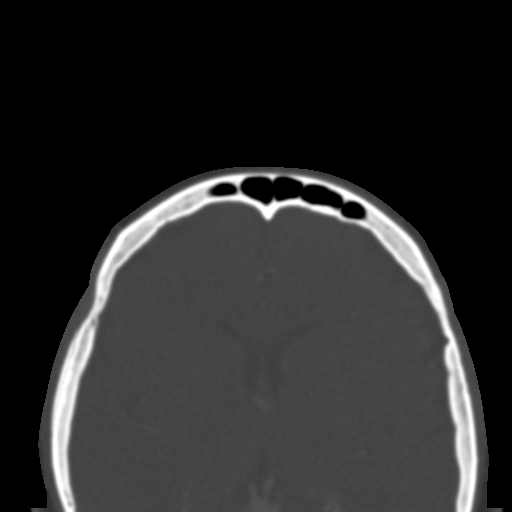

[Series 7: coronal soft tissue · coronal · 0.20mm/px · 3 of 51 slices shown]
[im 17/51  bone]
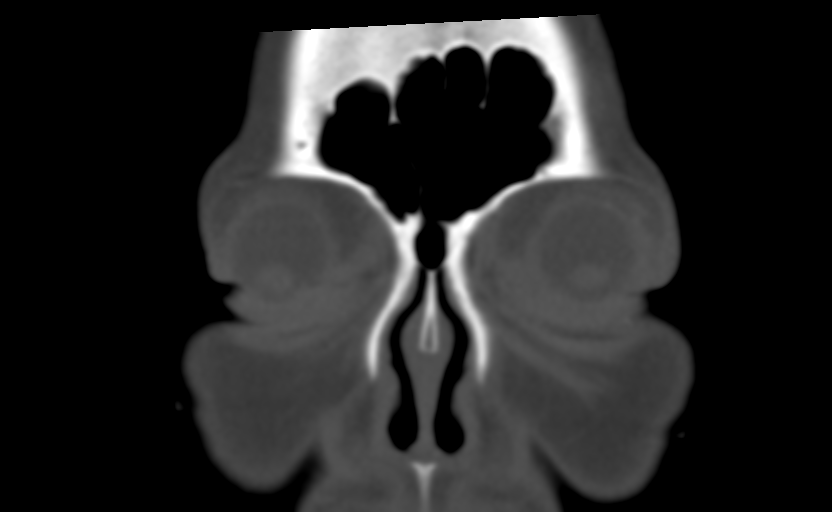
[im 23/51  bone]
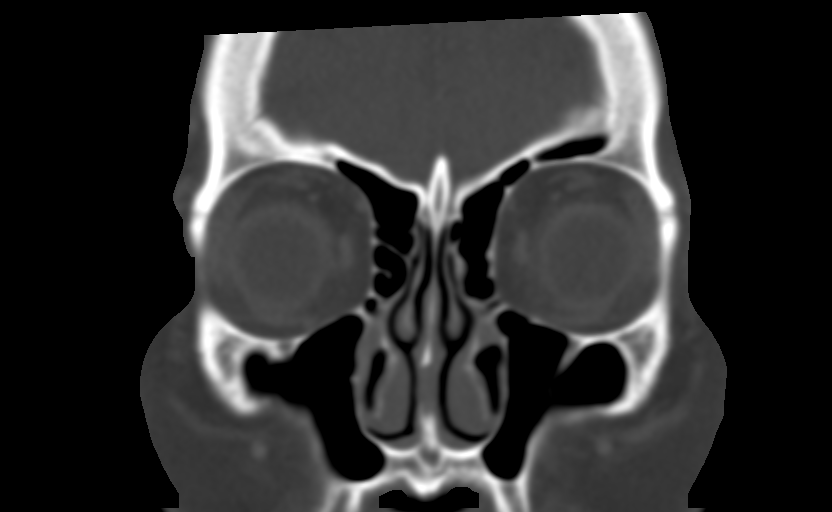
[im 28/51  bone]
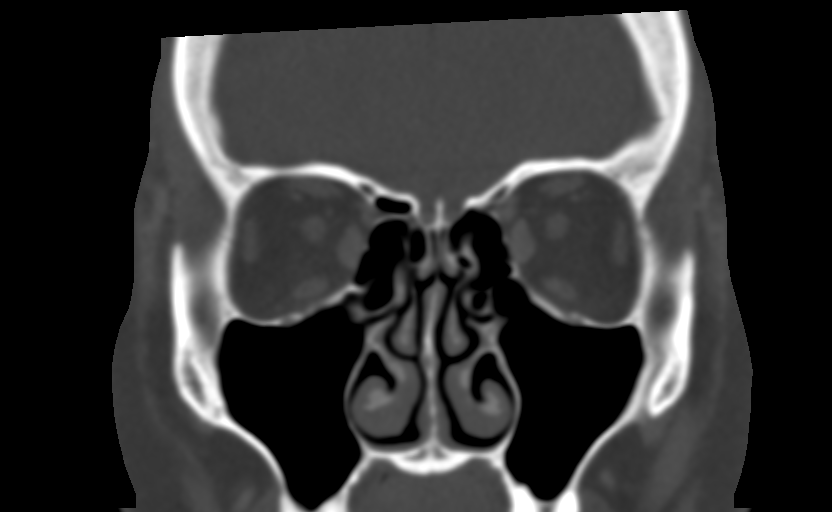

[Series 10: sagittal soft tissue · sagittal · 0.20mm/px · 3 of 80 slices shown]
[im 27/80  bone]
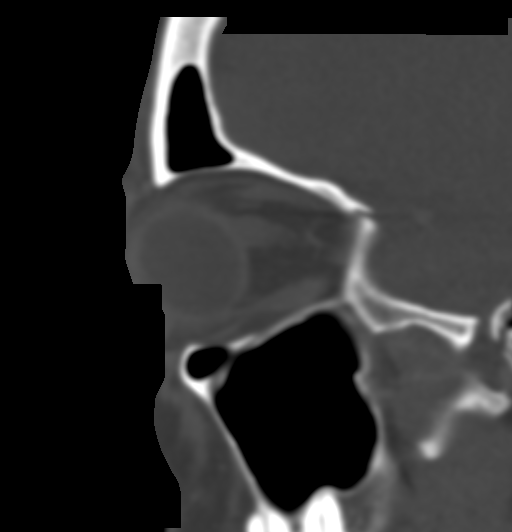
[im 40/80  bone]
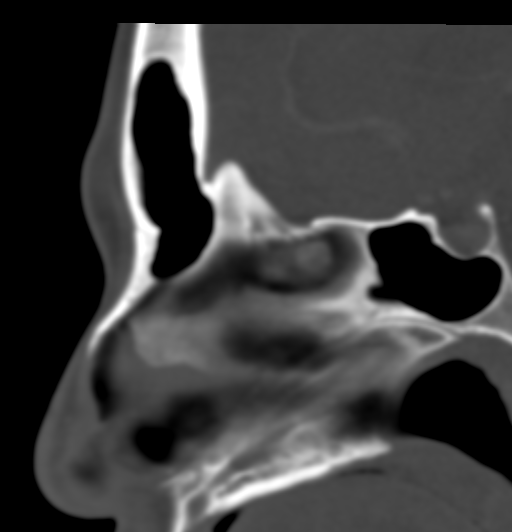
[im 53/80  bone]
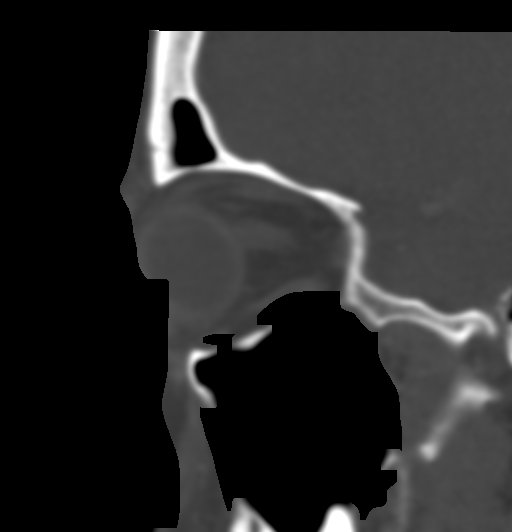

[14 of 47 positions shown; findings below may reference images not displayed]

RADIATION DOSE REDUCTION: This exam was performed according to the
departmental dose-optimization program which includes automated
exposure control, adjustment of the mA and/or kV according to
patient size and/or use of iterative reconstruction technique.

CONTRAST:  75mL OMNIPAQUE IOHEXOL 300 MG/ML  SOLN
FINDINGS: Orbits: Globes appear symmetric and intact. Intraorbital soft
tissues appears symmetric and normal. There is mild asymmetric left
orbit preseptal soft tissue thickening and stranding (series 3,
image 29) with no soft tissue gas or fluid collection. No postseptal
involvement. Right periorbital soft tissues appear normal.

Visible paranasal sinuses: Clear. Tympanic cavities and mastoids are
clear.

Soft tissues: Visualized scalp soft tissues are within normal
limits. Negative visible deep soft tissue spaces of the face,
including the masticator, carotid, parapharyngeal spaces. Visible
major vascular structures at the skull base are enhancing and appear
to be patent, including the cavernous sinus.

Osseous: Intact orbital walls. Bone mineralization is within normal
limits. No acute osseous abnormality identified.

Limited intracranial: Negative.  No abnormal enhancement identified.
IMPRESSION: 1. Left preseptal soft tissue thickening and inflammation compatible
with Preseptal Cellulitis. No postseptal involvement, abscess, or
other complicating features.

2. Otherwise negative Orbit CT

## 2024-03-21 ENCOUNTER — Ambulatory Visit
Admission: RE | Admit: 2024-03-21 | Discharge: 2024-03-21 | Disposition: A | Discharge status: Home / Self Care | Source: Ambulatory Visit | Attending: Family Medicine | Admitting: Family Medicine

## 2024-03-21 ENCOUNTER — Other Ambulatory Visit: Payer: Self-pay

## 2024-03-21 ENCOUNTER — Ambulatory Visit: Payer: Self-pay

## 2024-03-21 VITALS — BP 111/63 | HR 77 | Temp 98.6°F | Resp 17

## 2024-03-21 DIAGNOSIS — R519 Headache, unspecified: Secondary | ICD-10-CM

## 2024-03-21 MED ORDER — KETOROLAC TROMETHAMINE 10 MG PO TABS
10.0000 mg | ORAL_TABLET | Freq: Three times a day (TID) | ORAL | 0 refills | Status: AC | PRN
  Filled 2024-03-21: qty 5, 2d supply, fill #0

## 2024-03-21 MED ORDER — KETOROLAC TROMETHAMINE 30 MG/ML IJ SOLN
30.0000 mg | Freq: Once | INTRAMUSCULAR | Status: AC
  Administered 2024-03-21: 30 mg via INTRAMUSCULAR
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
## 2024-03-21 NOTE — Discharge Instructions (Addendum)
 You were given a Toradol  injection in clinic today. Do not take any over the counter NSAID's such as Advil , ibuprofen , Aleve , or naproxen  for 24 hours. You may take tylenol  if needed You may start Toradol  every 8 hours as needed for your headache tomorrow, 11/11.  Please follow-up with your PCP if your symptoms do not improve.  Please go to the emergency room for any worsening symptoms.  I hope you feel better soon!

## 2024-03-21 NOTE — ED Triage Notes (Addendum)
 error
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
## 2024-03-23 ENCOUNTER — Telehealth: Payer: Self-pay | Admitting: Internal Medicine

## 2024-03-23 NOTE — Telephone Encounter (Signed)
 Confirmed appt for 11/13

## 2024-03-24 ENCOUNTER — Other Ambulatory Visit: Payer: Self-pay

## 2024-03-24 ENCOUNTER — Encounter: Payer: Self-pay | Admitting: Internal Medicine

## 2024-03-24 ENCOUNTER — Ambulatory Visit: Payer: Self-pay | Attending: Internal Medicine | Admitting: Internal Medicine

## 2024-03-24 VITALS — BP 111/74 | HR 74 | Temp 97.9°F | Ht <= 58 in | Wt 188.0 lb

## 2024-03-24 DIAGNOSIS — D3502 Benign neoplasm of left adrenal gland: Secondary | ICD-10-CM

## 2024-03-24 DIAGNOSIS — N921 Excessive and frequent menstruation with irregular cycle: Secondary | ICD-10-CM

## 2024-03-24 DIAGNOSIS — Z23 Encounter for immunization: Secondary | ICD-10-CM

## 2024-03-24 DIAGNOSIS — D500 Iron deficiency anemia secondary to blood loss (chronic): Secondary | ICD-10-CM

## 2024-03-24 DIAGNOSIS — Z6841 Body Mass Index (BMI) 40.0 and over, adult: Secondary | ICD-10-CM

## 2024-03-24 MED ORDER — FERROUS SULFATE 325 (65 FE) MG PO TABS
325.0000 mg | ORAL_TABLET | Freq: Every day | ORAL | 1 refills | Status: AC
  Filled 2024-03-24: qty 100, 100d supply, fill #0
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
## 2024-03-25 ENCOUNTER — Ambulatory Visit: Payer: Self-pay | Admitting: Internal Medicine

## 2024-03-25 LAB — FSH/LH
FSH: 6.2 m[IU]/mL
LH: 7 m[IU]/mL

## 2024-03-25 LAB — CBC
Hematocrit: 34.5 % (ref 34.0–46.6)
Hemoglobin: 10.9 g/dL — ABNORMAL LOW (ref 11.1–15.9)
MCH: 25.8 pg — ABNORMAL LOW (ref 26.6–33.0)
MCHC: 31.6 g/dL (ref 31.5–35.7)
MCV: 82 fL (ref 79–97)
Platelets: 336 x10E3/uL (ref 150–450)
RBC: 4.22 x10E6/uL (ref 3.77–5.28)
RDW: 13.7 % (ref 11.7–15.4)
WBC: 9.8 x10E3/uL (ref 3.4–10.8)

## 2024-03-25 LAB — IRON,TIBC AND FERRITIN PANEL
Ferritin: 28 ng/mL (ref 15–150)
Iron Saturation: 8 % — CL (ref 15–55)
Iron: 29 ug/dL (ref 27–159)
Total Iron Binding Capacity: 344 ug/dL (ref 250–450)
UIBC: 315 ug/dL (ref 131–425)

## 2024-03-25 LAB — TSH+T4F+T3FREE
Free T4: 0.96 ng/dL (ref 0.82–1.77)
T3, Free: 2.8 pg/mL (ref 2.0–4.4)
TSH: 2.28 u[IU]/mL (ref 0.450–4.500)

## 2024-03-30 NOTE — Addendum Note (Signed)
 Addended by: VICCI SOBER B on: 03/30/2024 01:18 PM   Modules accepted: Orders

## 2024-03-31 ENCOUNTER — Ambulatory Visit (HOSPITAL_COMMUNITY): Payer: Self-pay

## 2024-04-01 LAB — SPECIMEN STATUS REPORT

## 2024-04-01 LAB — HGB A1C W/O EAG: Hgb A1c MFr Bld: 5.4 % (ref 4.8–5.6)

## 2024-04-02 ENCOUNTER — Other Ambulatory Visit: Payer: Self-pay

## 2024-04-02 ENCOUNTER — Encounter (HOSPITAL_COMMUNITY): Payer: Self-pay | Admitting: Radiology

## 2024-04-02 DIAGNOSIS — S2002XA Contusion of left breast, initial encounter: Secondary | ICD-10-CM | POA: Insufficient documentation

## 2024-04-02 DIAGNOSIS — Y92410 Unspecified street and highway as the place of occurrence of the external cause: Secondary | ICD-10-CM | POA: Diagnosis not present

## 2024-04-02 DIAGNOSIS — R0789 Other chest pain: Secondary | ICD-10-CM | POA: Diagnosis present
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
## 2024-04-03 ENCOUNTER — Emergency Department (HOSPITAL_COMMUNITY)
Admission: EM | Admit: 2024-04-03 | Discharge: 2024-04-03 | Disposition: A | Discharge status: Home / Self Care | Attending: Emergency Medicine | Admitting: Emergency Medicine

## 2024-04-03 ENCOUNTER — Emergency Department (HOSPITAL_COMMUNITY)

## 2024-04-03 DIAGNOSIS — S2002XA Contusion of left breast, initial encounter: Secondary | ICD-10-CM

## 2024-04-03 MED ORDER — CYCLOBENZAPRINE HCL 5 MG PO TABS: 5.0000 mg | ORAL_TABLET | Freq: Three times a day (TID) | ORAL | 0 refills | Status: AC | PRN

## 2024-04-03 MED ORDER — HYDROCODONE-ACETAMINOPHEN 5-325 MG PO TABS
1.0000 | ORAL_TABLET | Freq: Once | ORAL | Status: AC
  Administered 2024-04-03: 1 via ORAL
  Filled 2024-04-03: qty 1

## 2024-04-03 NOTE — Discharge Instructions (Addendum)
 Continue to take Tylenol  every 6 hours as needed for pain.  May take Flexeril  3 times a day to help with any pain/muscle spasms.  Apply heat pads to the area to help with pain as well.  Please follow-up with PCP in 1 to 2 weeks if no improvement.  Also follow-up for mammogram as scheduled in December.  Return to ED if any symptoms worsen including severe redness to the area, chest pain, difficulty breathing.

## 2024-04-03 NOTE — ED Notes (Signed)
 Pt called X2 for vitals. Pt could not be found.
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
## 2024-04-19 ENCOUNTER — Other Ambulatory Visit: Payer: Self-pay | Admitting: Obstetrics and Gynecology

## 2024-04-19 ENCOUNTER — Inpatient Hospital Stay
Admission: RE | Admit: 2024-04-19 | Discharge: 2024-04-19 | Attending: Obstetrics and Gynecology | Admitting: Obstetrics and Gynecology

## 2024-04-19 ENCOUNTER — Ambulatory Visit
Admission: RE | Admit: 2024-04-19 | Discharge: 2024-04-19 | Disposition: A | Source: Ambulatory Visit | Attending: Obstetrics and Gynecology | Admitting: Obstetrics and Gynecology

## 2024-04-19 DIAGNOSIS — N6320 Unspecified lump in the left breast, unspecified quadrant: Secondary | ICD-10-CM

## 2024-07-22 ENCOUNTER — Ambulatory Visit: Admitting: Internal Medicine

## 2024-10-17 ENCOUNTER — Ambulatory Visit: Payer: Self-pay | Admitting: Internal Medicine
